# Patient Record
Sex: Female | Born: 1980
Health system: Southern US, Community
[De-identification: ages and names within clinical notes are randomized; demographics above are authoritative.]

## PROBLEM LIST (undated history)

## (undated) ENCOUNTER — Inpatient Hospital Stay (HOSPITAL_COMMUNITY): Payer: Medicare Other

## (undated) ENCOUNTER — Inpatient Hospital Stay (HOSPITAL_COMMUNITY): Payer: Self-pay

## (undated) DIAGNOSIS — N63 Unspecified lump in unspecified breast: Secondary | ICD-10-CM

## (undated) DIAGNOSIS — L732 Hidradenitis suppurativa: Secondary | ICD-10-CM

## (undated) DIAGNOSIS — N920 Excessive and frequent menstruation with regular cycle: Secondary | ICD-10-CM

## (undated) DIAGNOSIS — D649 Anemia, unspecified: Secondary | ICD-10-CM

## (undated) DIAGNOSIS — D219 Benign neoplasm of connective and other soft tissue, unspecified: Secondary | ICD-10-CM

## (undated) DIAGNOSIS — H8110 Benign paroxysmal vertigo, unspecified ear: Secondary | ICD-10-CM

## (undated) DIAGNOSIS — L852 Keratosis punctata (palmaris et plantaris): Secondary | ICD-10-CM

## (undated) DIAGNOSIS — L0212 Furuncle of neck: Secondary | ICD-10-CM

## (undated) HISTORY — PX: AXILLARY HIDRADENITIS EXCISION: SUR522

## (undated) HISTORY — DX: Benign neoplasm of connective and other soft tissue, unspecified: D21.9

## (undated) HISTORY — DX: Benign paroxysmal vertigo, unspecified ear: H81.10

## (undated) HISTORY — DX: Hidradenitis suppurativa: L73.2

## (undated) HISTORY — DX: Furuncle of neck: L02.12

## (undated) HISTORY — DX: Keratosis punctata (palmaris et plantaris): L85.2

## (undated) HISTORY — PX: TUBAL LIGATION: SHX77

---
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
## 1999-01-08 ENCOUNTER — Ambulatory Visit (HOSPITAL_COMMUNITY): Admission: RE | Admit: 1999-01-08 | Discharge: 1999-01-08 | Payer: Self-pay | Admitting: *Deleted

## 1999-02-23 ENCOUNTER — Other Ambulatory Visit: Admission: RE | Admit: 1999-02-23 | Discharge: 1999-02-23 | Payer: Self-pay | Admitting: Obstetrics

## 1999-02-24 ENCOUNTER — Inpatient Hospital Stay (HOSPITAL_COMMUNITY): Admission: AD | Admit: 1999-02-24 | Discharge: 1999-02-24 | Payer: Self-pay | Admitting: Obstetrics

## 1999-05-11 ENCOUNTER — Inpatient Hospital Stay (HOSPITAL_COMMUNITY): Admission: AD | Admit: 1999-05-11 | Discharge: 1999-05-13 | Payer: Self-pay | Admitting: *Deleted

## 1999-06-20 ENCOUNTER — Inpatient Hospital Stay (HOSPITAL_COMMUNITY): Admission: AD | Admit: 1999-06-20 | Discharge: 1999-06-20 | Payer: Self-pay | Admitting: *Deleted

## 2000-04-18 ENCOUNTER — Emergency Department (HOSPITAL_COMMUNITY): Admission: EM | Admit: 2000-04-18 | Discharge: 2000-04-18 | Payer: Self-pay | Admitting: Emergency Medicine

## 2001-01-30 ENCOUNTER — Emergency Department (HOSPITAL_COMMUNITY): Admission: EM | Admit: 2001-01-30 | Discharge: 2001-01-30 | Payer: Self-pay | Admitting: Emergency Medicine

## 2001-02-01 ENCOUNTER — Emergency Department (HOSPITAL_COMMUNITY): Admission: EM | Admit: 2001-02-01 | Discharge: 2001-02-01 | Payer: Self-pay | Admitting: Emergency Medicine

## 2001-02-14 ENCOUNTER — Inpatient Hospital Stay (HOSPITAL_COMMUNITY): Admission: AD | Admit: 2001-02-14 | Discharge: 2001-02-14 | Payer: Self-pay | Admitting: *Deleted

## 2001-03-27 ENCOUNTER — Inpatient Hospital Stay (HOSPITAL_COMMUNITY): Admission: AD | Admit: 2001-03-27 | Discharge: 2001-03-27 | Payer: Self-pay | Admitting: Obstetrics

## 2001-03-29 ENCOUNTER — Inpatient Hospital Stay (HOSPITAL_COMMUNITY): Admission: AD | Admit: 2001-03-29 | Discharge: 2001-03-29 | Payer: Self-pay | Admitting: Obstetrics

## 2001-04-11 ENCOUNTER — Inpatient Hospital Stay (HOSPITAL_COMMUNITY): Admission: AD | Admit: 2001-04-11 | Discharge: 2001-04-11 | Payer: Self-pay | Admitting: *Deleted

## 2001-05-02 ENCOUNTER — Encounter: Admission: RE | Admit: 2001-05-02 | Discharge: 2001-05-02 | Payer: Self-pay | Admitting: Obstetrics & Gynecology

## 2001-08-23 ENCOUNTER — Inpatient Hospital Stay (HOSPITAL_COMMUNITY): Admission: AD | Admit: 2001-08-23 | Discharge: 2001-08-23 | Payer: Self-pay | Admitting: Obstetrics and Gynecology

## 2001-08-29 ENCOUNTER — Inpatient Hospital Stay (HOSPITAL_COMMUNITY): Admission: AD | Admit: 2001-08-29 | Discharge: 2001-08-29 | Payer: Self-pay | Admitting: *Deleted

## 2001-09-13 ENCOUNTER — Inpatient Hospital Stay (HOSPITAL_COMMUNITY): Admission: AD | Admit: 2001-09-13 | Discharge: 2001-09-13 | Payer: Self-pay | Admitting: Obstetrics and Gynecology

## 2002-05-07 ENCOUNTER — Other Ambulatory Visit: Admission: RE | Admit: 2002-05-07 | Discharge: 2002-05-07 | Payer: Self-pay | Admitting: Family Medicine

## 2002-11-28 ENCOUNTER — Inpatient Hospital Stay (HOSPITAL_COMMUNITY): Admission: AD | Admit: 2002-11-28 | Discharge: 2002-11-28 | Payer: Self-pay | Admitting: Obstetrics and Gynecology

## 2003-07-03 ENCOUNTER — Emergency Department (HOSPITAL_COMMUNITY): Admission: EM | Admit: 2003-07-03 | Discharge: 2003-07-03 | Payer: Self-pay | Admitting: Emergency Medicine

## 2003-07-25 ENCOUNTER — Other Ambulatory Visit: Admission: RE | Admit: 2003-07-25 | Discharge: 2003-07-25 | Payer: Self-pay | Admitting: Family Medicine

## 2003-09-01 ENCOUNTER — Emergency Department (HOSPITAL_COMMUNITY): Admission: EM | Admit: 2003-09-01 | Discharge: 2003-09-01 | Payer: Self-pay | Admitting: Emergency Medicine

## 2003-09-24 ENCOUNTER — Encounter (INDEPENDENT_AMBULATORY_CARE_PROVIDER_SITE_OTHER): Payer: Self-pay | Admitting: Specialist

## 2003-09-24 ENCOUNTER — Other Ambulatory Visit: Admission: RE | Admit: 2003-09-24 | Discharge: 2003-09-24 | Payer: Self-pay | Admitting: Obstetrics & Gynecology

## 2003-09-24 ENCOUNTER — Encounter (INDEPENDENT_AMBULATORY_CARE_PROVIDER_SITE_OTHER): Payer: Self-pay | Admitting: *Deleted

## 2003-09-24 ENCOUNTER — Encounter: Admission: RE | Admit: 2003-09-24 | Discharge: 2003-09-24 | Payer: Self-pay | Admitting: Obstetrics and Gynecology

## 2004-02-09 ENCOUNTER — Inpatient Hospital Stay (HOSPITAL_COMMUNITY): Admission: AD | Admit: 2004-02-09 | Discharge: 2004-02-09 | Payer: Self-pay | Admitting: Family Medicine

## 2004-02-16 ENCOUNTER — Inpatient Hospital Stay (HOSPITAL_COMMUNITY): Admission: AD | Admit: 2004-02-16 | Discharge: 2004-02-16 | Payer: Self-pay | Admitting: Family Medicine

## 2004-02-24 ENCOUNTER — Inpatient Hospital Stay (HOSPITAL_COMMUNITY): Admission: AD | Admit: 2004-02-24 | Discharge: 2004-02-24 | Payer: Self-pay | Admitting: Obstetrics & Gynecology

## 2004-09-22 ENCOUNTER — Ambulatory Visit: Payer: Self-pay | Admitting: Obstetrics and Gynecology

## 2004-10-08 ENCOUNTER — Inpatient Hospital Stay (HOSPITAL_COMMUNITY): Admission: AD | Admit: 2004-10-08 | Discharge: 2004-10-09 | Payer: Self-pay | Admitting: Obstetrics & Gynecology

## 2004-11-20 ENCOUNTER — Emergency Department (HOSPITAL_COMMUNITY): Admission: EM | Admit: 2004-11-20 | Discharge: 2004-11-20 | Payer: Self-pay | Admitting: Emergency Medicine

## 2004-11-22 ENCOUNTER — Emergency Department (HOSPITAL_COMMUNITY): Admission: EM | Admit: 2004-11-22 | Discharge: 2004-11-22 | Payer: Self-pay | Admitting: Family Medicine

## 2004-12-16 ENCOUNTER — Ambulatory Visit: Payer: Self-pay | Admitting: Family Medicine

## 2004-12-19 ENCOUNTER — Emergency Department (HOSPITAL_COMMUNITY): Admission: EM | Admit: 2004-12-19 | Discharge: 2004-12-19 | Payer: Self-pay | Admitting: Family Medicine

## 2005-01-04 ENCOUNTER — Emergency Department (HOSPITAL_COMMUNITY): Admission: EM | Admit: 2005-01-04 | Discharge: 2005-01-04 | Payer: Self-pay | Admitting: Family Medicine

## 2005-04-11 ENCOUNTER — Emergency Department (HOSPITAL_COMMUNITY): Admission: EM | Admit: 2005-04-11 | Discharge: 2005-04-11 | Payer: Self-pay | Admitting: Family Medicine

## 2005-04-15 ENCOUNTER — Emergency Department (HOSPITAL_COMMUNITY): Admission: EM | Admit: 2005-04-15 | Discharge: 2005-04-15 | Payer: Self-pay | Admitting: Emergency Medicine

## 2005-04-16 ENCOUNTER — Emergency Department (HOSPITAL_COMMUNITY): Admission: EM | Admit: 2005-04-16 | Discharge: 2005-04-16 | Payer: Self-pay | Admitting: Emergency Medicine

## 2005-04-18 ENCOUNTER — Emergency Department (HOSPITAL_COMMUNITY): Admission: EM | Admit: 2005-04-18 | Discharge: 2005-04-18 | Payer: Self-pay | Admitting: Emergency Medicine

## 2005-06-22 ENCOUNTER — Ambulatory Visit: Payer: Self-pay | Admitting: Family Medicine

## 2005-09-16 ENCOUNTER — Inpatient Hospital Stay (HOSPITAL_COMMUNITY): Admission: AD | Admit: 2005-09-16 | Discharge: 2005-09-16 | Payer: Self-pay | Admitting: Family Medicine

## 2006-01-26 ENCOUNTER — Ambulatory Visit: Payer: Self-pay | Admitting: Obstetrics & Gynecology

## 2006-02-11 ENCOUNTER — Emergency Department (HOSPITAL_COMMUNITY): Admission: EM | Admit: 2006-02-11 | Discharge: 2006-02-11 | Payer: Self-pay | Admitting: Family Medicine

## 2006-03-30 ENCOUNTER — Ambulatory Visit: Payer: Self-pay | Admitting: Obstetrics and Gynecology

## 2006-04-12 ENCOUNTER — Ambulatory Visit: Payer: Self-pay | Admitting: Family Medicine

## 2006-04-17 ENCOUNTER — Emergency Department (HOSPITAL_COMMUNITY): Admission: EM | Admit: 2006-04-17 | Discharge: 2006-04-17 | Payer: Self-pay | Admitting: Emergency Medicine

## 2006-04-29 ENCOUNTER — Emergency Department (HOSPITAL_COMMUNITY): Admission: EM | Admit: 2006-04-29 | Discharge: 2006-04-29 | Payer: Self-pay | Admitting: Family Medicine

## 2006-07-14 ENCOUNTER — Emergency Department (HOSPITAL_COMMUNITY): Admission: EM | Admit: 2006-07-14 | Discharge: 2006-07-14 | Payer: Self-pay | Admitting: Family Medicine

## 2006-08-04 ENCOUNTER — Inpatient Hospital Stay (HOSPITAL_COMMUNITY): Admission: AD | Admit: 2006-08-04 | Discharge: 2006-08-04 | Payer: Self-pay | Admitting: Obstetrics & Gynecology

## 2006-11-24 ENCOUNTER — Emergency Department (HOSPITAL_COMMUNITY): Admission: EM | Admit: 2006-11-24 | Discharge: 2006-11-24 | Payer: Self-pay | Admitting: Emergency Medicine

## 2006-11-29 ENCOUNTER — Emergency Department (HOSPITAL_COMMUNITY): Admission: EM | Admit: 2006-11-29 | Discharge: 2006-11-29 | Payer: Self-pay | Admitting: Emergency Medicine

## 2006-12-05 ENCOUNTER — Emergency Department (HOSPITAL_COMMUNITY): Admission: EM | Admit: 2006-12-05 | Discharge: 2006-12-05 | Payer: Self-pay | Admitting: Emergency Medicine

## 2006-12-28 ENCOUNTER — Emergency Department (HOSPITAL_COMMUNITY): Admission: EM | Admit: 2006-12-28 | Discharge: 2006-12-29 | Payer: Self-pay | Admitting: Emergency Medicine

## 2007-01-23 ENCOUNTER — Encounter (INDEPENDENT_AMBULATORY_CARE_PROVIDER_SITE_OTHER): Payer: Self-pay | Admitting: General Surgery

## 2007-01-23 ENCOUNTER — Ambulatory Visit (HOSPITAL_BASED_OUTPATIENT_CLINIC_OR_DEPARTMENT_OTHER): Admission: RE | Admit: 2007-01-23 | Discharge: 2007-01-23 | Payer: Self-pay | Admitting: General Surgery

## 2007-05-15 ENCOUNTER — Ambulatory Visit (HOSPITAL_BASED_OUTPATIENT_CLINIC_OR_DEPARTMENT_OTHER): Admission: RE | Admit: 2007-05-15 | Discharge: 2007-05-15 | Payer: Self-pay | Admitting: General Surgery

## 2007-05-15 HISTORY — PX: AXILLARY HIDRADENITIS EXCISION: SUR522

## 2007-06-04 ENCOUNTER — Emergency Department (HOSPITAL_COMMUNITY): Admission: EM | Admit: 2007-06-04 | Discharge: 2007-06-04 | Payer: Self-pay | Admitting: Emergency Medicine

## 2007-06-06 ENCOUNTER — Emergency Department (HOSPITAL_COMMUNITY): Admission: EM | Admit: 2007-06-06 | Discharge: 2007-06-06 | Payer: Self-pay | Admitting: Emergency Medicine

## 2007-10-25 ENCOUNTER — Ambulatory Visit (HOSPITAL_BASED_OUTPATIENT_CLINIC_OR_DEPARTMENT_OTHER): Admission: RE | Admit: 2007-10-25 | Discharge: 2007-10-25 | Payer: Self-pay | Admitting: General Surgery

## 2007-10-25 ENCOUNTER — Encounter (INDEPENDENT_AMBULATORY_CARE_PROVIDER_SITE_OTHER): Payer: Self-pay | Admitting: General Surgery

## 2007-11-21 ENCOUNTER — Emergency Department (HOSPITAL_COMMUNITY): Admission: EM | Admit: 2007-11-21 | Discharge: 2007-11-21 | Payer: Self-pay | Admitting: Emergency Medicine

## 2007-12-03 ENCOUNTER — Emergency Department (HOSPITAL_COMMUNITY): Admission: EM | Admit: 2007-12-03 | Discharge: 2007-12-03 | Payer: Self-pay | Admitting: Emergency Medicine

## 2008-01-08 ENCOUNTER — Emergency Department (HOSPITAL_COMMUNITY): Admission: EM | Admit: 2008-01-08 | Discharge: 2008-01-09 | Payer: Self-pay | Admitting: Emergency Medicine

## 2008-03-08 ENCOUNTER — Emergency Department (HOSPITAL_COMMUNITY): Admission: EM | Admit: 2008-03-08 | Discharge: 2008-03-08 | Payer: Self-pay | Admitting: Emergency Medicine

## 2008-04-18 ENCOUNTER — Inpatient Hospital Stay (HOSPITAL_COMMUNITY): Admission: AD | Admit: 2008-04-18 | Discharge: 2008-04-18 | Payer: Self-pay | Admitting: Family Medicine

## 2008-04-22 ENCOUNTER — Inpatient Hospital Stay (HOSPITAL_COMMUNITY): Admission: AD | Admit: 2008-04-22 | Discharge: 2008-04-22 | Payer: Self-pay | Admitting: Obstetrics & Gynecology

## 2008-05-01 ENCOUNTER — Inpatient Hospital Stay (HOSPITAL_COMMUNITY): Admission: AD | Admit: 2008-05-01 | Discharge: 2008-05-01 | Payer: Self-pay | Admitting: Obstetrics & Gynecology

## 2009-08-25 ENCOUNTER — Emergency Department (HOSPITAL_COMMUNITY): Admission: EM | Admit: 2009-08-25 | Discharge: 2009-08-26 | Payer: Self-pay | Admitting: Emergency Medicine

## 2009-09-03 ENCOUNTER — Emergency Department (HOSPITAL_COMMUNITY): Admission: EM | Admit: 2009-09-03 | Discharge: 2009-09-03 | Payer: Self-pay | Admitting: Emergency Medicine

## 2009-09-03 ENCOUNTER — Ambulatory Visit (HOSPITAL_COMMUNITY): Admission: RE | Admit: 2009-09-03 | Discharge: 2009-09-03 | Payer: Self-pay | Admitting: Emergency Medicine

## 2009-09-11 ENCOUNTER — Ambulatory Visit (HOSPITAL_BASED_OUTPATIENT_CLINIC_OR_DEPARTMENT_OTHER): Admission: RE | Admit: 2009-09-11 | Discharge: 2009-09-11 | Payer: Self-pay | Admitting: General Surgery

## 2009-09-11 HISTORY — PX: AXILLARY HIDRADENITIS EXCISION: SUR522

## 2010-03-05 ENCOUNTER — Emergency Department (HOSPITAL_COMMUNITY): Admission: EM | Admit: 2010-03-05 | Discharge: 2010-03-05 | Payer: Self-pay | Admitting: Emergency Medicine

## 2010-03-31 ENCOUNTER — Emergency Department (HOSPITAL_COMMUNITY)
Admission: EM | Admit: 2010-03-31 | Discharge: 2010-03-31 | Payer: Self-pay | Source: Home / Self Care | Admitting: Emergency Medicine

## 2010-04-02 ENCOUNTER — Emergency Department (HOSPITAL_COMMUNITY)
Admission: EM | Admit: 2010-04-02 | Discharge: 2010-04-02 | Payer: Self-pay | Source: Home / Self Care | Admitting: Emergency Medicine

## 2010-07-08 ENCOUNTER — Emergency Department (HOSPITAL_COMMUNITY)
Admission: EM | Admit: 2010-07-08 | Discharge: 2010-07-08 | Disposition: A | Discharge status: Home / Self Care | Payer: Medicaid Other | Attending: Emergency Medicine | Admitting: Emergency Medicine

## 2010-07-08 DIAGNOSIS — N390 Urinary tract infection, site not specified: Secondary | ICD-10-CM | POA: Insufficient documentation

## 2010-07-08 DIAGNOSIS — J029 Acute pharyngitis, unspecified: Secondary | ICD-10-CM | POA: Insufficient documentation

## 2010-07-08 DIAGNOSIS — R197 Diarrhea, unspecified: Secondary | ICD-10-CM | POA: Insufficient documentation

## 2010-07-08 DIAGNOSIS — A599 Trichomoniasis, unspecified: Secondary | ICD-10-CM | POA: Insufficient documentation

## 2010-07-08 DIAGNOSIS — R109 Unspecified abdominal pain: Secondary | ICD-10-CM | POA: Insufficient documentation

## 2010-07-08 DIAGNOSIS — R05 Cough: Secondary | ICD-10-CM | POA: Insufficient documentation

## 2010-07-08 DIAGNOSIS — R059 Cough, unspecified: Secondary | ICD-10-CM | POA: Insufficient documentation

## 2010-07-08 DIAGNOSIS — R1310 Dysphagia, unspecified: Secondary | ICD-10-CM | POA: Insufficient documentation

## 2010-07-08 LAB — URINE MICROSCOPIC-ADD ON

## 2010-07-08 LAB — COMPREHENSIVE METABOLIC PANEL
Chloride: 104 mEq/L (ref 96–112)
GFR calc Af Amer: >60 mL/min (ref >60)
GFR calc non Af Amer: >60 mL/min (ref >60)
Glucose, Bld: 80 mg/dL (ref 70–99)
Potassium: 4 mEq/L (ref 3.5–5.1)

## 2010-07-08 LAB — CBC
Hemoglobin: 13.2 g/dL (ref 12.0–15.0)
MCH: 28.4 pg (ref 26.0–34.0)
MCHC: 33.8 g/dL (ref 30.0–36.0)
MCV: 84.1 fL (ref 78.0–100.0)

## 2010-07-08 LAB — DIFFERENTIAL
Eosinophils Relative: 0 % (ref 0–5)
Lymphocytes Relative: 23 % (ref 12–46)
Monocytes Relative: 12 % (ref 3–12)

## 2010-07-08 LAB — URINALYSIS, ROUTINE W REFLEX MICROSCOPIC
Bilirubin Urine: NEGATIVE
Ketones, ur: NEGATIVE mg/dL
Protein, ur: NEGATIVE mg/dL
Urine Glucose, Fasting: NEGATIVE mg/dL
pH: 6 (ref 5.0–8.0)

## 2010-07-08 LAB — MONONUCLEOSIS SCREEN: Mono Screen: NEGATIVE

## 2010-07-08 LAB — WET PREP, GENITAL

## 2010-07-08 LAB — LIPASE, BLOOD: Lipase: 23 U/L (ref 11–59)

## 2010-07-09 ENCOUNTER — Emergency Department (HOSPITAL_COMMUNITY)
Admission: EM | Admit: 2010-07-09 | Discharge: 2010-07-09 | Disposition: A | Discharge status: Home / Self Care | Payer: Medicaid Other | Attending: Emergency Medicine | Admitting: Emergency Medicine

## 2010-07-09 DIAGNOSIS — R112 Nausea with vomiting, unspecified: Secondary | ICD-10-CM | POA: Insufficient documentation

## 2010-07-09 DIAGNOSIS — R109 Unspecified abdominal pain: Secondary | ICD-10-CM | POA: Insufficient documentation

## 2010-08-19 LAB — POCT I-STAT 4, (NA,K, GLUC, HGB,HCT)
HCT: 38 % (ref 36.0–46.0)
Potassium: 3.5 mEq/L (ref 3.5–5.1)

## 2010-08-19 LAB — POCT PREGNANCY, URINE: Preg Test, Ur: NEGATIVE

## 2010-09-21 ENCOUNTER — Emergency Department (HOSPITAL_COMMUNITY): Payer: Medicaid Other

## 2010-09-21 ENCOUNTER — Emergency Department (HOSPITAL_COMMUNITY)
Admission: EM | Admit: 2010-09-21 | Discharge: 2010-09-21 | Disposition: A | Discharge status: Home / Self Care | Payer: Medicaid Other | Attending: Emergency Medicine | Admitting: Emergency Medicine

## 2010-09-21 DIAGNOSIS — M25569 Pain in unspecified knee: Secondary | ICD-10-CM | POA: Insufficient documentation

## 2010-09-21 DIAGNOSIS — IMO0002 Reserved for concepts with insufficient information to code with codable children: Secondary | ICD-10-CM | POA: Insufficient documentation

## 2010-10-10 ENCOUNTER — Emergency Department (HOSPITAL_COMMUNITY)
Admission: EM | Admit: 2010-10-10 | Discharge: 2010-10-10 | Disposition: A | Discharge status: Home / Self Care | Payer: Medicaid Other | Attending: Emergency Medicine | Admitting: Emergency Medicine

## 2010-10-10 DIAGNOSIS — M25569 Pain in unspecified knee: Secondary | ICD-10-CM | POA: Insufficient documentation

## 2010-10-10 DIAGNOSIS — IMO0002 Reserved for concepts with insufficient information to code with codable children: Secondary | ICD-10-CM | POA: Insufficient documentation

## 2010-10-13 NOTE — Op Note (Signed)
NAME:  Victoria Rodriguez, Victoria Rodriguez            ACCOUNT NO.:  [id number]   MEDICAL RECORD NO.:  [id number]          PATIENT TYPE:  AMB   LOCATION:  [facility]                         FACILITY:  [facility]   PHYSICIAN:  Timothy E. Earlene Plater, M.D. DATE OF BIRTH:  04-28-1981   DATE OF PROCEDURE:  01/23/2007  DATE OF DISCHARGE:                               OPERATIVE REPORT   PREOPERATIVE DIAGNOSIS:  Chronic bilateral axillary hydradenitis.   POSTOPERATIVE DIAGNOSIS:  Chronic bilateral axillary hydradenitis.   PROCEDURE:  Extensive incision, drainage, debridement of left acute  hydradenitis, axilla.   SURGEON:  Timothy E. Earlene Plater, M.D.   ANESTHESIA:  General anesthesia.   INDICATIONS FOR PROCEDURE:  [name] is age 30 and seems otherwise  healthy.  She has had chronic recurrent hydradenitis for years.  She has  been on and off of antibiotics multiple times.  She was most recently  treated in the last two months in our office and placed on doxycycline,  which improved her conditions considerably, particularly in the right  axilla, but she is left with two draining incisions in the left axilla  and she is ready to proceed with this surgery.  She agrees and  understands.   DESCRIPTION OF PROCEDURE:  The patient is identified and taken to the  operating room and placed supine.  LMA anesthesia was provided.  The  axilla on the left is inspected, prepped and draped in the usual  fashion.  Marcaine 0.25% with epinephrine is used prior to incision.  The posterior and anterior fistulous openings did in fact communicate.  An incision was made between them.  The overlying skin excised.  The  track was debrided sharply with bone curets and parts of that tissue  were actually included with an aerobic and anaerobic cultures.  The  remaining tissue was sent to pathology.  Meanwhile bleeding was  controlled with the cautery.  The wound was dry and it was packed open.  Counts were correct.   She tolerated it well.  She  was removed to the recovery room in good  condition.   Written and verbal instructions were given to her and to her partner.  A  prescription for doxycycline 100 mg, #30, one daily and Vicodin #36, to  take as needed for pain.  She will be seen in the office in one week.      Timothy E. Earlene Plater, M.D.  Electronically Signed     TED/MEDQ  D:  01/23/2007  T:  01/24/2007  Job:  [id number]

## 2010-10-13 NOTE — Op Note (Signed)
NAME:  Victoria Rodriguez, Victoria Rodriguez NO.:  [id number]   MEDICAL RECORD NO.:  [id number]          PATIENT TYPE:  AMB   LOCATION:  [facility]                         FACILITY:  [facility]   PHYSICIAN:  Anselm Pancoast. Weatherly, M.D.DATE OF BIRTH:  1980/11/21   DATE OF PROCEDURE:  10/25/2007  DATE OF DISCHARGE:                               OPERATIVE REPORT   PREOPERATIVE DIAGNOSIS:  Recurrent hidradenitis, left axilla.   OPERATION:  Excision and primary closure of recurrent axillary left  hidradenitis.   SURGEON:  Anselm Pancoast. Zachery Dakins, M.D.   ANESTHESIA:  General.   HISTORY:  Victoria Rodriguez, a 30 year old black female, who I have seen  in Dr. Earlene Plater' illnesses, for problems with recurrent hidradenitis.  She  has had about 4 or 5 previous trips to the operating room for small  excisions in both the left and right, and I saw her back in March and  then again in May and I recommended that we excise this left area  completely and try to get primary closure.  She has had drainage in he  office and also in the OR by Dr. Earlene Plater on, I think, 3 previous  occasions.  The patient responds best to Septra DS.  We have never  actually cultured MRSA and we get multiple skin type of organisms, and  she is at a kind of quiescent stage at this time.  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  She will probably need a similar surgery on  the right at a later time.  She has got about 4 different areas and then  a lot of contraction and scarring from her previous surgeries on the  left, and I gave her a gram of vancomycin preoperatively.   PROCEDURE IN DETAIL:  She was taken to the operative suite.  The left  shoulder was placed up under a towel or kind of extended, and then we  had marked the left side and then we kind of made a fusiform ellipse  with a skin marker to excise all the previous OR sites, etc.  There is a  lot of kind of  fatty tissue, possibly breast tissue posteriorly and I  think it is related to how the skin has been closed previously.  I tried  to remove that to give her a more normal-appearing axilla.  The superior  flap was elevated first.  Did not raise a whole lot of the flap  superiorly since it has been previously operated on, but got down into  the plane right over the axillary fascia, and then inferiorly the little  areas of chronic scarring, necrotizing area were more widely elevated  and get in the same plane.  There were numerous little vessels that  required suturing with 4-0 Vicryl or cauterization, pretty wide mass of  tissue was removed. There was a little area where I was using a Gelpi,  that had a little hole in it and I trimmed a little bit more medially on  the superior flap  anteriorly.  Next I used a 10 mm flat drain that was  brought in from the anterior, inferior since it will tend to lie flatter  there, and then use 4-0 Vicryl subcuticular sutures, and then even 3 or  4 nylon sutures on the skin.  I put them triple antibiotic ointment and  also Vaseline gauze, and then 4 x 4's and have a good seal.  The patient  tolerated the procedure nicely.  She has always done poorly with pain,  and if she is not able to control it with oral pain medication, I may  keep her overnight.  I am going to keep her on the Septra DS for at  least 2 weeks, and see her back in the office on Tuesday.          ______________________________  Anselm Pancoast. Zachery Dakins, M.D.    WJW/MEDQ  D:  10/25/2007  T:  10/25/2007  Job:  [id number]

## 2010-10-13 NOTE — Op Note (Signed)
NAME:  Victoria Rodriguez, Victoria Rodriguez            ACCOUNT NO.:  [id number]   MEDICAL RECORD NO.:  [id number]          PATIENT TYPE:  AMB   LOCATION:  [facility]                         FACILITY:  [facility]   PHYSICIAN:  Timothy E. Earlene Plater, M.D. DATE OF BIRTH:  09/21/1980   DATE OF PROCEDURE:  05/15/2007  DATE OF DISCHARGE:                               OPERATIVE REPORT   PREOPERATIVE DIAGNOSIS:  Bilateral hidradenitis axillae.   POSTOPERATIVE DIAGNOSIS:  Bilateral hidradenitis axillae.   PROCEDURE:  Drainage, debridement and culture of axillae.   SURGEON:  Timothy E. Earlene Plater, M.D.   ANESTHESIA:  General.   [name] is 57, seemingly otherwise healthy.  She has had  hidradenitis of both axillae and groin areas in the past.  The left  axilla has been operated previously.  She continues to have problems  with the skin, pustular discharge, undermining and fistulae.  Because of  worsening of the right axilla, she is scheduled for surgery at this time  for drainage debridement.  She is on doxycycline.   The patient is taken to the operating room, placed supine.  LMA  anesthesia provided.  Both arms were placed in the outstretched  position.  Both axillae prepped and draped.  The right was approached  first.  Multiple fistulae were present.  These were examined, undermined  and opened.  The skin edges excised.  The fistula tracts completely  debrided with a bone curette and then the entire area cauterized.  A  culture was made prior to the cautery.   Attention was turned left side which was less involved but still  contained several fistulae which were joined, opened, excised and  debrided and then cauterized.  Dry sterile dressings applied.   She tolerated well.  Counts correct.   She already has doxycycline to take.  She is given Percocet #36 and she  will be followed in the office.      Timothy E. Earlene Plater, M.D.  Electronically Signed     TED/MEDQ  D:  05/15/2007  T:  05/16/2007  Job:  [id number]

## 2010-10-16 NOTE — Group Therapy Note (Signed)
NAME:  Victoria Rodriguez, BALCH NO.:  [id number]   MEDICAL RECORD NO.:  [id number]          PATIENT TYPE:  WOC   LOCATION:  [facility]                   FACILITY:  [facility]   PHYSICIAN:  Argentina Donovan, MD        DATE OF BIRTH:  01-17-1981   DATE OF SERVICE:  03/30/2006                                    CLINIC NOTE   The patient is a 30 year old with chronic hidradenitis suppurativa that is  still tender, although not draining at this time.  We are going to continue  her on doxycycline, refer her to General Surgery for definitive treatment.           ______________________________  Argentina Donovan, MD     PR/MEDQ  D:  03/30/2006  T:  03/31/2006  Job:  [id number]

## 2010-10-16 NOTE — Group Therapy Note (Signed)
NAME:  Victoria Rodriguez, Victoria Rodriguez NO.:  [id number]   MEDICAL RECORD NO.:  [id number]          PATIENT TYPE:  WOC   LOCATION:  [facility]                   FACILITY:  [facility]   PHYSICIAN:  Elsie Lincoln, MD      DATE OF BIRTH:  1980/08/26   DATE OF SERVICE:  01/26/2006                                    CLINIC NOTE   The patient is a 30 year old female who presents for her annual exam. Her  only complaint today is flare up of her hidradenitis suppurativa. For two  and a half weeks, she has been draining pus from her arms and has been  unable to raise her right arm over her head due to pain. She has not had any  fevers, chills or rigors. She has been under the care of a dermatologist for  this before and has tried monitoring and antibiotics which has been  unsuccessful which is not surprising. She most likely needs excision of the  area, and she now has Medicaid, so I hope to get her infection under control  and then refer to a general surgeon.   PAST MEDICAL HISTORY:  As above.   PAST SURGICAL HISTORY:  Denied.   PAST GYNECOLOGIC HISTORY:  NSVD x1, ___________ x1. Pap smear abnormality is  a high grade SIL, lost to follow up for two years, then a normal Pap smear  and Pap smear done today. No history of STDs, fibroids or ovarian cysts.   FAMILY HISTORY:  Positive for hypertension. No familial cancers.   REVIEW OF SYSTEMS:  Negative except for the areas in her groin and axilla.   MEDICATIONS:  None.   ALLERGIES:  None.   PHYSICAL EXAMINATION:  VITAL SIGNS:  Temperature 99.1, pulse 61, blood  pressure 121/79, weight 168.4 pounds, height 5 foot 3 inches.  GENERAL:  Well nourished, well developed in no apparent distress.  HEENT:  Normocephalic, atraumatic.  THYROID:  No masses.  LUNGS:  Clear to auscultation bilaterally.  HEART:  Regular rate and rhythm.  AXILLA:  Multiple knots with draining pus bilaterally, positive odor.  BREASTS:  No masses, nontender, no  lymphadenopathy above the clavicles.  ABDOMEN:  Soft, nontender, no organomegaly, no hernia.  GENITALIA:  Tanner 5. There are areas of small blocked apocrine glands that  have ruptured in the past. Vagina:  Pink, normal rugae. Cervix:  Closed,  nontender. Uterus:  Mobile and nontender. Adnexa:  No masses, nontender.  Perineum:  Intact. Urethra:  Nontender with no prolapse.  EXTREMITIES:  Nontender.   ASSESSMENT AND PLAN:  A 30 year old female for GYN exam.   1. Hidradenitis flare up. Treat with doxycycline 100 mg p.o. b.i.d. for 10      days.  2. No shaving, antiperspirant or deodorant. Keep area clean. Wash with      antibacterial soap and wear loose fitting clothing.  3. Consider referral to a general surgeon.  4. Return to clinic in a month.           ______________________________  Elsie Lincoln, MD     KL/MEDQ  D:  01/26/2006  T:  01/27/2006  Job:  [id number]

## 2010-11-17 ENCOUNTER — Emergency Department (HOSPITAL_COMMUNITY)
Admission: EM | Admit: 2010-11-17 | Discharge: 2010-11-18 | Disposition: A | Discharge status: Home / Self Care | Payer: No Typology Code available for payment source | Attending: Emergency Medicine | Admitting: Emergency Medicine

## 2010-11-17 DIAGNOSIS — M545 Low back pain, unspecified: Secondary | ICD-10-CM | POA: Insufficient documentation

## 2010-11-17 DIAGNOSIS — M542 Cervicalgia: Secondary | ICD-10-CM | POA: Insufficient documentation

## 2010-11-17 DIAGNOSIS — S139XXA Sprain of joints and ligaments of unspecified parts of neck, initial encounter: Secondary | ICD-10-CM | POA: Insufficient documentation

## 2010-11-17 DIAGNOSIS — R51 Headache: Secondary | ICD-10-CM | POA: Insufficient documentation

## 2010-11-18 ENCOUNTER — Emergency Department (HOSPITAL_COMMUNITY): Payer: No Typology Code available for payment source

## 2010-11-18 ENCOUNTER — Emergency Department (HOSPITAL_COMMUNITY)
Admission: EM | Admit: 2010-11-18 | Discharge: 2010-11-18 | Discharge status: Against Medical Advice | Payer: Medicaid Other | Attending: Emergency Medicine | Admitting: Emergency Medicine

## 2010-11-18 DIAGNOSIS — Z0389 Encounter for observation for other suspected diseases and conditions ruled out: Secondary | ICD-10-CM | POA: Insufficient documentation

## 2010-12-07 ENCOUNTER — Emergency Department (HOSPITAL_COMMUNITY)
Admission: EM | Admit: 2010-12-07 | Discharge: 2010-12-07 | Disposition: A | Discharge status: Home / Self Care | Payer: No Typology Code available for payment source | Attending: Emergency Medicine | Admitting: Emergency Medicine

## 2010-12-07 DIAGNOSIS — S139XXA Sprain of joints and ligaments of unspecified parts of neck, initial encounter: Secondary | ICD-10-CM | POA: Insufficient documentation

## 2010-12-07 DIAGNOSIS — M549 Dorsalgia, unspecified: Secondary | ICD-10-CM | POA: Insufficient documentation

## 2010-12-07 DIAGNOSIS — S335XXA Sprain of ligaments of lumbar spine, initial encounter: Secondary | ICD-10-CM | POA: Insufficient documentation

## 2010-12-07 DIAGNOSIS — M542 Cervicalgia: Secondary | ICD-10-CM | POA: Insufficient documentation

## 2010-12-07 DIAGNOSIS — R51 Headache: Secondary | ICD-10-CM | POA: Insufficient documentation

## 2011-01-17 ENCOUNTER — Emergency Department (HOSPITAL_COMMUNITY)
Admission: EM | Admit: 2011-01-17 | Discharge: 2011-01-17 | Disposition: A | Discharge status: Home / Self Care | Payer: Medicaid Other | Attending: Emergency Medicine | Admitting: Emergency Medicine

## 2011-01-17 DIAGNOSIS — S335XXA Sprain of ligaments of lumbar spine, initial encounter: Secondary | ICD-10-CM | POA: Insufficient documentation

## 2011-01-17 DIAGNOSIS — Y92410 Unspecified street and highway as the place of occurrence of the external cause: Secondary | ICD-10-CM | POA: Insufficient documentation

## 2011-01-20 ENCOUNTER — Emergency Department (HOSPITAL_COMMUNITY): Payer: Medicaid Other

## 2011-01-20 ENCOUNTER — Emergency Department (HOSPITAL_COMMUNITY)
Admission: EM | Admit: 2011-01-20 | Discharge: 2011-01-20 | Disposition: A | Discharge status: Home / Self Care | Payer: Medicaid Other | Attending: Emergency Medicine | Admitting: Emergency Medicine

## 2011-01-20 DIAGNOSIS — M25539 Pain in unspecified wrist: Secondary | ICD-10-CM | POA: Insufficient documentation

## 2011-01-20 DIAGNOSIS — M79609 Pain in unspecified limb: Secondary | ICD-10-CM | POA: Insufficient documentation

## 2011-01-20 DIAGNOSIS — S63509A Unspecified sprain of unspecified wrist, initial encounter: Secondary | ICD-10-CM | POA: Insufficient documentation

## 2011-02-24 LAB — POCT PREGNANCY, URINE
Operator id: 280881
Preg Test, Ur: NEGATIVE

## 2011-02-24 LAB — POCT I-STAT 4, (NA,K, GLUC, HGB,HCT)
Operator id: 280881
Potassium: 3.8

## 2011-03-02 LAB — URINE MICROSCOPIC-ADD ON

## 2011-03-02 LAB — WET PREP, GENITAL: Clue Cells Wet Prep HPF POC: NONE SEEN

## 2011-03-02 LAB — URINALYSIS, ROUTINE W REFLEX MICROSCOPIC
Nitrite: NEGATIVE
Specific Gravity, Urine: <1.005 — ABNORMAL LOW
Urobilinogen, UA: 0.2
pH: 6

## 2011-03-02 LAB — ABO/RH: ABO/RH(D): B POS

## 2011-03-02 LAB — CBC
Hemoglobin: 10.9 — ABNORMAL LOW
RBC: 3.84 — ABNORMAL LOW
RDW: 15.4
WBC: 5.2

## 2011-03-05 LAB — ANAEROBIC CULTURE

## 2011-03-05 LAB — URINALYSIS, ROUTINE W REFLEX MICROSCOPIC
Ketones, ur: NEGATIVE mg/dL
Leukocytes, UA: NEGATIVE
Nitrite: NEGATIVE
Specific Gravity, Urine: >1.03 — ABNORMAL HIGH (ref 1.005–1.030)
pH: 5.5 (ref 5.0–8.0)

## 2011-03-05 LAB — URINE MICROSCOPIC-ADD ON

## 2011-03-05 LAB — WOUND CULTURE
Culture: NO GROWTH
Gram Stain: NONE SEEN

## 2011-03-12 LAB — POCT PREGNANCY, URINE
Operator id: 268271
Preg Test, Ur: NEGATIVE

## 2011-03-12 LAB — WOUND CULTURE

## 2011-03-12 LAB — ANAEROBIC CULTURE

## 2011-03-12 LAB — POCT HEMOGLOBIN-HEMACUE: Operator id: 268271

## 2011-03-17 LAB — POCT URINALYSIS DIP (DEVICE)
Nitrite: NEGATIVE
Operator id: 239701

## 2011-03-17 LAB — POCT PREGNANCY, URINE: Operator id: 247071

## 2011-08-03 ENCOUNTER — Ambulatory Visit (INDEPENDENT_AMBULATORY_CARE_PROVIDER_SITE_OTHER): Payer: Medicaid Other | Admitting: General Surgery

## 2011-08-03 VITALS — BP 112/78 | HR 68 | Temp 97.2°F | Resp 16 | Ht 63.0 in | Wt 186.8 lb

## 2011-08-03 DIAGNOSIS — L732 Hidradenitis suppurativa: Secondary | ICD-10-CM

## 2011-08-03 NOTE — Progress Notes (Signed)
Patient ID: Victoria Rodriguez, female   DOB: 02/10/1981, 31 y.o.   MRN: [id number]  Chief Complaint  Patient presents with  . Follow-up    hidradenitis    HPI Victoria Rodriguez is a 31 y.o. female.   HPIThis patient is is known to our practice for previous excision of bilateral axillary hidradenitis by Dr. Zachery Dakins. She has done well since her axillary excision and now presents for evaluation and of bilateral inguinal hidradenitis. She is had this for several years and has had recurrent "boils" in the area with drainage. She has frequent flareups of infection which sounds like it's monthly occurrence. She denies any active drainage no fevers or chills or redness.  No past medical history on file.  No past surgical history on file.  No family history on file.  Social History History  Substance Use Topics  . Smoking status: Not on file  . Smokeless tobacco: Not on file  . Alcohol Use: Not on file    Allergies  Allergen Reactions  . Vicodin (Hydrocodone-Acetaminophen) Nausea Only    No current outpatient prescriptions on file.    Review of Systems Review of Systems  Blood pressure 112/78, pulse 68, temperature 97.2 F (36.2 C), temperature source Temporal, resp. rate 16, height 5\' 3"  (1.6 m), weight 186 lb 12.8 oz (84.732 kg).  Physical Exam Physical Exam Physical Exam  Nursing note and vitals reviewed. Constitutional: She is oriented to person, place, and time. She appears well-developed and well-nourished. No distress.  HENT:  Head: Normocephalic and atraumatic.  Mouth/Throat: No oropharyngeal exudate.  Eyes: Conjunctivae and EOM are normal. Pupils are equal, round, and reactive to light. Right eye exhibits no discharge. Left eye exhibits no discharge. No scleral icterus.  Neck: Normal range of motion. Neck supple. No tracheal deviation present.  Cardiovascular: Normal rate, regular rhythm, normal heart sounds and intact distal pulses.   Pulmonary/Chest: Effort  normal and breath sounds normal. No stridor. No respiratory distress. She has no wheezes.  Abdominal: Soft. Bowel sounds are normal. She exhibits no distension and no mass. There is no tenderness. There is no rebound and no guarding.  Musculoskeletal: Normal range of motion. She exhibits no edema and no tenderness.  Neurological: She is alert and oriented to person, place, and time.  Skin: Skin is warm and dry. No rash noted. She is not diaphoretic. No erythema. No pallor. She does have several areas in her inguinal regions and mons area with small skin punctum and slight amount of drainage and a slight amount of tenderness. There is no fluctuant area or sign of active infection but that of chronic infection. Psychiatric: She has a normal mood and affect. Her behavior is normal. Judgment and thought content normal.   Data Reviewed   Assessment    Inguinal hidradenitis She does have hidradenitis and evidence of chronic infection in her inguinal region and mons pubis area. There is no need for incision and drainage today in no acute infection but she has chronic infection and would like to have this managed definitively. Given the area was present in incorporating the month he was recommended plastic surgery evaluation for possible excision and treatment.    Plan    I placed a plastic surgery evaluation for possible excision       Eoin Willden DAVID 08/03/2011, 9:15 AM

## 2011-08-16 ENCOUNTER — Telehealth (INDEPENDENT_AMBULATORY_CARE_PROVIDER_SITE_OTHER): Payer: Self-pay

## 2011-08-16 NOTE — Telephone Encounter (Signed)
Referral made to [facility] w/ Dr. Odis Luster Wed 08/18/11 @ 9:15, will fax office notes to [phone].

## 2011-10-25 ENCOUNTER — Emergency Department (HOSPITAL_COMMUNITY)
Admission: EM | Admit: 2011-10-25 | Discharge: 2011-10-25 | Disposition: A | Discharge status: Home / Self Care | Payer: Medicaid Other | Source: Home / Self Care | Attending: Emergency Medicine | Admitting: Emergency Medicine

## 2011-10-25 ENCOUNTER — Encounter (HOSPITAL_COMMUNITY): Payer: Self-pay

## 2011-10-25 DIAGNOSIS — R300 Dysuria: Secondary | ICD-10-CM

## 2011-10-25 DIAGNOSIS — N898 Other specified noninflammatory disorders of vagina: Secondary | ICD-10-CM

## 2011-10-25 DIAGNOSIS — N939 Abnormal uterine and vaginal bleeding, unspecified: Secondary | ICD-10-CM

## 2011-10-25 LAB — POCT URINALYSIS DIP (DEVICE)
Glucose, UA: NEGATIVE mg/dL
Ketones, ur: NEGATIVE mg/dL
Leukocytes, UA: NEGATIVE
pH: 6 (ref 5.0–8.0)

## 2011-10-25 LAB — POCT PREGNANCY, URINE: Preg Test, Ur: NEGATIVE

## 2011-10-25 LAB — WET PREP, GENITAL

## 2011-10-25 MED ORDER — NITROFURANTOIN MONOHYD MACRO 100 MG PO CAPS: 100.0000 mg | ORAL_CAPSULE | Freq: Two times a day (BID) | ORAL | Status: AC

## 2011-10-25 NOTE — ED Provider Notes (Signed)
History     CSN: [id number]  Arrival date & time 10/25/11  1136   First MD Initiated Contact with Patient 10/25/11 1141      Chief Complaint  Patient presents with  . Dysuria    (Consider location/radiation/quality/duration/timing/severity/associated sxs/prior treatment) HPI Comments: Presents urgent care with 2 different complaints describes she's been having pressure burning discomfort with urination for 3 days. Also with multiple intermenstrual spotting to bleeding. She also requested to be tested for STDs as she has had unprotected sex. Patient denies any abdominal pain, flank pain, nausea vomiting or fevers.  Patient is a 31 y.o. female presenting with dysuria. The history is provided by the patient.  Dysuria  This is a new problem. The problem occurs every urination. The problem has not changed since onset.The pain is at a severity of 3/10. The pain is mild. There has been no fever. Associated symptoms include frequency and urgency. Pertinent negatives include no chills, no nausea, no discharge, no hematuria, no possible pregnancy and no flank pain. She has tried nothing for the symptoms. Her past medical history does not include recurrent UTIs or urinary stasis.    History reviewed. No pertinent past medical history.  History reviewed. No pertinent past surgical history.  History reviewed. No pertinent family history.  History  Substance Use Topics  . Smoking status: Not on file  . Smokeless tobacco: Not on file  . Alcohol Use:     OB History    Grav Para Term Preterm Abortions TAB SAB Ect Mult Living                  Review of Systems  Constitutional: Negative for chills and activity change.  Respiratory: Negative for shortness of breath.   Gastrointestinal: Negative for nausea.  Genitourinary: Positive for dysuria, urgency and frequency. Negative for hematuria and flank pain.    Allergies  Vicodin  Home Medications   Current Outpatient Rx  Name Route Sig  Dispense Refill  . NITROFURANTOIN MONOHYD MACRO 100 MG PO CAPS Oral Take 1 capsule (100 mg total) by mouth 2 (two) times daily. 10 capsule 0    BP 142/77  Pulse 68  Temp(Src) 99.1 F (37.3 C) (Oral)  Resp 18  SpO2 99%  Physical Exam  Nursing note and vitals reviewed. Constitutional: She appears well-developed and well-nourished.  Non-toxic appearance. She does not have a sickly appearance. She does not appear ill. No distress.  Eyes: Conjunctivae are normal.  Neck: Neck supple.  Abdominal: She exhibits no distension. There is no tenderness.  Genitourinary: There is no tenderness on the right labia. There is bleeding around the vagina. No foreign body around the vagina. Vaginal discharge found.  Musculoskeletal: She exhibits no tenderness.  Neurological: She is alert.  Skin: No rash noted.    ED Course  Procedures (including critical care time)  Labs Reviewed  POCT URINALYSIS DIP (DEVICE) - Abnormal; Notable for the following:    Bilirubin Urine SMALL (*)    Hgb urine dipstick LARGE (*)    Protein, ur 30 (*)    All other components within normal limits  POCT PREGNANCY, URINE  URINE CULTURE  WET PREP, GENITAL  GC/CHLAMYDIA PROBE AMP, GENITAL   No results found.   1. Dysuria   2. Vaginal bleeding, abnormal       MDM  Dysuria for 3 days. Also with irregular menstruation since implanted birth control implant.        Jimmie Molly, MD 10/25/11 [phone]

## 2011-10-25 NOTE — ED Notes (Signed)
C/o irregular bleeding w menses since [facility]; concerned about poss. STD (no reports from partner for STD concerns)

## 2011-10-25 NOTE — Discharge Instructions (Signed)
Discussed several things, #1 if your vaginal culture results are abnormal we will be contacting you for further treatment is necessary #2 your ongoing irregular periods are route related possibly to your birth control should discuss this further with your gynecologist    Dysuria Dysuria is the medical term for pain with urination. There are many causes for dysuria, but urinary tract infection is the most common. If a urinalysis was performed it can show that there is a urinary tract infection. A urine culture confirms that you or your child is sick. You will need to follow up with a healthcare provider because:  If a urine culture was done you will need to know the culture results and treatment recommendations.   If the urine culture was positive, you or your child will need to be put on antibiotics or know if the antibiotics prescribed are the right antibiotics for your urinary tract infection.   If the urine culture is negative (no urinary tract infection), then other causes may need to be explored or antibiotics need to be stopped.  Today laboratory work may have been done and there does not seem to be an infection. If cultures were done they will take at least 24 to 48 hours to be completed. Today x-rays may have been taken and they read as normal. No cause can be found for the problems. The x-rays may be re-read by a radiologist and you will be contacted if additional findings are made. You or your child may have been put on medications to help with this problem until you can see your primary caregiver. If the problems get better, see your primary caregiver if the problems return. If you were given antibiotics (medications which kill germs), take all of the mediations as directed for the full course of treatment.  If laboratory work was done, you need to find the results. Leave a telephone number where you can be reached. If this is not possible, make sure you find out how you are to get test  results. HOME CARE INSTRUCTIONS   Drink lots of fluids. For adults, drink eight, 8 ounce glasses of clear juice or water a day. For children, replace fluids as suggested by your caregiver.   Empty the bladder often. Avoid holding urine for long periods of time.   After a bowel movement, women should cleanse front to back, using each tissue only once.   Empty your bladder before and after sexual intercourse.   Take all the medicine given to you until it is gone. You may feel better in a few days, but TAKE ALL MEDICINE.   Avoid caffeine, tea, alcohol and carbonated beverages, because they tend to irritate the bladder.   In men, alcohol may irritate the prostate.   Only take over-the-counter or prescription medicines for pain, discomfort, or fever as directed by your caregiver.   If your caregiver has given you a follow-up appointment, it is very important to keep that appointment. Not keeping the appointment could result in a chronic or permanent injury, pain, and disability. If there is any problem keeping the appointment, you must call back to this facility for assistance.  SEEK IMMEDIATE MEDICAL CARE IF:   Back pain develops.   A fever develops.   There is nausea (feeling sick to your stomach) or vomiting (throwing up).   Problems are no better with medications or are getting worse.  MAKE SURE YOU:   Understand these instructions.   Will watch your condition.  Will get help right away if you are not doing well or get worse.  Document Released: 02/13/2004 Document Revised: 05/06/2011 Document Reviewed: 12/21/2007 [facility] Patient Information 2012 [place], [place].

## 2011-10-26 ENCOUNTER — Telehealth (HOSPITAL_COMMUNITY): Payer: Self-pay | Admitting: *Deleted

## 2011-10-26 LAB — URINE CULTURE: Culture  Setup Time: 201305271422

## 2011-10-26 LAB — GC/CHLAMYDIA PROBE AMP, GENITAL
Chlamydia, DNA Probe: NEGATIVE
GC Probe Amp, Genital: NEGATIVE

## 2011-10-26 NOTE — ED Notes (Signed)
Pt called and stated macrobid rx is making her vomit.  Rx changed to Keflex per Dr. Ladon Applebaum and called to [facility] on [address] per pt request.

## 2011-11-10 ENCOUNTER — Telehealth (HOSPITAL_COMMUNITY): Payer: Self-pay | Admitting: *Deleted

## 2011-11-10 NOTE — ED Notes (Signed)
Pt. called and said she has on Macrobid and it made her vomit. We switched her to Keflex. She has finished her antibiotics. Now c/o itching after urination.  Denies vaginal discharge.  Discussed with Dr. Ladon Applebaum and he said she should come back for a recheck.  Pt. Notified and said OK. Vassie Moselle 11/10/2011

## 2012-01-17 ENCOUNTER — Emergency Department (HOSPITAL_COMMUNITY)
Admission: EM | Admit: 2012-01-17 | Discharge: 2012-01-17 | Disposition: A | Discharge status: Home / Self Care | Payer: Medicaid Other | Source: Home / Self Care | Attending: Emergency Medicine | Admitting: Emergency Medicine

## 2012-01-17 ENCOUNTER — Encounter (HOSPITAL_COMMUNITY): Payer: Self-pay | Admitting: Emergency Medicine

## 2012-01-17 DIAGNOSIS — L259 Unspecified contact dermatitis, unspecified cause: Secondary | ICD-10-CM

## 2012-01-17 DIAGNOSIS — L309 Dermatitis, unspecified: Secondary | ICD-10-CM

## 2012-01-17 MED ORDER — SALICYLIC ACID 17 % EX SOLN: Freq: Every day | CUTANEOUS | Status: DC

## 2012-01-17 NOTE — ED Provider Notes (Signed)
Chief Complaint  Patient presents with  . Toe Pain    History of Present Illness:   The patient is a 31 year old female who's had a one to two-week history of keratotic papules on both of her hands on the in the flexural creases and also of the left great toe and the flexural crease. in the flexural creases and also on the left great toe in the flexural crease. Some of these are painful to touch. If she scratches them she can pull out a keratotic plug and this will leave a small hole. They're mildly pruritic, sometimes painful, and she has no rash elsewhere on her body. She denies any recent bites or stings. Air but no obvious contactants she has no genital lesions or ulcerations. No fever or generalized skin rash.  Review of Systems:  Other than noted above, the patient denies any of the following symptoms: Systemic:  No fever, chills, sweats, weight loss, or fatigue. ENT:  No nasal congestion, rhinorrhea, sore throat, swelling of lips, tongue or throat. Resp:  No cough, wheezing, or shortness of breath. Skin:  No rash, itching, nodules, or suspicious lesions.  PMFSH:  Past medical history, family history, social history, meds, and allergies were reviewed.  Physical Exam:   Vital signs:  BP 137/79  Pulse 82  Temp 98.5 F (36.9 C) (Oral)  Resp 16  SpO2 100% Gen:  Alert, oriented, in no distress. ENT:  Pharynx clear, no intraoral lesions, moist mucous membranes. Lungs:  Clear to auscultation. Skin:  She has multiple, tiny, keratotic papules on the palms of both hands in the flexural creases and also in the left great toe and the flexural crease. These were mildly tender to palpation, especially lesion on the toe, she has no other rash elsewhere on her body.   Multiple small, keratotic papules on both hands.     Assessment:  The encounter diagnosis was Dermatitis. I am uncertain as to the cause of this rash. Certainly rashes on the palms and soles make one think of a syphilis, so an  RPR was obtained. She will call back tomorrow to get the results. In the meantime she'll use salicylic acid solution to apply topically. If no better in a week suggested she followup with Dr. Terri Piedra.  Plan:   1.  The following meds were prescribed:   New Prescriptions   SALICYLIC ACID-LACTIC ACID 17 % EXTERNAL SOLUTION    Apply topically daily.   2.  The patient was instructed in symptomatic care and handouts were given. 3.  The patient was told to return if becoming worse in any way, if no better in 3 or 4 days, and given some red flag symptoms that would indicate earlier return.     Reuben Likes, MD 01/17/12 [phone]

## 2012-01-17 NOTE — ED Notes (Signed)
Instructions not available, physician discussing case with peers

## 2012-01-17 NOTE — ED Notes (Signed)
Reports something hard on bottom of foot, at the base of great toe.  When walking feels sore.  Onset 3 weeks ago

## 2012-01-18 LAB — RPR: RPR Ser Ql: NONREACTIVE

## 2012-02-23 ENCOUNTER — Ambulatory Visit (INDEPENDENT_AMBULATORY_CARE_PROVIDER_SITE_OTHER): Payer: Medicaid Other | Admitting: Obstetrics & Gynecology

## 2012-02-23 ENCOUNTER — Encounter: Payer: Self-pay | Admitting: Obstetrics & Gynecology

## 2012-02-23 VITALS — BP 117/71 | HR 69 | Temp 98.3°F | Ht 63.0 in | Wt 187.8 lb

## 2012-02-23 DIAGNOSIS — Z113 Encounter for screening for infections with a predominantly sexual mode of transmission: Secondary | ICD-10-CM

## 2012-02-23 NOTE — Progress Notes (Signed)
  Subjective:    Patient ID: Victoria Rodriguez, female    DOB: April 26, 1981, 31 y.o.   MRN: [id number]  HPI  31 yo S AA lady who comes here with the complaint of right upper back pain for several months. She would also like to be tested for GC/CT, although she denies a h/o infection.  Review of Systems   She [facility] Objective:   Physical Exam  No CVAT Normal vaginal discharge and cervix. No CMT. NSSA, NT, no adnexal masses      Assessment & Plan:  Desire for testing. GC/CT sent I will refer her to a FP for evaluation of her back pain

## 2012-02-24 LAB — GC/CHLAMYDIA PROBE AMP, GENITAL: Chlamydia, DNA Probe: NEGATIVE

## 2012-02-29 ENCOUNTER — Encounter: Payer: Self-pay | Admitting: Family Medicine

## 2012-02-29 ENCOUNTER — Ambulatory Visit (INDEPENDENT_AMBULATORY_CARE_PROVIDER_SITE_OTHER): Payer: Medicaid Other | Admitting: Family Medicine

## 2012-02-29 VITALS — BP 138/76 | HR 62 | Temp 98.6°F | Ht 63.0 in | Wt 186.6 lb

## 2012-02-29 DIAGNOSIS — O99213 Obesity complicating pregnancy, third trimester: Secondary | ICD-10-CM | POA: Insufficient documentation

## 2012-02-29 DIAGNOSIS — M62838 Other muscle spasm: Secondary | ICD-10-CM

## 2012-02-29 DIAGNOSIS — L852 Keratosis punctata (palmaris et plantaris): Secondary | ICD-10-CM

## 2012-02-29 DIAGNOSIS — T148XXA Other injury of unspecified body region, initial encounter: Secondary | ICD-10-CM

## 2012-02-29 DIAGNOSIS — L851 Acquired keratosis [keratoderma] palmaris et plantaris: Secondary | ICD-10-CM

## 2012-02-29 DIAGNOSIS — Z23 Encounter for immunization: Secondary | ICD-10-CM

## 2012-02-29 DIAGNOSIS — E669 Obesity, unspecified: Secondary | ICD-10-CM

## 2012-02-29 HISTORY — DX: Keratosis punctata (palmaris et plantaris): L85.2

## 2012-02-29 LAB — LIPID PANEL
HDL: 38 mg/dL — ABNORMAL LOW (ref >39)
LDL Cholesterol: 87 mg/dL (ref 0–99)
Total CHOL/HDL Ratio: 3.7 Ratio
Triglycerides: 75 mg/dL (ref <150)

## 2012-02-29 LAB — COMPREHENSIVE METABOLIC PANEL
AST: 13 U/L (ref 0–37)
Alkaline Phosphatase: 70 U/L (ref 39–117)
BUN: 8 mg/dL (ref 6–23)
Calcium: 9.2 mg/dL (ref 8.4–10.5)
Creat: 0.53 mg/dL (ref 0.50–1.10)
Glucose, Bld: 83 mg/dL (ref 70–99)

## 2012-02-29 NOTE — Addendum Note (Signed)
Addended by: Jimmy Footman K on: 02/29/2012 11:58 AM   Modules accepted: Orders

## 2012-02-29 NOTE — Patient Instructions (Addendum)
Will send you a letter on your labs for cholesterol and blood sugar and kidney and liver tests  Important to strengthen core to prevent recurrent muscle strains- can use ibuprofen 600 mg every 6 hours, warm soaks, stretching when flares up  See info on pits in your hands- how to treat  You got yourflu and Tdap shots today (tetanus and pertussis)  Follow-up yearly for your physical

## 2012-02-29 NOTE — Progress Notes (Signed)
  Subjective:    Patient ID: Victoria Rodriguez, female    DOB: [birth year], 31 y.o.   MRN: [id number]  HPI New patient here to establish care- referred by [facility]  Side pain: off and one for the past month.  Described as deep pressure on left side.  No known triggers.  Feels more when she is laying down or sitting for prolonged periods of time.  Has taken motrin with little help.  Relieved with rest.  Seems to be related to back pain per patient- has chronic off and on pain.    Has never been evaluated for this before.   No abdominal pain, nausea, vomiting, diarrhea, constipation.  No dysuria, vaginal discharge.   Not physically active.  May walk dog a few times a week.  Hands rash: Spots present for 2 months.  Some  are tender.  Not itchy.  No new cosmetics or medicines.  Also feels like she has a similar spot under her big toe.  Was evaluated by [facility] and gave salicylic acid gel which did not help.    Review of Systems Patient Information Form: Screening and ROS  AUDIT-C Score: 2 Do you feel safe in relationships? yes PHQ-2:negative  Review of Symptoms  General:  Negative for nexplained weight loss, fever Skin: Negative for new or changing mole, sore that won't heal HEENT: Negative for trouble hearing, trouble seeing, ringing in ears, mouth sores, hoarseness, change in voice, dysphagia. CV:  Negative for chest pain, dyspnea, edema, palpitations Resp: Negative for cough, dyspnea, hemoptysis GI: Negative for nausea, vomiting, diarrhea, constipation, abdominal pain, melena, hematochezia. GU: Negative for dysuria, incontinence, urinary hesitance, hematuria, vaginal or penile discharge, polyuria, sexual difficulty, lumps in testicle or breasts MSK: Negative for muscle cramps or aches, joint pain or swelling Neuro: Negative for headaches, weakness, numbness, dizziness, passing out/fainting Psych: Negative for depression, anxiety, memory problems  Positive for  headaches, frequent urination    Objective:   Physical Exam GEN: Alert & Oriented, No acute distress HEENT: [place]/AT. EOMI, PERRLA, no conjunctival injection or scleral icterus.  Bilateral tympanic membranes intact without erythema or effusion.  .  Nares without edema or rhinorrhea.  Oropharynx is without erythema or exudates.  No anterior or posterior cervical lymphadenopathy. CV:  Regular Rate & Rhythm, no murmur Respiratory:  Normal work of breathing, CTAB Abd:  + BS, soft, no tenderness to palpation Ext: no pre-tibial edema Skin: pits in palmar creases bilaterally.  One small callous underneath right great toe consistent with possible plantar wart- does not appear the same as the pits in her hands         Assessment & Plan:

## 2012-02-29 NOTE — Assessment & Plan Note (Signed)
Intermittent side pain by history and exam most consistent with muscle strain.  Not currently symptomatic.  Advised ibuprofen, stretching,w arm soaks when flares.  Gave HEP handout, encouraged increased physical activity.  Advised if flares more often and interrupt life, would consider physical therapy.

## 2012-02-29 NOTE — Assessment & Plan Note (Signed)
Encouraged increased physical activity.  WIll check Lipid, DM for positive family history.

## 2012-02-29 NOTE — Assessment & Plan Note (Signed)
advised can be normal racial variant.  Gave her edu handout on this.  Since symptomatic for patient, she can try some salicylic acid solution she already has at home.  reasssured self-limited course.

## 2012-03-01 ENCOUNTER — Encounter: Payer: Self-pay | Admitting: Family Medicine

## 2012-03-28 ENCOUNTER — Ambulatory Visit (INDEPENDENT_AMBULATORY_CARE_PROVIDER_SITE_OTHER): Payer: Medicaid Other | Admitting: Family Medicine

## 2012-03-28 DIAGNOSIS — R3989 Other symptoms and signs involving the genitourinary system: Secondary | ICD-10-CM | POA: Insufficient documentation

## 2012-03-28 DIAGNOSIS — N939 Abnormal uterine and vaginal bleeding, unspecified: Secondary | ICD-10-CM

## 2012-03-28 DIAGNOSIS — N926 Irregular menstruation, unspecified: Secondary | ICD-10-CM

## 2012-03-28 DIAGNOSIS — R198 Other specified symptoms and signs involving the digestive system and abdomen: Secondary | ICD-10-CM

## 2012-03-28 LAB — POCT URINE PREGNANCY: Preg Test, Ur: NEGATIVE

## 2012-03-28 NOTE — Patient Instructions (Addendum)
Victoria Rodriguez,  Thank you for coming in today.  I believe this is your hidradenitis again.   I will call with your lab results.   Please continue warm compresses and baths. Avoid sexual contact until we have lab results.   Dr. Armen Pickup   Hidradenitis Suppurativa, Sweat Gland Abscess Hidradenitis suppurativa is a long lasting (chronic), uncommon disease of the sweat glands. With this, boil-like lumps and scarring develop in the groin, some times under the arms (axillae), and under the breasts. It may also uncommonly occur behind the ears, in the crease of the buttocks, and around the genitals.  CAUSES  The cause is from a blocking of the sweat glands. They then become infected. It may cause drainage and odor. It is not contagious. So it cannot be given to someone else. It most often shows up in puberty (about 58 to 32 years of age). But it may happen much later. It is similar to acne which is a disease of the sweat glands. This condition is slightly more common in African-Americans and women. SYMPTOMS   Hidradenitis usually starts as one or more red, tender, swellings in the groin or under the arms (axilla).  Over a period of hours to days the lesions get larger. They often open to the skin surface, draining clear to yellow-colored fluid.  The infected area heals with scarring. DIAGNOSIS  Your caregiver makes this diagnosis by looking at you. Sometimes cultures (growing germs on plates in the lab) may be taken. This is to see what germ (bacterium) is causing the infection.  TREATMENT   Topical germ killing medicine applied to the skin (antibiotics) are the treatment of choice. Antibiotics taken by mouth (systemic) are sometimes needed when the condition is getting worse or is severe.  Avoid tight-fitting clothing which traps moisture in.  Dirt does not cause hidradenitis and it is not caused by poor hygiene.  Involved areas should be cleaned daily using an antibacterial soap. Some  patients find that the liquid form of Lever 2000, applied to the involved areas as a lotion after bathing, can help reduce the odor related to this condition.  Sometimes surgery is needed to drain infected areas or remove scarred tissue. Removal of large amounts of tissue is used only in severe cases.  Birth control pills may be helpful.  Oral retinoids (vitamin A derivatives) for 6 to 12 months which are effective for acne may also help this condition.  Weight loss will improve but not cure hidradenitis. It is made worse by being overweight. But the condition is not caused by being overweight.  This condition is more common in people who have had acne.  It may become worse under stress. There is no medical cure for hidradenitis. It can be controlled, but not cured. The condition usually continues for years with periods of getting worse and getting better (remission). Document Released: 12/30/2003 Document Revised: 08/09/2011 Document Reviewed: 01/15/2008 [facility] Patient Information 2013 [place], [place].

## 2012-03-30 NOTE — Assessment & Plan Note (Signed)
A: I believe this is your hidradenitis again. No surrounding cellulitis so antibiotics not needed at this time. HSV swab done to rule out genital herpes.  P:  I will call with your lab results.  Please continue warm compresses and baths. Avoid sexual contact until we have lab results.

## 2012-03-30 NOTE — Progress Notes (Signed)
Subjective:     Patient ID: Victoria Rodriguez, female   DOB: 03-23-1981, 30 y.o.   MRN: [id number]  HPI 31 yo F with history of hidradenitis in bilateral axilla and groin s/p axillary sweat gland dissection presents with L groin papules  x 3 weeks. The papule ruptured and started draining this weekend. She is treating with warm compress and baths which offer relief from moderate pain. She denies fever. She is sexually active with one partner. She denies vaginal discharge.   Review of Systems As per HPI     Objective:   Physical Exam There were no vitals taken for this visit. General appearance: alert, cooperative and no distress Skin: 3 5x5 mm papules L mons with erythematous stop and scant serosanguineous drainage. No ingulinal lymphadenopathy. No surrounding skin erythema, induration or streaking.  Lymph nodes: Inguinal adenopathy: negative.      Assessment and Plan:

## 2012-04-18 ENCOUNTER — Ambulatory Visit (INDEPENDENT_AMBULATORY_CARE_PROVIDER_SITE_OTHER): Payer: Medicaid Other | Admitting: Family Medicine

## 2012-04-18 ENCOUNTER — Encounter: Payer: Self-pay | Admitting: Family Medicine

## 2012-04-18 VITALS — BP 129/73 | HR 74 | Temp 98.4°F | Wt 184.0 lb

## 2012-04-18 DIAGNOSIS — L732 Hidradenitis suppurativa: Secondary | ICD-10-CM

## 2012-04-18 DIAGNOSIS — L0292 Furuncle, unspecified: Secondary | ICD-10-CM

## 2012-04-18 HISTORY — DX: Hidradenitis suppurativa: L73.2

## 2012-04-18 MED ORDER — DOXYCYCLINE HYCLATE 100 MG PO TABS: 100.0000 mg | ORAL_TABLET | Freq: Two times a day (BID) | ORAL | Status: DC

## 2012-04-18 NOTE — Progress Notes (Signed)
  Subjective:    Patient ID: Victoria Rodriguez, female    DOB: [birth year], 31 y.o.   MRN: [id number]  HPI Here for evaluation of boils in groin area  History of extensive hidradenitis in axilla, surgical revmoed.  That has been doing well, has noted intermittent boils in groin area.  Uses dial soap daily.  NO fever, chills, pus drainage.   Review of Systems See hpi    Objective:   Physical Exam GEN: NAD Groinl;  Several tender  Nodule in mons in hair bearing area.  Small Tender nodule in left groin, no abscess or fluctuance.       Assessment & Plan:

## 2012-04-18 NOTE — Assessment & Plan Note (Signed)
Exam today consistent with boils.  Will rx two weeks of doxycycline, advised may try Hibiclens to reduce colonization given her extensive history of hidradenitis.

## 2012-04-18 NOTE — Patient Instructions (Addendum)
May try surgical soap- hibiclens to see if it can rpevent it from coming back.  It is in a green bottle- if you cannot find- ask your pharmacist

## 2012-05-12 ENCOUNTER — Emergency Department (HOSPITAL_COMMUNITY)
Admission: EM | Admit: 2012-05-12 | Discharge: 2012-05-12 | Disposition: A | Discharge status: Home / Self Care | Payer: Medicaid Other | Attending: Emergency Medicine | Admitting: Emergency Medicine

## 2012-05-12 ENCOUNTER — Encounter (HOSPITAL_COMMUNITY): Payer: Self-pay | Admitting: Emergency Medicine

## 2012-05-12 ENCOUNTER — Emergency Department (HOSPITAL_COMMUNITY): Payer: Medicaid Other

## 2012-05-12 DIAGNOSIS — Z8639 Personal history of other endocrine, nutritional and metabolic disease: Secondary | ICD-10-CM | POA: Insufficient documentation

## 2012-05-12 DIAGNOSIS — Y92410 Unspecified street and highway as the place of occurrence of the external cause: Secondary | ICD-10-CM | POA: Insufficient documentation

## 2012-05-12 DIAGNOSIS — Z79899 Other long term (current) drug therapy: Secondary | ICD-10-CM | POA: Insufficient documentation

## 2012-05-12 DIAGNOSIS — M542 Cervicalgia: Secondary | ICD-10-CM | POA: Insufficient documentation

## 2012-05-12 DIAGNOSIS — Y9389 Activity, other specified: Secondary | ICD-10-CM | POA: Insufficient documentation

## 2012-05-12 DIAGNOSIS — Z862 Personal history of diseases of the blood and blood-forming organs and certain disorders involving the immune mechanism: Secondary | ICD-10-CM | POA: Insufficient documentation

## 2012-05-12 DIAGNOSIS — R6884 Jaw pain: Secondary | ICD-10-CM | POA: Insufficient documentation

## 2012-05-12 MED ORDER — NAPROXEN 500 MG PO TABS: 500.0000 mg | ORAL_TABLET | Freq: Two times a day (BID) | ORAL | Status: DC

## 2012-05-12 MED ORDER — CYCLOBENZAPRINE HCL 10 MG PO TABS: 10.0000 mg | ORAL_TABLET | Freq: Two times a day (BID) | ORAL | Status: DC | PRN
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
## 2012-05-24 ENCOUNTER — Emergency Department (HOSPITAL_COMMUNITY)
Admission: EM | Admit: 2012-05-24 | Discharge: 2012-05-24 | Disposition: A | Discharge status: Home / Self Care | Payer: Medicaid Other | Attending: Emergency Medicine | Admitting: Emergency Medicine

## 2012-05-24 ENCOUNTER — Encounter (HOSPITAL_COMMUNITY): Payer: Self-pay | Admitting: Adult Health

## 2012-05-24 ENCOUNTER — Emergency Department (HOSPITAL_COMMUNITY): Payer: Medicaid Other

## 2012-05-24 DIAGNOSIS — S139XXA Sprain of joints and ligaments of unspecified parts of neck, initial encounter: Secondary | ICD-10-CM | POA: Insufficient documentation

## 2012-05-24 DIAGNOSIS — Y9289 Other specified places as the place of occurrence of the external cause: Secondary | ICD-10-CM | POA: Insufficient documentation

## 2012-05-24 DIAGNOSIS — Z872 Personal history of diseases of the skin and subcutaneous tissue: Secondary | ICD-10-CM | POA: Insufficient documentation

## 2012-05-24 DIAGNOSIS — Z79899 Other long term (current) drug therapy: Secondary | ICD-10-CM | POA: Insufficient documentation

## 2012-05-24 DIAGNOSIS — S161XXA Strain of muscle, fascia and tendon at neck level, initial encounter: Secondary | ICD-10-CM

## 2012-05-24 DIAGNOSIS — Y9389 Activity, other specified: Secondary | ICD-10-CM | POA: Insufficient documentation

## 2012-05-24 DIAGNOSIS — S39012A Strain of muscle, fascia and tendon of lower back, initial encounter: Secondary | ICD-10-CM

## 2012-05-24 DIAGNOSIS — S335XXA Sprain of ligaments of lumbar spine, initial encounter: Secondary | ICD-10-CM | POA: Insufficient documentation

## 2012-05-24 MED ORDER — OXYCODONE-ACETAMINOPHEN 5-325 MG PO TABS
1.0000 | ORAL_TABLET | Freq: Once | ORAL | Status: AC
  Administered 2012-05-24: 1 via ORAL
  Filled 2012-05-24: qty 1

## 2012-05-24 MED ORDER — DIAZEPAM 5 MG PO TABS
10.0000 mg | ORAL_TABLET | Freq: Once | ORAL | Status: AC
  Administered 2012-05-24: 10 mg via ORAL
  Filled 2012-05-24: qty 1
  Filled 2012-05-24: qty 2

## 2012-05-24 MED ORDER — OXYCODONE-ACETAMINOPHEN 5-325 MG PO TABS: 1.0000 | ORAL_TABLET | Freq: Four times a day (QID) | ORAL | Status: DC | PRN

## 2012-05-24 MED ORDER — DIAZEPAM 5 MG PO TABS: 5.0000 mg | ORAL_TABLET | Freq: Two times a day (BID) | ORAL | Status: DC | PRN
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
## 2012-06-04 ENCOUNTER — Encounter (HOSPITAL_COMMUNITY): Payer: Self-pay | Admitting: Nurse Practitioner

## 2012-06-04 ENCOUNTER — Emergency Department (HOSPITAL_COMMUNITY)
Admission: EM | Admit: 2012-06-04 | Discharge: 2012-06-04 | Disposition: A | Discharge status: Home / Self Care | Payer: Medicaid Other | Attending: Emergency Medicine | Admitting: Emergency Medicine

## 2012-06-04 DIAGNOSIS — Z3202 Encounter for pregnancy test, result negative: Secondary | ICD-10-CM | POA: Insufficient documentation

## 2012-06-04 DIAGNOSIS — Z87828 Personal history of other (healed) physical injury and trauma: Secondary | ICD-10-CM | POA: Insufficient documentation

## 2012-06-04 DIAGNOSIS — R112 Nausea with vomiting, unspecified: Secondary | ICD-10-CM | POA: Insufficient documentation

## 2012-06-04 DIAGNOSIS — R209 Unspecified disturbances of skin sensation: Secondary | ICD-10-CM | POA: Insufficient documentation

## 2012-06-04 DIAGNOSIS — R109 Unspecified abdominal pain: Secondary | ICD-10-CM

## 2012-06-04 DIAGNOSIS — Z872 Personal history of diseases of the skin and subcutaneous tissue: Secondary | ICD-10-CM | POA: Insufficient documentation

## 2012-06-04 DIAGNOSIS — H53149 Visual discomfort, unspecified: Secondary | ICD-10-CM | POA: Insufficient documentation

## 2012-06-04 DIAGNOSIS — R51 Headache: Secondary | ICD-10-CM

## 2012-06-04 DIAGNOSIS — R1084 Generalized abdominal pain: Secondary | ICD-10-CM | POA: Insufficient documentation

## 2012-06-04 LAB — URINALYSIS, ROUTINE W REFLEX MICROSCOPIC
Glucose, UA: NEGATIVE mg/dL
Ketones, ur: NEGATIVE mg/dL
Protein, ur: NEGATIVE mg/dL
pH: 6 (ref 5.0–8.0)

## 2012-06-04 LAB — COMPREHENSIVE METABOLIC PANEL
AST: 13 U/L (ref 0–37)
Albumin: 3.6 g/dL (ref 3.5–5.2)
Alkaline Phosphatase: 66 U/L (ref 39–117)
Chloride: 103 mEq/L (ref 96–112)
Potassium: 3.7 mEq/L (ref 3.5–5.1)
Total Bilirubin: 0.4 mg/dL (ref 0.3–1.2)
Total Protein: 7.5 g/dL (ref 6.0–8.3)

## 2012-06-04 LAB — CBC WITH DIFFERENTIAL/PLATELET
Basophils Absolute: 0 10*3/uL (ref 0.0–0.1)
Basophils Relative: 0 % (ref 0–1)
MCHC: 32.3 g/dL (ref 30.0–36.0)
Neutro Abs: 2.5 10*3/uL (ref 1.7–7.7)
Neutrophils Relative %: 53 % (ref 43–77)
Platelets: 271 10*3/uL (ref 150–400)
RDW: 13.7 % (ref 11.5–15.5)

## 2012-06-04 LAB — PREGNANCY, URINE: Preg Test, Ur: NEGATIVE

## 2012-06-04 LAB — URINE MICROSCOPIC-ADD ON

## 2012-06-04 MED ORDER — METOCLOPRAMIDE HCL 5 MG/ML IJ SOLN
10.0000 mg | Freq: Once | INTRAMUSCULAR | Status: AC
  Administered 2012-06-04: 10 mg via INTRAVENOUS
  Filled 2012-06-04: qty 2

## 2012-06-04 MED ORDER — PROMETHAZINE HCL 25 MG PO TABS: 25.0000 mg | ORAL_TABLET | Freq: Four times a day (QID) | ORAL | Status: DC | PRN

## 2012-06-04 MED ORDER — DIPHENHYDRAMINE HCL 50 MG/ML IJ SOLN
25.0000 mg | Freq: Once | INTRAMUSCULAR | Status: AC
  Administered 2012-06-04: 25 mg via INTRAVENOUS
  Filled 2012-06-04: qty 1
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
## 2012-06-05 ENCOUNTER — Ambulatory Visit (INDEPENDENT_AMBULATORY_CARE_PROVIDER_SITE_OTHER): Payer: Medicaid Other | Admitting: Family Medicine

## 2012-06-05 ENCOUNTER — Encounter: Payer: Self-pay | Admitting: Family Medicine

## 2012-06-05 VITALS — BP 138/85 | HR 62 | Temp 98.4°F | Ht 63.0 in | Wt 185.0 lb

## 2012-06-05 DIAGNOSIS — S139XXA Sprain of joints and ligaments of unspecified parts of neck, initial encounter: Secondary | ICD-10-CM

## 2012-06-05 DIAGNOSIS — L0293 Carbuncle, unspecified: Secondary | ICD-10-CM

## 2012-06-05 DIAGNOSIS — L0292 Furuncle, unspecified: Secondary | ICD-10-CM

## 2012-06-05 DIAGNOSIS — T148XXA Other injury of unspecified body region, initial encounter: Secondary | ICD-10-CM

## 2012-06-05 DIAGNOSIS — S161XXA Strain of muscle, fascia and tendon at neck level, initial encounter: Secondary | ICD-10-CM | POA: Insufficient documentation

## 2012-06-05 MED ORDER — CYCLOBENZAPRINE HCL 5 MG PO TABS: 5.0000 mg | ORAL_TABLET | Freq: Three times a day (TID) | ORAL | Status: DC | PRN

## 2012-06-05 MED ORDER — CHLORHEXIDINE GLUCONATE 4 % EX LIQD: 60.0000 mL | Freq: Every day | CUTANEOUS | Status: DC | PRN

## 2012-06-05 MED ORDER — DOXYCYCLINE HYCLATE 100 MG PO TABS: 100.0000 mg | ORAL_TABLET | Freq: Two times a day (BID) | ORAL | Status: DC
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
## 2012-06-05 NOTE — ED Provider Notes (Signed)
Medical screening examination/treatment/procedure(s) were performed by non-physician practitioner and as supervising physician I was immediately available for consultation/collaboration.   Lyanne Co, MD 06/05/12 [phone]
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
## 2012-06-07 NOTE — Assessment & Plan Note (Addendum)
Current boil is characteristic of recurrence of hydradenitis suppurativa.   - Retry prolonged course of doxycycline 100mg  BID x14 days and hibiclens daily prn - Return in 2 weeks to evaluate for improvement and consider incision & drainage if not significant improvement noted - ibuprofen and warm compresses

## 2012-06-07 NOTE — Assessment & Plan Note (Addendum)
Pt likely having neck muscle strain causing difficulty with neck ROM, back pain, and all-over headaches.  Pt states that valium given at ED did help.  DDx also includes late-presenting migraines and medication rebound headaches. - Discussed no imaging at this time given most likely MSK, with no focal neurologic deficits. - Advised to try naproxen and prescribed flexeril for severe pain.  Recommended using analgesic sparingly given possibility of medication rebound headaches.  Advised to not operate heavy machinery while using flexeril, including driving. - F/u in 2-3 weeks.

## 2012-06-08 ENCOUNTER — Other Ambulatory Visit (HOSPITAL_COMMUNITY)
Admission: RE | Admit: 2012-06-08 | Discharge: 2012-06-08 | Disposition: A | Discharge status: Home / Self Care | Payer: Medicaid Other | Source: Ambulatory Visit | Attending: Family Medicine | Admitting: Family Medicine

## 2012-06-08 ENCOUNTER — Ambulatory Visit (INDEPENDENT_AMBULATORY_CARE_PROVIDER_SITE_OTHER): Payer: Medicaid Other | Admitting: Family Medicine

## 2012-06-08 ENCOUNTER — Encounter: Payer: Self-pay | Admitting: Family Medicine

## 2012-06-08 VITALS — BP 119/70 | HR 80 | Temp 98.9°F | Ht 63.0 in | Wt 187.6 lb

## 2012-06-08 DIAGNOSIS — Z01419 Encounter for gynecological examination (general) (routine) without abnormal findings: Secondary | ICD-10-CM | POA: Insufficient documentation

## 2012-06-08 DIAGNOSIS — Z113 Encounter for screening for infections with a predominantly sexual mode of transmission: Secondary | ICD-10-CM | POA: Insufficient documentation

## 2012-06-08 DIAGNOSIS — Z1151 Encounter for screening for human papillomavirus (HPV): Secondary | ICD-10-CM | POA: Insufficient documentation

## 2012-06-08 DIAGNOSIS — Z124 Encounter for screening for malignant neoplasm of cervix: Secondary | ICD-10-CM

## 2012-06-08 DIAGNOSIS — E669 Obesity, unspecified: Secondary | ICD-10-CM

## 2012-06-08 DIAGNOSIS — N760 Acute vaginitis: Secondary | ICD-10-CM

## 2012-06-08 LAB — POCT WET PREP (WET MOUNT)

## 2012-06-08 NOTE — Assessment & Plan Note (Signed)
Pap performed today.  Screening and vaccinations UTD

## 2012-06-08 NOTE — Patient Instructions (Addendum)
Think about adding daily exercises and one diet habit you can change- for example- reducing sodas or changing to diet.  Will send you a letter with normal pap and gonorrhea/chlamydia, trichomonas results  If you  Need treatment we will call.  If your neck pain is not improving with exercises, come back for evaluation in 2-3 weeks and we will discuss physical therapy
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
## 2012-06-08 NOTE — Assessment & Plan Note (Signed)
Discussed lifestyle modification.  Had fasting labs 02/2012

## 2012-06-14 ENCOUNTER — Encounter: Payer: Self-pay | Admitting: Family Medicine

## 2012-07-19 ENCOUNTER — Ambulatory Visit (INDEPENDENT_AMBULATORY_CARE_PROVIDER_SITE_OTHER): Payer: Medicaid Other | Admitting: Family Medicine

## 2012-07-19 ENCOUNTER — Encounter: Payer: Self-pay | Admitting: Family Medicine

## 2012-07-19 VITALS — BP 117/79 | HR 80 | Temp 98.4°F | Ht 63.0 in | Wt 186.0 lb

## 2012-07-19 DIAGNOSIS — L0292 Furuncle, unspecified: Secondary | ICD-10-CM

## 2012-07-19 NOTE — Assessment & Plan Note (Signed)
Has failed doxycycline both chronically and acutely.  No evidence of cellulitis today, but same two spots in left groin continue to drain.  Will refer to surgery for consideration of excision- patient has previously done well with axillary excision

## 2012-07-19 NOTE — Patient Instructions (Addendum)
Will refer you back to surgery to see if surgery can help with these boils that keep coming back

## 2012-07-19 NOTE — Progress Notes (Signed)
  Subjective:    Patient ID: Victoria Rodriguez, female    DOB: 12-14-1980, 32 y.o.   MRN: [id number]  HPI  Here for follow-up of left inguinal hidradenitis.  Reports several episode sin the past 6 months.  Most recently has had two draining areas in the past month taht has nto healed despite Hibiclens and doxycycline.  Review of Systems See HPI    Objective:   Physical Exam GEN: Alert & Oriented, No acute distress Left groin:  Two openly draining boils, no cellulitis.  midly tender to palpation        Assessment & Plan:

## 2012-10-02 ENCOUNTER — Other Ambulatory Visit (HOSPITAL_COMMUNITY)
Admission: RE | Admit: 2012-10-02 | Discharge: 2012-10-02 | Disposition: A | Discharge status: Home / Self Care | Payer: Medicaid Other | Source: Ambulatory Visit | Attending: Family Medicine | Admitting: Family Medicine

## 2012-10-02 ENCOUNTER — Encounter: Payer: Self-pay | Admitting: Family Medicine

## 2012-10-02 ENCOUNTER — Ambulatory Visit (INDEPENDENT_AMBULATORY_CARE_PROVIDER_SITE_OTHER): Payer: Medicaid Other | Admitting: Family Medicine

## 2012-10-02 VITALS — BP 123/85 | HR 71 | Ht 63.0 in | Wt 188.0 lb

## 2012-10-02 DIAGNOSIS — M25559 Pain in unspecified hip: Secondary | ICD-10-CM

## 2012-10-02 DIAGNOSIS — A499 Bacterial infection, unspecified: Secondary | ICD-10-CM

## 2012-10-02 DIAGNOSIS — T148XXA Other injury of unspecified body region, initial encounter: Secondary | ICD-10-CM

## 2012-10-02 DIAGNOSIS — N898 Other specified noninflammatory disorders of vagina: Secondary | ICD-10-CM

## 2012-10-02 DIAGNOSIS — M25552 Pain in left hip: Secondary | ICD-10-CM

## 2012-10-02 DIAGNOSIS — B9689 Other specified bacterial agents as the cause of diseases classified elsewhere: Secondary | ICD-10-CM | POA: Insufficient documentation

## 2012-10-02 DIAGNOSIS — R350 Frequency of micturition: Secondary | ICD-10-CM

## 2012-10-02 DIAGNOSIS — N760 Acute vaginitis: Secondary | ICD-10-CM

## 2012-10-02 DIAGNOSIS — Z113 Encounter for screening for infections with a predominantly sexual mode of transmission: Secondary | ICD-10-CM | POA: Insufficient documentation

## 2012-10-02 LAB — POCT URINALYSIS DIPSTICK
Bilirubin, UA: NEGATIVE
Glucose, UA: NEGATIVE
Ketones, UA: NEGATIVE
Leukocytes, UA: NEGATIVE
Nitrite, UA: NEGATIVE
pH, UA: 7.5

## 2012-10-02 LAB — POCT UA - MICROSCOPIC ONLY: RBC, urine, microscopic: >20

## 2012-10-02 LAB — POCT WET PREP (WET MOUNT): WBC, Wet Prep HPF POC: <5

## 2012-10-02 MED ORDER — METRONIDAZOLE 500 MG PO TABS: 500.0000 mg | ORAL_TABLET | Freq: Two times a day (BID) | ORAL | Status: DC

## 2012-10-02 NOTE — Assessment & Plan Note (Signed)
Metronidazole  Bid x 7 days
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
## 2012-10-02 NOTE — Assessment & Plan Note (Signed)
No change in symptoms, no red flags.  Advised to consider physical therapy, prn treatment with OTC meds.  She will let me know if she would like physical therapy in the future.

## 2012-10-02 NOTE — Patient Instructions (Addendum)
Return if pelvic pain continues or worsens  Let me know if you would like to try physical therapy for your back pain  WIll send you a letter with your gonorrhea and chlamydia results

## 2012-10-02 NOTE — Assessment & Plan Note (Addendum)
Mild, likely menstrual n nature. U preg, U/A wnl.  Expect self limited course.  Will treat BV, await cervical cultures.  Patient to return if worsens or does not resolve.

## 2012-10-02 NOTE — Addendum Note (Signed)
Addended by: [place], Dreyah Montrose on: 10/02/2012 04:47 PM   Modules accepted: Orders

## 2012-10-03 ENCOUNTER — Encounter: Payer: Self-pay | Admitting: Family Medicine

## 2012-11-27 ENCOUNTER — Emergency Department (HOSPITAL_COMMUNITY)
Admission: EM | Admit: 2012-11-27 | Discharge: 2012-11-27 | Disposition: A | Discharge status: Home / Self Care | Payer: Medicaid Other | Source: Home / Self Care | Attending: Emergency Medicine | Admitting: Emergency Medicine

## 2012-11-27 ENCOUNTER — Emergency Department (INDEPENDENT_AMBULATORY_CARE_PROVIDER_SITE_OTHER): Payer: Medicaid Other

## 2012-11-27 ENCOUNTER — Encounter (HOSPITAL_COMMUNITY): Payer: Self-pay | Admitting: *Deleted

## 2012-11-27 DIAGNOSIS — S335XXA Sprain of ligaments of lumbar spine, initial encounter: Secondary | ICD-10-CM

## 2012-11-27 DIAGNOSIS — S39012A Strain of muscle, fascia and tendon of lower back, initial encounter: Secondary | ICD-10-CM

## 2012-11-27 LAB — POCT URINALYSIS DIP (DEVICE)
Bilirubin Urine: NEGATIVE
Glucose, UA: NEGATIVE mg/dL
Leukocytes, UA: NEGATIVE
Nitrite: NEGATIVE
Urobilinogen, UA: 0.2 mg/dL (ref 0.0–1.0)

## 2012-11-27 MED ORDER — KETOROLAC TROMETHAMINE 60 MG/2ML IM SOLN
60.0000 mg | Freq: Once | INTRAMUSCULAR | Status: AC
  Administered 2012-11-27: 60 mg via INTRAMUSCULAR

## 2012-11-27 MED ORDER — IBUPROFEN 600 MG PO TABS: 600.0000 mg | ORAL_TABLET | Freq: Four times a day (QID) | ORAL | Status: DC | PRN

## 2012-11-27 MED ORDER — KETOROLAC TROMETHAMINE 60 MG/2ML IM SOLN
INTRAMUSCULAR | Status: AC
  Filled 2012-11-27: qty 2

## 2012-11-27 MED ORDER — CYCLOBENZAPRINE HCL 10 MG PO TABS: 10.0000 mg | ORAL_TABLET | Freq: Two times a day (BID) | ORAL | Status: DC | PRN
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
## 2012-11-27 NOTE — ED Notes (Signed)
Pt  Reports  Symptoms  Of  Low  Back     Pain  X   3-4  Weeks    Pt     denys    Recent   Injury       She      denys  Any  Urinary  Symptoms     She  Reports  The  Pain is  Worse  On  Movement  And  posistion      She    Ambulated  To  Room  With a  Slow  Steady  Gait

## 2012-12-01 ENCOUNTER — Encounter (HOSPITAL_COMMUNITY): Payer: Self-pay | Admitting: Emergency Medicine

## 2012-12-01 ENCOUNTER — Emergency Department (HOSPITAL_COMMUNITY): Payer: Medicaid Other

## 2012-12-01 ENCOUNTER — Emergency Department (HOSPITAL_COMMUNITY)
Admission: EM | Admit: 2012-12-01 | Discharge: 2012-12-01 | Disposition: A | Discharge status: Home / Self Care | Payer: Medicaid Other | Attending: Emergency Medicine | Admitting: Emergency Medicine

## 2012-12-01 DIAGNOSIS — Z872 Personal history of diseases of the skin and subcutaneous tissue: Secondary | ICD-10-CM | POA: Insufficient documentation

## 2012-12-01 DIAGNOSIS — R109 Unspecified abdominal pain: Secondary | ICD-10-CM

## 2012-12-01 DIAGNOSIS — M545 Low back pain, unspecified: Secondary | ICD-10-CM | POA: Insufficient documentation

## 2012-12-01 DIAGNOSIS — Z3202 Encounter for pregnancy test, result negative: Secondary | ICD-10-CM | POA: Insufficient documentation

## 2012-12-01 LAB — URINALYSIS, ROUTINE W REFLEX MICROSCOPIC
Glucose, UA: NEGATIVE mg/dL
Ketones, ur: NEGATIVE mg/dL
Protein, ur: NEGATIVE mg/dL
pH: 7 (ref 5.0–8.0)

## 2012-12-01 LAB — URINE MICROSCOPIC-ADD ON

## 2012-12-01 MED ORDER — OXYCODONE-ACETAMINOPHEN 5-325 MG PO TABS: 1.0000 | ORAL_TABLET | Freq: Once | ORAL | Status: DC

## 2012-12-01 MED ORDER — OXYCODONE-ACETAMINOPHEN 5-325 MG PO TABS
1.0000 | ORAL_TABLET | Freq: Once | ORAL | Status: AC
  Administered 2012-12-01: 1 via ORAL
  Filled 2012-12-01: qty 1

## 2012-12-01 NOTE — ED Provider Notes (Signed)
Medical screening examination/treatment/procedure(s) were performed by non-physician practitioner and as supervising physician I was immediately available for consultation/collaboration.   Shelda Jakes, MD 12/01/12 [phone]

## 2012-12-01 NOTE — ED Provider Notes (Signed)
History    CSN: [id number] Arrival date & time 12/01/12  1034  First MD Initiated Contact with Patient 12/01/12 1110     Chief Complaint  Patient presents with  . Back Pain  . Flank Pain   (Consider location/radiation/quality/duration/timing/severity/associated sxs/prior Treatment) Patient is a 32 y.o. female presenting with back pain and flank pain. The history is provided by the patient. No language interpreter was used.  Back Pain Location:  Lumbar spine Radiates to:  L thigh Pain severity:  Moderate Associated symptoms: abdominal pain   Associated symptoms: no chest pain, no dysuria, no fever, no numbness and no weakness   Associated symptoms comment:  Low back pain for the past 4-5 weeks. Worse with movement. She reports she has been given a muscle relaxer and ibuprofen by her PCP without relief. She also states she was referred for physical therapy and that this did not improve pain either. No numbness, tingling, weakness. No bowel/bladder incontinence.  Flank Pain This is a new problem. The current episode started in the past 7 days. The problem occurs constantly. Associated symptoms include abdominal pain. Pertinent negatives include no chest pain, coughing, fever, nausea, numbness, rash, vomiting or weakness. Associated symptoms comments: Pain in left flank and LLQ x 3 days. No urinary symptoms or fever. She denies vaginal discharge or abnormal periods. .   Past Medical History  Diagnosis Date  . Hidradenitis suppurativa     surgical   Past Surgical History  Procedure Laterality Date  . Hydradenitis excision    . Axillary hidradenitis excision  2011    bilateral   Family History  Problem Relation Age of Onset  . Asthma Mother   . Diabetes Father   . Asthma Sister   . Asthma Brother   . Cancer Neg Hx   . Heart disease Neg Hx   . Stroke Neg Hx    History  Substance Use Topics  . Smoking status: Never Smoker   . Smokeless tobacco: Never Used  . Alcohol Use: Yes    OB History   Grav Para Term Preterm Abortions TAB SAB Ect Mult Living   3 1 1  0 2 2    1      Review of Systems  Constitutional: Negative for fever.  Respiratory: Negative for cough and shortness of breath.   Cardiovascular: Negative for chest pain.  Gastrointestinal: Positive for abdominal pain. Negative for nausea and vomiting.  Genitourinary: Positive for flank pain. Negative for dysuria, frequency, hematuria, vaginal bleeding, vaginal discharge and menstrual problem.  Musculoskeletal: Positive for back pain.  Skin: Negative for rash and wound.  Neurological: Negative for weakness and numbness.    Allergies  Vicodin  Home Medications   Current Outpatient Rx  Name  Route  Sig  Dispense  Refill  . cyclobenzaprine (FLEXERIL) 10 MG tablet   Oral   Take 1 tablet (10 mg total) by mouth 2 (two) times daily as needed for muscle spasms.   20 tablet   0   . etonogestrel (IMPLANON) 68 MG IMPL implant   Subcutaneous   Inject 1 each into the skin once[facility]         . ibuprofen (ADVIL,MOTRIN) 600 MG tablet   Oral   Take 1 tablet (600 mg total) by mouth every 6 (six) hours as needed for pain.   30 tablet   0   . metroNIDAZOLE (FLAGYL) 500 MG tablet   Oral   Take 1 tablet (500  mg total) by mouth 2 (two) times daily.   14 tablet   0    BP 99/84  Pulse 88  Temp(Src) 98.9 F (37.2 C) (Oral)  Resp 20  SpO2 97% Physical Exam  Constitutional: She is oriented to person, place, and time. She appears well-developed and well-nourished.  HENT:  Head: Normocephalic.  Neck: Normal range of motion. Neck supple.  Cardiovascular: Normal rate and regular rhythm.   Pulmonary/Chest: Effort normal and breath sounds normal.  Abdominal: Soft. Bowel sounds are normal. She exhibits no mass. There is tenderness. There is no rebound and no guarding.  Mild LLQ abdominal tenderness without guarding or rebound.  Genitourinary:  No CVA tenderness.    Musculoskeletal: Normal range of motion.  Mild lumbar and paralumbar tenderness without swelling or discoloration.  Neurological: She is alert and oriented to person, place, and time. She has normal reflexes. Coordination normal.  Skin: Skin is warm and dry. No rash noted.  Psychiatric: She has a normal mood and affect.    ED Course  Procedures (including critical care time) Labs Reviewed  URINALYSIS, ROUTINE W REFLEX MICROSCOPIC  PREGNANCY, URINE   Results for orders placed during the hospital encounter of 12/01/12  URINALYSIS, ROUTINE W REFLEX MICROSCOPIC      Result Value Range   Color, Urine YELLOW  YELLOW   APPearance CLEAR  CLEAR   Specific Gravity, Urine 1.012  1.005 - 1.030   pH 7.0  5.0 - 8.0   Glucose, UA NEGATIVE  NEGATIVE mg/dL   Hgb urine dipstick MODERATE (*) NEGATIVE   Bilirubin Urine NEGATIVE  NEGATIVE   Ketones, ur NEGATIVE  NEGATIVE mg/dL   Protein, ur NEGATIVE  NEGATIVE mg/dL   Urobilinogen, UA 0.2  0.0 - 1.0 mg/dL   Nitrite NEGATIVE  NEGATIVE   Leukocytes, UA NEGATIVE  NEGATIVE  PREGNANCY, URINE      Result Value Range   Preg Test, Ur NEGATIVE  NEGATIVE  URINE MICROSCOPIC-ADD ON      Result Value Range   Squamous Epithelial / LPF RARE  RARE   WBC, UA 0-2  <3 WBC/hpf   RBC / HPF 7-10  <3 RBC/hpf   Ct Abdomen Pelvis Wo Contrast  12/01/2012   *RADIOLOGY REPORT*  Clinical Data: Low back pain, hematuria  CT ABDOMEN AND PELVIS WITHOUT CONTRAST  Technique:  Multidetector CT imaging of the abdomen and pelvis was performed following the standard protocol without intravenous contrast.  Comparison: None.  Findings: Respiratory motion at the lung bases.  4-5 mm left lower lobe pulmonary nodule (series 3/image 3), likely benign.  Unenhanced liver, spleen, pancreas, and adrenal glands within normal limits.  Gallbladder is unremarkable.  No intrahepatic or extrahepatic ductal dilatation.  Kidneys are within normal limits.  No renal calculi or hydronephrosis.  No evidence  of bowel obstruction.  Duodenal diverticulum (series 2/image 35).  Normal appendix.  No evidence of abdominal aortic aneurysm.  No abdominopelvic ascites.  No suspicious abdominopelvic lymphadenopathy.  Uterus and bilateral ovaries are unremarkable.  No ureteral or bladder calculi. Bladder is within normal limits.  Calcified lower pelvic phleboliths.  Mild degenerative changes of the visualized thoracolumbar spine.  IMPRESSION: No renal, ureteral, or bladder calculi.  No hydronephrosis.  Normal appendix.  No evidence of bowel obstruction.  No CT findings to account for the patient's abdominal pain.  4-5 mm left lower lobe pulmonary nodule, poorly visualized, likely benign.  Given the patient's age, Fleischner Society guidelines do not apply.   Original Report Authenticated By:  Charline Bills, M.D.   Dg Lumbar Spine 2-3 Views  11/27/2012   *RADIOLOGY REPORT*  Clinical Data: Low back pain for 1 month  LUMBAR SPINE - 2-3 VIEW  Comparison: 05/24/2012  Findings: Five non-rib bearing lumbar vertebrae. Osseous mineralization normal. Vertebral body and disc space heights maintained. No acute fracture, subluxation or bone destruction. No spondylolysis. SI joints symmetric.  IMPRESSION: No acute osseous abnormalities.   Original Report Authenticated By: Ulyses Southward, M.D.   No results found. No diagnosis found. 1. Low back pain 2. Left flank pain MDM  Reviewed recent PCP office visit in early May - complained of and treated for low back pain going on 4-5 weeks at that time. Discussed referral for physical therapy but no notes as to definitive referral or progress notes during therapy.   Ct negative for acute findings. Symptoms will be treated symptomatically and she is referred back to PCP.  Arnoldo Hooker, PA-C 12/01/12 1439

## 2012-12-01 NOTE — ED Notes (Signed)
PA at bedside.

## 2012-12-01 NOTE — ED Notes (Signed)
Pt reports she was in [facility] in Jan. Saw a chiropractor Jan-March. Continued to have pain. Most recently has had pain for the last 3-4 weeks. States seen at urgent care and given muscle relaxers that aren't helping. Pt reports lower extremity weakness, ambulatory at triage. Denies numbness/tingling, fever, dyuria. Also c/o left flank pain. Pt alert, oriented x4, MAE, NAd at present.

## 2012-12-04 ENCOUNTER — Encounter: Payer: Self-pay | Admitting: Family Medicine

## 2012-12-04 ENCOUNTER — Ambulatory Visit (INDEPENDENT_AMBULATORY_CARE_PROVIDER_SITE_OTHER): Payer: Medicaid Other | Admitting: Family Medicine

## 2012-12-04 VITALS — BP 120/84 | HR 64 | Ht 63.0 in | Wt 190.0 lb

## 2012-12-04 DIAGNOSIS — M545 Low back pain, unspecified: Secondary | ICD-10-CM

## 2012-12-04 DIAGNOSIS — R109 Unspecified abdominal pain: Secondary | ICD-10-CM

## 2012-12-04 LAB — POCT URINALYSIS DIPSTICK
Glucose, UA: NEGATIVE
Nitrite, UA: NEGATIVE
Protein, UA: NEGATIVE
Urobilinogen, UA: 0.2

## 2012-12-04 LAB — BASIC METABOLIC PANEL
BUN: 11 mg/dL (ref 6–23)
CO2: 25 mEq/L (ref 19–32)
Calcium: 9.8 mg/dL (ref 8.4–10.5)
Chloride: 102 mEq/L (ref 96–112)
Creat: 0.6 mg/dL (ref 0.50–1.10)

## 2012-12-04 LAB — POCT URINE PREGNANCY: Preg Test, Ur: NEGATIVE

## 2012-12-04 MED ORDER — KETOROLAC TROMETHAMINE 30 MG/ML IJ SOLN
30.0000 mg | Freq: Once | INTRAMUSCULAR | Status: AC
  Administered 2012-12-04: 30 mg via INTRAMUSCULAR

## 2012-12-04 MED ORDER — TRAMADOL HCL 50 MG PO TABS: 50.0000 mg | ORAL_TABLET | Freq: Three times a day (TID) | ORAL | Status: DC | PRN

## 2012-12-04 MED ORDER — TROLAMINE SALICYLATE 10 % EX CREA: TOPICAL_CREAM | CUTANEOUS | Status: DC | PRN

## 2012-12-04 MED ORDER — KETOROLAC TROMETHAMINE 30 MG/ML IM SOLN: 30.0000 mg | Freq: Once | INTRAMUSCULAR | Status: DC

## 2012-12-04 NOTE — Assessment & Plan Note (Signed)
Recent normal L spine xray. Patient not in acute pain today. No red flags. Mostly here for flank pain.  Will treat with toradol IM in office.  Patient to return for follow up with PCP. If continues with chronic pain, PT may be beneficial.

## 2012-12-04 NOTE — Patient Instructions (Addendum)
I think you have an abdominal muscle sprain but I want to make sure you do not have a urinary tract infection. I also want to get some blood work and make sure that your kidneys are ok.  For the pain, you can apply ice in the area or icy hot patches. I am prescribing a medicine called tramadol to take for a few days.  Also, you can use a cream called asperceme which is an over the counter medicine.   If you develop weakness in your legs or numbness and tingling, please return to the clinic. If you start having fevers, chills, nausea, vomiting, please return as well.   Follow up with your primary care doctor: Dr. Armen Pickup in 1 week.

## 2012-12-04 NOTE — Progress Notes (Signed)
Patient ID: [name]    DOB: 1981-05-30, 32 y.o.   MRN: [id number] --- Subjective:  Victoria Rodriguez is a 32 y.o.female who presents with left flank pain and low back pain. - flank pain:  started Thursday, it is constant ache. Not worst with anything. Better with pain medicine. No dysuria, some increased urinary frequency. Some hematuria (patient is amenorrheic due to implanon). No fevers, no chills. No constipation or diarrhea. No nausea, no vomiting. No recent trauma or injury or change in activity.   - low back pain: chronic in nature for several months. Dull ache now. Better since flare last week. Worst with sneezing, bending over or standing for long periods of time. No lower extremity weakness. No bowel or urine incontinence. No numbness or tingling or sharp shooting radiating pain to lower extremities.   Patient was seen at urgent care on 11/27/12 and the ED on 12/01/12 for low back pain and flank pain. She had urinalysis positive for Hg and had subseuqent CT abdomen done which did not show any acute abnormality, including renal stones. She had L spine xray which was normal.   ROS: see HPI Past Medical History: reviewed and updated medications and allergies. Social History: Tobacco: none  Objective: Filed Vitals:   12/04/12 1020  BP: 120/84  Pulse: 64    Physical Examination:   General appearance - alert, well appearing, and in no distress, although does appear mildly uncomfortable shifting positions to get comfortable.  Abdomen - soft, non distended, no organomegaly appreciated. Tenderness along the left external oblique muscle, no CVA tenderness MSK - tenderness to palpation along lumbar spine and paraspinal muscles bilaterally, normal range of motion with back flexion and extension although some discomfort with back flexion.  Negative straight leg Normal knee flexion and extension bilaterally +1 patellar reflexes bilaterally   Ct abdomen and pelvis: 12/01/12 IMPRESSION:  No  renal, ureteral, or bladder calculi. No hydronephrosis.  Normal appendix. No evidence of bowel obstruction.  No CT findings to account for the patient's abdominal pain.  4-5 mm left lower lobe pulmonary nodule, poorly visualized, likely  benign. Given the patient's age, Fleischner Society guidelines do  not apply.  Lumbar spine 2-3 views: 11/27/12 Findings:  Five non-rib bearing lumbar vertebrae.  Osseous mineralization normal.  Vertebral body and disc space heights maintained.  No acute fracture, subluxation or bone destruction.  No spondylolysis.  SI joints symmetric.  IMPRESSION:  No acute osseous abnormalities.

## 2012-12-04 NOTE — Assessment & Plan Note (Signed)
Left flank pain for 5 days with extensive workup including CT abdomen negative for kidney stones or any other intraabdominal abnormality. No evidence of broken ribs.  Differential includes pyelo vs external oblique muscle sprain.  Will check UA and urine culture to rule out UTI although this appears less likely given absence of fever, dysuria, nausea or vomiting.  Will also get BMP to evaluate kidney function. (normal Cr from January 2014) Will treat for muscle sprain with toradol 30mg  IM in office. Apply aspercreme and icy hot to area. Rx for tramadol 20 tabs.  Follow up in 1 week. Red flags for return reviewed.

## 2012-12-05 LAB — URINE CULTURE
Colony Count: NO GROWTH
Organism ID, Bacteria: NO GROWTH

## 2012-12-12 ENCOUNTER — Encounter: Payer: Self-pay | Admitting: Family Medicine

## 2013-01-25 ENCOUNTER — Ambulatory Visit (INDEPENDENT_AMBULATORY_CARE_PROVIDER_SITE_OTHER): Payer: Medicaid Other | Admitting: Family Medicine

## 2013-01-25 ENCOUNTER — Encounter: Payer: Self-pay | Admitting: Family Medicine

## 2013-01-25 VITALS — BP 122/85 | HR 67 | Temp 98.4°F | Ht 63.0 in | Wt 193.0 lb

## 2013-01-25 DIAGNOSIS — L732 Hidradenitis suppurativa: Secondary | ICD-10-CM

## 2013-01-25 MED ORDER — TRAMADOL HCL 50 MG PO TABS: 50.0000 mg | ORAL_TABLET | Freq: Four times a day (QID) | ORAL | Status: DC | PRN

## 2013-01-25 MED ORDER — MUPIROCIN 2 % EX OINT: TOPICAL_OINTMENT | Freq: Three times a day (TID) | CUTANEOUS | Status: DC

## 2013-01-25 NOTE — Patient Instructions (Addendum)
  Victoria Rodriguez,  Thank you for coming in today. Please do the following: Wash with antibacterial soap-like lever 2000 daily. Soak in warm bath for at least 10 mins to promote drainage.  Use bactroban ointment. Take tramadol as needed for pain. If you develops tender swellings that do not drain, fever or worsening pain come back. Ultimately you may need to go back to surgery.   F/u with me in two weeks.   Dr. Armen Pickup   Hidradenitis Suppurativa, Sweat Gland Abscess Hidradenitis suppurativa is a long lasting (chronic), uncommon disease of the sweat glands. With this, boil-like lumps and scarring develop in the groin, some times under the arms (axillae), and under the breasts. It may also uncommonly occur behind the ears, in the crease of the buttocks, and around the genitals.  CAUSES  The cause is from a blocking of the sweat glands. They then become infected. It may cause drainage and odor. It is not contagious. So it cannot be given to someone else. It most often shows up in puberty (about 48 to 33 years of age). But it may happen much later. It is similar to acne which is a disease of the sweat glands. This condition is slightly more common in African-Americans and women. SYMPTOMS   Hidradenitis usually starts as one or more red, tender, swellings in the groin or under the arms (axilla).  Over a period of hours to days the lesions get larger. They often open to the skin surface, draining clear to yellow-colored fluid.  The infected area heals with scarring. DIAGNOSIS  Your caregiver makes this diagnosis by looking at you. Sometimes cultures (growing germs on plates in the lab) may be taken. This is to see what germ (bacterium) is causing the infection.  TREATMENT   Topical germ killing medicine applied to the skin (antibiotics) are the treatment of choice. Antibiotics taken by mouth (systemic) are sometimes needed when the condition is getting worse or is severe.  Avoid tight-fitting  clothing which traps moisture in.  Dirt does not cause hidradenitis and it is not caused by poor hygiene.  Involved areas should be cleaned daily using an antibacterial soap. Some patients find that the liquid form of Lever 2000, applied to the involved areas as a lotion after bathing, can help reduce the odor related to this condition.  Sometimes surgery is needed to drain infected areas or remove scarred tissue. Removal of large amounts of tissue is used only in severe cases.  Birth control pills may be helpful.  Oral retinoids (vitamin A derivatives) for 6 to 12 months which are effective for acne may also help this condition.  Weight loss will improve but not cure hidradenitis. It is made worse by being overweight. But the condition is not caused by being overweight.  This condition is more common in people who have had acne.  It may become worse under stress. There is no medical cure for hidradenitis. It can be controlled, but not cured. The condition usually continues for years with periods of getting worse and getting better (remission). Document Released: 12/30/2003 Document Revised: 08/09/2011 Document Reviewed: 01/15/2008 [facility] Patient Information 2014 [place], [place].

## 2013-01-26 NOTE — Assessment & Plan Note (Signed)
Assessment: Recurrent inguinal hidradenitis. Plan: Wash with antibacterial soap-like lever 2000 daily. Soak in warm bath for at least 10 mins to promote drainage.  Use bactroban ointment. Take tramadol as needed for pain. If you develop tender swellings that do not drain, fever or worsening pain come back. Ultimately you may need to go back to surgery.

## 2013-01-26 NOTE — Progress Notes (Signed)
Subjective:     Patient ID: [name], female   DOB: 01-29-1981, 32 y.o.   MRN: [id number]  HPI 32 year old female with history of hidradenitis. He is a status status post axillary hidradenitis excision presents with 2 weeks of bilateral groin pain and swelling. Her symptoms are worse on the left side. She is not a spontaneous drainage of purulent and bloody fluid. She feels that the pain is worsening. She's tried nothing at home to treat her symptoms except for ibuprofen. She denies fever, chills, nausea, vomiting.  Review of Systems As per history of present illness    Objective:   Physical Exam BP 122/85  Pulse 67  Temp(Src) 98.4 F (36.9 C) (Oral)  Ht 5\' 3"  (1.6 m)  Wt 193 lb (87.544 kg)  BMI 34.2 kg/m2 General appearance: alert, cooperative and no distress Skin: Raised indurated boils in the right groin with evidence of spontaneous drainage. No active drainage. No palpable fluctuance. No skin erythema, dimpling or streaking.    Assessment and Plan:

## 2013-03-04 ENCOUNTER — Encounter (HOSPITAL_COMMUNITY): Payer: Self-pay | Admitting: Emergency Medicine

## 2013-03-04 ENCOUNTER — Emergency Department (HOSPITAL_COMMUNITY)
Admission: EM | Admit: 2013-03-04 | Discharge: 2013-03-04 | Disposition: A | Discharge status: Home / Self Care | Payer: Medicaid Other | Attending: Emergency Medicine | Admitting: Emergency Medicine

## 2013-03-04 DIAGNOSIS — Z792 Long term (current) use of antibiotics: Secondary | ICD-10-CM | POA: Insufficient documentation

## 2013-03-04 DIAGNOSIS — IMO0002 Reserved for concepts with insufficient information to code with codable children: Secondary | ICD-10-CM | POA: Insufficient documentation

## 2013-03-04 DIAGNOSIS — L02412 Cutaneous abscess of left axilla: Secondary | ICD-10-CM

## 2013-03-04 MED ORDER — IBUPROFEN 800 MG PO TABS
800.0000 mg | ORAL_TABLET | Freq: Once | ORAL | Status: AC
  Administered 2013-03-04: 800 mg via ORAL
  Filled 2013-03-04: qty 1

## 2013-03-04 MED ORDER — CLINDAMYCIN HCL 150 MG PO CAPS
150.0000 mg | ORAL_CAPSULE | Freq: Four times a day (QID) | ORAL | Status: DC
Start: 2013-03-04 — End: 2013-05-22

## 2013-03-04 MED ORDER — LIDOCAINE-EPINEPHRINE 2 %-1:100000 IJ SOLN: 20.0000 mL | Freq: Once | INTRAMUSCULAR | Status: DC

## 2013-03-04 MED ORDER — OXYCODONE-ACETAMINOPHEN 5-325 MG PO TABS: 1.0000 | ORAL_TABLET | ORAL | Status: DC | PRN

## 2013-03-04 MED ORDER — LIDOCAINE-EPINEPHRINE 1 %-1:100000 IJ SOLN
20.0000 mL | Freq: Once | INTRAMUSCULAR | Status: AC
  Administered 2013-03-04: 20 mL
  Filled 2013-03-04: qty 1

## 2013-03-04 NOTE — ED Notes (Signed)
Pt has swollen area under left armpit. Stated that she had surgery to remove lymph nodes because she would get boils a lot.

## 2013-03-04 NOTE — ED Provider Notes (Signed)
CSN: [id number]     Arrival date & time 03/04/13  0824 History  This chart was scribed for Victoria Helper, PA working with Victoria Argyle, MD by Quintella Reichert, ED Scribe. This patient was seen in room B19C/B19C and the patient's care was started at 9:06 AM.   Chief Complaint  Patient presents with  . Arm Swelling    The history is provided by the patient. No language interpreter was used.    HPI Comments: Victoria Rodriguez is a 32 y.o. female with h/o hidradenitis suppurativa who presents to the Emergency Department complaining of a progressively-worsening painful swollen area under her left armpit that she first noticed 5 days ago.  Pt has h/o abscesses under her left armpit and has had 3 surgeries to remove them.  She states her current abscess is similar.  She has been treating the area with warm compresses but it has continued to grow larger.  She denies drainage.  She notes a throbbing, non-radiating pain localized to the area of the swelling.  Pain is worsened by moving the arm and touching the area.  She has not taken pain medications pta.  She denies fever, CP, SOB, numbness, or any other associated symptoms.   Past Medical History  Diagnosis Date  . Hidradenitis suppurativa     surgical    Past Surgical History  Procedure Laterality Date  . Hydradenitis excision    . Axillary hidradenitis excision  2011    bilateral    Family History  Problem Relation Age of Onset  . Asthma Mother   . Diabetes Father   . Asthma Sister   . Asthma Brother   . Cancer Neg Hx   . Heart disease Neg Hx   . Stroke Neg Hx     History  Substance Use Topics  . Smoking status: Never Smoker   . Smokeless tobacco: Never Used  . Alcohol Use: No    OB History   Grav Para Term Preterm Abortions TAB SAB Ect Mult Living   3 1 1  0 2 2    1       Review of Systems  Constitutional: Negative for fever.  Respiratory: Negative for shortness of breath.   Cardiovascular: Negative for chest  pain.  Skin:       Abscess  Neurological: Negative for numbness.     Allergies  Vicodin  Home Medications   Current Outpatient Rx  Name  Route  Sig  Dispense  Refill  . etonogestrel (IMPLANON) 68 MG IMPL implant   Subcutaneous   Inject 1 each into the skin once. [facility]         . ibuprofen (ADVIL,MOTRIN) 200 MG tablet   Oral   Take 400 mg by mouth every 6 (six) hours as needed for pain.         . mupirocin ointment (BACTROBAN) 2 %   Topical   Apply topically 3 (three) times daily.   22 g   0   . traMADol (ULTRAM) 50 MG tablet   Oral   Take 1 tablet (50 mg total) by mouth every 6 (six) hours as needed for pain.   30 tablet   0    BP 134/83  Pulse 100  Resp 18  SpO2 100%  Physical Exam  Nursing note and vitals reviewed. Constitutional: She appears well-developed and well-nourished. No distress.  HENT:  Head: Normocephalic and atraumatic.  Neck: Neck supple.  Cardiovascular: Normal rate, regular  rhythm and normal heart sounds.   No murmur heard. Pulmonary/Chest: Effort normal and breath sounds normal. No respiratory distress. She has no wheezes. She has no rales.  Musculoskeletal: She exhibits tenderness.  Decreased shoulder ROM secondary to pain.  Neurological: She is alert.  Skin: Skin is warm and dry. There is erythema.  Left axillary region has area of induration and fluctuance with mild erythema noted to the posterior axillary fold, with a well-healing surgical scar overlying it.  Moderate tenderness to palpation.   No evidence of lymphangitis   Psychiatric: She has a normal mood and affect. Her behavior is normal.    ED Course  Procedures (including critical care time)  DIAGNOSTIC STUDIES: Oxygen Saturation is 100% on room air, normal by my interpretation.    COORDINATION OF CARE: 9:11 AM-Discussed treatment plan which includes I&D with pt at bedside and pt agreed to plan.    INCISION AND DRAINAGE Performed  by: Victoria Helper, PA Consent: Verbal consent obtained. Risks and benefits: risks, benefits and alternatives were discussed Type: abscess Body area: Left axillary region Anesthesia: local infiltration Incision was made with a scalpel. Local anesthetic: lidocaine with epinephrine Anesthetic total: 12 ml Complexity: complex Blunt dissection to break up loculations Drainage: purulent Drainage amount: Moderate Packing material: 1/4 in iodoform gauze Patient tolerance: Patient tolerated the procedure well with no immediate complications.   Labs Review Labs Reviewed - No data to display  Imaging Review No results found.  MDM   1. Cutaneous abscess of left axilla    BP 134/83  Pulse 100  Resp 18  SpO2 100%   I personally performed the services described in this documentation, which was scribed in my presence. The recorded information has been reviewed and is accurate.    Victoria Helper, PA-C 03/04/13 1037

## 2013-03-04 NOTE — ED Provider Notes (Signed)
Medical screening examination/treatment/procedure(s) were performed by non-physician practitioner and as supervising physician I was immediately available for consultation/collaboration.  Flint Melter, MD 03/04/13 [phone]

## 2013-03-04 NOTE — ED Notes (Signed)
Pt drove herself. 

## 2013-03-06 ENCOUNTER — Encounter: Payer: Self-pay | Admitting: Family Medicine

## 2013-03-06 ENCOUNTER — Ambulatory Visit (INDEPENDENT_AMBULATORY_CARE_PROVIDER_SITE_OTHER): Payer: Medicaid Other | Admitting: Family Medicine

## 2013-03-06 VITALS — BP 131/83 | HR 81 | Temp 98.4°F | Wt 193.0 lb

## 2013-03-06 DIAGNOSIS — L732 Hidradenitis suppurativa: Secondary | ICD-10-CM

## 2013-03-06 NOTE — Assessment & Plan Note (Signed)
6 inches of packing removed and repacked with ~ 4inches - Overlying areas of skin erythema and sloughing with surrounding induration - Topical Bactroban given and rewrapped - Continued Clinda course initiated in ED - Advised to keep apt with PCP in two days for reevaluation

## 2013-03-06 NOTE — Progress Notes (Signed)
Subjective:     Patient ID: Victoria Rodriguez, female   DOB: September 22, 1980, 32 y.o.   MRN: [id number]  HPI Comments: Ms Buttacavoli presents today for removal of package from I&D performed two days ago to her left axilla. She reports some tenderness surrounding the area and drainage, but denies fevers. She has had previous surgeries for b/l axillary hidradenitis suppurativa (last 2011), and had remained symptom free until this episode. She was prescribed clindamycin in the ED and has been taking that as prescribed.      Review of Systems  Constitutional: Negative for fever, chills, diaphoresis and fatigue.       Objective:   Physical Exam  Constitutional: She appears well-developed and well-nourished.  Cardiovascular: Normal rate, regular rhythm and normal heart sounds.   Pulmonary/Chest: Effort normal and breath sounds normal. No respiratory distress.  Skin:  3 cm x 2 cm area of moist erythema and swelling under her left axilla with overlying skin sloughing. Surrounding area on induration w/o fluctuance        Assessment:     See Problem focused A&P

## 2013-03-06 NOTE — Patient Instructions (Signed)
It was great seeing you today. Below is a list of the things we talked about:   1) Return to clinic in two days for evaluation to ensure continued healing  2) Continue your antibiotics as previously prescribed.   Please check-out at the front desk before leaving the clinic.  I look forward to talking with you again at our next visit. If you have any questions or concerns before then, please call the clinic at [phone].  See you soon,   Dr Wenda Low

## 2013-03-08 ENCOUNTER — Encounter: Payer: Self-pay | Admitting: Family Medicine

## 2013-03-08 ENCOUNTER — Ambulatory Visit (INDEPENDENT_AMBULATORY_CARE_PROVIDER_SITE_OTHER): Payer: Medicaid Other | Admitting: Family Medicine

## 2013-03-08 VITALS — BP 120/82 | HR 68 | Temp 97.9°F | Wt 193.0 lb

## 2013-03-08 DIAGNOSIS — Z23 Encounter for immunization: Secondary | ICD-10-CM

## 2013-03-08 DIAGNOSIS — L732 Hidradenitis suppurativa: Secondary | ICD-10-CM

## 2013-03-08 DIAGNOSIS — R358 Other polyuria: Secondary | ICD-10-CM

## 2013-03-08 DIAGNOSIS — R3589 Other polyuria: Secondary | ICD-10-CM | POA: Insufficient documentation

## 2013-03-08 NOTE — Patient Instructions (Signed)
Victoria Rodriguez,  Thank you for coming in today. Your exam is normal other than the hidradenitis.  Please use steri stips to cover open area in your groin Finish antibiotics and for your L upper arm. Call if area is worsening: swelling again, becoming more painful for referral back to [facility] [place] [facility].   Dr. Armen Pickup

## 2013-03-08 NOTE — Progress Notes (Signed)
  Subjective:    Patient ID: Victoria Rodriguez, female    DOB: [birth year], 32 y.o.   MRN: [id number]  HPI 32 year old female presents for yearly wellness physical and also to discuss the following concerns:  #1 left axillary hidradenitis: Patient with left axillary abscesses started 9 days ago. She presented to the ED 5 days after onset of swelling and pain and had an I&D done with packing. She was started on clindamycin. She presented to the clinic 2 days ago and had the wound repacked. Today she reports that the packing fell out on its own. She still has minimal drainage from the wound. She denies fever, chills, malaise. Her pain is 5/10. He still taking clindamycin. Of note she is status post bilateral axillary hidradenitis excision in 2011. Her surgeon was Dr. Lodema Pilot at [facility] surgery.  Social history review: Patient is a nonsmoker.  Review of Systems Patient Information Form: Screening and ROS  AUDIT-C Score: 1 Do you feel safe in relationships? yes PHQ-2:negative  Review of Symptoms  General:  Negative for nexplained weight loss, fever Skin: Positive for new or changing mole, sore that won't heal HEENT: Negative for trouble hearing, trouble seeing, ringing in ears, mouth sores, hoarseness, change in voice, dysphagia. CV:  Negative for chest pain, dyspnea, edema, palpitations Resp: Negative for cough, dyspnea, hemoptysis GI: Negative for nausea, vomiting, diarrhea, constipation, abdominal pain, melena, hematochezia. GU: Negative for dysuria, incontinence, urinary hesitance, hematuria, vaginal or penile discharge, polyuria, sexual difficulty, lumps in testicle or breasts MSK: Negative for muscle cramps or aches, joint pain or swelling Neuro: Negative for headaches, weakness, numbness, dizziness, passing out/fainting Psych: Negative for depression, anxiety, memory problems Endo: Positive for frequent urination. Negative for excessive thirst.     Objective: BP 120/82   Pulse 68  Temp(Src) 97.9 F (36.6 C) (Oral)  Wt 193 lb (87.544 kg)  BMI 34.2 kg/m2   Physical Exam  Constitutional: She is oriented to person, place, and time. She appears well-developed and well-nourished. No distress.  HENT:  Head: Atraumatic.  Eyes: Conjunctivae and EOM are normal. Pupils are equal, round, and reactive to light.  Neck: Normal range of motion. Neck supple. No thyromegaly present.  Cardiovascular: Normal rate, regular rhythm, normal heart sounds and intact distal pulses.   Pulmonary/Chest: Effort normal and breath sounds normal. She has no wheezes.  Abdominal: Soft. Bowel sounds are normal. She exhibits no distension.  Genitourinary: No breast swelling, tenderness, discharge or bleeding.  Musculoskeletal: Normal range of motion. She exhibits no edema and no tenderness.  Neurological: She is alert and oriented to person, place, and time. No cranial nerve deficit.  Skin: Skin is warm and dry.  Scars in b/l axilla.   L axilla: 5 cm area of induration, mild eythema and TTP. Minimal drainage. No fluctuance.   Inguinal: 1 cm linear open area L labia majora. No drainage. Minimally TTP. No erythema or fluctuance.   Breast: Black heads under L breast.    Psychiatric: She has a normal mood and affect.          Assessment & Plan:

## 2013-03-08 NOTE — Assessment & Plan Note (Signed)
Normal screening A1c.

## 2013-03-08 NOTE — Assessment & Plan Note (Signed)
A: L axilla. Active, healing slowly. Left groin, chronic, small nonhealing wound. P: Did not repack today Patient advised to finish clindamycin. Patient advised to call if the swelling or pain worsens for referral back to [facility] surgery.  Patient given Steri-Strips applied to her wound in her left groin.

## 2013-04-01 ENCOUNTER — Emergency Department (HOSPITAL_COMMUNITY)
Admission: EM | Admit: 2013-04-01 | Discharge: 2013-04-01 | Disposition: A | Discharge status: Home / Self Care | Payer: Medicaid Other | Attending: Emergency Medicine | Admitting: Emergency Medicine

## 2013-04-01 ENCOUNTER — Encounter (HOSPITAL_COMMUNITY): Payer: Self-pay | Admitting: Emergency Medicine

## 2013-04-01 DIAGNOSIS — Z872 Personal history of diseases of the skin and subcutaneous tissue: Secondary | ICD-10-CM | POA: Insufficient documentation

## 2013-04-01 DIAGNOSIS — J029 Acute pharyngitis, unspecified: Secondary | ICD-10-CM | POA: Insufficient documentation

## 2013-04-01 DIAGNOSIS — R197 Diarrhea, unspecified: Secondary | ICD-10-CM | POA: Insufficient documentation

## 2013-04-01 LAB — BASIC METABOLIC PANEL
Calcium: 9 mg/dL (ref 8.4–10.5)
Creatinine, Ser: 0.53 mg/dL (ref 0.50–1.10)
GFR calc Af Amer: >90 mL/min (ref >90)
GFR calc non Af Amer: >90 mL/min (ref >90)
Sodium: 136 mEq/L (ref 135–145)

## 2013-04-01 LAB — CBC WITH DIFFERENTIAL/PLATELET
Basophils Absolute: 0 10*3/uL (ref 0.0–0.1)
Basophils Relative: 0 % (ref 0–1)
Eosinophils Absolute: 0 10*3/uL (ref 0.0–0.7)
Eosinophils Relative: 0 % (ref 0–5)
HCT: 38.9 % (ref 36.0–46.0)
Lymphocytes Relative: 19 % (ref 12–46)
MCHC: 33.9 g/dL (ref 30.0–36.0)
MCV: 85.3 fL (ref 78.0–100.0)
Monocytes Absolute: 1.4 10*3/uL — ABNORMAL HIGH (ref 0.1–1.0)
Platelets: 251 10*3/uL (ref 150–400)
RDW: 13.8 % (ref 11.5–15.5)
WBC: 11.2 10*3/uL — ABNORMAL HIGH (ref 4.0–10.5)

## 2013-04-01 MED ORDER — SODIUM CHLORIDE 0.9 % IV SOLN
1000.0000 mL | Freq: Once | INTRAVENOUS | Status: AC
  Administered 2013-04-01: 1000 mL via INTRAVENOUS

## 2013-04-01 MED ORDER — TRAMADOL HCL 50 MG PO TABS: 50.0000 mg | ORAL_TABLET | Freq: Four times a day (QID) | ORAL | Status: DC | PRN

## 2013-04-01 MED ORDER — LOPERAMIDE HCL 2 MG PO CAPS
4.0000 mg | ORAL_CAPSULE | Freq: Once | ORAL | Status: AC
  Administered 2013-04-01: 4 mg via ORAL
  Filled 2013-04-01: qty 2

## 2013-04-01 MED ORDER — SODIUM CHLORIDE 0.9 % IV SOLN
1000.0000 mL | INTRAVENOUS | Status: DC
  Administered 2013-04-01: 1000 mL via INTRAVENOUS

## 2013-04-01 MED ORDER — DEXAMETHASONE SODIUM PHOSPHATE 10 MG/ML IJ SOLN
10.0000 mg | Freq: Once | INTRAMUSCULAR | Status: AC
  Administered 2013-04-01: 10 mg via INTRAVENOUS
  Filled 2013-04-01: qty 1

## 2013-04-01 NOTE — ED Notes (Signed)
Pt states she awoke Thursday morning with a sore throat is is not barely able to swallow.  Diarrhea also noted since thursday

## 2013-04-01 NOTE — ED Notes (Signed)
Patient discharged to home with family. NAD.  

## 2013-04-01 NOTE — ED Provider Notes (Signed)
CSN: [id number]     Arrival date & time 04/01/13  0330 History   First MD Initiated Contact with Patient 04/01/13 0340     Chief Complaint  Patient presents with  . Sore Throat  . Diarrhea   (Consider location/radiation/quality/duration/timing/severity/associated sxs/prior Treatment) Patient is a 32 y.o. female presenting with pharyngitis and diarrhea. The history is provided by the patient.  Sore Throat  Diarrhea She has had a sore throat for the last 3 days. Pain is worse with swallowing. Pain is severe she rates it 10/10. Symptoms have been getting worse. She has been having difficulty swallowing because of the pain. She denies fever, chills, sweats. She denies rhinorrhea or cough. She denies nausea or vomiting. She denies arthralgias or myalgias. She has been having diarrhea with 3-4 watery bowel movements a day. She denies any sick contacts. She has tried taking over-the-counter medications such as TheraFlu with no relief.  Past Medical History  Diagnosis Date  . Hidradenitis suppurativa     surgical   Past Surgical History  Procedure Laterality Date  . Hydradenitis excision    . Axillary hidradenitis excision  2011    bilateral   Family History  Problem Relation Age of Onset  . Asthma Mother   . Diabetes Father   . Asthma Sister   . Asthma Brother   . Cancer Neg Hx   . Heart disease Neg Hx   . Stroke Neg Hx    History  Substance Use Topics  . Smoking status: Never Smoker   . Smokeless tobacco: Never Used  . Alcohol Use: Yes     Comment: drinks monthly or less.   OB History   Grav Para Term Preterm Abortions TAB SAB Ect Mult Living   3 1 1  0 2 2    1      Review of Systems  Gastrointestinal: Positive for diarrhea.  All other systems reviewed and are negative.    Allergies  Vicodin  Home Medications   Current Outpatient Rx  Name  Route  Sig  Dispense  Refill  . Pseudoeph-Doxylamine-DM-APAP (NYQUIL PO)   Oral   Take 30 mLs by mouth every 6 (six) hours  as needed (cough and congestion).         . clindamycin (CLEOCIN) 150 MG capsule   Oral   Take 1 capsule (150 mg total) by mouth every 6 (six) hours.   28 capsule   0   . etonogestrel (IMPLANON) 68 MG IMPL implant   Subcutaneous   Inject 1 each into the skin once. [facility]          BP 136/92  Pulse 98  Temp(Src) 99.1 F (37.3 C) (Oral)  Resp 16  SpO2 100% Physical Exam  Nursing note and vitals reviewed.  32 year old female, resting comfortably and in no acute distress. Vital signs are significant for mild hypertension with blood pressure 136/92. Oxygen saturation is 100%, which is normal. Head is normocephalic and atraumatic. PERRLA, EOMI. Oropharynx has mild erythema without tonsillar hypertrophy or exudate. She's not having any difficulty with secretions and phonation is normal. Neck is nontender and supple without adenopathy or JVD. There is no stridor. Back is nontender and there is no CVA tenderness. Lungs are clear without rales, wheezes, or rhonchi. Chest is nontender. Heart has regular rate and rhythm without murmur. Abdomen is soft, flat, nontender without masses or hepatosplenomegaly and peristalsis is normoactive. Extremities have no cyanosis or edema, full range of  motion is present. Skin is warm and dry without rash. Neurologic: Mental status is normal, cranial nerves are intact, there are no motor or sensory deficits.  ED Course  Procedures (including critical care time) Labs Review Results for orders placed during the hospital encounter of 04/01/13  RAPID STREP SCREEN      Result Value Range   Streptococcus, Group A Screen (Direct) NEGATIVE  NEGATIVE  CBC WITH DIFFERENTIAL      Result Value Range   WBC 11.2 (*) 4.0 - 10.5 K/uL   RBC 4.56  3.87 - 5.11 MIL/uL   Hemoglobin 13.2  12.0 - 15.0 g/dL   HCT 96.0  45.4 - 09.8 %   MCV 85.3  78.0 - 100.0 fL   MCH 28.9  26.0 - 34.0 pg   MCHC 33.9  30.0 - 36.0 g/dL   RDW 11.9   14.7 - 82.9 %   Platelets 251  150 - 400 K/uL   Neutrophils Relative % 68  43 - 77 %   Neutro Abs 7.6  1.7 - 7.7 K/uL   Lymphocytes Relative 19  12 - 46 %   Lymphs Abs 2.2  0.7 - 4.0 K/uL   Monocytes Relative 12  3 - 12 %   Monocytes Absolute 1.4 (*) 0.1 - 1.0 K/uL   Eosinophils Relative 0  0 - 5 %   Eosinophils Absolute 0.0  0.0 - 0.7 K/uL   Basophils Relative 0  0 - 1 %   Basophils Absolute 0.0  0.0 - 0.1 K/uL  BASIC METABOLIC PANEL      Result Value Range   Sodium 136  135 - 145 mEq/L   Potassium 3.2 (*) 3.5 - 5.1 mEq/L   Chloride 102  96 - 112 mEq/L   CO2 23  19 - 32 mEq/L   Glucose, Bld 100 (*) 70 - 99 mg/dL   BUN 7  6 - 23 mg/dL   Creatinine, Ser 5.62  0.50 - 1.10 mg/dL   Calcium 9.0  8.4 - 13.0 mg/dL   GFR calc non Af Amer >90  >90 mL/min   GFR calc Af Amer >90  >90 mL/min    MDM   1. Pharyngitis   2. Diarrhea    Pharyngitis-rule out streptococcal disease. Diarrhea. She'll be given IV hydration and IV dexamethasone as well as oral loperamide. Rapid strep screen will be obtained.  Screen is negative. Patient states she's not feeling significantly better however she appears well-hydrated and is resting comfortably. She is tolerating fluids. She is discharged with prescription for tramadol for pain and is otherwise to take loperamide as needed for diarrhea. Follow up with PCP as needed.  Dione Booze, MD 04/01/13 [phone]

## 2013-04-03 LAB — CULTURE, GROUP A STREP

## 2013-05-22 ENCOUNTER — Encounter (HOSPITAL_COMMUNITY): Payer: Self-pay | Admitting: Emergency Medicine

## 2013-05-22 ENCOUNTER — Emergency Department (HOSPITAL_COMMUNITY)
Admission: EM | Admit: 2013-05-22 | Discharge: 2013-05-22 | Disposition: A | Discharge status: Home / Self Care | Payer: Medicaid Other | Attending: Emergency Medicine | Admitting: Emergency Medicine

## 2013-05-22 DIAGNOSIS — R509 Fever, unspecified: Secondary | ICD-10-CM

## 2013-05-22 DIAGNOSIS — J020 Streptococcal pharyngitis: Secondary | ICD-10-CM

## 2013-05-22 DIAGNOSIS — Z872 Personal history of diseases of the skin and subcutaneous tissue: Secondary | ICD-10-CM | POA: Insufficient documentation

## 2013-05-22 LAB — RAPID STREP SCREEN (MED CTR MEBANE ONLY): Streptococcus, Group A Screen (Direct): NEGATIVE

## 2013-05-22 MED ORDER — PENICILLIN G BENZATHINE 1200000 UNIT/2ML IM SUSP
1.2000 10*6.[IU] | Freq: Once | INTRAMUSCULAR | Status: AC
  Administered 2013-05-22: 1.2 10*6.[IU] via INTRAMUSCULAR
  Filled 2013-05-22: qty 2

## 2013-05-22 MED ORDER — DEXAMETHASONE SODIUM PHOSPHATE 10 MG/ML IJ SOLN
10.0000 mg | Freq: Once | INTRAMUSCULAR | Status: AC
  Administered 2013-05-22: 10 mg via INTRAMUSCULAR
  Filled 2013-05-22: qty 1

## 2013-05-22 MED ORDER — IBUPROFEN 800 MG PO TABS
800.0000 mg | ORAL_TABLET | Freq: Once | ORAL | Status: AC
  Administered 2013-05-22: 800 mg via ORAL
  Filled 2013-05-22: qty 1

## 2013-05-22 NOTE — ED Notes (Signed)
Patient states sore throat and feeling like she can't swallow completely, patient states neck pain on left side, patient also with body aches and intermittent diarrhea

## 2013-05-22 NOTE — ED Provider Notes (Signed)
Medical screening examination/treatment/procedure(s) were performed by non-physician practitioner and as supervising physician I was immediately available for consultation/collaboration.  EKG Interpretation   None         Audree Camel, MD 05/22/13 1927

## 2013-05-22 NOTE — ED Provider Notes (Signed)
CSN: [id number]     Arrival date & time 05/22/13  0804 History   First MD Initiated Contact with Patient 05/22/13 0827     Chief Complaint  Patient presents with  . Sore Throat   (Consider location/radiation/quality/duration/timing/severity/associated sxs/prior Treatment) The history is provided by the patient and medical records.   This is a 32 year old female with no significant past medical history presenting to the ED for sore throat and painful swallowing x 4 days.  Denies difficulty swallowing or breathing.  Pt denies recent sick contacts with similar sx.  Denies fevers, sweats, or chills-- febrile to 101.105F on arrival.  Denies cough, cold, congestion, rhinorrhea, or shortness of breath.  Past Medical History  Diagnosis Date  . Hidradenitis suppurativa     surgical   Past Surgical History  Procedure Laterality Date  . Hydradenitis excision    . Axillary hidradenitis excision  2011    bilateral   Family History  Problem Relation Age of Onset  . Asthma Mother   . Diabetes Father   . Asthma Sister   . Asthma Brother   . Cancer Neg Hx   . Heart disease Neg Hx   . Stroke Neg Hx    History  Substance Use Topics  . Smoking status: Never Smoker   . Smokeless tobacco: Never Used  . Alcohol Use: Yes     Comment: drinks monthly or less.   OB History   Grav Para Term Preterm Abortions TAB SAB Ect Mult Living   3 1 1  0 2 2    1      Review of Systems  HENT: Positive for sore throat.   All other systems reviewed and are negative.    Allergies  Vicodin  Home Medications   Current Outpatient Rx  Name  Route  Sig  Dispense  Refill  . DM-Phenylephrine-Acetaminophen 10-5-325 MG/15ML LIQD   Oral   Take 15 mLs by mouth every 4 (four) hours as needed (sore throat, cold symptoms).         . Pseudoeph-Doxylamine-DM-APAP (NYQUIL PO)   Oral   Take 30 mLs by mouth every 6 (six) hours as needed (cough and congestion).         Marland Kitchen etonogestrel (IMPLANON) 68 MG IMPL  implant   Subcutaneous   Inject 1 each into the skin once. [facility]          BP 128/86  Pulse 106  Temp(Src) 101.9 F (38.8 C) (Oral)  Resp 16  SpO2 99%  Physical Exam  Nursing note and vitals reviewed. Constitutional: She is oriented to person, place, and time. She appears well-developed and well-nourished. No distress.  Pt talking on cell phone, NAD  HENT:  Head: Normocephalic and atraumatic.  Mouth/Throat: Uvula is midline and mucous membranes are normal. No trismus in the jaw. Posterior oropharyngeal erythema present. No oropharyngeal exudate, posterior oropharyngeal edema or tonsillar abscesses.  Oropharynx erythematous, Tonsils 2+ bilaterally with exudate on right tonsil; uvula midline, no evidence of peritonsillar abscess; handling secretions appropriately, no difficulty swallowing, airway patent, no neck or facial swelling  Eyes: Conjunctivae and EOM are normal. Pupils are equal, round, and reactive to light.  Neck: Normal range of motion. Neck supple.  Right anterior cervical lymphadenopathy  Cardiovascular: Normal rate, regular rhythm and normal heart sounds.   Pulmonary/Chest: Effort normal and breath sounds normal. No respiratory distress. She has no rales.  Musculoskeletal: Normal range of motion.  Lymphadenopathy:    She has cervical  adenopathy.  Neurological: She is alert and oriented to person, place, and time.  Skin: Skin is warm and dry. She is not diaphoretic.  Psychiatric: She has a normal mood and affect.    ED Course  Procedures (including critical care time) Labs Review Labs Reviewed  RAPID STREP SCREEN   Imaging Review No results found.  EKG Interpretation   None       MDM   1. Strep pharyngitis   2. Fever    Motrin given for fever.  Pt meets 4/5 [facility] criteria, will tx ppx for streph pharyngitis with bicillin and decadron. Continue tylenol/motrin at home PRN fever. FU with PCP if problems occur.   Discussed plan with pt, she agreed.  Return precautions advised.  Garlon Hatchet, PA-C 05/22/13 1121

## 2013-05-24 LAB — CULTURE, GROUP A STREP

## 2013-05-28 ENCOUNTER — Ambulatory Visit (INDEPENDENT_AMBULATORY_CARE_PROVIDER_SITE_OTHER): Payer: Medicaid Other | Admitting: Family Medicine

## 2013-05-28 ENCOUNTER — Telehealth: Payer: Self-pay | Admitting: Family Medicine

## 2013-05-28 ENCOUNTER — Encounter: Payer: Self-pay | Admitting: Family Medicine

## 2013-05-28 VITALS — BP 116/79 | HR 96 | Ht 63.0 in | Wt 195.3 lb

## 2013-05-28 DIAGNOSIS — N898 Other specified noninflammatory disorders of vagina: Secondary | ICD-10-CM

## 2013-05-28 DIAGNOSIS — R300 Dysuria: Secondary | ICD-10-CM

## 2013-05-28 DIAGNOSIS — N760 Acute vaginitis: Secondary | ICD-10-CM

## 2013-05-28 LAB — POCT URINALYSIS DIPSTICK
Bilirubin, UA: NEGATIVE
Ketones, UA: NEGATIVE
Spec Grav, UA: 1.01
Urobilinogen, UA: 0.2
pH, UA: 5.5

## 2013-05-28 LAB — POCT UA - MICROSCOPIC ONLY

## 2013-05-28 LAB — POCT WET PREP (WET MOUNT)

## 2013-05-28 MED ORDER — METRONIDAZOLE 500 MG PO TABS: 500.0000 mg | ORAL_TABLET | Freq: Two times a day (BID) | ORAL | Status: DC

## 2013-05-28 MED ORDER — FLUCONAZOLE 150 MG PO TABS: 150.0000 mg | ORAL_TABLET | Freq: Once | ORAL | Status: DC

## 2013-05-28 NOTE — Addendum Note (Signed)
Addended by: [place], Yamin Sw[place]ler on: 05/28/2013 10:11 AM   Modules accepted: Orders

## 2013-05-28 NOTE — Patient Instructions (Signed)
Victoria Rodriguez,  Thank you for coming in today. Your exam is consistent with vaginal yeast infection from the antibiotic and steroid most likely. I have sent in diflucan.  Please request diflucan in the future if you are ever prescribed a steroid burst or antibiotics. If it is not given you can call me and I will send it in.  Have a happy new year.  Dr. Armen Pickup

## 2013-05-28 NOTE — Progress Notes (Signed)
   Subjective:    Patient ID: Victoria Rodriguez, female    DOB: [birth year], 32 y.o.   MRN: [id number]  HPI 32 yo F presents with vaginal irritation x 3 days. Mild dysuria this AM. Denies lesions and vaginal discharge. Denies history of genital herpes and new sex partner. She was treated last week for pharyngitis with a shot of PCN and a 5 day course of oral steroids.   Review of Systems As per HPI     Objective:   Physical Exam BP 116/79  Pulse 96  Ht 5\' 3"  (1.6 m)  Wt 195 lb 4.8 oz (88.587 kg)  BMI 34.60 kg/m2 General appearance: alert, cooperative and no distress Pelvic: cervix normal in appearance, external genitalia normal, positive findings: vaginal discharge:  thick, curd-like and odorless, rectovaginal septum normal and uterus normal size, shape, and consistency  Wet prep collected UA collected     Assessment & Plan:

## 2013-05-28 NOTE — Telephone Encounter (Signed)
Called patient. Reviewed wet prep BV and yeast Plan  Diflucan x 1 now Flagyl x 7 days Diflucan x 1 after 7 days  Patient agrees with plan and voices understanding.

## 2013-05-28 NOTE — Assessment & Plan Note (Signed)
A: exam consistent with vaginal yeast infection post abx and steroids P: treat with diflucan.

## 2013-09-21 ENCOUNTER — Emergency Department (HOSPITAL_COMMUNITY)
Admission: EM | Admit: 2013-09-21 | Discharge: 2013-09-21 | Disposition: A | Discharge status: Home / Self Care | Payer: Medicaid Other | Attending: Emergency Medicine | Admitting: Emergency Medicine

## 2013-09-21 DIAGNOSIS — R112 Nausea with vomiting, unspecified: Secondary | ICD-10-CM

## 2013-09-21 DIAGNOSIS — Z872 Personal history of diseases of the skin and subcutaneous tissue: Secondary | ICD-10-CM | POA: Insufficient documentation

## 2013-09-21 DIAGNOSIS — R197 Diarrhea, unspecified: Secondary | ICD-10-CM

## 2013-09-21 DIAGNOSIS — B349 Viral infection, unspecified: Secondary | ICD-10-CM

## 2013-09-21 DIAGNOSIS — B9789 Other viral agents as the cause of diseases classified elsewhere: Secondary | ICD-10-CM | POA: Insufficient documentation

## 2013-09-21 DIAGNOSIS — J029 Acute pharyngitis, unspecified: Secondary | ICD-10-CM

## 2013-09-21 LAB — BASIC METABOLIC PANEL
BUN: 6 mg/dL (ref 6–23)
CO2: 22 meq/L (ref 19–32)
Calcium: 9.5 mg/dL (ref 8.4–10.5)
Chloride: 100 mEq/L (ref 96–112)
Creatinine, Ser: 0.56 mg/dL (ref 0.50–1.10)
GFR calc Af Amer: >90 mL/min (ref >90)
Glucose, Bld: 84 mg/dL (ref 70–99)
Potassium: 4 mEq/L (ref 3.7–5.3)
SODIUM: 137 meq/L (ref 137–147)

## 2013-09-21 LAB — CBC WITH DIFFERENTIAL/PLATELET
BASOS ABS: 0 10*3/uL (ref 0.0–0.1)
Basophils Relative: 0 % (ref 0–1)
EOS PCT: 0 % (ref 0–5)
Eosinophils Absolute: 0 10*3/uL (ref 0.0–0.7)
HCT: 41.5 % (ref 36.0–46.0)
Hemoglobin: 13.9 g/dL (ref 12.0–15.0)
Lymphocytes Relative: 33 % (ref 12–46)
Lymphs Abs: 2.5 10*3/uL (ref 0.7–4.0)
MCH: 28.8 pg (ref 26.0–34.0)
MCHC: 33.5 g/dL (ref 30.0–36.0)
MCV: 85.9 fL (ref 78.0–100.0)
Monocytes Absolute: 0.5 10*3/uL (ref 0.1–1.0)
Monocytes Relative: 7 % (ref 3–12)
Neutro Abs: 4.6 10*3/uL (ref 1.7–7.7)
Neutrophils Relative %: 60 % (ref 43–77)
PLATELETS: 331 10*3/uL (ref 150–400)
RBC: 4.83 MIL/uL (ref 3.87–5.11)
RDW: 13.4 % (ref 11.5–15.5)
WBC: 7.8 10*3/uL (ref 4.0–10.5)

## 2013-09-21 MED ORDER — IBUPROFEN 800 MG PO TABS: 800.0000 mg | ORAL_TABLET | Freq: Three times a day (TID) | ORAL | Status: DC | PRN

## 2013-09-21 MED ORDER — BENZONATATE 200 MG PO CAPS: 200.0000 mg | ORAL_CAPSULE | Freq: Three times a day (TID) | ORAL | Status: DC | PRN

## 2013-09-21 MED ORDER — MENTHOL 3 MG MT LOZG
1.0000 | LOZENGE | OROMUCOSAL | Status: DC | PRN
  Filled 2013-09-21: qty 9

## 2013-09-21 MED ORDER — BENZONATATE 100 MG PO CAPS
200.0000 mg | ORAL_CAPSULE | Freq: Three times a day (TID) | ORAL | Status: DC | PRN
  Administered 2013-09-21: 200 mg via ORAL
  Filled 2013-09-21: qty 2

## 2013-09-21 MED ORDER — MENTHOL 3 MG MT LOZG: 1.0000 | LOZENGE | OROMUCOSAL | Status: DC | PRN

## 2013-09-21 MED ORDER — IBUPROFEN 800 MG PO TABS
800.0000 mg | ORAL_TABLET | Freq: Once | ORAL | Status: AC
  Administered 2013-09-21: 800 mg via ORAL
  Filled 2013-09-21: qty 1

## 2013-09-21 MED ORDER — ONDANSETRON 8 MG PO TBDP: 8.0000 mg | ORAL_TABLET | Freq: Three times a day (TID) | ORAL | Status: DC | PRN

## 2013-09-21 NOTE — Discharge Instructions (Signed)
Diarrhea Diarrhea is watery poop (stool). It can make you feel weak, tired, thirsty, or give you a dry mouth (signs of dehydration). Watery poop is a sign of another problem, most often an infection. It often lasts 2 3 days. It can last longer if it is a sign of something serious. Take care of yourself as told by your doctor. HOME CARE   Drink 1 cup (8 ounces) of fluid each time you have watery poop.  Do not drink the following fluids:  Those that contain simple sugars (fructose, glucose, galactose, lactose, sucrose, maltose).  Sports drinks.  Fruit juices.  Whole milk products.  Sodas.  Drinks with caffeine (coffee, tea, soda) or alcohol.  Oral rehydration solution may be used if the doctor says it is okay. You may make your own solution. Follow this recipe:    teaspoon table salt.   teaspoon baking soda.   teaspoon salt substitute containing potassium chloride.  1 tablespoons sugar.  1 liter (34 ounces) of water.  Avoid the following foods:  High fiber foods, such as raw fruits and vegetables.  Nuts, seeds, and whole grain breads and cereals.   Those that are sweetened with sugar alcohols (xylitol, sorbitol, mannitol).  Try eating the following foods:  Starchy foods, such as rice, toast, pasta, low-sugar cereal, oatmeal, baked potatoes, crackers, and bagels.  Bananas.  Applesauce.  Eat probiotic-rich foods, such as yogurt and milk products that are fermented.  Wash your hands well after each time you have watery poop.  Only take medicine as told by your doctor.  Take a warm bath to help lessen burning or pain from having watery poop. GET HELP RIGHT AWAY IF:   You cannot drink fluids without throwing up (vomiting).  You keep throwing up.  You have blood in your poop, or your poop looks black and tarry.  You do not pee (urinate) in 6 8 hours, or there is only a small amount of very dark pee.  You have belly (abdominal) pain that gets worse or stays in  the same spot (localizes).  You are weak, dizzy, confused, or lightheaded.  You have a very bad headache.  Your watery poop gets worse or does not get better.  You have a fever or lasting symptoms for more than 2 3 days.  You have a fever and your symptoms suddenly get worse. MAKE SURE YOU:   Understand these instructions.  Will watch your condition.  Will get help right away if you are not doing well or get worse. Document Released: 11/03/2007 Document Revised: 02/09/2012 Document Reviewed: 01/23/2012 [facility] Patient Information 2014 [place], [place].  Diet for Diarrhea, Adult Frequent, runny stools (diarrhea) may be caused or worsened by food or drink. Diarrhea may be relieved by changing your diet. Since diarrhea can last up to 7 days, it is easy for you to lose too much fluid from the body and become dehydrated. Fluids that are lost need to be replaced. Along with a modified diet, make sure you drink enough fluids to keep your urine clear or pale yellow. DIET INSTRUCTIONS  Ensure adequate fluid intake (hydration): have 1 cup (8 oz) of fluid for each diarrhea episode. Avoid fluids that contain simple sugars or sports drinks, fruit juices, whole milk products, and sodas. Your urine should be clear or pale yellow if you are drinking enough fluids. Hydrate with an oral rehydration solution that you can purchase at pharmacies, retail stores, and online. You can prepare an oral rehydration solution at home  by mixing the following ingredients together:    tsp table salt.   tsp baking soda.   tsp salt substitute containing potassium chloride.  1  tablespoons sugar.  1 L (34 oz) of water.  Certain foods and beverages may increase the speed at which food moves through the gastrointestinal (GI) tract. These foods and beverages should be avoided and include:  Caffeinated and alcoholic beverages.  High-fiber foods, such as raw fruits and vegetables, nuts, seeds, and whole grain  breads and cereals.  Foods and beverages sweetened with sugar alcohols, such as xylitol, sorbitol, and mannitol.  Some foods may be well tolerated and may help thicken stool including:  Starchy foods, such as rice, toast, pasta, low-sugar cereal, oatmeal, grits, baked potatoes, crackers, and bagels.   Bananas.   Applesauce.  Add probiotic-rich foods to help increase healthy bacteria in the GI tract, such as yogurt and fermented milk products. RECOMMENDED FOODS AND BEVERAGES Starches Choose foods with less than 2 g of fiber per serving.  Recommended:  White, [place], and pita breads, plain rolls, buns, bagels. Plain muffins, matzo. Soda, saltine, or graham crackers. Pretzels, melba toast, zwieback. Cooked cereals made with water: cornmeal, farina, cream cereals. Dry cereals: refined corn, wheat, rice. Potatoes prepared any way without skins, refined macaroni, spaghetti, noodles, refined rice.  Avoid:  Bread, rolls, or crackers made with whole wheat, multi-grains, rye, bran seeds, nuts, or coconut. Corn tortillas or taco shells. Cereals containing whole grains, multi-grains, bran, coconut, nuts, raisins. Cooked or dry oatmeal. Coarse wheat cereals, granola. Cereals advertised as "high-fiber." Potato skins. Whole grain pasta, wild or brown rice. Popcorn. Sweet potatoes, yams. Sweet rolls, doughnuts, waffles, pancakes, sweet breads. Vegetables  Recommended: Strained tomato and vegetable juices. Most well-cooked and canned vegetables without seeds. Fresh: Tender lettuce, cucumber without the skin, cabbage, spinach, bean sprouts.  Avoid: Fresh, cooked, or canned: Artichokes, baked beans, beet greens, broccoli, Brussels sprouts, corn, kale, legumes, peas, sweet potatoes. Cooked: Green or red cabbage, spinach. Avoid large servings of any vegetables because vegetables shrink when cooked, and they contain more fiber per serving than fresh vegetables. Fruit  Recommended: Cooked or canned: Apricots,  applesauce, cantaloupe, cherries, fruit cocktail, grapefruit, grapes, kiwi, mandarin oranges, peaches, pears, plums, watermelon. Fresh: Apples without skin, ripe banana, grapes, cantaloupe, cherries, grapefruit, peaches, oranges, plums. Keep servings limited to  cup or 1 piece.  Avoid: Fresh: Apples with skin, apricots, mangoes, pears, raspberries, strawberries. Prune juice, stewed or dried prunes. Dried fruits, raisins, dates. Large servings of all fresh fruits. Protein  Recommended: Ground or well-cooked tender beef, ham, veal, lamb, pork, or poultry. Eggs. Fish, oysters, shrimp, lobster, other seafoods. Liver, organ meats.  Avoid: Tough, fibrous meats with gristle. Peanut butter, smooth or chunky. Cheese, nuts, seeds, legumes, dried peas, beans, lentils. Dairy  Recommended: Yogurt, lactose-free milk, kefir, drinkable yogurt, buttermilk, soy milk, or plain hard cheese.  Avoid: Milk, chocolate milk, beverages made with milk, such as milkshakes. Soups  Recommended: Bouillon, broth, or soups made from allowed foods. Any strained soup.  Avoid: Soups made from vegetables that are not allowed, cream or milk-based soups. Desserts and Sweets  Recommended: Sugar-free gelatin, sugar-free frozen ice pops made without sugar alcohol.  Avoid: Plain cakes and cookies, pie made with fruit, pudding, custard, cream pie. Gelatin, fruit, ice, sherbet, frozen ice pops. Ice cream, ice milk without nuts. Plain hard candy, honey, jelly, molasses, syrup, sugar, chocolate syrup, gumdrops, marshmallows. Fats and Oils  Recommended: Limit fats to less than 8 tsp per day.  Avoid: Seeds, nuts, olives, avocados. Margarine, butter, cream, mayonnaise, salad oils, plain salad dressings. Plain gravy, crisp bacon without rind. Beverages  Recommended: Water, decaffeinated teas, oral rehydration solutions, sugar-free beverages not sweetened with sugar alcohols.  Avoid: Fruit juices, caffeinated beverages (coffee, tea,  soda), alcohol, sports drinks, or lemon-lime soda. Condiments  Recommended: Ketchup, mustard, horseradish, vinegar, cocoa powder. Spices in moderation: allspice, basil, bay leaves, celery powder or leaves, cinnamon, cumin powder, curry powder, ginger, mace, marjoram, onion or garlic powder, oregano, paprika, parsley flakes, ground pepper, rosemary, sage, savory, tarragon, thyme, turmeric.  Avoid: Coconut, honey. Document Released: 08/07/2003 Document Revised: 02/09/2012 Document Reviewed: 10/01/2011 [facility] Patient Information 2014 [place].  Nausea and Vomiting Nausea means you feel sick to your stomach. Throwing up (vomiting) is a reflex where stomach contents come out of your mouth. HOME CARE   Take medicine as told by your doctor.  Do not force yourself to eat. However, you do need to drink fluids.  If you feel like eating, eat a normal diet as told by your doctor.  Eat rice, wheat, potatoes, bread, lean meats, yogurt, fruits, and vegetables.  Avoid high-fat foods.  Drink enough fluids to keep your pee (urine) clear or pale yellow.  Ask your doctor how to replace body fluid losses (rehydrate). Signs of body fluid loss (dehydration) include:  Feeling very thirsty.  Dry lips and mouth.  Feeling dizzy.  Dark pee.  Peeing less than normal.  Feeling confused.  Fast breathing or heart rate. GET HELP RIGHT AWAY IF:   You have blood in your throw up.  You have black or bloody poop (stool).  You have a bad headache or stiff neck.  You feel confused.  You have bad belly (abdominal) pain.  You have chest pain or trouble breathing.  You do not pee at least once every 8 hours.  You have cold, clammy skin.  You keep throwing up after 24 to 48 hours.  You have a fever. MAKE SURE YOU:   Understand these instructions.  Will watch your condition.  Will get help right away if you are not doing well or get worse. Document Released: 11/03/2007 Document  Revised: 08/09/2011 Document Reviewed: 10/16/2010 [facility] Patient Information 2014 [place], [place].  Pharyngitis Pharyngitis is a sore throat (pharynx). There is redness, pain, and swelling of your throat. HOME CARE   Drink enough fluids to keep your pee (urine) clear or pale yellow.  Only take medicine as told by your doctor.  You may get sick again if you do not take medicine as told. Finish your medicines, even if you start to feel better.  Do not take aspirin.  Rest.  Rinse your mouth (gargle) with salt water ( tsp of salt per 1 qt of water) every 1 2 hours. This will help the pain.  If you are not at risk for choking, you can suck on hard candy or sore throat lozenges. GET HELP IF:  You have large, tender lumps on your neck.  You have a rash.  You cough up green, yellow-brown, or bloody spit. GET HELP RIGHT AWAY IF:   You have a stiff neck.  You drool or cannot swallow liquids.  You throw up (vomit) or are not able to keep medicine or liquids down.  You have very bad pain that does not go away with medicine.  You have problems breathing (not from a stuffy nose). MAKE SURE YOU:   Understand these instructions.  Will watch your condition.  Will get  help right away if you are not doing well or get worse. Document Released: 11/03/2007 Document Revised: 03/07/2013 Document Reviewed: 01/22/2013 [facility] Patient Information 2014 [place].  Viral Infections A viral infection can be caused by different types of viruses.Most viral infections are not serious and resolve on their own. However, some infections may cause severe symptoms and may lead to further complications. SYMPTOMS Viruses can frequently cause:  Minor sore throat.  Aches and pains.  Headaches.  Runny nose.  Different types of rashes.  Watery eyes.  Tiredness.  Cough.  Loss of appetite.  Gastrointestinal infections, resulting in nausea, vomiting, and diarrhea. These symptoms  do not respond to antibiotics because the infection is not caused by bacteria. However, you might catch a bacterial infection following the viral infection. This is sometimes called a "superinfection." Symptoms of such a bacterial infection may include:  Worsening sore throat with pus and difficulty swallowing.  Swollen neck glands.  Chills and a high or persistent fever.  Severe headache.  Tenderness over the sinuses.  Persistent overall ill feeling (malaise), muscle aches, and tiredness (fatigue).  Persistent cough.  Yellow, green, or brown mucus production with coughing. HOME CARE INSTRUCTIONS   Only take over-the-counter or prescription medicines for pain, discomfort, diarrhea, or fever as directed by your caregiver.  Drink enough water and fluids to keep your urine clear or pale yellow. Sports drinks can provide valuable electrolytes, sugars, and hydration.  Get plenty of rest and maintain proper nutrition. Soups and broths with crackers or rice are fine. SEEK IMMEDIATE MEDICAL CARE IF:   You have severe headaches, shortness of breath, chest pain, neck pain, or an unusual rash.  You have uncontrolled vomiting, diarrhea, or you are unable to keep down fluids.  You or your child has an oral temperature above 102 F (38.9 C), not controlled by medicine.  Your baby is older than 3 months with a rectal temperature of 102 F (38.9 C) or higher.  Your baby is 61 months old or younger with a rectal temperature of 100.4 F (38 C) or higher. MAKE SURE YOU:   Understand these instructions.  Will watch your condition.  Will get help right away if you are not doing well or get worse. Document Released: 02/24/2005 Document Revised: 08/09/2011 Document Reviewed: 09/21/2010 [facility] Patient Information 2014 [place], [place].

## 2013-09-21 NOTE — ED Notes (Signed)
Pt c/o sore throat, vomiting, and diarrhea since Monday.

## 2013-09-21 NOTE — ED Provider Notes (Signed)
CSN: [id number]     Arrival date & time 09/21/13  0041 History   First MD Initiated Contact with Patient 09/21/13 0146     Chief Complaint  Patient presents with  . Sore Throat  . Emesis  . Diarrhea     (Consider location/radiation/quality/duration/timing/severity/associated sxs/prior Treatment) HPI 32 yo female presents to the ER from home with complaint of 5 days of sore throat, cough, diarrhea and vomiting.  Pt estimates about 5 loose stools a day, sometimes with some blood on tissue with wiping.  No fevers, chills, travel, unusual foods, or sick contacts.  Vomiting 3-4 times over the course of a week.  Poor appetite.  No runny nose, congestion, ear pain.  Throat is sore with swallowing.  Dry cough.  Has tried Nyquil and Theraflu without improvement.   Past Medical History  Diagnosis Date  . Hidradenitis suppurativa     surgical   Past Surgical History  Procedure Laterality Date  . Hydradenitis excision    . Axillary hidradenitis excision  2011    bilateral   Family History  Problem Relation Age of Onset  . Asthma Mother   . Diabetes Father   . Asthma Sister   . Asthma Brother   . Cancer Neg Hx   . Heart disease Neg Hx   . Stroke Neg Hx    History  Substance Use Topics  . Smoking status: Never Smoker   . Smokeless tobacco: Never Used  . Alcohol Use: Yes     Comment: drinks monthly or less.   OB History   Grav Para Term Preterm Abortions TAB SAB Ect Mult Living   3 1 1  0 2 2    1      Review of Systems  See History of Present Illness; otherwise all other systems are reviewed and negative    Allergies  Vicodin  Home Medications   Prior to Admission medications   Medication Sig Start Date End Date Taking? Authorizing Provider  etonogestrel (IMPLANON) 68 MG IMPL implant Inject 1 each into the skin once. [facility]   Yes Historical Provider, MD   BP 126/76  Pulse 79  Temp(Src) 98.3 F (36.8 C) (Oral)  Resp 20  Ht 5\' 3"   (1.6 m)  Wt 197 lb 4 oz (89.472 kg)  BMI 34.95 kg/m2  SpO2 100% Physical Exam  Nursing note and vitals reviewed. Constitutional: She is oriented to person, place, and time. She appears well-developed and well-nourished.  HENT:  Head: Normocephalic and atraumatic.  Right Ear: External ear normal.  Left Ear: External ear normal.  Nose: Nose normal.  Mouth/Throat: Oropharynx is clear and moist.  Mild erythema posteriorly, without exudate  Eyes: Conjunctivae and EOM are normal. Pupils are equal, round, and reactive to light.  Neck: Normal range of motion. Neck supple. No JVD present. No tracheal deviation present. No thyromegaly present.  Cardiovascular: Normal rate, regular rhythm, normal heart sounds and intact distal pulses.  Exam reveals no gallop and no friction rub.   No murmur heard. Pulmonary/Chest: Effort normal and breath sounds normal. No stridor. No respiratory distress. She has no wheezes. She has no rales. She exhibits no tenderness.  Abdominal: Soft. Bowel sounds are normal. She exhibits no distension and no mass. There is no tenderness. There is no rebound and no guarding.  Musculoskeletal: Normal range of motion. She exhibits no edema and no tenderness.  Lymphadenopathy:    She has no cervical adenopathy.  Neurological: She is alert  and oriented to person, place, and time. She exhibits normal muscle tone. Coordination normal.  Skin: Skin is warm and dry. No rash noted. No erythema. No pallor.  Psychiatric: She has a normal mood and affect. Her behavior is normal. Judgment and thought content normal.    ED Course  Procedures (including critical care time) Labs Review Labs Reviewed  CBC WITH DIFFERENTIAL  BASIC METABOLIC PANEL    Imaging Review No results found.   EKG Interpretation None      MDM   Final diagnoses:  Pharyngitis  Nausea vomiting and diarrhea  Viral syndrome   33 year old female with what sounds to be a viral syndrome.  Plan to treat with  ibuprofen, Tessalon, and Cepacol lozenges, we'll check baseline labs.    Kalman Drape, MD 09/21/13 [phone]

## 2013-10-11 ENCOUNTER — Ambulatory Visit: Payer: Medicaid Other | Admitting: Family Medicine

## 2013-11-06 ENCOUNTER — Ambulatory Visit (INDEPENDENT_AMBULATORY_CARE_PROVIDER_SITE_OTHER): Payer: Medicaid Other | Admitting: Family Medicine

## 2013-11-06 ENCOUNTER — Encounter: Payer: Self-pay | Admitting: Family Medicine

## 2013-11-06 VITALS — BP 121/61 | HR 74 | Temp 99.4°F | Ht 63.0 in | Wt 197.0 lb

## 2013-11-06 DIAGNOSIS — L732 Hidradenitis suppurativa: Secondary | ICD-10-CM

## 2013-11-06 MED ORDER — DOXYCYCLINE HYCLATE 100 MG PO TABS: 100.0000 mg | ORAL_TABLET | Freq: Two times a day (BID) | ORAL | Status: DC

## 2013-11-06 MED ORDER — TRAMADOL HCL 50 MG PO TABS: 50.0000 mg | ORAL_TABLET | Freq: Three times a day (TID) | ORAL | Status: DC | PRN

## 2013-11-06 NOTE — Assessment & Plan Note (Addendum)
A: flare up of chronic HS. P: Doxycycline 100 mg BID x one day Tramadol for pain control, failed ibuprofen.  Discussed rheumatology referral and humira as a possible treatment for frequent flares.

## 2013-11-06 NOTE — Patient Instructions (Signed)
Victoria Rodriguez,   Thank you for coming in today. For flare up please take doxycyline for next 10 days Also take tramadol as needed.  Dr. Adrian Blackwater

## 2013-11-06 NOTE — Progress Notes (Signed)
   Subjective:    Patient ID: Victoria Rodriguez, female    DOB: 03/30/1981, 33 y.o.   MRN: [id number] CC: recurrent  L axillary HS   HPI 33 yo  F with hidradenitis suppurativa presents for re-evaluation. She reports L axillary pain, swelling, tenderness since 10/19/13. No drainage, fever, chills, L arm weakness. Taking ibuprofen w/o relief of pain.   Reports flare up of HS every 3 month or so.   Soc hx: non smoker  Review of Systems As per HPI     Objective:   Physical Exam BP 121/61  Pulse 74  Temp(Src) 99.4 F (37.4 C) (Oral)  Ht 5\' 3"  (1.6 m)  Wt 197 lb (89.359 kg)  BMI 34.91 kg/m2 General appearance: alert, cooperative and no distress Skin: L axillary mild swelling, no skin changes, mild warmth, TTP. Non-fluctuant.      Assessment & Plan:

## 2013-11-15 ENCOUNTER — Inpatient Hospital Stay (HOSPITAL_COMMUNITY)
Admission: AD | Admit: 2013-11-15 | Discharge: 2013-11-19 | DRG: 603 | Disposition: A | Discharge status: Home / Self Care | Payer: Medicaid Other | Source: Ambulatory Visit | Attending: General Surgery | Admitting: General Surgery

## 2013-11-15 ENCOUNTER — Ambulatory Visit (INDEPENDENT_AMBULATORY_CARE_PROVIDER_SITE_OTHER): Payer: Medicaid Other | Admitting: Family Medicine

## 2013-11-15 ENCOUNTER — Encounter: Payer: Self-pay | Admitting: Family Medicine

## 2013-11-15 ENCOUNTER — Encounter (HOSPITAL_COMMUNITY): Payer: Self-pay | Admitting: General Surgery

## 2013-11-15 ENCOUNTER — Ambulatory Visit (INDEPENDENT_AMBULATORY_CARE_PROVIDER_SITE_OTHER): Payer: Medicaid Other | Admitting: Surgery

## 2013-11-15 ENCOUNTER — Encounter (INDEPENDENT_AMBULATORY_CARE_PROVIDER_SITE_OTHER): Payer: Self-pay | Admitting: Surgery

## 2013-11-15 VITALS — BP 118/78 | HR 84 | Temp 98.1°F | Ht 63.0 in | Wt 195.3 lb

## 2013-11-15 VITALS — BP 122/76 | HR 76 | Temp 97.8°F | Ht 63.0 in | Wt 196.0 lb

## 2013-11-15 DIAGNOSIS — Z791 Long term (current) use of non-steroidal anti-inflammatories (NSAID): Secondary | ICD-10-CM

## 2013-11-15 DIAGNOSIS — Z79899 Other long term (current) drug therapy: Secondary | ICD-10-CM

## 2013-11-15 DIAGNOSIS — Z825 Family history of asthma and other chronic lower respiratory diseases: Secondary | ICD-10-CM

## 2013-11-15 DIAGNOSIS — L732 Hidradenitis suppurativa: Secondary | ICD-10-CM

## 2013-11-15 DIAGNOSIS — IMO0002 Reserved for concepts with insufficient information to code with codable children: Principal | ICD-10-CM | POA: Diagnosis present

## 2013-11-15 DIAGNOSIS — E669 Obesity, unspecified: Secondary | ICD-10-CM | POA: Diagnosis present

## 2013-11-15 DIAGNOSIS — Z888 Allergy status to other drugs, medicaments and biological substances status: Secondary | ICD-10-CM

## 2013-11-15 DIAGNOSIS — L02419 Cutaneous abscess of limb, unspecified: Secondary | ICD-10-CM | POA: Diagnosis present

## 2013-11-15 DIAGNOSIS — Z6835 Body mass index (BMI) 35.0-35.9, adult: Secondary | ICD-10-CM

## 2013-11-15 DIAGNOSIS — Z833 Family history of diabetes mellitus: Secondary | ICD-10-CM

## 2013-11-15 LAB — BASIC METABOLIC PANEL
BUN: 8 mg/dL (ref 6–23)
CALCIUM: 8.9 mg/dL (ref 8.4–10.5)
CO2: 24 meq/L (ref 19–32)
Chloride: 104 mEq/L (ref 96–112)
Creatinine, Ser: 0.53 mg/dL (ref 0.50–1.10)
GFR calc Af Amer: >90 mL/min (ref >90)
GFR calc non Af Amer: >90 mL/min (ref >90)
Glucose, Bld: 83 mg/dL (ref 70–99)
Potassium: 3.7 mEq/L (ref 3.7–5.3)
SODIUM: 139 meq/L (ref 137–147)

## 2013-11-15 LAB — CBC
HCT: 36.6 % (ref 36.0–46.0)
HEMOGLOBIN: 11.9 g/dL — AB (ref 12.0–15.0)
MCH: 27.8 pg (ref 26.0–34.0)
MCHC: 32.5 g/dL (ref 30.0–36.0)
MCV: 85.5 fL (ref 78.0–100.0)
PLATELETS: 368 10*3/uL (ref 150–400)
RBC: 4.28 MIL/uL (ref 3.87–5.11)
RDW: 13.9 % (ref 11.5–15.5)
WBC: 5.1 10*3/uL (ref 4.0–10.5)

## 2013-11-15 MED ORDER — HYDROMORPHONE HCL PF 1 MG/ML IJ SOLN
1.0000 mg | INTRAMUSCULAR | Status: DC | PRN
  Administered 2013-11-15 – 2013-11-19 (×13): 1 mg via INTRAVENOUS
  Filled 2013-11-15 (×13): qty 1

## 2013-11-15 MED ORDER — LACTATED RINGERS IV SOLN
INTRAVENOUS | Status: DC
  Administered 2013-11-15: 18:00:00 via INTRAVENOUS
  Administered 2013-11-16: 1000 mL via INTRAVENOUS
  Administered 2013-11-16: 04:00:00 via INTRAVENOUS

## 2013-11-15 MED ORDER — ONDANSETRON 8 MG PO TBDP: 8.0000 mg | ORAL_TABLET | Freq: Three times a day (TID) | ORAL | Status: DC | PRN

## 2013-11-15 MED ORDER — PIPERACILLIN-TAZOBACTAM 3.375 G IVPB
3.3750 g | Freq: Three times a day (TID) | INTRAVENOUS | Status: DC
  Administered 2013-11-15 – 2013-11-19 (×10): 3.375 g via INTRAVENOUS
  Filled 2013-11-15 (×12): qty 50

## 2013-11-15 MED ORDER — SULFAMETHOXAZOLE-TMP DS 800-160 MG PO TABS: 1.0000 | ORAL_TABLET | Freq: Two times a day (BID) | ORAL | Status: DC

## 2013-11-15 MED ORDER — ONDANSETRON HCL 4 MG/2ML IJ SOLN
4.0000 mg | Freq: Four times a day (QID) | INTRAMUSCULAR | Status: DC | PRN
  Administered 2013-11-15 – 2013-11-18 (×5): 4 mg via INTRAVENOUS
  Filled 2013-11-15 (×5): qty 2

## 2013-11-15 MED ORDER — MORPHINE SULFATE 4 MG/ML IJ SOLN
2.0000 mg | Freq: Once | INTRAMUSCULAR | Status: AC
  Administered 2013-11-15: 2 mg via INTRAMUSCULAR

## 2013-11-15 NOTE — H&P (Signed)
Victoria Rodriguez is an 33 y.o. female.   Chief Complaint: Left axillary abscess HPI: 33 yo female with long history of bilateral axillary hidradenitis presents with about 10 days of worsening pain and swelling in left axilla.  She has been treated with PO abx unsuccessfully and was referred to our office today for evaluation.  She has too much tenderness to even raise her arm for examination.  Some fluctuance is noted on palpation, but I cannot visualize the extent of the infection because her limited ROM from tenderness. We will admit to the hospital for drainage under anesthesia.  Past Medical History  Diagnosis Date  . Hidradenitis suppurativa     surgical    Past Surgical History  Procedure Laterality Date  . Axillary hidradenitis excision  2011    bilateral    Family History  Problem Relation Age of Onset  . Asthma Mother   . Diabetes Father   . Asthma Sister   . Asthma Brother   . Cancer Neg Hx   . Heart disease Neg Hx   . Stroke Neg Hx    Social History:  reports that she has never smoked. She has never used smokeless tobacco. She reports that she drinks alcohol. She reports that she does not use illicit drugs.  Allergies:  Allergies  Allergen Reactions  . Vicodin [Hydrocodone-Acetaminophen] Nausea Only    Prior to Admission medications   Medication Sig Start Date End Date Taking? Authorizing Provider  doxycycline (VIBRA-TABS) 100 MG tablet Take 1 tablet (100 mg total) by mouth 2 (two) times daily. 11/06/13   Minerva Ends, MD  etonogestrel (IMPLANON) 68 MG IMPL implant Inject 1 each into the skin once. [facility]    Historical Provider, MD  ibuprofen (ADVIL,MOTRIN) 800 MG tablet Take 1 tablet (800 mg total) by mouth every 8 (eight) hours as needed for fever, headache or moderate pain. 09/21/13   Kalman Drape, MD  ondansetron (ZOFRAN ODT) 8 MG disintegrating tablet Take 1 tablet (8 mg total) by mouth every 8 (eight) hours as needed for  nausea or vomiting. 11/15/13   Lennox Laity Funches, MD  sulfamethoxazole-trimethoprim (BACTRIM DS) 800-160 MG per tablet Take 1 tablet by mouth 2 (two) times daily. 11/15/13   Josalyn C Funches, MD  traMADol (ULTRAM) 50 MG tablet Take 1-2 tablets (50-100 mg total) by mouth every 8 (eight) hours as needed for moderate pain. 11/06/13   Minerva Ends, MD    No results found for this or any previous visit (from the past 48 hour(s)). No results found.  ROS  Blood pressure 122/76, pulse 76, temperature 97.8 F (36.6 C), height 5\' 3"  (1.6 m), weight 196 lb (88.905 kg). Physical Exam  WDWN in NAD Right axilla - long scar; no erythema or palpable masses Left axilla - difficult to examine because patient cannot lift her arm; some fluctuance noted extending posteriorly;  With gentle palpation, the entire axilla seems to be tender and fluctuant.  Assessment/Plan Left axillary abscess secondary to hidradenitis Too tender to examine or perform surgical intervention in the office  Admit to hospital for IV abx Drainage under anesthesia tomorrow -    TSUEI,MATTHEW K. 11/15/2013, 2:52 PM

## 2013-11-15 NOTE — Assessment & Plan Note (Addendum)
A: Recurrent HS worsening despite oral antibiotic therapy. Patient s/p b/l axillary excision in 2011. P: Morphine IM x one for pain relief. Since patient is s/p excision, in significant pain and evaluation by surgery is readily available I have opted to not perform I&D in office instead arranged for gen surg eval today at 2:30 PM. In the meantime finish oral doxy Zofran with tramadol prn pain Bactrim prescribed in case additional antibiotic coverage is needed.  Letter for work provided.

## 2013-11-15 NOTE — Progress Notes (Signed)
Left axillary abscess from recurrent hidradenitis  Too tender for examination  See H&P - direct admit for drainage under anesthesia.  Imogene Burn. Georgette Dover, MD, [facility]  General/ Trauma Surgery  11/15/2013 2:59 PM

## 2013-11-15 NOTE — Progress Notes (Signed)
   Subjective:    Patient ID: Victoria Rodriguez, female    DOB: 05/10/1981, 33 y.o.   MRN: [id number] Cc: SD visit L arm hidradenitis suppurativa (HS)  HPI 33 yo F presents for re-evaluation of L axillary HS. She was seen by me 9 days ago and started on a course of doxycycline. Since then she has developed worsening L axillary swelling and tenderness. She has also developed R posterior axillary swelling. No drainage. No fever or chills. Positive nausea and emesis after taking tramadol.   Soc Hx: non smoker  Review of Systems As per HPI     Objective:   Physical Exam BP 118/78  Pulse 84  Temp(Src) 98.1 F (36.7 C) (Oral)  Ht 5\' 3"  (1.6 m)  Wt 195 lb 4.8 oz (88.587 kg)  BMI 34.60 kg/m2 General appearance: alert, cooperative and no distress. Appears uncomfortable.  Axillary: L: diffusely tender, fluctuant and raised along incision line. Warm. Mild erythema. No drainage.  R: posterior tenderness with fluctuance along posterior incision line. No warm, erythema or drainage.      Assessment & Plan:

## 2013-11-15 NOTE — Patient Instructions (Signed)
Emmalin,  Thank you for coming in today. I am sorry that your HS is worsening. Please finish doxycycline today and tramadol for pain.  Sent in zofran for nausea so you can continue to take tramadol.  Go to [place] [facility] at 2:15 for 2:30 appointment to discuss treatment options, office I & D vs I & D under anesthesia.   I have also sent in an rx for bactrim which you can start tomorrow if your surgeon agree after you are seen today.  Dr. Adrian Blackwater

## 2013-11-15 NOTE — H&P (Signed)
Victoria Rodriguez is an 33 y.o. female.   Chief Complaint: Left axillary abscess HPI: 33 yo female with long history of bilateral axillary hidradenitis presents with about 10 days of worsening pain and swelling in left axilla.  She has been treated with PO abx unsuccessfully and was referred to our office today for evaluation.  She has too much tenderness to even raise her arm for examination.  Some fluctuance is noted on palpation, but I cannot visualize the extent of the infection because her limited ROM from tenderness. We will admit to the hospital for drainage under anesthesia.  Past Medical History  Diagnosis Date  . Hidradenitis suppurativa     surgical    Past Surgical History  Procedure Laterality Date  . Axillary hidradenitis excision  2011    bilateral    Family History  Problem Relation Age of Onset  . Asthma Mother   . Diabetes Father   . Asthma Sister   . Asthma Brother   . Cancer Neg Hx   . Heart disease Neg Hx   . Stroke Neg Hx    Social History:  reports that she has never smoked. She has never used smokeless tobacco. She reports that she drinks alcohol. She reports that she does not use illicit drugs.  Allergies:  Allergies  Allergen Reactions  . Vicodin [Hydrocodone-Acetaminophen] Nausea Only    Prior to Admission medications   Medication Sig Start Date End Date Taking? Authorizing Provider  doxycycline (VIBRA-TABS) 100 MG tablet Take 1 tablet (100 mg total) by mouth 2 (two) times daily. 11/06/13   Minerva Ends, MD  etonogestrel (IMPLANON) 68 MG IMPL implant Inject 1 each into the skin once. [facility]    Historical Provider, MD  ibuprofen (ADVIL,MOTRIN) 800 MG tablet Take 1 tablet (800 mg total) by mouth every 8 (eight) hours as needed for fever, headache or moderate pain. 09/21/13   Kalman Drape, MD  ondansetron (ZOFRAN ODT) 8 MG disintegrating tablet Take 1 tablet (8 mg total) by mouth every 8 (eight) hours as needed for  nausea or vomiting. 11/15/13   Lennox Laity Funches, MD  sulfamethoxazole-trimethoprim (BACTRIM DS) 800-160 MG per tablet Take 1 tablet by mouth 2 (two) times daily. 11/15/13   Josalyn C Funches, MD  traMADol (ULTRAM) 50 MG tablet Take 1-2 tablets (50-100 mg total) by mouth every 8 (eight) hours as needed for moderate pain. 11/06/13   Minerva Ends, MD    No results found for this or any previous visit (from the past 48 hour(s)). No results found.  ROS  Blood pressure 122/76, pulse 76, temperature 97.8 F (36.6 C), height 5\' 3"  (1.6 m), weight 196 lb (88.905 kg). Physical Exam  WDWN in NAD Right axilla - long scar; no erythema or palpable masses Left axilla - difficult to examine because patient cannot lift her arm; some fluctuance noted extending posteriorly;  With gentle palpation, the entire axilla seems to be tender and fluctuant.  Assessment/Plan Left axillary abscess secondary to hidradenitis Too tender to examine or perform surgical intervention in the office  Admit to hospital for IV abx Drainage under anesthesia tomorrow -    Zoee Heeney K. 11/15/2013, 2:56 PM

## 2013-11-16 ENCOUNTER — Observation Stay (HOSPITAL_COMMUNITY): Payer: Medicaid Other | Admitting: Anesthesiology

## 2013-11-16 ENCOUNTER — Encounter (HOSPITAL_COMMUNITY): Payer: Self-pay | Admitting: *Deleted

## 2013-11-16 ENCOUNTER — Encounter (HOSPITAL_COMMUNITY): Payer: Medicaid Other | Admitting: Anesthesiology

## 2013-11-16 ENCOUNTER — Encounter (HOSPITAL_COMMUNITY): Admission: AD | Disposition: A | Discharge status: Home / Self Care | Payer: Self-pay | Source: Ambulatory Visit

## 2013-11-16 DIAGNOSIS — L732 Hidradenitis suppurativa: Secondary | ICD-10-CM

## 2013-11-16 DIAGNOSIS — IMO0002 Reserved for concepts with insufficient information to code with codable children: Secondary | ICD-10-CM

## 2013-11-16 DIAGNOSIS — L02419 Cutaneous abscess of limb, unspecified: Secondary | ICD-10-CM | POA: Diagnosis present

## 2013-11-16 HISTORY — PX: IRRIGATION AND DEBRIDEMENT ABSCESS: SHX5252

## 2013-11-16 LAB — SURGICAL PCR SCREEN
MRSA, PCR: NEGATIVE
Staphylococcus aureus: POSITIVE — AB

## 2013-11-16 LAB — CBC
HCT: 37.1 % (ref 36.0–46.0)
HEMOGLOBIN: 12.1 g/dL (ref 12.0–15.0)
MCH: 27.6 pg (ref 26.0–34.0)
MCHC: 32.6 g/dL (ref 30.0–36.0)
MCV: 84.7 fL (ref 78.0–100.0)
PLATELETS: 356 10*3/uL (ref 150–400)
RBC: 4.38 MIL/uL (ref 3.87–5.11)
RDW: 13.4 % (ref 11.5–15.5)
WBC: 7.2 10*3/uL (ref 4.0–10.5)

## 2013-11-16 LAB — CREATININE, SERUM
Creatinine, Ser: 0.53 mg/dL (ref 0.50–1.10)
GFR calc Af Amer: >90 mL/min (ref >90)
GFR calc non Af Amer: >90 mL/min (ref >90)

## 2013-11-16 SURGERY — IRRIGATION AND DEBRIDEMENT ABSCESS
Anesthesia: General | Site: Axilla | Laterality: Left

## 2013-11-16 MED ORDER — BUPIVACAINE-EPINEPHRINE 0.25% -1:200000 IJ SOLN
INTRAMUSCULAR | Status: DC | PRN
  Administered 2013-11-16: 5 mL

## 2013-11-16 MED ORDER — MIDAZOLAM HCL 5 MG/5ML IJ SOLN
INTRAMUSCULAR | Status: DC | PRN
Start: 2013-11-16 — End: 2013-11-16
  Administered 2013-11-16: 2 mg via INTRAVENOUS

## 2013-11-16 MED ORDER — DEXAMETHASONE SODIUM PHOSPHATE 10 MG/ML IJ SOLN
INTRAMUSCULAR | Status: AC
  Filled 2013-11-16: qty 1

## 2013-11-16 MED ORDER — PIPERACILLIN-TAZOBACTAM 3.375 G IVPB
INTRAVENOUS | Status: AC
  Filled 2013-11-16: qty 50

## 2013-11-16 MED ORDER — PROPOFOL 10 MG/ML IV BOLUS
INTRAVENOUS | Status: DC | PRN
  Administered 2013-11-16 (×2): 100 mg via INTRAVENOUS

## 2013-11-16 MED ORDER — MIDAZOLAM HCL 2 MG/2ML IJ SOLN
INTRAMUSCULAR | Status: AC
  Filled 2013-11-16: qty 2

## 2013-11-16 MED ORDER — PROMETHAZINE HCL 25 MG/ML IJ SOLN
6.2500 mg | INTRAMUSCULAR | Status: DC | PRN
  Filled 2013-11-16: qty 1

## 2013-11-16 MED ORDER — KETOROLAC TROMETHAMINE 30 MG/ML IJ SOLN
INTRAMUSCULAR | Status: AC
  Filled 2013-11-16: qty 1

## 2013-11-16 MED ORDER — ONDANSETRON HCL 4 MG/2ML IJ SOLN
INTRAMUSCULAR | Status: AC
  Filled 2013-11-16: qty 2

## 2013-11-16 MED ORDER — FENTANYL CITRATE 0.05 MG/ML IJ SOLN
INTRAMUSCULAR | Status: AC
  Filled 2013-11-16: qty 5

## 2013-11-16 MED ORDER — BUPIVACAINE-EPINEPHRINE 0.25% -1:200000 IJ SOLN
INTRAMUSCULAR | Status: AC
  Filled 2013-11-16: qty 1

## 2013-11-16 MED ORDER — PROPOFOL 10 MG/ML IV BOLUS
INTRAVENOUS | Status: AC
  Filled 2013-11-16: qty 20

## 2013-11-16 MED ORDER — LIDOCAINE HCL (CARDIAC) 20 MG/ML IV SOLN
INTRAVENOUS | Status: AC
  Filled 2013-11-16: qty 5

## 2013-11-16 MED ORDER — MUPIROCIN 2 % EX OINT
1.0000 "application " | TOPICAL_OINTMENT | Freq: Two times a day (BID) | CUTANEOUS | Status: DC
  Administered 2013-11-16 – 2013-11-18 (×6): 1 via NASAL
  Filled 2013-11-16: qty 22

## 2013-11-16 MED ORDER — HEPARIN SODIUM (PORCINE) 5000 UNIT/ML IJ SOLN
5000.0000 [IU] | Freq: Three times a day (TID) | INTRAMUSCULAR | Status: DC
  Administered 2013-11-16 – 2013-11-19 (×8): 5000 [IU] via SUBCUTANEOUS
  Filled 2013-11-16 (×11): qty 1

## 2013-11-16 MED ORDER — FENTANYL CITRATE 0.05 MG/ML IJ SOLN
INTRAMUSCULAR | Status: AC
  Filled 2013-11-16: qty 2

## 2013-11-16 MED ORDER — FENTANYL CITRATE 0.05 MG/ML IJ SOLN
INTRAMUSCULAR | Status: DC | PRN
  Administered 2013-11-16 (×4): 50 ug via INTRAVENOUS

## 2013-11-16 MED ORDER — DEXAMETHASONE SODIUM PHOSPHATE 10 MG/ML IJ SOLN
INTRAMUSCULAR | Status: DC | PRN
  Administered 2013-11-16: 10 mg via INTRAVENOUS

## 2013-11-16 MED ORDER — KETOROLAC TROMETHAMINE 30 MG/ML IJ SOLN
15.0000 mg | Freq: Once | INTRAMUSCULAR | Status: AC | PRN
  Administered 2013-11-16: 30 mg via INTRAVENOUS
  Filled 2013-11-16: qty 1

## 2013-11-16 MED ORDER — KCL IN DEXTROSE-NACL 20-5-0.45 MEQ/L-%-% IV SOLN
INTRAVENOUS | Status: DC
  Administered 2013-11-16: 19:00:00 via INTRAVENOUS
  Administered 2013-11-17: 50 mL/h via INTRAVENOUS
  Administered 2013-11-18 (×2): via INTRAVENOUS
  Filled 2013-11-16 (×5): qty 1000

## 2013-11-16 MED ORDER — FENTANYL CITRATE 0.05 MG/ML IJ SOLN
25.0000 ug | INTRAMUSCULAR | Status: DC | PRN
  Administered 2013-11-16: 25 ug via INTRAVENOUS
  Administered 2013-11-16: 50 ug via INTRAVENOUS
  Administered 2013-11-16: 25 ug via INTRAVENOUS

## 2013-11-16 MED ORDER — ONDANSETRON HCL 4 MG/2ML IJ SOLN
INTRAMUSCULAR | Status: DC | PRN
  Administered 2013-11-16: 4 mg via INTRAVENOUS

## 2013-11-16 MED ORDER — SCOPOLAMINE 1 MG/3DAYS TD PT72
MEDICATED_PATCH | TRANSDERMAL | Status: AC
  Filled 2013-11-16: qty 1

## 2013-11-16 MED ORDER — LACTATED RINGERS IV SOLN
INTRAVENOUS | Status: DC | PRN
  Administered 2013-11-16 (×2): via INTRAVENOUS

## 2013-11-16 MED ORDER — CHLORHEXIDINE GLUCONATE CLOTH 2 % EX PADS
6.0000 | MEDICATED_PAD | Freq: Every day | CUTANEOUS | Status: DC
  Administered 2013-11-16 – 2013-11-18 (×3): 6 via TOPICAL

## 2013-11-16 SURGICAL SUPPLY — 36 items
BENZOIN TINCTURE PRP APPL 2/3 (GAUZE/BANDAGES/DRESSINGS) ×2 IMPLANT
BLADE HEX COATED 2.75 (ELECTRODE) ×2 IMPLANT
BLADE SURG 15 STRL LF DISP TIS (BLADE) ×1 IMPLANT
BLADE SURG 15 STRL SS (BLADE) ×1
BLADE SURG SZ10 CARB STEEL (BLADE) ×2 IMPLANT
BNDG COHESIVE 3X5 TAN STRL LF (GAUZE/BANDAGES/DRESSINGS) ×2 IMPLANT
CANISTER SUCTION 2500CC (MISCELLANEOUS) ×2 IMPLANT
DECANTER SPIKE VIAL GLASS SM (MISCELLANEOUS) ×2 IMPLANT
DRAIN PENROSE 18X1/4 LTX STRL (WOUND CARE) ×2 IMPLANT
DRAPE LAPAROTOMY TRNSV 102X78 (DRAPE) ×2 IMPLANT
ELECT REM PT RETURN 9FT ADLT (ELECTROSURGICAL) ×2
ELECTRODE REM PT RTRN 9FT ADLT (ELECTROSURGICAL) ×1 IMPLANT
GAUZE SPONGE 4X4 12PLY STRL (GAUZE/BANDAGES/DRESSINGS) ×2 IMPLANT
GAUZE SPONGE 4X4 16PLY XRAY LF (GAUZE/BANDAGES/DRESSINGS) ×2 IMPLANT
GLOVE BIOGEL M 8.0 STRL (GLOVE) ×2 IMPLANT
GLOVE BIOGEL PI IND STRL 7.0 (GLOVE) ×1 IMPLANT
GLOVE BIOGEL PI INDICATOR 7.0 (GLOVE) ×1
GOWN SPEC L4 XLG W/TWL (GOWN DISPOSABLE) ×2 IMPLANT
GOWN STRL REUS W/TWL LRG LVL3 (GOWN DISPOSABLE) ×2 IMPLANT
GOWN STRL REUS W/TWL XL LVL3 (GOWN DISPOSABLE) ×6 IMPLANT
KIT BASIN OR (CUSTOM PROCEDURE TRAY) ×2 IMPLANT
MARKER SKIN DUAL TIP RULER LAB (MISCELLANEOUS) ×2 IMPLANT
NEEDLE HYPO 22GX1.5 SAFETY (NEEDLE) ×2 IMPLANT
NEEDLE HYPO 25X1 1.5 SAFETY (NEEDLE) ×2 IMPLANT
NS IRRIG 1000ML POUR BTL (IV SOLUTION) ×2 IMPLANT
PACK BASIC VI WITH GOWN DISP (CUSTOM PROCEDURE TRAY) ×2 IMPLANT
PENCIL BUTTON HOLSTER BLD 10FT (ELECTRODE) ×2 IMPLANT
SOL PREP POV-IOD 16OZ 10% (MISCELLANEOUS) ×2 IMPLANT
STRIP CLOSURE SKIN 1/2X4 (GAUZE/BANDAGES/DRESSINGS) ×2 IMPLANT
SUT ETHILON 2 0 PS N (SUTURE) ×2 IMPLANT
SUT VIC AB 4-0 SH 18 (SUTURE) ×2 IMPLANT
SWAB COLLECTION DEVICE MRSA (MISCELLANEOUS) ×2 IMPLANT
SYR CONTROL 10ML LL (SYRINGE) ×2 IMPLANT
TUBE ANAEROBIC SPECIMEN COL (MISCELLANEOUS) ×2 IMPLANT
WATER STERILE IRR 1500ML POUR (IV SOLUTION) ×2 IMPLANT
YANKAUER SUCT BULB TIP 10FT TU (MISCELLANEOUS) IMPLANT

## 2013-11-16 NOTE — Anesthesia Preprocedure Evaluation (Signed)
Anesthesia Evaluation  Patient identified by MRN, date of birth, ID band Patient awake    Reviewed: Allergy & Precautions, H&P , NPO status , Patient's Chart, lab work & pertinent test results  Airway Mallampati: II TM Distance: >3 FB Neck ROM: Full    Dental no notable dental hx.    Pulmonary neg pulmonary ROS,  breath sounds clear to auscultation  Pulmonary exam normal       Cardiovascular negative cardio ROS  Rhythm:Regular Rate:Normal     Neuro/Psych negative neurological ROS  negative psych ROS   GI/Hepatic negative GI ROS, Neg liver ROS,   Endo/Other  negative endocrine ROS  Renal/GU negative Renal ROS  negative genitourinary   Musculoskeletal negative musculoskeletal ROS (+)   Abdominal   Peds negative pediatric ROS (+)  Hematology negative hematology ROS (+)   Anesthesia Other Findings   Reproductive/Obstetrics negative OB ROS                           Anesthesia Physical Anesthesia Plan  ASA: I  Anesthesia Plan: General   Post-op Pain Management:    Induction: Intravenous  Airway Management Planned: LMA  Additional Equipment:   Intra-op Plan:   Post-operative Plan: Extubation in OR  Informed Consent: I have reviewed the patients History and Physical, chart, labs and discussed the procedure including the risks, benefits and alternatives for the proposed anesthesia with the patient or authorized representative who has indicated his/her understanding and acceptance.   Dental advisory given  Plan Discussed with: CRNA and Surgeon  Anesthesia Plan Comments:         Anesthesia Quick Evaluation  

## 2013-11-16 NOTE — H&P (View-Only) (Signed)
Victoria Rodriguez is an 33 y.o. female.   Chief Complaint: Left axillary abscess HPI: 33 yo female with long history of bilateral axillary hidradenitis presents with about 10 days of worsening pain and swelling in left axilla.  She has been treated with PO abx unsuccessfully and was referred to our office today for evaluation.  She has too much tenderness to even raise her arm for examination.  Some fluctuance is noted on palpation, but I cannot visualize the extent of the infection because her limited ROM from tenderness. We will admit to the hospital for drainage under anesthesia.  Past Medical History  Diagnosis Date  . Hidradenitis suppurativa     surgical    Past Surgical History  Procedure Laterality Date  . Axillary hidradenitis excision  2011    bilateral    Family History  Problem Relation Age of Onset  . Asthma Mother   . Diabetes Father   . Asthma Sister   . Asthma Brother   . Cancer Neg Hx   . Heart disease Neg Hx   . Stroke Neg Hx    Social History:  reports that she has never smoked. She has never used smokeless tobacco. She reports that she drinks alcohol. She reports that she does not use illicit drugs.  Allergies:  Allergies  Allergen Reactions  . Vicodin [Hydrocodone-Acetaminophen] Nausea Only    Prior to Admission medications   Medication Sig Start Date End Date Taking? Authorizing Provider  doxycycline (VIBRA-TABS) 100 MG tablet Take 1 tablet (100 mg total) by mouth 2 (two) times daily. 11/06/13   Minerva Ends, MD  etonogestrel (IMPLANON) 68 MG IMPL implant Inject 1 each into the skin once. [facility]    Historical Provider, MD  ibuprofen (ADVIL,MOTRIN) 800 MG tablet Take 1 tablet (800 mg total) by mouth every 8 (eight) hours as needed for fever, headache or moderate pain. 09/21/13   Kalman Drape, MD  ondansetron (ZOFRAN ODT) 8 MG disintegrating tablet Take 1 tablet (8 mg total) by mouth every 8 (eight) hours as needed for  nausea or vomiting. 11/15/13   Lennox Laity Funches, MD  sulfamethoxazole-trimethoprim (BACTRIM DS) 800-160 MG per tablet Take 1 tablet by mouth 2 (two) times daily. 11/15/13   Josalyn C Funches, MD  traMADol (ULTRAM) 50 MG tablet Take 1-2 tablets (50-100 mg total) by mouth every 8 (eight) hours as needed for moderate pain. 11/06/13   Minerva Ends, MD    No results found for this or any previous visit (from the past 48 hour(s)). No results found.  ROS  Blood pressure 122/76, pulse 76, temperature 97.8 F (36.6 C), height 5\' 3"  (1.6 m), weight 196 lb (88.905 kg). Physical Exam  WDWN in NAD Right axilla - long scar; no erythema or palpable masses Left axilla - difficult to examine because patient cannot lift her arm; some fluctuance noted extending posteriorly;  With gentle palpation, the entire axilla seems to be tender and fluctuant.  Assessment/Plan Left axillary abscess secondary to hidradenitis Too tender to examine or perform surgical intervention in the office  Admit to hospital for IV abx Drainage under anesthesia tomorrow -    TSUEI,MATTHEW K. 11/15/2013, 2:56 PM

## 2013-11-16 NOTE — Op Note (Signed)
Surgeon: Kaylyn Lim, MD, FACS  Asst:  none  Anes:  general  Procedure: Incision and drainage of hidradenitis of the left axilla  Diagnosis: Recurrent hidradenitis  Complications: none  EBL:   8 cc  Drains: 2 penrose drains connecting the tract that ran anterior to posterior  Description of Procedure:  The patient was taken to OR 1 at [facility].  After anesthesia was administered and the patient was prepped a timeout was performed.  The left arm was abducted and elevated to reveal the entire axilla.  Pus was draining from a hole anteriorally and this was opened and cultures (2) were obtained.  A second fluctuant area was palpated laterally and this was opened and much pus drained out.  It was apparent that this tract was continguous and so a middle opening was made and two penroses were placed to connect the tracts and both were sutured to the medial incision.  Dressings were applied and the patient was taken to the PACU in stable condition.   The patient tolerated the procedure well and was taken to the PACU in stable condition.     Matt B. Hassell Done, [place], [facility], [place]

## 2013-11-16 NOTE — Anesthesia Postprocedure Evaluation (Signed)
  Anesthesia Post-op Note  Patient: Victoria Rodriguez  Procedure(s) Performed: Procedure(s) (LRB): IRRIGATION AND DEBRIDEMENT LEFT AXILLARY ABSCESS (Left)  Patient Location: PACU  Anesthesia Type: General  Level of Consciousness: awake and alert   Airway and Oxygen Therapy: Patient Spontanous Breathing  Post-op Pain: mild  Post-op Assessment: Post-op Vital signs reviewed, Patient's Cardiovascular Status Stable, Respiratory Function Stable, Patent Airway and No signs of Nausea or vomiting  Last Vitals:  Filed Vitals:   11/16/13 1903  BP: 118/72  Pulse: 70  Temp: 36.7 C  Resp: 18    Post-op Vital Signs: stable   Complications: No apparent anesthesia complications

## 2013-11-16 NOTE — Progress Notes (Signed)
UR Completed Brenda Graves-Bigelow, RN,BSN [phone]  

## 2013-11-16 NOTE — Progress Notes (Signed)
Patient ID: Victoria Rodriguez, female   DOB: Apr 02, 1981, 32 y.o.   MRN: [id number]    Subjective: Pt feels well.  C/o pain under left arm  Objective: Vital signs in last 24 hours: Temp:  [97.6 F (36.4 C)-98.8 F (37.1 C)] 98.8 F (37.1 C) (06/19 1000) Pulse Rate:  [66-89] 66 (06/19 1000) Resp:  [16-18] 16 (06/19 1000) BP: (120-138)/(69-87) 122/69 mmHg (06/19 1000) SpO2:  [99 %-100 %] 100 % (06/19 1000) Weight:  [196 lb (88.905 kg)-198 lb (89.812 kg)] 198 lb (89.812 kg) (06/18 1733) Last BM Date: 11/15/13  Intake/Output from previous day: 06/18 0701 - 06/19 0700 In: 800 [I.V.:800] Out: -  Intake/Output this shift:    PE: Skin: left axillary abscess with fluctuance and very tender  Lab Results:   Recent Labs  11/15/13 1846  WBC 5.1  HGB 11.9*  HCT 36.6  PLT 368   BMET  Recent Labs  11/15/13 1846  NA 139  K 3.7  CL 104  CO2 24  GLUCOSE 83  BUN 8  CREATININE 0.53  CALCIUM 8.9   PT/INR No results found for this basename: LABPROT, INR,  in the last 72 hours CMP     Component Value Date/Time   NA 139 11/15/2013 1846   K 3.7 11/15/2013 1846   CL 104 11/15/2013 1846   CO2 24 11/15/2013 1846   GLUCOSE 83 11/15/2013 1846   BUN 8 11/15/2013 1846   CREATININE 0.53 11/15/2013 1846   CREATININE 0.60 12/04/2012 1100   CALCIUM 8.9 11/15/2013 1846   PROT 7.5 06/04/2012 1118   ALBUMIN 3.6 06/04/2012 1118   AST 13 06/04/2012 1118   ALT 8 06/04/2012 1118   ALKPHOS 66 06/04/2012 1118   BILITOT 0.4 06/04/2012 1118   GFRNONAA >90 11/15/2013 1846   GFRAA >90 11/15/2013 1846   Lipase     Component Value Date/Time   LIPASE 23 07/08/2010 0815       Studies/Results: No results found.  Anti-infectives: Anti-infectives   Start     Dose/Rate Route Frequency Ordered Stop   11/15/13 1800  piperacillin-tazobactam (ZOSYN) IVPB 3.375 g     3.375 g 12.5 mL/hr over 240 Minutes Intravenous 3 times per day 11/15/13 1706         Assessment/Plan  1. Left axillary abscess with h/o  hidradenitis  Plan: 1. OR today for I&D   LOS: 1 day    OSBORNE,KELLY E 11/16/2013, 11:12 AM Pager: [phone]

## 2013-11-16 NOTE — Care Management Note (Signed)
    Page 1 of 1   11/16/2013     1:11:16 PM CARE MANAGEMENT NOTE 11/16/2013  Patient:  Victoria Rodriguez, Victoria Rodriguez   Account Number:  [id number]  Date Initiated:  11/16/2013  Documentation initiated by:  Dessa Phi  Subjective/Objective Assessment:   33 Y/O F ADMITTED W/L AXILLARY ABSCESS.     Action/Plan:   FROM HOME.   Anticipated DC Date:  11/17/2013   Anticipated DC Plan:  [place]  CM consult      Choice offered to / List presented to:             Status of service:  In process, will continue to follow Medicare Important Message given?   (If response is "NO", the following Medicare IM given date fields will be blank) Date Medicare IM given:   Date Additional Medicare IM given:    Discharge Disposition:    Per UR Regulation:  Reviewed for med. necessity/level of care/duration of stay  If discussed at Long Length of Stay Meetings, dates discussed:    Comments:  11/16/13 KATHY MAHABIR RN,BSN NCM [phone] SX TODAY-I&D L AXILLARY ABSCESS.NO ANTICIPATED D/C NEEDS.

## 2013-11-16 NOTE — Transfer of Care (Signed)
Immediate Anesthesia Transfer of Care Note  Patient: Victoria Rodriguez  Procedure(s) Performed: Procedure(s): IRRIGATION AND DEBRIDEMENT LEFT AXILLARY ABSCESS (Left)  Patient Location: PACU  Anesthesia Type:General  Level of Consciousness: awake, alert  and oriented  Airway & Oxygen Therapy: Patient Spontanous Breathing and Patient connected to face mask oxygen  Post-op Assessment: Report given to PACU RN  Post vital signs: Reviewed and stable  Complications: No apparent anesthesia complications

## 2013-11-16 NOTE — Interval H&P Note (Signed)
History and Physical Interval Note:  11/16/2013 1:40 PM  Victoria Rodriguez  has presented today for surgery, with the diagnosis of axillary abcess  The various methods of treatment have been discussed with the patient and family. After consideration of risks, benefits and other options for treatment, the patient has consented to  Procedure(s): IRRIGATION AND DEBRIDEMENT LEFT AXILLARY ABSCESS (Left) as a surgical intervention .  The patient's history has been reviewed, patient examined, no change in status, stable for surgery.  I have reviewed the patient's chart and labs.  Questions were answered to the patient's satisfaction.     MARTIN,MATTHEW B

## 2013-11-17 LAB — CBC
HCT: 37.9 % (ref 36.0–46.0)
Hemoglobin: 12.3 g/dL (ref 12.0–15.0)
MCH: 27.9 pg (ref 26.0–34.0)
MCHC: 32.5 g/dL (ref 30.0–36.0)
MCV: 85.9 fL (ref 78.0–100.0)
Platelets: 395 10*3/uL (ref 150–400)
RBC: 4.41 MIL/uL (ref 3.87–5.11)
RDW: 13.2 % (ref 11.5–15.5)
WBC: 6.7 10*3/uL (ref 4.0–10.5)

## 2013-11-17 LAB — BASIC METABOLIC PANEL
BUN: 5 mg/dL — ABNORMAL LOW (ref 6–23)
CO2: 23 mEq/L (ref 19–32)
CREATININE: 0.59 mg/dL (ref 0.50–1.10)
Calcium: 9.2 mg/dL (ref 8.4–10.5)
Chloride: 103 mEq/L (ref 96–112)
Glucose, Bld: 121 mg/dL — ABNORMAL HIGH (ref 70–99)
Potassium: 4.3 mEq/L (ref 3.7–5.3)
SODIUM: 137 meq/L (ref 137–147)

## 2013-11-17 NOTE — Progress Notes (Signed)
Patient ID: Victoria Rodriguez, female   DOB: 11-05-1980, 33 y.o.   MRN: [id number] [facility] Progress Note:   1 Day Post-Op  Subjective: Mental status is clear.  Home situation is living alone Objective: Vital signs in last 24 hours: Temp:  [97.6 F (36.4 C)-98.8 F (37.1 C)] 98 F (36.7 C) (06/20 9798) Pulse Rate:  [64-95] 67 (06/20 0614) Resp:  [10-18] 18 (06/20 0614) BP: (110-142)/(69-86) 116/78 mmHg (06/20 0614) SpO2:  [98 %-100 %] 100 % (06/20 0614)  Intake/Output from previous day: 06/19 0701 - 06/20 0700 In: 3588.3 [P.O.:220; I.V.:3268.3; IV Piggyback:100] Out: 1400 [Urine:1400] Intake/Output this shift:    Physical Exam: Work of breathing is normal.  Has 2 penrose drains draining in to gauze.    Lab Results:  Results for orders placed during the hospital encounter of 11/15/13 (from the past 48 hour(s))  BASIC METABOLIC PANEL     Status: None   Collection Time    11/15/13  6:46 PM      Result Value Ref Range   Sodium 139  137 - 147 mEq/L   Potassium 3.7  3.7 - 5.3 mEq/L   Chloride 104  96 - 112 mEq/L   CO2 24  19 - 32 mEq/L   Glucose, Bld 83  70 - 99 mg/dL   BUN 8  6 - 23 mg/dL   Creatinine, Ser 0.53  0.50 - 1.10 mg/dL   Calcium 8.9  8.4 - 10.5 mg/dL   GFR calc non Af Amer >90  >90 mL/min   GFR calc Af Amer >90  >90 mL/min   Comment: (NOTE)     The eGFR has been calculated using the CKD EPI equation.     This calculation has not been validated in all clinical situations.     eGFR's persistently <90 mL/min signify possible Chronic Kidney     Disease.  CBC     Status: Abnormal   Collection Time    11/15/13  6:46 PM      Result Value Ref Range   WBC 5.1  4.0 - 10.5 K/uL   RBC 4.28  3.87 - 5.11 MIL/uL   Hemoglobin 11.9 (*) 12.0 - 15.0 g/dL   HCT 36.6  36.0 - 46.0 %   MCV 85.5  78.0 - 100.0 fL   MCH 27.8  26.0 - 34.0 pg   MCHC 32.5  30.0 - 36.0 g/dL   RDW 13.9  11.5 - 15.5 %   Platelets 368  150 - 400 K/uL  SURGICAL PCR SCREEN     Status:  Abnormal   Collection Time    11/16/13  1:05 AM      Result Value Ref Range   MRSA, PCR NEGATIVE  NEGATIVE   Staphylococcus aureus POSITIVE (*) NEGATIVE   Comment:            The Xpert SA Assay (FDA     approved for NASAL specimens     in patients over 75 years of age),     is one component of     a comprehensive surveillance     program.  Test performance has     been validated by [employer] for patients greater     than or equal to 66 year old.     It is not intended     to diagnose infection nor to     guide or monitor treatment.     RESULT CALLED TO, READ  BACK BY AND VERIFIED WITH:     SPOKE WITH PEAN,P RN [phone] [place],N  CULTURE, ROUTINE-ABSCESS     Status: None   Collection Time    11/16/13  2:50 PM      Result Value Ref Range   Specimen Description ABSCESS AXILLA FLUID     Special Requests NONE     Gram Stain PENDING     Culture       Value: NO GROWTH 1 DAY     Performed at [facility]   Report Status PENDING    CBC     Status: None   Collection Time    11/16/13  4:43 PM      Result Value Ref Range   WBC 7.2  4.0 - 10.5 K/uL   RBC 4.38  3.87 - 5.11 MIL/uL   Hemoglobin 12.1  12.0 - 15.0 g/dL   HCT 37.1  36.0 - 46.0 %   MCV 84.7  78.0 - 100.0 fL   MCH 27.6  26.0 - 34.0 pg   MCHC 32.6  30.0 - 36.0 g/dL   RDW 13.4  11.5 - 15.5 %   Platelets 356  150 - 400 K/uL  CREATININE, SERUM     Status: None   Collection Time    11/16/13  4:43 PM      Result Value Ref Range   Creatinine, Ser 0.53  0.50 - 1.10 mg/dL   GFR calc non Af Amer >90  >90 mL/min   GFR calc Af Amer >90  >90 mL/min   Comment: (NOTE)     The eGFR has been calculated using the CKD EPI equation.     This calculation has not been validated in all clinical situations.     eGFR's persistently <90 mL/min signify possible Chronic Kidney     Disease.  CBC     Status: None   Collection Time    11/17/13  4:43 AM      Result Value Ref Range   WBC 6.7  4.0 - 10.5 K/uL   RBC 4.41   3.87 - 5.11 MIL/uL   Hemoglobin 12.3  12.0 - 15.0 g/dL   HCT 37.9  36.0 - 46.0 %   MCV 85.9  78.0 - 100.0 fL   MCH 27.9  26.0 - 34.0 pg   MCHC 32.5  30.0 - 36.0 g/dL   RDW 13.2  11.5 - 15.5 %   Platelets 395  150 - 400 K/uL  BASIC METABOLIC PANEL     Status: Abnormal   Collection Time    11/17/13  4:43 AM      Result Value Ref Range   Sodium 137  137 - 147 mEq/L   Potassium 4.3  3.7 - 5.3 mEq/L   Chloride 103  96 - 112 mEq/L   CO2 23  19 - 32 mEq/L   Glucose, Bld 121 (*) 70 - 99 mg/dL   BUN 5 (*) 6 - 23 mg/dL   Creatinine, Ser 0.59  0.50 - 1.10 mg/dL   Calcium 9.2  8.4 - 10.5 mg/dL   GFR calc non Af Amer >90  >90 mL/min   GFR calc Af Amer >90  >90 mL/min   Comment: (NOTE)     The eGFR has been calculated using the CKD EPI equation.     This calculation has not been validated in all clinical situations.     eGFR's persistently <90 mL/min signify possible Chronic Kidney     Disease.  Radiology/Results: No results found.  Anti-infectives: Anti-infectives   Start     Dose/Rate Route Frequency Ordered Stop   11/15/13 1800  piperacillin-tazobactam (ZOSYN) IVPB 3.375 g     3.375 g 12.5 mL/hr over 240 Minutes Intravenous 3 times per day 11/15/13 1706        Assessment/Plan: Problem List: Patient Active Problem List   Diagnosis Date Noted  . Abscess of axilla 11/16/2013  . Abscess of axillary region 11/15/2013  . Hidradenitis suppurativa 04/18/2012  . Obesity 02/29/2012  . Keratosis punctata 02/29/2012    Will begin showers and daily dressing changes. - 1 Day Post-Op    LOS: 2 days   Matt B. Hassell Done, MD, [facility] [phone] beeper [phone]  11/17/2013 8:10 AM

## 2013-11-18 LAB — CBC
HCT: 35.6 % — ABNORMAL LOW (ref 36.0–46.0)
Hemoglobin: 11.5 g/dL — ABNORMAL LOW (ref 12.0–15.0)
MCH: 27.6 pg (ref 26.0–34.0)
MCHC: 32.3 g/dL (ref 30.0–36.0)
MCV: 85.4 fL (ref 78.0–100.0)
Platelets: 370 10*3/uL (ref 150–400)
RBC: 4.17 MIL/uL (ref 3.87–5.11)
RDW: 13.6 % (ref 11.5–15.5)
WBC: 6.9 10*3/uL (ref 4.0–10.5)

## 2013-11-18 LAB — BASIC METABOLIC PANEL
BUN: 7 mg/dL (ref 6–23)
CO2: 25 mEq/L (ref 19–32)
Calcium: 8.6 mg/dL (ref 8.4–10.5)
Chloride: 101 mEq/L (ref 96–112)
Creatinine, Ser: 0.67 mg/dL (ref 0.50–1.10)
GFR calc non Af Amer: >90 mL/min (ref >90)
Glucose, Bld: 96 mg/dL (ref 70–99)
Potassium: 3.8 mEq/L (ref 3.7–5.3)
SODIUM: 137 meq/L (ref 137–147)

## 2013-11-18 NOTE — Progress Notes (Signed)
Patient ID: Victoria Rodriguez, female   DOB: [birth year], 33 y.o.   MRN: [id number] [facility] Progress Note:   2 Days Post-Op  Subjective: Mental status is clear Objective: Vital signs in last 24 hours: Temp:  [97.8 F (36.6 C)-98.3 F (36.8 C)] 97.8 F (36.6 C) (06/21 0619) Pulse Rate:  [62-68] 68 (06/21 0619) Resp:  [16-18] 18 (06/21 0619) BP: (109-126)/(70-82) 126/82 mmHg (06/21 0619) SpO2:  [99 %-100 %] 99 % (06/21 0619)  Intake/Output from previous day: 06/20 0701 - 06/21 0700 In: 2032.5 [P.O.:480; I.V.:1402.5; IV Piggyback:150] Out: 2300 [Urine:2300] Intake/Output this shift:    Physical Exam: Work of breathing is normal.  Axilla is dressed.  Penrose drains in place and can come out tomorrow.    Lab Results:  Results for orders placed during the hospital encounter of 11/15/13 (from the past 48 hour(s))  CULTURE, ROUTINE-ABSCESS     Status: None   Collection Time    11/16/13  2:50 PM      Result Value Ref Range   Specimen Description ABSCESS AXILLA FLUID     Special Requests NONE     Gram Stain       Value: NO WBC SEEN     NO ORGANISMS SEEN     Performed at [facility]   Culture       Value: NO GROWTH 2 DAYS     Performed at [facility]   Report Status PENDING    ANAEROBIC CULTURE     Status: None   Collection Time    11/16/13  2:50 PM      Result Value Ref Range   Specimen Description ABSCESS AXILLA FLUID     Special Requests NONE     Gram Stain       Value: NO WBC SEEN     NO ORGANISMS SEEN     Performed at [facility]   Culture       Value: NO ANAEROBES ISOLATED; CULTURE IN PROGRESS FOR 5 DAYS     Performed at [facility]   Report Status PENDING    CBC     Status: None   Collection Time    11/16/13  4:43 PM      Result Value Ref Range   WBC 7.2  4.0 - 10.5 K/uL   RBC 4.38  3.87 - 5.11 MIL/uL   Hemoglobin 12.1  12.0 - 15.0 g/dL   HCT 37.1  36.0 - 46.0 %   MCV 84.7  78.0 - 100.0 fL   MCH 27.6  26.0 -  34.0 pg   MCHC 32.6  30.0 - 36.0 g/dL   RDW 13.4  11.5 - 15.5 %   Platelets 356  150 - 400 K/uL  CREATININE, SERUM     Status: None   Collection Time    11/16/13  4:43 PM      Result Value Ref Range   Creatinine, Ser 0.53  0.50 - 1.10 mg/dL   GFR calc non Af Amer >90  >90 mL/min   GFR calc Af Amer >90  >90 mL/min   Comment: (NOTE)     The eGFR has been calculated using the CKD EPI equation.     This calculation has not been validated in all clinical situations.     eGFR's persistently <90 mL/min signify possible Chronic Kidney     Disease.  CBC     Status: None   Collection Time    11/17/13  4:43 AM      Result Value Ref Range   WBC 6.7  4.0 - 10.5 K/uL   RBC 4.41  3.87 - 5.11 MIL/uL   Hemoglobin 12.3  12.0 - 15.0 g/dL   HCT 37.9  36.0 - 46.0 %   MCV 85.9  78.0 - 100.0 fL   MCH 27.9  26.0 - 34.0 pg   MCHC 32.5  30.0 - 36.0 g/dL   RDW 13.2  11.5 - 15.5 %   Platelets 395  150 - 400 K/uL  BASIC METABOLIC PANEL     Status: Abnormal   Collection Time    11/17/13  4:43 AM      Result Value Ref Range   Sodium 137  137 - 147 mEq/L   Potassium 4.3  3.7 - 5.3 mEq/L   Chloride 103  96 - 112 mEq/L   CO2 23  19 - 32 mEq/L   Glucose, Bld 121 (*) 70 - 99 mg/dL   BUN 5 (*) 6 - 23 mg/dL   Creatinine, Ser 0.59  0.50 - 1.10 mg/dL   Calcium 9.2  8.4 - 10.5 mg/dL   GFR calc non Af Amer >90  >90 mL/min   GFR calc Af Amer >90  >90 mL/min   Comment: (NOTE)     The eGFR has been calculated using the CKD EPI equation.     This calculation has not been validated in all clinical situations.     eGFR's persistently <90 mL/min signify possible Chronic Kidney     Disease.  CBC     Status: Abnormal   Collection Time    11/18/13  5:39 AM      Result Value Ref Range   WBC 6.9  4.0 - 10.5 K/uL   RBC 4.17  3.87 - 5.11 MIL/uL   Hemoglobin 11.5 (*) 12.0 - 15.0 g/dL   HCT 35.6 (*) 36.0 - 46.0 %   MCV 85.4  78.0 - 100.0 fL   MCH 27.6  26.0 - 34.0 pg   MCHC 32.3  30.0 - 36.0 g/dL   RDW 13.6  11.5 -  15.5 %   Platelets 370  150 - 400 K/uL  BASIC METABOLIC PANEL     Status: None   Collection Time    11/18/13  5:39 AM      Result Value Ref Range   Sodium 137  137 - 147 mEq/L   Potassium 3.8  3.7 - 5.3 mEq/L   Chloride 101  96 - 112 mEq/L   CO2 25  19 - 32 mEq/L   Glucose, Bld 96  70 - 99 mg/dL   BUN 7  6 - 23 mg/dL   Creatinine, Ser 0.67  0.50 - 1.10 mg/dL   Calcium 8.6  8.4 - 10.5 mg/dL   GFR calc non Af Amer >90  >90 mL/min   GFR calc Af Amer >90  >90 mL/min   Comment: (NOTE)     The eGFR has been calculated using the CKD EPI equation.     This calculation has not been validated in all clinical situations.     eGFR's persistently <90 mL/min signify possible Chronic Kidney     Disease.    Radiology/Results: No results found.  Anti-infectives: Anti-infectives   Start     Dose/Rate Route Frequency Ordered Stop   11/15/13 1800  piperacillin-tazobactam (ZOSYN) IVPB 3.375 g     3.375 g 12.5 mL/hr over 240 Minutes Intravenous 3 times per day 11/15/13 1706  Assessment/Plan: Problem List: Patient Active Problem List   Diagnosis Date Noted  . Abscess of axilla 11/16/2013  . Abscess of axillary region 11/15/2013  . Hidradenitis suppurativa 04/18/2012  . Obesity 02/29/2012  . Keratosis punctata 02/29/2012    Remove penrose drains tomorrow and probable discharge.   2 Days Post-Op    LOS: 3 days   Matt B. Hassell Done, MD, [facility] [phone] beeper [phone]  11/18/2013 9:27 AM

## 2013-11-19 ENCOUNTER — Encounter (HOSPITAL_COMMUNITY): Payer: Self-pay | Admitting: Surgery

## 2013-11-19 MED ORDER — OXYCODONE-ACETAMINOPHEN 5-325 MG PO TABS: 1.0000 | ORAL_TABLET | ORAL | Status: DC | PRN

## 2013-11-19 MED ORDER — DOXYCYCLINE HYCLATE 100 MG PO TABS: 100.0000 mg | ORAL_TABLET | Freq: Two times a day (BID) | ORAL | Status: DC

## 2013-11-19 MED ORDER — ONDANSETRON HCL 4 MG PO TABS: 4.0000 mg | ORAL_TABLET | Freq: Three times a day (TID) | ORAL | Status: DC | PRN

## 2013-11-19 NOTE — Discharge Summary (Signed)
Patient ID: Victoria Rodriguez MRN: [id number] DOB/AGE: 1980-06-07 33 y.o.  Admit date: 11/15/2013 Discharge date: 11/19/2013  Procedures: incision and drainage of left axillary abscess on 11-16-13 by Dr. Johnathan Hausen  Consults: None  Reason for Admission: 33 yo female with long history of bilateral axillary hidradenitis presents with about 10 days of worsening pain and swelling in left axilla. She has been treated with PO abx unsuccessfully and was referred to our office today for evaluation. She has too much tenderness to even raise her arm for examination. Some fluctuance is noted on palpation, but I cannot visualize the extent of the infection because her limited ROM from tenderness.  We will admit to the hospital for drainage under anesthesia.  Admission Diagnoses:  1. hidradenitis suppurativa 2. Left axillary abscess  Hospital Course: The patient was admitted and started on IV abx therapy.  She was taken to the OR where she underwent an I&D of a left axillary abscess.  She had penrose drains placed in these wounds.  She was continued on IV abx therapy for a couple more days.  On POD 3, she was stable for dc home.  Her penrose drains were removed on day of discharge.  PE: Skin: left axillary wound is soft with minimal drainage, still tender, penrose drains removed with no issues.  Discharge Diagnoses:  Active Problems:   Abscess of axillary region   Abscess of axilla s/p I&D of left axillary abscess  Discharge Medications:   Medication List    STOP taking these medications       traMADol 50 MG tablet  Commonly known as:  ULTRAM      TAKE these medications       doxycycline 100 MG tablet  Commonly known as:  VIBRA-TABS  Take 1 tablet (100 mg total) by mouth 2 (two) times daily.     etonogestrel 68 MG Impl implant  Commonly known as:  IMPLANON  Inject 1 each into the skin once. [facility]     ibuprofen 800 MG tablet  Commonly known  as:  ADVIL,MOTRIN  Take 1 tablet (800 mg total) by mouth every 8 (eight) hours as needed for fever, headache or moderate pain.     ondansetron 4 MG tablet  Commonly known as:  ZOFRAN  Take 1 tablet (4 mg total) by mouth every 8 (eight) hours as needed for nausea or vomiting.     ondansetron 8 MG disintegrating tablet  Commonly known as:  ZOFRAN ODT  Take 1 tablet (8 mg total) by mouth every 8 (eight) hours as needed for nausea or vomiting.     oxyCODONE-acetaminophen 5-325 MG per tablet  Commonly known as:  ROXICET  Take 1-2 tablets by mouth every 4 (four) hours as needed for severe pain.     sulfamethoxazole-trimethoprim 800-160 MG per tablet  Commonly known as:  BACTRIM DS  Take 1 tablet by mouth 2 (two) times daily.        Discharge Instructions:     Follow-up Information   Follow up with Johnathan Hausen B, MD. Schedule an appointment as soon as possible for a visit in 2 weeks.   Specialty:  General Surgery   Contact information:   [address] [place] [address] [phone]       Signed: Henreitta Cea 11/19/2013, 8:21 AM

## 2013-11-19 NOTE — Discharge Instructions (Signed)
Place dry dressing over wound daily. Get in the shower and wash this area twice a day Call our office for worsening pain, fever greater than 101, or worsening infection

## 2013-11-19 NOTE — Discharge Summary (Signed)
I have personally examined and interviewed this patient this morning.I agree with the discharge summary and outpatient plans as outlined by [name], [place].  The tissues are soft. Drains are removed.  She will be discharged on antibiotics and wound care. She will followup with Dr. Hassell Done in the office.  Victoria Rodriguez. Dalbert Batman, M.D., [facility] General and Minimally invasive Surgery Breast and Colorectal Surgery Office:   [phone]

## 2013-11-20 ENCOUNTER — Telehealth: Payer: Self-pay | Admitting: Family Medicine

## 2013-11-20 LAB — CULTURE, ROUTINE-ABSCESS
CULTURE: NO GROWTH
GRAM STAIN: NONE SEEN

## 2013-11-20 NOTE — Telephone Encounter (Signed)
Reviewed patient's discharge summary. Called to check in. She is doing well. Still with some pain, much improved since I&D. Had a question about which antibiotic to take, ? Just the doxycycline. I see that a new Rx for doxy was sent in. I advised her to take the doxy for the 14 days prescribed and keep gen surg f/u.  Patient agreed with plan, voiced understanding. Aware that she can call her to [facility] if she has questions or concerns.

## 2013-11-21 LAB — ANAEROBIC CULTURE: Gram Stain: NONE SEEN

## 2013-12-03 ENCOUNTER — Telehealth: Payer: Self-pay | Admitting: Family Medicine

## 2013-12-03 NOTE — Telephone Encounter (Signed)
I spoke with Victoria Rodriguez on phone. She reports about a 5 out of 10 pain in axilla where she had I & D surgery s/p penrose drain on 11/15/13. She denies significant drainage from axilla. Mild increased warmth in axilla.  No Fever/chills. Pt believes I&D surgical site has just about healed closed.  Ibuprofen is not helping with the axillary pain.  She is waiting on [place] to call her for a work-in appointment to be seen today or tomorrow.    A/ Acute & subacute pain at surgical site    P/ Recommend patient be evaluated by providers at [facility] to look for re-infection, hematoma, or other surgical site complication that could explain patient's pain      Recommend that patient address her "return to work letter" from the providers at [facility] after they evaluate her wound site.   Pt to notify our office if patient does not receive call by end of day from [facility] with [place] appointment for evaluation.

## 2013-12-03 NOTE — Telephone Encounter (Signed)
Patient had surgery on arm on 6/18. Not able to get in with [facility] due to being booked up. Still having a lot of pain at surgery site. Requesting meds to help with pain. Also, needs a release back to work note. Was supposed to return today but employer will not let her without letter.

## 2013-12-03 NOTE — Telephone Encounter (Signed)
I informed Victoria Rodriguez of an appointment at [facility] tomorrow (12/04/13) at 2:30 pm.  She said she would be able to attend that consultation appointment.

## 2013-12-04 ENCOUNTER — Ambulatory Visit (INDEPENDENT_AMBULATORY_CARE_PROVIDER_SITE_OTHER): Payer: Medicaid Other | Admitting: Surgery

## 2013-12-04 ENCOUNTER — Encounter (INDEPENDENT_AMBULATORY_CARE_PROVIDER_SITE_OTHER): Payer: Self-pay | Admitting: Surgery

## 2013-12-04 VITALS — BP 126/70 | HR 80 | Temp 98.3°F | Ht 63.0 in | Wt 193.0 lb

## 2013-12-04 DIAGNOSIS — L02419 Cutaneous abscess of limb, unspecified: Secondary | ICD-10-CM

## 2013-12-04 DIAGNOSIS — L732 Hidradenitis suppurativa: Secondary | ICD-10-CM

## 2013-12-04 DIAGNOSIS — IMO0002 Reserved for concepts with insufficient information to code with codable children: Secondary | ICD-10-CM

## 2013-12-04 NOTE — Progress Notes (Signed)
General Surgery [facility]  Chief Complaint  Patient presents with  . Routine Post Op    left axillary hidradenitis - Dr. Kaylyn Lim    HISTORY: Patient is a 33 year old female known to our surgical practice. She underwent incision and drainage of a left axillary abscess at the hospital 2 and half weeks ago. She had a Penrose drain in place for several days postoperatively. That was removed before discharge home from the hospital. She returns today for wound check.  EXAM: Left axilla shows healed surgical wounds. No drainage. No sign of cellulitis. No fluctuance. Mild tenderness. Right axilla shows no acute findings.  IMPRESSION: Status post incision and drainage of left axillary abscess, history of hidradenitis  PLAN: Usual care instructions were provided to the patient. She is given a note to return to work to full duty.  Patient will return for surgical care as needed.  Earnstine Regal, MD, [facility]   Visit Diagnoses: 1. Hidradenitis suppurativa   2. Abscess of axilla

## 2013-12-09 ENCOUNTER — Observation Stay (HOSPITAL_COMMUNITY)
Admission: EM | Admit: 2013-12-09 | Discharge: 2013-12-10 | Disposition: A | Discharge status: Home / Self Care | Payer: Medicaid Other | Attending: Surgery | Admitting: Surgery

## 2013-12-09 ENCOUNTER — Encounter (HOSPITAL_COMMUNITY): Payer: Self-pay | Admitting: Emergency Medicine

## 2013-12-09 ENCOUNTER — Emergency Department (HOSPITAL_COMMUNITY): Payer: Medicaid Other

## 2013-12-09 DIAGNOSIS — IMO0002 Reserved for concepts with insufficient information to code with codable children: Secondary | ICD-10-CM | POA: Diagnosis not present

## 2013-12-09 DIAGNOSIS — L732 Hidradenitis suppurativa: Secondary | ICD-10-CM | POA: Diagnosis not present

## 2013-12-09 DIAGNOSIS — Z9889 Other specified postprocedural states: Secondary | ICD-10-CM | POA: Insufficient documentation

## 2013-12-09 DIAGNOSIS — L02419 Cutaneous abscess of limb, unspecified: Secondary | ICD-10-CM | POA: Diagnosis present

## 2013-12-09 LAB — CBC
HEMATOCRIT: 34.9 % — AB (ref 36.0–46.0)
HEMOGLOBIN: 11.1 g/dL — AB (ref 12.0–15.0)
MCH: 27.5 pg (ref 26.0–34.0)
MCHC: 31.8 g/dL (ref 30.0–36.0)
MCV: 86.6 fL (ref 78.0–100.0)
Platelets: 273 10*3/uL (ref 150–400)
RBC: 4.03 MIL/uL (ref 3.87–5.11)
RDW: 14 % (ref 11.5–15.5)
WBC: 7.1 10*3/uL (ref 4.0–10.5)

## 2013-12-09 LAB — BASIC METABOLIC PANEL
Anion gap: 15 (ref 5–15)
BUN: 8 mg/dL (ref 6–23)
CALCIUM: 8.7 mg/dL (ref 8.4–10.5)
CO2: 22 mEq/L (ref 19–32)
Chloride: 100 mEq/L (ref 96–112)
Creatinine, Ser: 0.56 mg/dL (ref 0.50–1.10)
GFR calc Af Amer: >90 mL/min (ref >90)
GLUCOSE: 94 mg/dL (ref 70–99)
Potassium: 3.6 mEq/L — ABNORMAL LOW (ref 3.7–5.3)
Sodium: 137 mEq/L (ref 137–147)

## 2013-12-09 LAB — POC URINE PREG, ED: Preg Test, Ur: NEGATIVE

## 2013-12-09 MED ORDER — CLINDAMYCIN PHOSPHATE 600 MG/50ML IV SOLN
600.0000 mg | Freq: Four times a day (QID) | INTRAVENOUS | Status: DC
  Administered 2013-12-09 – 2013-12-10 (×4): 600 mg via INTRAVENOUS
  Filled 2013-12-09 (×6): qty 50

## 2013-12-09 MED ORDER — ENOXAPARIN SODIUM 40 MG/0.4ML ~~LOC~~ SOLN
40.0000 mg | SUBCUTANEOUS | Status: DC
  Administered 2013-12-09: 40 mg via SUBCUTANEOUS
  Filled 2013-12-09 (×2): qty 0.4

## 2013-12-09 MED ORDER — ONDANSETRON HCL 4 MG/2ML IJ SOLN
4.0000 mg | Freq: Once | INTRAMUSCULAR | Status: DC
  Filled 2013-12-09: qty 2

## 2013-12-09 MED ORDER — ONDANSETRON HCL 4 MG/2ML IJ SOLN
4.0000 mg | Freq: Four times a day (QID) | INTRAMUSCULAR | Status: DC | PRN
  Administered 2013-12-09: 4 mg via INTRAVENOUS
  Filled 2013-12-09: qty 2

## 2013-12-09 MED ORDER — IBUPROFEN 400 MG PO TABS
600.0000 mg | ORAL_TABLET | Freq: Once | ORAL | Status: AC
  Administered 2013-12-09: 600 mg via ORAL
  Filled 2013-12-09 (×2): qty 1

## 2013-12-09 MED ORDER — CLINDAMYCIN PHOSPHATE 600 MG/50ML IV SOLN
600.0000 mg | Freq: Once | INTRAVENOUS | Status: AC
  Administered 2013-12-09: 600 mg via INTRAVENOUS
  Filled 2013-12-09: qty 50

## 2013-12-09 MED ORDER — OXYCODONE HCL 5 MG PO TABS: 5.0000 mg | ORAL_TABLET | ORAL | Status: DC | PRN

## 2013-12-09 MED ORDER — HYDROMORPHONE HCL PF 1 MG/ML IJ SOLN
1.0000 mg | Freq: Once | INTRAMUSCULAR | Status: AC
  Administered 2013-12-09: 1 mg via INTRAVENOUS
  Filled 2013-12-09: qty 1

## 2013-12-09 MED ORDER — HYDROMORPHONE HCL PF 1 MG/ML IJ SOLN
1.0000 mg | INTRAMUSCULAR | Status: DC | PRN
  Administered 2013-12-09: 1 mg via INTRAVENOUS
  Filled 2013-12-09: qty 1

## 2013-12-09 NOTE — ED Notes (Signed)
Patient returned to room. 

## 2013-12-09 NOTE — ED Notes (Signed)
Emina Riebock, PA-C at bedside performing I&D on left axilla abscess.

## 2013-12-09 NOTE — ED Notes (Signed)
Carlynn Purl at bedside, to consult to general surgery

## 2013-12-09 NOTE — H&P (Signed)
Chief Complaint: left axillary abscess HPI: Victoria Rodriguez is a 33 year old female with a history of hidradenitis suppurativa s/p excision by Dr. Rise Patience, then followed by Dr.  Lilyan Punt.  She had an I&D under anesthesia of the left axillary abscess by Dr. Hassell Done on 11/16/13.  She was admitted, treated with IV antibiotics and subsequently sent home.  She followed up with Dr. Harlow Asa last week at which time she reported some pain.  Thereafter, she developed increased swelling. Denies drainage, fever, or chills. Rates her pain 7/10.  No modifying factors.  No aggravating or alleviating factors.  Denies systemic symptoms.  She is otherwise healthy.    Past Medical History  Diagnosis Date  . Hidradenitis suppurativa     surgical    Past Surgical History  Procedure Laterality Date  . Axillary hidradenitis excision  2011    bilateral  . Irrigation and debridement abscess Left 11/16/2013    Procedure: IRRIGATION AND DEBRIDEMENT LEFT AXILLARY ABSCESS;  Surgeon: Pedro Earls, MD;  Location: [facility];  Service: General;  Laterality: Left;    Family History  Problem Relation Age of Onset  . Asthma Mother   . Diabetes Father   . Asthma Sister   . Asthma Brother   . Cancer Neg Hx   . Heart disease Neg Hx   . Stroke Neg Hx    Social History:  reports that she has never smoked. She has never used smokeless tobacco. She reports that she drinks alcohol. She reports that she does not use illicit drugs.  Allergies:  Allergies  Allergen Reactions  . Vicodin [Hydrocodone-Acetaminophen] Nausea Only   Medication Implanon  (Not in a hospital admission)  Results for orders placed during the hospital encounter of 12/09/13 (from the past 48 hour(s))  CBC     Status: Abnormal   Collection Time    12/09/13  6:46 AM      Result Value Ref Range   WBC 7.1  4.0 - 10.5 K/uL   RBC 4.03  3.87 - 5.11 MIL/uL   Hemoglobin 11.1 (*) 12.0 - 15.0 g/dL   HCT 34.9 (*) 36.0 - 46.0 %   MCV 86.6  78.0 - 100.0 fL    MCH 27.5  26.0 - 34.0 pg   MCHC 31.8  30.0 - 36.0 g/dL   RDW 14.0  11.5 - 15.5 %   Platelets 273  150 - 400 K/uL  BASIC METABOLIC PANEL     Status: Abnormal   Collection Time    12/09/13  6:46 AM      Result Value Ref Range   Sodium 137  137 - 147 mEq/L   Potassium 3.6 (*) 3.7 - 5.3 mEq/L   Chloride 100  96 - 112 mEq/L   CO2 22  19 - 32 mEq/L   Glucose, Bld 94  70 - 99 mg/dL   BUN 8  6 - 23 mg/dL   Creatinine, Ser 0.56  0.50 - 1.10 mg/dL   Calcium 8.7  8.4 - 10.5 mg/dL   GFR calc non Af Amer >90  >90 mL/min   GFR calc Af Amer >90  >90 mL/min   Comment: (NOTE)     The eGFR has been calculated using the CKD EPI equation.     This calculation has not been validated in all clinical situations.     eGFR's persistently <90 mL/min signify possible Chronic Kidney     Disease.   Anion gap 15  5 - 15  POC URINE  PREG, ED     Status: None   Collection Time    12/09/13  7:10 AM      Result Value Ref Range   Preg Test, Ur NEGATIVE  NEGATIVE   Comment:            THE SENSITIVITY OF THIS     METHODOLOGY IS >24 mIU/mL   [place] Chest  12/09/2013   CLINICAL DATA:  Question left axillary abscess. Abscess drainage on 11/16/2013 from the axilla.  EXAM: CHEST ULTRASOUND  COMPARISON:  None.  FINDINGS: There is a thick walled mass was central anechoic fluid within the left axilla measuring 4.1 x 3.5 x 2.1 cm. The periphery of this lesion is hyperemic.  IMPRESSION: Findings consistent with left axillary abscess versus large suppurative lymph node.   Electronically Signed   By: Suzy Bouchard M.D.   On: 12/09/2013 08:22    Review of Systems  All other systems reviewed and are negative.   Blood pressure 132/86, pulse 83, temperature 97.6 F (36.4 C), temperature source Oral, resp. rate 18, height 5' 3"  (1.6 m), weight 194 lb (87.998 kg), SpO2 100.00%. Physical Exam  Constitutional: She is oriented to person, place, and time. She appears well-developed and well-nourished. No distress.  HENT:  Head:  Normocephalic and atraumatic.  Neck: Normal range of motion. Neck supple.  Cardiovascular: Normal rate, regular rhythm, normal heart sounds and intact distal pulses.  Exam reveals no gallop and no friction rub.   No murmur heard. Respiratory: Effort normal and breath sounds normal. No respiratory distress. She has no wheezes. She has no rales. She exhibits no tenderness.  GI: Soft. Bowel sounds are normal. She exhibits no distension and no mass. There is no tenderness. There is no rebound and no guarding.  Musculoskeletal: Normal range of motion. She exhibits no edema and no tenderness.  Neurological: She is alert and oriented to person, place, and time.  Skin: She is not diaphoretic.  2x3x3cm left axillary abscess, fluctuant and tender, purulent drainage upon I&D  Psychiatric: She has a normal mood and affect. Her behavior is normal. Judgment normal.     Assessment/Plan Left axillary abscess Hidradenitis suppurativa -bedside I&D, refer to procedure note for full details -admit for IV antibiotics -pain control -regular diet -SCD/lovenox -will plan dressing change tomorrow(packed with 1/4 inch iodoform)  Yosselyn Tax ANP-BC 12/09/2013, 10:59 AM

## 2013-12-09 NOTE — ED Notes (Signed)
The pt has surgery for a boil under her lt arm on the 17th.  She was seen Tuesday and told by the surgeon that she could go back to work.  She went back to work today  But she has had much pain since she left work and the area is swollen

## 2013-12-09 NOTE — H&P (Signed)
Seen together in ED. Patient tolerated I&D well. Admit for IV ABX and wound care. Patient examined and I agree with the assessment and plan  Georganna Skeans, MD, MPH, FACS Trauma: [phone] General Surgery: [phone]  12/09/2013 1:30 PM

## 2013-12-09 NOTE — ED Provider Notes (Signed)
CSN: [id number]     Arrival date & time 12/09/13  0132 History   First MD Initiated Contact with Patient 12/09/13 [phone]     Chief Complaint  Patient presents with  . Wound Check     (Consider location/radiation/quality/duration/timing/severity/associated sxs/prior Treatment) The history is provided by the patient. No language interpreter was used.  JENICKA COXE is a 33 y/o F with PMHx of hydradenitis suppurativa presenting to the ED with left axilla swelling and pain that has gone progressively worse since Tuesday. As per patient, reported that she had surgery performed back in June of 2015 where I&D was performed for coronary hydradenitis to 13. Patient reported that she was seen and assessed by the surgeon on Tuesday for medically cleared the patient. Patient reported that during this time, lost seen the physician she noticed that there is swelling in her left axilla-brought this to the tension surgeon who reported that he was okay. Patient reported that the swelling has gone progressively worse and appears in intense pain in her axilla that she is unable to described. Reported that she feels as if her whole left shoulder in small in full. 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 Stated that she feels a mild weakness in her left arm secondary to pain. Stated that she's been using Motrin with minimally. Denied radiation of pain. Stated that she's finished her course of doxycycline. Denied numbness, chest pain, shortness of breath, difficulty breathing, draining, bleeding, nausea, vomiting, neck pain, neck stiffness, fever, chills, difficulty swallowing. PCP Dr. McDiarmid  Past Medical History  Diagnosis Date  . Hidradenitis suppurativa     surgical   Past Surgical History  Procedure Laterality Date  . Axillary hidradenitis excision  2011    bilateral  . Irrigation and debridement abscess Left 11/16/2013    Procedure:  IRRIGATION AND DEBRIDEMENT LEFT AXILLARY ABSCESS;  Surgeon: Pedro Earls, MD;  Location: [facility];  Service: General;  Laterality: Left;   Family History  Problem Relation Age of Onset  . Asthma Mother   . Diabetes Father   . Asthma Sister   . Asthma Brother   . Cancer Neg Hx   . Heart disease Neg Hx   . Stroke Neg Hx    History  Substance Use Topics  . Smoking status: Never Smoker   . Smokeless tobacco: Never Used  . Alcohol Use: Yes     Comment: drinks monthly or less.   OB History   Grav Para Term Preterm Abortions TAB SAB Ect Mult Living   3 1 1  0 2 2    1      Review of Systems  Constitutional: Negative for fever and chills.  Respiratory: Negative for chest tightness and shortness of breath.   Cardiovascular: Negative for chest pain.  Musculoskeletal: Positive for arthralgias (left shoulder/axilla pain ) and joint swelling. Negative for neck pain.  Neurological: Negative for weakness and numbness.      Allergies  Vicodin  Home Medications   Prior to Admission medications   Medication Sig Start Date End Date Taking? Authorizing Provider  etonogestrel (IMPLANON) 68 MG IMPL implant Inject 1 each into the skin once. [facility]   Yes Historical Provider, MD   BP 132/86  Pulse 83  Temp(Src) 97.6 F (36.4 C) (Oral)  Resp 18  Ht 5\' 3"  (1.6 m)  Wt 194 lb (87.998 kg)  BMI 34.37 kg/m2  SpO2 100%  Physical Exam  Nursing note and vitals reviewed. Constitutional: She is oriented to person, place, and time. She appears well-developed and well-nourished. No distress.  Patient found laying in bed comfortably in no sign of acute distress  HENT:  Head: Normocephalic and atraumatic.  Mouth/Throat: Oropharynx is clear and moist. No oropharyngeal exudate.  Eyes: Conjunctivae and EOM are normal. Pupils are equal, round, and reactive to light. Right eye exhibits no discharge. Left eye exhibits no discharge.  Neck: Normal range of motion. Neck  supple. No tracheal deviation present.  Cardiovascular: Normal rate, regular rhythm and normal heart sounds.  Exam reveals no friction rub.   No murmur heard. Pulses:      Radial pulses are 2+ on the right side, and 2+ on the left side.  Pulmonary/Chest: Effort normal and breath sounds normal. No respiratory distress. She has no wheezes. She has no rales.  Patient is able to speak in full sentences without difficulty  Negative use of accessory muscles Negative strdior  Musculoskeletal: She exhibits edema and tenderness.       Left shoulder: She exhibits decreased range of motion, tenderness and swelling. She exhibits no effusion, no crepitus and no deformity.  Swelling noted to the left axilla with negative changes to skin color, drainage, active bleeding, weeping. Mild warmth upon palpation. Scar tissue and healed over incision site noted to the left axilla. Negative red streaks. Decreased ROM to the left shoulder secondary to pain - able to flex - minimal extension noted secondary to pain. Patient unable to full abduct - half way abduction noted. Full ROM to the left elbow and left hand/digits without difficulty.   Lymphadenopathy:    She has no cervical adenopathy.  Neurological: She is alert and oriented to person, place, and time. No cranial nerve deficit. She exhibits normal muscle tone. Coordination normal.  Cranial nerves III-XII grossly intact Strength 5+/5+ to upper extremities bilaterally with resistance applied, equal distribution noted Strength intact to MCP, PIP, DIP joints of left hand Equal grip strength  Sensation intact   Skin: Skin is warm and dry. No rash noted. She is not diaphoretic. No erythema.  Psychiatric: She has a normal mood and affect. Her behavior is normal. Thought content normal.    ED Course  Procedures (including critical care time)  7:54 AM This provider discussed case with attending physician, Dr. Roxine Caddy who recommended to start patient on IV  clindamycin for cellulitis control and agreed to consult with surgery.   9:20 AM This provider spoke with Dr. Grandville Silos, CCS - discussed case, imaging, labs in great detail. Surgery to come and assess.   Results for orders placed during the hospital encounter of 12/09/13  CBC      Result Value Ref Range   WBC 7.1  4.0 - 10.5 K/uL   RBC 4.03  3.87 - 5.11 MIL/uL   Hemoglobin 11.1 (*) 12.0 - 15.0 g/dL   HCT 34.9 (*) 36.0 - 46.0 %   MCV 86.6  78.0 - 100.0 fL   MCH 27.5  26.0 - 34.0 pg   MCHC 31.8  30.0 - 36.0 g/dL   RDW 14.0  11.5 - 15.5 %   Platelets 273  150 - 400 K/uL  BASIC METABOLIC PANEL      Result Value Ref Range   Sodium 137  137 - 147 mEq/L   Potassium 3.6 (*) 3.7 - 5.3 mEq/L   Chloride 100  96 - 112 mEq/L   CO2 22  19 - 32  mEq/L   Glucose, Bld 94  70 - 99 mg/dL   BUN 8  6 - 23 mg/dL   Creatinine, Ser 0.56  0.50 - 1.10 mg/dL   Calcium 8.7  8.4 - 10.5 mg/dL   GFR calc non Af Amer >90  >90 mL/min   GFR calc Af Amer >90  >90 mL/min   Anion gap 15  5 - 15  POC URINE PREG, ED      Result Value Ref Range   Preg Test, Ur NEGATIVE  NEGATIVE    Labs Review Labs Reviewed  CBC - Abnormal; Notable for the following:    Hemoglobin 11.1 (*)    HCT 34.9 (*)    All other components within normal limits  BASIC METABOLIC PANEL - Abnormal; Notable for the following:    Potassium 3.6 (*)    All other components within normal limits  WOUND CULTURE  POC URINE PREG, ED    Imaging Review [place] Chest  12/09/2013   CLINICAL DATA:  Question left axillary abscess. Abscess drainage on 11/16/2013 from the axilla.  EXAM: CHEST ULTRASOUND  COMPARISON:  None.  FINDINGS: There is a thick walled mass was central anechoic fluid within the left axilla measuring 4.1 x 3.5 x 2.1 cm. The periphery of this lesion is hyperemic.  IMPRESSION: Findings consistent with left axillary abscess versus large suppurative lymph node.   Electronically Signed   By: Suzy Bouchard M.D.   On: 12/09/2013 08:22     EKG  Interpretation None      MDM   Final diagnoses:  Axillary abscess    Medications  enoxaparin (LOVENOX) injection 40 mg (not administered)  clindamycin (CLEOCIN) IVPB 600 mg (not administered)  HYDROmorphone (DILAUDID) injection 1 mg (not administered)  oxyCODONE (Oxy IR/ROXICODONE) immediate release tablet 5 mg (not administered)  ondansetron (ZOFRAN) injection 4 mg (not administered)  ibuprofen (ADVIL,MOTRIN) tablet 600 mg (600 mg Oral Given 12/09/13 0720)  clindamycin (CLEOCIN) IVPB 600 mg (0 mg Intravenous Stopped 12/09/13 1004)  HYDROmorphone (DILAUDID) injection 1 mg (1 mg Intravenous Given 12/09/13 1011)   Filed Vitals:   12/09/13 0929 12/09/13 0945 12/09/13 1000 12/09/13 1015  BP: 123/62 118/71 124/73 132/86  Pulse: 75 75 77 83  Temp: 97.6 F (36.4 C)     TempSrc: Oral     Resp: 18     Height:      Weight:      SpO2: 98% 96% 100% 100%   This provider reviewed patient's chart. Patient had surgery on 11/16/2013 for hydradenitis suppurativa performed by Dr. Hassell Done. Patient was placed on doxycycline just finished course. CBC negative elevated white cell count. BMP negative findings-potassium mildly low at 3.6. Anion gap of 15.0 mg/L. Urine pregnancy negative. US of the left axilla identified findings that are consistent with abscess measuring approximately 4.1 x 3.5 x 2.1 cm.  This provider spoke with surgery, Dr. Grandville Silos - to come and assess.  Patient was seen and assessed by surgery-I&D performed at bedside. Patient admitted to surgery for recurrent left axilla abscess for IV antibiotics to be administered. Discussed plan for admission with patient who agreed and understood plan of care. Patient stable for transfer.  Jamse Mead, PA-C 12/09/13 1718

## 2013-12-10 ENCOUNTER — Telehealth (INDEPENDENT_AMBULATORY_CARE_PROVIDER_SITE_OTHER): Payer: Self-pay | Admitting: General Surgery

## 2013-12-10 LAB — CBC
HCT: 38.1 % (ref 36.0–46.0)
HEMOGLOBIN: 12.2 g/dL (ref 12.0–15.0)
MCH: 28.1 pg (ref 26.0–34.0)
MCHC: 32 g/dL (ref 30.0–36.0)
MCV: 87.8 fL (ref 78.0–100.0)
Platelets: 272 10*3/uL (ref 150–400)
RBC: 4.34 MIL/uL (ref 3.87–5.11)
RDW: 13.8 % (ref 11.5–15.5)
WBC: 4.5 10*3/uL (ref 4.0–10.5)

## 2013-12-10 MED ORDER — OXYCODONE HCL 5 MG PO TABS: 5.0000 mg | ORAL_TABLET | ORAL | Status: DC | PRN

## 2013-12-10 MED ORDER — CLINDAMYCIN HCL 300 MG PO CAPS: 300.0000 mg | ORAL_CAPSULE | Freq: Three times a day (TID) | ORAL | Status: DC

## 2013-12-10 NOTE — Telephone Encounter (Signed)
LMOM for patient to call back and ask for Kei Langhorst, she needs an apt with Dr Blackman 

## 2013-12-10 NOTE — Discharge Summary (Signed)
I have seen and examined the patient and agree with the assessment and plans.  Tangia Pinard A. Navarre Diana  MD, FACS  

## 2013-12-10 NOTE — ED Provider Notes (Signed)
Medical screening examination/treatment/procedure(s) were performed by non-physician practitioner and as supervising physician I was immediately available for consultation/collaboration.   EKG Interpretation None        Malvin Johns, MD 12/10/13 2020

## 2013-12-10 NOTE — Procedures (Signed)
Incision and Drainage Procedure Note  Pre-operative Diagnosis: left axillary abscess  Post-operative Diagnosis: same  Indications: infected left axillary abscess  Anesthesia: lidocaine with epinephrine(32ml)  Procedure Details  The procedure, risks and complications have been discussed in detail (including, but not limited to airway compromise, infection, bleeding) with the patient, and the patient has signed consent to the procedure.  The skin was sterilely prepped and draped over the affected area in the usual fashion. After adequate local anesthesia, I&D with a #11 blade was performed on the left mid axillae.  Abscess size: 3x3x2cm.  drainage: present.  Culture was obtained. Hmostass was ensured.  The wound was packed with 1/4 iodoform.  e patient was observed until stable.  Findings: As above  EBL: <5cc's  Drains: none  Condition: Tolerated procedure well and Stable   Complications: none.  Delphin Funes, ANP-BC

## 2013-12-10 NOTE — Progress Notes (Signed)
Discharge instructions/prescriptions for oxycodone and cleocin explained and given to pt.  Pt verbalizes understanding of all instructions/orders.  IV removed.  Pt has received no pain meds since last pm.  Pt stable for discharge.  VSS.  Pt discharged to home. Syliva Overman

## 2013-12-10 NOTE — Discharge Summary (Signed)
Patient ID: Victoria Rodriguez MRN: [id number] DOB/AGE: [birth year] 33 y.o.  Admit date: 12/09/2013 Discharge date: 12/10/2013  Procedures: Incision and drainage of left axillary abscess  Consults: None  Reason for Admission: Victoria Rodriguez is a 33 year old female with a history of hidradenitis suppurativa s/p excision by Dr. Rise Patience, then followed by Dr. Lilyan Punt. She had an I&D under anesthesia of the left axillary abscess by Dr. Hassell Done on 11/16/13. She was admitted, treated with IV antibiotics and subsequently sent home. She followed up with Dr. Harlow Asa last week at which time she reported some pain. Thereafter, she developed increased swelling. Denies drainage, fever, or chills. Rates her pain 7/10. No modifying factors. No aggravating or alleviating factors. Denies systemic symptoms. She is otherwise healthy.   Admission Diagnoses:  1. Left axillary abscess 2. Hidradenitis suppurativa  Hospital Course: The patient was admitted and placed on IV clindamycin. Her axillary abscess was incised and drained at the bedside. Packing was placed. The patient tolerated this well. On postoperative day #1, the patient was doing well. Her pain was controlled. She had no further erythema or purulent drainage. She was stable for discharge home.  PE: Axilla: Wound is clean. Dressing is changed. No purulent drainage, induration, or erythema is noted.  Discharge Diagnoses:  Active Problems:   Axillary abscess s/p I&D  Discharge Medications:   Medication List    TAKE these medications       clindamycin 300 MG capsule  Commonly known as:  CLEOCIN  Take 1 capsule (300 mg total) by mouth 3 (three) times daily.     oxyCODONE 5 MG immediate release tablet  Commonly known as:  Oxy IR/ROXICODONE  Take 1-2 tablets (5-10 mg total) by mouth every 4 (four) hours as needed for moderate pain.      ASK your doctor about these medications       etonogestrel 68 MG Impl implant  Commonly known as:  IMPLANON   Inject 1 each into the skin once. [facility]        Discharge Instructions: Follow-up Information   Follow up with [facility] A, MD. Schedule an appointment as soon as possible for a visit in 3 weeks.   Specialty:  General Surgery   Contact information:   [address] [place] [address] [phone]       Signed: Henreitta Cea 12/10/2013, 9:25 AM

## 2013-12-10 NOTE — Procedures (Signed)
I supervised this procedure Georganna Skeans, MD, MPH, FACS Trauma: [phone] General Surgery: [phone]

## 2013-12-10 NOTE — Telephone Encounter (Signed)
Message copied by Maryclare Bean on Mon Dec 10, 2013  3:41 PM ------      Message from: Ivor Costa      Created: Mon Dec 10, 2013  2:26 PM      Regarding: po appt       Patient seen 12/09/13.  D/C papers say to follow up with Dr. Ninfa Linden in 2-3 weeks.  I was going to schedule her po appt but, I could not find any time except for 12/24/13 @ 11:50 am.              Alyse Low      ----- Message -----         From: Aviva Signs         Sent: 12/03/2013   9:46 AM           To: Ivor Costa, CMA            Pt needs 2-3 week PO with Dr Hassell Done please       ------

## 2013-12-10 NOTE — Discharge Instructions (Signed)

## 2013-12-12 LAB — WOUND CULTURE

## 2013-12-18 ENCOUNTER — Ambulatory Visit (INDEPENDENT_AMBULATORY_CARE_PROVIDER_SITE_OTHER): Payer: Medicaid Other | Admitting: Family Medicine

## 2013-12-18 ENCOUNTER — Encounter: Payer: Self-pay | Admitting: Family Medicine

## 2013-12-18 VITALS — BP 110/70 | HR 68 | Temp 98.3°F | Ht 63.0 in | Wt 194.0 lb

## 2013-12-18 DIAGNOSIS — N898 Other specified noninflammatory disorders of vagina: Secondary | ICD-10-CM

## 2013-12-18 DIAGNOSIS — N899 Noninflammatory disorder of vagina, unspecified: Secondary | ICD-10-CM

## 2013-12-18 DIAGNOSIS — R319 Hematuria, unspecified: Secondary | ICD-10-CM

## 2013-12-18 DIAGNOSIS — R300 Dysuria: Secondary | ICD-10-CM

## 2013-12-18 LAB — POCT WET PREP (WET MOUNT): CLUE CELLS WET PREP WHIFF POC: NEGATIVE

## 2013-12-18 LAB — POCT URINALYSIS DIPSTICK
Bilirubin, UA: NEGATIVE
Glucose, UA: NEGATIVE
Ketones, UA: NEGATIVE
Leukocytes, UA: NEGATIVE
Nitrite, UA: NEGATIVE
PH UA: 6
PROTEIN UA: 30
Urobilinogen, UA: 0.2

## 2013-12-18 MED ORDER — FLUCONAZOLE 150 MG PO TABS: 150.0000 mg | ORAL_TABLET | Freq: Once | ORAL | Status: DC

## 2013-12-18 NOTE — Assessment & Plan Note (Signed)
Patient was able to give only scant urine no enough for micro or culture.  Do not feel this is likely significatn.  Recommend recheck in a week or so.  No specific symptoms of uti

## 2013-12-18 NOTE — Progress Notes (Signed)
Subjective:     Patient ID: Victoria Rodriguez, female   DOB: Aug 28, 1980, 33 y.o.   MRN: [id number]  HPI  S/p I&D left axillary abscess:  Victoria Rodriguez is a 33 yo woman with a PMH of hidradenitis suppurativa that presents to the clinic today for a f/u of I&D under anesthesia of a left axillary abscess on 11/16/2013. She returned to the ED on 12/09/2013 due to increased swelling and pain to the left axillary area, and at this time a repeat I&D was performed without anesthesia and IV antibiotics were given. She was sent home on Clindamycin 300 mg 3x daily for 10 days. Currently, the wound is still open, but she reports it is draining less now, and the swelling has improved. It is still painful to the touch and painful with arm movement, but she reports this is improving some since the procedure. She has completed her full course of Clindamycin.   Vaginal itching/irritation: The patient also reports vaginal itching/irritation for the past 1 week. She also reports urinary frequency and mild central lower back pain. She denies any abnormal vaginal bleeding, vaginal discharge, hematuria, urinary urgency, abdominal pain, pelvic pain, fevers, or chills.  She is unsure when her LNMP was because she has the Nexplanon and does not always get a period.   Review of Systems  No fevers, chills, chest pain, or shortness of breath.  As per HPI.     Objective:   Physical Exam Vitals: BP 110/70 P 68 Temp 98.3 F Ht 5'3'' Wt 194 lbs.  General: Well-appearing. NAD.  Cardiac: RRR. No murmurs, rubs, or gallops.  Lungs: CTA. No wheezes. No crackles.  Abdominal: Soft. Non-tender. No CVA tenderness.  Extremities: 1 in. x 1 cm open wound to left axilla. Tenderness to palpation around the wound. No erythema. No drainage. No signs of infection. Appears to be healing well.  Pelvic: External genitalia normal.  OS was not visible due to retroverted uterus. Scant white discharge seen on exam. No blood.  Mild irritation and  erythema of vaginal mucosa. No severe pain on examination. Wet prep obtained. Bimanual exam not performed.      Assessment:     Victoria Rodriguez is a 33 yo woman with a PMH of hidradenitis suppurativa that presents to the clinic for f/u of I&D of left axillary abscess with anesthesia on 11/16/2013 repeated on 12/09/2013 w/out anesthesia, who now complains of vaginal itching/irritation for the past 1 week while taking Clindamycin 300 mg 3x daily for 10 days for the axillary abscess.     Plan:     1. Vaginal irritation/urinary frequency:  The differential diagnosis includes vaginal candidiasis, UTI, bacterial vaginosis, trichomonas, and other sexually transmitted infections. This is most likely vaginal candidiasis since the patient reports vaginal itching/irritation after she has completed her course of Clindamycin 300 mg 3x daily for 10 days. This does not fit the description of a UTI perfectly, but she does complain of urinary frequency and mild central lower back pain which could be suggestive of a UTI. This is unlikely pyelonephritis because she denies fever, chills, nausea, and had no CVA tenderness on exam. Bacterial vaginosis, trichomonas, and other sexually transmitted infections are less likely since the patient denies any new vaginal discharge. The patient is also not concerned about STI at this time.   -Wet prep was obtained to r/o bacterial vaginosis, trichomonas, and vaginal candidiasis. No clue cells were found and whiff test was negative making bacterial vaginosis less likely. The patient had no  signs of trichomonas on wet prep, and strawberry cervix was not noted on exam. The patient had no signs of yeast on wet prep, but her symptoms and recent treatment with antibiotic therapy does still make vaginal candidiasis likely.  -Will prescribe Diflucan 150 mg PO once to treat possible yeast infection.   -Counseled patient that if symptoms do not improve in the next few days, she should return to  the office.  -UA was obtained to r/o UTI. The UA was negative for leukocytes, nitrites, and bacteria, but there was a large amount of blood in the urine. The patient is not currently on her menstrual cycle.  -Counseled patient to return to the office in a few weeks for a nurse's visit to have a repeat UA to make sure the hematuria has cleared.  -Patient should return earlier if she develops fevers, chills, severe abdominal pain, or worsening urinary symptoms   2. S/p I&D of left axillary abscess:  The area appears to be healing well. No erythema or drainage. No signs of infection.  -Counsel patient to continue to keep the area clean and covered.  -Follow up with [place] or surgeon if she develops fever/chills, the area starts to swell, becomes more painful, or begins to drain more   Maudie Mercury MS3 12/18/2013

## 2013-12-18 NOTE — Progress Notes (Signed)
   Subjective:    Patient ID: Victoria Rodriguez, female    DOB: [birth year], 33 y.o.   MRN: [id number]  HPI  Axillary Abscess Status post ID.  Feels is healing well.  No discharge or fever or severe pain  Vaginal Itching For last week or so.  No notable discharge but does itch and burns with urination.  Mild urinary frequency.  Has had uti before but does not feel exaclty like that.  No menstrual period that she is aware of.     No lateral back pain or fever or nausea or vomiting   Chief Complaint noted Review of Symptoms - see HPI PMH - Smoking status noted.   Vital Signs reviewed    Review of Systems     Objective:   Physical Exam Alert no acute distress No CVAT Abdomen - soft nontender Genitalia:  Normal introitus for age, no external lesions, no vaginal discharge, mucosa pink and moist, no vaginal or cervical lesions, no vaginal atrophy, no friaility or hemorrhage, scant white discharge       Assessment & Plan:   Vaginal itching - most consistent with candida - will treat for this and monitor  Axillary Abscess - doing well after ID

## 2013-12-18 NOTE — Patient Instructions (Signed)
Good to see you today!  Thanks for coming in.  We will call you with the results of your tests  If the wound gets worse - fever or discharge call us or your surgeon

## 2013-12-21 ENCOUNTER — Encounter: Payer: Self-pay | Admitting: Family Medicine

## 2013-12-21 ENCOUNTER — Ambulatory Visit (INDEPENDENT_AMBULATORY_CARE_PROVIDER_SITE_OTHER): Payer: Medicaid Other | Admitting: Family Medicine

## 2013-12-21 VITALS — BP 121/84 | HR 80 | Temp 98.2°F | Wt 194.0 lb

## 2013-12-21 DIAGNOSIS — L732 Hidradenitis suppurativa: Secondary | ICD-10-CM

## 2013-12-21 MED ORDER — DOXYCYCLINE HYCLATE 100 MG PO TABS: 100.0000 mg | ORAL_TABLET | Freq: Two times a day (BID) | ORAL | Status: DC

## 2013-12-21 MED ORDER — CLINDAMYCIN PHOSPHATE 1 % EX SOLN: Freq: Two times a day (BID) | CUTANEOUS | Status: DC

## 2013-12-21 MED ORDER — OXYCODONE HCL 5 MG PO TABS: 5.0000 mg | ORAL_TABLET | ORAL | Status: DC | PRN

## 2013-12-21 NOTE — Patient Instructions (Signed)
Continue warm compresses Do clindamycin twice daily onto the skin in your R armpit I sent in doxycycline for you to take for 10 days Return if it is not improving or if you develop a fever, nausea/vomiting, or other concerns.  Be well, Dr. Ardelia Mems

## 2013-12-22 NOTE — Addendum Note (Signed)
Addendum created 12/22/13 0743 by Myrtie Soman, MD   Modules edited: Anesthesia Responsible Staff

## 2013-12-24 NOTE — Assessment & Plan Note (Addendum)
R axillary nodule: pt strongly prefers not to have I&D done today. May eventually require this, but reasonable to try medical therapy first. Plan: -rx PO doxycycline for antibiotic coverage (note: pt has nexplanon, not concerned for pregnancy) -also topical clindamycin solution BID -small quantity of percocet given for pain control -f/u if not improving.  L axillary prior I&D: healing well without further intervention required at this time. Precepted with Dr. Ree Kida who also examined patient and agrees with this plan.

## 2013-12-24 NOTE — Progress Notes (Signed)
Patient ID: Victoria Rodriguez, female   DOB: 08/10/1980, 33 y.o.   MRN: [id number]  HPI:  Pt presents for a same day appointment to discuss pain under R arm.  Pt has hx of hidradenitis suppurativa and recently had surgical I&D of LEFT axillary abscess. Her RIGHT axilla has been hurting for about 2 days. Has had a bump there for several weeks but it is now getting bigger and is sore. No fevers. Good PO intake. Has required multiple I&D's in the past for axillary abscesses. She strongly prefers to not have an I&D done today.   ROS: See HPI  Tift: hx obesity, keratosis punctata, hidradenitis suppuritiva  PHYSICAL EXAM: BP 121/84  Pulse 80  Temp(Src) 98.2 F (36.8 C) (Oral)  Wt 194 lb (87.998 kg) Gen: NAD HEENT: NCAT Neuro: grossly nonfocal, speech intact L axilla: prior surgical I&D site remains open, but healing well, no surrounding erythema or drainage R axilla: Marble-sized nodule in middle of axilla which is quite tender to palpation. Indurated and mildly fluctuant. Scarring present over much of axilla from prior I&D's. No erythema, warmth, skin breakdown.  ASSESSMENT/PLAN:  Hidradenitis suppurativa R axillary nodule: pt strongly prefers not to have I&D done today. May eventually require this, but reasonable to try medical therapy first. Plan: -rx PO doxycycline for antibiotic coverage (note: pt has nexplanon, not concerned for pregnancy) -also topical clindamycin solution BID -small quantity of percocet given for pain control -f/u if not improving.  L axillary prior I&D: healing well without further intervention required at this time. Precepted with Dr. Ree Kida who also examined patient and agrees with this plan.    FOLLOW UP: F/u as needed if symptoms worsen or do not improve.   [place]. Ardelia Mems, [place]

## 2014-01-05 ENCOUNTER — Encounter (HOSPITAL_COMMUNITY): Payer: Self-pay | Admitting: Emergency Medicine

## 2014-01-05 ENCOUNTER — Emergency Department (HOSPITAL_COMMUNITY)
Admission: EM | Admit: 2014-01-05 | Discharge: 2014-01-05 | Disposition: A | Discharge status: Home / Self Care | Payer: Medicaid Other | Attending: Emergency Medicine | Admitting: Emergency Medicine

## 2014-01-05 DIAGNOSIS — Z792 Long term (current) use of antibiotics: Secondary | ICD-10-CM | POA: Diagnosis not present

## 2014-01-05 DIAGNOSIS — IMO0002 Reserved for concepts with insufficient information to code with codable children: Secondary | ICD-10-CM | POA: Insufficient documentation

## 2014-01-05 DIAGNOSIS — Z79899 Other long term (current) drug therapy: Secondary | ICD-10-CM | POA: Insufficient documentation

## 2014-01-05 DIAGNOSIS — L732 Hidradenitis suppurativa: Secondary | ICD-10-CM | POA: Insufficient documentation

## 2014-01-05 MED ORDER — OXYCODONE-ACETAMINOPHEN 5-325 MG PO TABS: 1.0000 | ORAL_TABLET | ORAL | Status: DC | PRN

## 2014-01-05 MED ORDER — CLINDAMYCIN HCL 300 MG PO CAPS: 300.0000 mg | ORAL_CAPSULE | Freq: Four times a day (QID) | ORAL | Status: DC

## 2014-01-05 NOTE — Discharge Instructions (Signed)
Hidradenitis Suppurativa, Sweat Gland Abscess Hidradenitis suppurativa is a long lasting (chronic), uncommon disease of the sweat glands. With this, boil-like lumps and scarring develop in the groin, some times under the arms (axillae), and under the breasts. It may also uncommonly occur behind the ears, in the crease of the buttocks, and around the genitals.  CAUSES  The cause is from a blocking of the sweat glands. They then become infected. It may cause drainage and odor. It is not contagious. So it cannot be given to someone else. It most often shows up in puberty (about 10 to 34 years of age). But it may happen much later. It is similar to acne which is a disease of the sweat glands. This condition is slightly more common in African-Americans and women. SYMPTOMS   Hidradenitis usually starts as one or more red, tender, swellings in the groin or under the arms (axilla).  Over a period of hours to days the lesions get larger. They often open to the skin surface, draining clear to yellow-colored fluid.  The infected area heals with scarring. DIAGNOSIS  Your caregiver makes this diagnosis by looking at you. Sometimes cultures (growing germs on plates in the lab) may be taken. This is to see what germ (bacterium) is causing the infection.  TREATMENT   Topical germ killing medicine applied to the skin (antibiotics) are the treatment of choice. Antibiotics taken by mouth (systemic) are sometimes needed when the condition is getting worse or is severe.  Avoid tight-fitting clothing which traps moisture in.  Dirt does not cause hidradenitis and it is not caused by poor hygiene.  Involved areas should be cleaned daily using an antibacterial soap. Some patients find that the liquid form of Lever 2000, applied to the involved areas as a lotion after bathing, can help reduce the odor related to this condition.  Sometimes surgery is needed to drain infected areas or remove scarred tissue. Removal of  large amounts of tissue is used only in severe cases.  Birth control pills may be helpful.  Oral retinoids (vitamin A derivatives) for 6 to 12 months which are effective for acne may also help this condition.  Weight loss will improve but not cure hidradenitis. It is made worse by being overweight. But the condition is not caused by being overweight.  This condition is more common in people who have had acne.  It may become worse under stress. There is no medical cure for hidradenitis. It can be controlled, but not cured. The condition usually continues for years with periods of getting worse and getting better (remission). Document Released: 12/30/2003 Document Revised: 08/09/2011 Document Reviewed: 08/17/2013 [employer] Patient Information 2015 [employer]. This information is not intended to replace advice given to you by your health care provider. Make sure you discuss any questions you have with your health care provider.  

## 2014-01-05 NOTE — ED Provider Notes (Signed)
CSN: [id number]     Arrival date & time 01/05/14  1536 History   First MD Initiated Contact with Patient 01/05/14 1549     Chief Complaint  Patient presents with  . Abscess      HPI Patient reports is long-standing history of suppurative hidradenitis.  She has undergone multiple incision and drainage of her axillary regions.  She reports several weeks ago she was started on and bikes warm compresses first right axillary swelling.  She states that significantly improved and then seems to be worsening again.  She's had no drainage.  She also reports some discomfort in her left axillary region.  No fevers or chills.  She follows closely with [facility] for her hidradenitis.  No other complaints.  Symptoms are mild to moderate in severity   Past Medical History  Diagnosis Date  . Hidradenitis suppurativa     surgical   Past Surgical History  Procedure Laterality Date  . Axillary hidradenitis excision  2011    bilateral  . Irrigation and debridement abscess Left 11/16/2013    Procedure: IRRIGATION AND DEBRIDEMENT LEFT AXILLARY ABSCESS;  Surgeon: Pedro Earls, MD;  Location: [facility];  Service: General;  Laterality: Left;   Family History  Problem Relation Age of Onset  . Asthma Mother   . Diabetes Father   . Asthma Sister   . Asthma Brother   . Cancer Neg Hx   . Heart disease Neg Hx   . Stroke Neg Hx    History  Substance Use Topics  . Smoking status: Never Smoker   . Smokeless tobacco: Never Used  . Alcohol Use: Yes     Comment: drinks monthly or less.   OB History   Grav Para Term Preterm Abortions TAB SAB Ect Mult Living   3 1 1  0 2 2    1      Review of Systems  All other systems reviewed and are negative.     Allergies  Vicodin  Home Medications   Prior to Admission medications   Medication Sig Start Date End Date Taking? Authorizing Provider  clindamycin (CLEOCIN-T) 1 % external solution Apply topically 2 (two) times daily. 12/21/13   Leeanne Rio, MD  etonogestrel (IMPLANON) 68 MG IMPL implant Inject 1 each into the skin once. [facility]    Historical Provider, MD  oxyCODONE (OXY IR/ROXICODONE) 5 MG immediate release tablet Take 1-2 tablets (5-10 mg total) by mouth every 4 (four) hours as needed for moderate pain. 12/21/13   Leeanne Rio, MD   BP 124/75  Pulse 89  Temp(Src) 98.5 F (36.9 C)  Resp 17  Ht 5\' 3"  (1.6 m)  Wt 195 lb (88.451 kg)  BMI 34.55 kg/m2  SpO2 100% Physical Exam  Nursing note and vitals reviewed. Constitutional: She is oriented to person, place, and time. She appears well-developed and well-nourished. No distress.  HENT:  Head: Normocephalic and atraumatic.  Eyes: EOM are normal.  Neck: Normal range of motion.  Cardiovascular: Normal rate, regular rhythm and normal heart sounds.   Pulmonary/Chest: Effort normal and breath sounds normal.  Abdominal: Soft. She exhibits no distension. There is no tenderness.  Musculoskeletal: Normal range of motion.  Small induration in bilateral axillary regions.  No erythema.  In her right axillary there is small punctate area of tenderness without obvious fluctuance.  Primarily induration in this region without erythema, warmth, crepitus.  In her left axillary region there is also  a small area of induration without fluctuance.  No erythema or warmth in this region either.  Neurological: She is alert and oriented to person, place, and time.  Skin: Skin is warm and dry.  Psychiatric: She has a normal mood and affect. Judgment normal.    ED Course  Procedures (including critical care time) Labs Review Labs Reviewed - No data to display  Imaging Review No results found.   EKG Interpretation None      MDM   Final diagnoses:  Hidradenitis suppurativa    Both axillary regions have small punctate areas of induration and tenderness.  Am not convinced the defined size these at this time a large amount of possibly  obtained.  I would prefer to take more of a supportive course with antibiotics warm compresses.  Have asked that she call the Gen. surgery office on Monday for further evaluation.  She may indeed require incision and drainage at some point but at this time given her complex history I do not think will be a huge amount of benefit from incision and drainage today in the emergency room    Hoy Morn, MD 01/05/14 [phone]

## 2014-01-05 NOTE — ED Notes (Addendum)
Pt c/o multiple areas of abscess longest noted 2 weeks ago. Pt has history of multiple surgeries for same. Last surgery was in June under left arm x 2. Pt has abscess to left breast, between, under left arm and right arm.

## 2014-01-14 ENCOUNTER — Ambulatory Visit (INDEPENDENT_AMBULATORY_CARE_PROVIDER_SITE_OTHER): Payer: Medicaid Other | Admitting: Surgery

## 2014-01-14 ENCOUNTER — Encounter (INDEPENDENT_AMBULATORY_CARE_PROVIDER_SITE_OTHER): Payer: Self-pay | Admitting: Surgery

## 2014-01-14 VITALS — BP 102/70 | HR 68 | Resp 18 | Ht 63.0 in | Wt 193.0 lb

## 2014-01-14 DIAGNOSIS — Z09 Encounter for follow-up examination after completed treatment for conditions other than malignant neoplasm: Secondary | ICD-10-CM

## 2014-01-14 MED ORDER — MUPIROCIN CALCIUM 2 % EX CREA: TOPICAL_CREAM | CUTANEOUS | Status: DC

## 2014-01-14 MED ORDER — CLINDAMYCIN HCL 300 MG PO CAPS: 300.0000 mg | ORAL_CAPSULE | Freq: Three times a day (TID) | ORAL | Status: DC

## 2014-01-14 NOTE — Patient Instructions (Signed)
Our schedulers will call you back to schedule surgery

## 2014-01-14 NOTE — Progress Notes (Signed)
Subjective:     Patient ID: Victoria Rodriguez, female   DOB: [birth year], 33 y.o.   MRN: [id number]  HPI This is a patient known to our office with a history of hidradenitis status post wide excision in the past. She is status post incision and drainage of left axillary abscesses several weeks ago.She is now recurring in the right axilla as well. She is off antibiotics except for clindamycin solution  Review of Systems     Objective:   Physical Exam On exam, she has multiple draining sinus tracts in the left axilla in one small area in the right    Assessment:     Chronic hidradenitis     Plan:     I am going to stop the clindamycin solution and try Bactroban as well as restart clindamycin orally.We discussed her head rhinitis and we'll proceed with a wide excision of the hidradenitis in the left axilla. I discussed this with her in detail.

## 2014-01-25 ENCOUNTER — Other Ambulatory Visit (INDEPENDENT_AMBULATORY_CARE_PROVIDER_SITE_OTHER): Payer: Self-pay | Admitting: Surgery

## 2014-01-25 MED ORDER — MUPIROCIN 2 % EX OINT: 1.0000 "application " | TOPICAL_OINTMENT | Freq: Two times a day (BID) | CUTANEOUS | Status: DC

## 2014-01-30 ENCOUNTER — Ambulatory Visit (INDEPENDENT_AMBULATORY_CARE_PROVIDER_SITE_OTHER): Payer: Medicaid Other | Admitting: Family Medicine

## 2014-01-30 ENCOUNTER — Encounter: Payer: Self-pay | Admitting: Family Medicine

## 2014-01-30 VITALS — BP 113/79 | HR 68 | Temp 98.3°F | Wt 192.0 lb

## 2014-01-30 DIAGNOSIS — L732 Hidradenitis suppurativa: Secondary | ICD-10-CM

## 2014-01-30 MED ORDER — OXYCODONE HCL 5 MG PO TABS: 5.0000 mg | ORAL_TABLET | ORAL | Status: DC | PRN

## 2014-01-30 MED ORDER — DOXYCYCLINE HYCLATE 100 MG PO TABS: 100.0000 mg | ORAL_TABLET | Freq: Two times a day (BID) | ORAL | Status: DC

## 2014-01-30 MED ORDER — ONDANSETRON 4 MG PO TBDP: 4.0000 mg | ORAL_TABLET | Freq: Three times a day (TID) | ORAL | Status: DC | PRN

## 2014-01-30 NOTE — Assessment & Plan Note (Signed)
Consistent with known chronic hidradenitis suppurativa, bilateral axillary and pubic region - No evidence of acute cellulitis or spreading infection, not suggestive of sepsis, afebrile, seems to be localized to focal abscesses, no active drainage or significant open tracts - Currently on Clindamycin per surgery, pending Left axillary repeat surgery  Plan: 1. Refilled Oxy IR 5mg  q 4 hr PRN (#60, 0 refill) for pain control until upcoming surgery 2. Broaden abx, add Doxycycline 100mg  BID x 10 days, continue Clindamycin 300mg  TID 3. Zofran ODT rx PRN nausea 4. [facility] to inquire about work-in apt for patient, soonest available is 02/19/14, scheduled. Also, Left Axillary surgery scheduled for 02/12/14

## 2014-01-30 NOTE — Progress Notes (Signed)
   Subjective:    Patient ID: Victoria Rodriguez, female    DOB: [birth year], 33 y.o.   MRN: [id number]  Patient presents for a same day appointment.  HPI  Suppurative Hidradenitis, Chronic: - Recently seen at [facility] about 2 weeks ago, she is currently waiting for a repeat surgery for Left arm axillary region. Last surgery in 10/2013 (Left arm), states this area has never healed up, had been subsequently drained in ED 11/2013 as well, currently still draining pus - Currently complaining of similar pain and concern for abscesses also in Right axillary region, and bilateral groin since last visit at [facility], about > 1 month - Reports she is unable to work due to pain, recently lost her job (has not returned to work following first surgery), previously employed in housekeeping  - States she called [facility] yesterday to find out when surgery would be scheduled, has not found out yet - Currently out of Percocet / Oxycodone, now using Motrin without relief - Taking Clindamycin 300mg  PO TID until surgery - Admits to fatigue, nausea, headaches, and "feels sick" for 4-5 days - Denies fever/chills, vomiting, diarrhea, redness or rash  I have reviewed and updated the following as appropriate: allergies and current medications  Social Hx: Never smoker  Review of Systems  See above HPI    Objective:   Physical Exam  BP 113/79  Pulse 68  Temp(Src) 98.3 F (36.8 C) (Oral)  Wt 192 lb (87.091 kg)  Gen - well-appearing, cooperative, NAD Skin - Bilateral Axillary Region: multiple healed linear surgical scars, Left axilla with sinus tract and palpable region of induration and fluctuance 2-3 cm, Right axilla sinus tract and smaller region 1-2 cm of induration and fluctuance, no evidence of erythema, bilateral axilla tender to palpation. Pubis region with bilateral sinus tracts, small areas of induration without erythema. No active drainage of pus on exam from any location. HEENT - MMM Heart - RRR, no  murmurs heard Abd - soft, NTND, +active BS Ext - non-tender, no edema, peripheral pulses intact +2 b/l    Assessment & Plan:   See specific A&P problem list for details.

## 2014-01-30 NOTE — Patient Instructions (Signed)
Dear Victoria Rodriguez, Thank you for coming in to clinic today.  Today we discussed your Suppurative Hidradenitis. 1. Exam is reassuring today that there is no superficial skin infection or spreading infection. It looks like your pain is localized to the abscess collections. 2. Most important thing is to follow-up with your Surgeon, and we called their office, next available appointment is 02/19/14. Please continue to call to find out when your surgery is scheduled for. 3. Prescribed Oxycodone for pain, take as needed up to 3 times daily 4. Prescribed Zofran for nausea 5. Prescribed Doxycycline for 10 days, take this along with your Clindamycin  Please schedule a follow-up appointment with Dr. McDiarmid in 1 to 2 weeks for follow-up if worsening. Otherwise if significant worsening symptoms, fevers, nausea / vomiting not responding to medicine, please go directly to the Emergency Department, they may be able to drain the abscess and call your Surgeon from there.  If you have any other questions or concerns, please feel free to call the clinic to contact me. You may also schedule an earlier appointment if necessary.  However, if your symptoms get significantly worse, please go to the Emergency Department to seek immediate medical attention.  [name], DO [facility]   Hidradenitis Suppurativa, Sweat Gland Abscess Hidradenitis suppurativa is a long lasting (chronic), uncommon disease of the sweat glands. With this, boil-like lumps and scarring develop in the groin, some times under the arms (axillae), and under the breasts. It may also uncommonly occur behind the ears, in the crease of the buttocks, and around the genitals.  CAUSES  The cause is from a blocking of the sweat glands. They then become infected. It may cause drainage and odor. It is not contagious. So it cannot be given to someone else. It most often shows up in puberty (about 63 to 34 years of age). But it may  happen much later. It is similar to acne which is a disease of the sweat glands. This condition is slightly more common in African-Americans and women. SYMPTOMS   Hidradenitis usually starts as one or more red, tender, swellings in the groin or under the arms (axilla).  Over a period of hours to days the lesions get larger. They often open to the skin surface, draining clear to yellow-colored fluid.  The infected area heals with scarring. DIAGNOSIS  Your caregiver makes this diagnosis by looking at you. Sometimes cultures (growing germs on plates in the lab) may be taken. This is to see what germ (bacterium) is causing the infection.  TREATMENT   Topical germ killing medicine applied to the skin (antibiotics) are the treatment of choice. Antibiotics taken by mouth (systemic) are sometimes needed when the condition is getting worse or is severe.  Avoid tight-fitting clothing which traps moisture in.  Dirt does not cause hidradenitis and it is not caused by poor hygiene.  Involved areas should be cleaned daily using an antibacterial soap. Some patients find that the liquid form of Lever 2000, applied to the involved areas as a lotion after bathing, can help reduce the odor related to this condition.  Sometimes surgery is needed to drain infected areas or remove scarred tissue. Removal of large amounts of tissue is used only in severe cases.  Birth control pills may be helpful.  Oral retinoids (vitamin A derivatives) for 6 to 12 months which are effective for acne may also help this condition.  Weight loss will improve but not cure hidradenitis. It is made worse by  being overweight. But the condition is not caused by being overweight.  This condition is more common in people who have had acne.  It may become worse under stress. There is no medical cure for hidradenitis. It can be controlled, but not cured. The condition usually continues for years with periods of getting worse and  getting better (remission). Document Released: 12/30/2003 Document Revised: 08/09/2011 Document Reviewed: 08/17/2013 [facility] Patient Information 2015 [place], [place]. This information is not intended to replace advice given to you by your health care provider. Make sure you discuss any questions you have with your health care provider.

## 2014-02-06 ENCOUNTER — Encounter (HOSPITAL_BASED_OUTPATIENT_CLINIC_OR_DEPARTMENT_OTHER): Payer: Self-pay | Admitting: *Deleted

## 2014-02-11 NOTE — H&P (Signed)
Victoria Rodriguez is an 33 y.o. female.   Chief Complaint: chronic hidradenitis HPI: she presents today with chronic hidradenitis with the plan is for wide excision of the left axilla. She has no other complaints.  Past Medical History  Diagnosis Date  . Hidradenitis suppurativa     surgical    Past Surgical History  Procedure Laterality Date  . Axillary hidradenitis excision  2011    bilateral  . Irrigation and debridement abscess Left 11/16/2013    Procedure: IRRIGATION AND DEBRIDEMENT LEFT AXILLARY ABSCESS;  Surgeon: Pedro Earls, MD;  Location: [facility];  Service: General;  Laterality: Left;    Family History  Problem Relation Age of Onset  . Asthma Mother   . Diabetes Father   . Asthma Sister   . Asthma Brother   . Cancer Neg Hx   . Heart disease Neg Hx   . Stroke Neg Hx    Social History:  reports that she has never smoked. She has never used smokeless tobacco. She reports that she drinks alcohol. She reports that she does not use illicit drugs.  Allergies:  Allergies  Allergen Reactions  . Vicodin [Hydrocodone-Acetaminophen] Nausea Only    No prescriptions prior to admission    No results found for this or any previous visit (from the past 48 hour(s)). No results found.  Review of Systems  All other systems reviewed and are negative.   Height 5\' 3"  (1.6 m), weight 195 lb (88.451 kg). Physical Exam  Constitutional: She appears well-developed and well-nourished.  HENT:  Head: Normocephalic and atraumatic.  Eyes: Pupils are equal, round, and reactive to light.  Neck: Normal range of motion.  Cardiovascular: Normal rate and regular rhythm.   Respiratory: Effort normal and breath sounds normal.  GI: There is no tenderness.  Musculoskeletal: Normal range of motion.  Neurological: She is alert.  Skin: Skin is warm.  Chronic skin changes in the left axilla  Psychiatric: Her behavior is normal.     Assessment/Plan Left axillary hidradenitis  We will  now proceed with wide excision of left axillary hidradenitis. She understands the risk which includes but is not limited to bleeding, infection, wound breakdown, recurrence, the fact this is not curable, need for further surgery, etc. She understands and wishes to proceed.  Ryin Schillo A 02/11/2014, 11:22 AM

## 2014-02-12 ENCOUNTER — Encounter (HOSPITAL_BASED_OUTPATIENT_CLINIC_OR_DEPARTMENT_OTHER)
Admission: RE | Disposition: A | Discharge status: Home / Self Care | Payer: Self-pay | Source: Ambulatory Visit | Attending: Surgery

## 2014-02-12 ENCOUNTER — Encounter (HOSPITAL_BASED_OUTPATIENT_CLINIC_OR_DEPARTMENT_OTHER): Payer: Self-pay | Admitting: Anesthesiology

## 2014-02-12 ENCOUNTER — Ambulatory Visit (HOSPITAL_BASED_OUTPATIENT_CLINIC_OR_DEPARTMENT_OTHER)
Admission: RE | Admit: 2014-02-12 | Discharge: 2014-02-12 | Disposition: A | Discharge status: Home / Self Care | Payer: Medicaid Other | Source: Ambulatory Visit | Attending: Surgery | Admitting: Surgery

## 2014-02-12 ENCOUNTER — Ambulatory Visit (HOSPITAL_BASED_OUTPATIENT_CLINIC_OR_DEPARTMENT_OTHER): Payer: Medicaid Other | Admitting: Anesthesiology

## 2014-02-12 ENCOUNTER — Encounter (HOSPITAL_BASED_OUTPATIENT_CLINIC_OR_DEPARTMENT_OTHER): Payer: Medicaid Other | Admitting: Anesthesiology

## 2014-02-12 DIAGNOSIS — L732 Hidradenitis suppurativa: Secondary | ICD-10-CM | POA: Diagnosis not present

## 2014-02-12 DIAGNOSIS — Z885 Allergy status to narcotic agent status: Secondary | ICD-10-CM | POA: Diagnosis not present

## 2014-02-12 HISTORY — PX: HYDRADENITIS EXCISION: SHX5243

## 2014-02-12 SURGERY — EXCISION, HIDRADENITIS, AXILLA
Anesthesia: General | Site: Axilla | Laterality: Left

## 2014-02-12 MED ORDER — FENTANYL CITRATE 0.05 MG/ML IJ SOLN
INTRAMUSCULAR | Status: AC
  Filled 2014-02-12: qty 6

## 2014-02-12 MED ORDER — CEFAZOLIN SODIUM-DEXTROSE 2-3 GM-% IV SOLR: 2.0000 g | Freq: Three times a day (TID) | INTRAVENOUS | Status: DC

## 2014-02-12 MED ORDER — LIDOCAINE HCL (CARDIAC) 20 MG/ML IV SOLN
INTRAVENOUS | Status: DC | PRN
  Administered 2014-02-12: 50 mg via INTRAVENOUS

## 2014-02-12 MED ORDER — HYDROMORPHONE HCL PF 1 MG/ML IJ SOLN
0.2500 mg | INTRAMUSCULAR | Status: DC | PRN
  Administered 2014-02-12 (×2): 0.5 mg via INTRAVENOUS

## 2014-02-12 MED ORDER — CLINDAMYCIN HCL 300 MG PO CAPS: 300.0000 mg | ORAL_CAPSULE | Freq: Three times a day (TID) | ORAL | Status: DC

## 2014-02-12 MED ORDER — SUCCINYLCHOLINE CHLORIDE 20 MG/ML IJ SOLN
INTRAMUSCULAR | Status: AC
  Filled 2014-02-12: qty 2

## 2014-02-12 MED ORDER — CEFAZOLIN SODIUM-DEXTROSE 2-3 GM-% IV SOLR
INTRAVENOUS | Status: AC
  Filled 2014-02-12: qty 50

## 2014-02-12 MED ORDER — BUPIVACAINE-EPINEPHRINE 0.5% -1:200000 IJ SOLN
INTRAMUSCULAR | Status: DC | PRN
  Administered 2014-02-12: 20 mL

## 2014-02-12 MED ORDER — FENTANYL CITRATE 0.05 MG/ML IJ SOLN
50.0000 ug | INTRAMUSCULAR | Status: DC | PRN
Start: 2014-02-12 — End: 2014-02-12

## 2014-02-12 MED ORDER — OXYCODONE HCL 5 MG/5ML PO SOLN: 5.0000 mg | Freq: Once | ORAL | Status: DC | PRN

## 2014-02-12 MED ORDER — SCOPOLAMINE 1 MG/3DAYS TD PT72
MEDICATED_PATCH | TRANSDERMAL | Status: AC
  Filled 2014-02-12: qty 1

## 2014-02-12 MED ORDER — PROMETHAZINE HCL 25 MG/ML IJ SOLN
6.2500 mg | INTRAMUSCULAR | Status: DC | PRN
  Administered 2014-02-12: 6.25 mg via INTRAVENOUS

## 2014-02-12 MED ORDER — DEXAMETHASONE SODIUM PHOSPHATE 4 MG/ML IJ SOLN
INTRAMUSCULAR | Status: DC | PRN
  Administered 2014-02-12: 10 mg via INTRAVENOUS

## 2014-02-12 MED ORDER — ONDANSETRON HCL 4 MG/2ML IJ SOLN
INTRAMUSCULAR | Status: DC | PRN
  Administered 2014-02-12: 4 mg via INTRAVENOUS

## 2014-02-12 MED ORDER — OXYCODONE HCL 5 MG PO TABS: 5.0000 mg | ORAL_TABLET | ORAL | Status: DC | PRN

## 2014-02-12 MED ORDER — MIDAZOLAM HCL 2 MG/2ML IJ SOLN: 1.0000 mg | INTRAMUSCULAR | Status: DC | PRN

## 2014-02-12 MED ORDER — PROMETHAZINE HCL 25 MG/ML IJ SOLN
INTRAMUSCULAR | Status: AC
  Filled 2014-02-12: qty 1

## 2014-02-12 MED ORDER — FENTANYL CITRATE 0.05 MG/ML IJ SOLN
INTRAMUSCULAR | Status: DC | PRN
  Administered 2014-02-12 (×3): 50 ug via INTRAVENOUS

## 2014-02-12 MED ORDER — PROPOFOL 10 MG/ML IV BOLUS
INTRAVENOUS | Status: DC | PRN
Start: 2014-02-12 — End: 2014-02-12
  Administered 2014-02-12: 200 mg via INTRAVENOUS

## 2014-02-12 MED ORDER — HYDROMORPHONE HCL PF 1 MG/ML IJ SOLN
INTRAMUSCULAR | Status: AC
  Filled 2014-02-12: qty 1

## 2014-02-12 MED ORDER — ONDANSETRON HCL 4 MG/2ML IJ SOLN: 4.0000 mg | Freq: Once | INTRAMUSCULAR | Status: DC | PRN

## 2014-02-12 MED ORDER — MIDAZOLAM HCL 2 MG/2ML IJ SOLN
INTRAMUSCULAR | Status: AC
  Filled 2014-02-12: qty 2

## 2014-02-12 MED ORDER — MIDAZOLAM HCL 5 MG/5ML IJ SOLN
INTRAMUSCULAR | Status: DC | PRN
  Administered 2014-02-12: 2 mg via INTRAVENOUS

## 2014-02-12 MED ORDER — BUPIVACAINE-EPINEPHRINE (PF) 0.5% -1:200000 IJ SOLN
INTRAMUSCULAR | Status: AC
  Filled 2014-02-12: qty 30

## 2014-02-12 MED ORDER — LACTATED RINGERS IV SOLN
INTRAVENOUS | Status: DC
  Administered 2014-02-12: 10 mL/h via INTRAVENOUS
  Administered 2014-02-12: 09:00:00 via INTRAVENOUS

## 2014-02-12 MED ORDER — CEFAZOLIN SODIUM-DEXTROSE 2-3 GM-% IV SOLR
INTRAVENOUS | Status: DC | PRN
  Administered 2014-02-12: 2 g via INTRAVENOUS

## 2014-02-12 MED ORDER — KETOROLAC TROMETHAMINE 30 MG/ML IJ SOLN
INTRAMUSCULAR | Status: DC | PRN
  Administered 2014-02-12: 30 mg via INTRAVENOUS

## 2014-02-12 MED ORDER — SCOPOLAMINE 1 MG/3DAYS TD PT72
1.0000 | MEDICATED_PATCH | TRANSDERMAL | Status: DC
  Administered 2014-02-12: 1.5 mg via TRANSDERMAL

## 2014-02-12 MED ORDER — OXYCODONE HCL 5 MG PO TABS: 5.0000 mg | ORAL_TABLET | Freq: Once | ORAL | Status: DC | PRN

## 2014-02-12 SURGICAL SUPPLY — 37 items
BLADE CLIPPER SURG (BLADE) IMPLANT
BLADE HEX COATED 2.75 (ELECTRODE) ×2 IMPLANT
BLADE SURG 15 STRL LF DISP TIS (BLADE) ×1 IMPLANT
BLADE SURG 15 STRL SS (BLADE) ×1
CANISTER SUCT 1200ML W/VALVE (MISCELLANEOUS) ×2 IMPLANT
CHLORAPREP W/TINT 26ML (MISCELLANEOUS) ×2 IMPLANT
COVER MAYO STAND STRL (DRAPES) ×2 IMPLANT
COVER TABLE BACK 60X90 (DRAPES) ×2 IMPLANT
DECANTER SPIKE VIAL GLASS SM (MISCELLANEOUS) IMPLANT
DRAPE PED LAPAROTOMY (DRAPES) ×2 IMPLANT
DRAPE UTILITY XL STRL (DRAPES) ×2 IMPLANT
ELECT REM PT RETURN 9FT ADLT (ELECTROSURGICAL) ×2
ELECTRODE REM PT RTRN 9FT ADLT (ELECTROSURGICAL) ×1 IMPLANT
GLOVE BIOGEL PI IND STRL 7.0 (GLOVE) ×1 IMPLANT
GLOVE BIOGEL PI INDICATOR 7.0 (GLOVE) ×1
GLOVE ECLIPSE 7.0 STRL STRAW (GLOVE) ×2 IMPLANT
GLOVE EXAM NITRILE EXT CUFF SM (GLOVE) ×2 IMPLANT
GLOVE SURG SIGNA 7.5 PF LTX (GLOVE) ×2 IMPLANT
GOWN STRL REUS W/ TWL LRG LVL3 (GOWN DISPOSABLE) ×1 IMPLANT
GOWN STRL REUS W/ TWL XL LVL3 (GOWN DISPOSABLE) ×1 IMPLANT
GOWN STRL REUS W/TWL LRG LVL3 (GOWN DISPOSABLE) ×1
GOWN STRL REUS W/TWL XL LVL3 (GOWN DISPOSABLE) ×1
NEEDLE HYPO 25X1 1.5 SAFETY (NEEDLE) ×2 IMPLANT
NS IRRIG 1000ML POUR BTL (IV SOLUTION) ×2 IMPLANT
PACK BASIN DAY SURGERY FS (CUSTOM PROCEDURE TRAY) ×2 IMPLANT
PENCIL BUTTON HOLSTER BLD 10FT (ELECTRODE) ×2 IMPLANT
SLEEVE SCD COMPRESS KNEE MED (MISCELLANEOUS) ×2 IMPLANT
SPONGE GAUZE 4X4 12PLY STER LF (GAUZE/BANDAGES/DRESSINGS) ×2 IMPLANT
SPONGE LAP 4X18 X RAY DECT (DISPOSABLE) ×2 IMPLANT
SUT ETHILON 2 0 FS 18 (SUTURE) ×6 IMPLANT
SUT ETHILON 3 0 FSL (SUTURE) IMPLANT
SYR BULB 3OZ (MISCELLANEOUS) ×2 IMPLANT
SYR CONTROL 10ML LL (SYRINGE) ×2 IMPLANT
TOWEL OR 17X24 6PK STRL BLUE (TOWEL DISPOSABLE) ×2 IMPLANT
TOWEL OR NON WOVEN STRL DISP B (DISPOSABLE) ×2 IMPLANT
TUBE CONNECTING 20X1/4 (TUBING) ×2 IMPLANT
YANKAUER SUCT BULB TIP NO VENT (SUCTIONS) ×2 IMPLANT

## 2014-02-12 NOTE — Anesthesia Procedure Notes (Signed)
Procedure Name: LMA Insertion Date/Time: 02/12/2014 9:24 AM Performed by: Toula Moos L Pre-anesthesia Checklist: Patient identified, Emergency Drugs available, Suction available, Patient being monitored and Timeout performed Patient Re-evaluated:Patient Re-evaluated prior to inductionOxygen Delivery Method: Circle System Utilized Preoxygenation: Pre-oxygenation with 100% oxygen Intubation Type: IV induction Ventilation: Mask ventilation without difficulty LMA: LMA inserted LMA Size: 4.0 Number of attempts: 1 Airway Equipment and Method: bite block Placement Confirmation: positive ETCO2 and breath sounds checked- equal and bilateral Tube secured with: Tape Dental Injury: Teeth and Oropharynx as per pre-operative assessment

## 2014-02-12 NOTE — Discharge Instructions (Signed)
May remove bandage and shower starting tomorrow  Cover with dry guaze  Expect drainage.    Post Anesthesia Home Care Instructions  Activity: Get plenty of rest for the remainder of the day. A responsible adult should stay with you for 24 hours following the procedure.  For the next 24 hours, DO NOT: -Drive a car -Paediatric nurse -Drink alcoholic beverages -Take any medication unless instructed by your physician -Make any legal decisions or sign important papers.  Meals: Start with liquid foods such as gelatin or soup. Progress to regular foods as tolerated. Avoid greasy, spicy, heavy foods. If nausea and/or vomiting occur, drink only clear liquids until the nausea and/or vomiting subsides. Call your physician if vomiting continues.  Special Instructions/Symptoms: Your throat may feel dry or sore from the anesthesia or the breathing tube placed in your throat during surgery. If this causes discomfort, gargle with warm salt water. The discomfort should disappear within 24 hours.

## 2014-02-12 NOTE — Interval H&P Note (Signed)
History and Physical Interval Note: no change in H and P  02/12/2014 8:51 AM  Victoria Rodriguez  has presented today for surgery, with the diagnosis of Hidradenitis Left Axilla  The various methods of treatment have been discussed with the patient and family. After consideration of risks, benefits and other options for treatment, the patient has consented to  Procedure(s): EXCISION HIDRADENITIS AXILLA (Left) as a surgical intervention .  The patient's history has been reviewed, patient examined, no change in status, stable for surgery.  I have reviewed the patient's chart and labs.  Questions were answered to the patient's satisfaction.     Corleone Biegler A

## 2014-02-12 NOTE — Anesthesia Preprocedure Evaluation (Signed)

## 2014-02-12 NOTE — Anesthesia Postprocedure Evaluation (Signed)
  Anesthesia Post-op Note  Patient: Victoria Rodriguez  Procedure(s) Performed: Procedure(s): EXCISION HIDRADENITIS AXILLA (Left)  Patient Location: PACU  Anesthesia Type: General   Level of Consciousness: awake, alert  and oriented  Airway and Oxygen Therapy: Patient Spontanous Breathing  Post-op Pain: mild  Post-op Assessment: Post-op Vital signs reviewed  Post-op Vital Signs: Reviewed  Last Vitals:  Filed Vitals:   02/12/14 1100  BP: 153/87  Pulse: 84  Temp:   Resp: 19    Complications: No apparent anesthesia complications

## 2014-02-12 NOTE — Transfer of Care (Signed)
Immediate Anesthesia Transfer of Care Note  Patient: Victoria Rodriguez  Procedure(s) Performed: Procedure(s): EXCISION HIDRADENITIS AXILLA (Left)  Patient Location: PACU  Anesthesia Type:General  Level of Consciousness: awake, oriented and patient cooperative  Airway & Oxygen Therapy: Patient Spontanous Breathing and Patient connected to face mask oxygen  Post-op Assessment: Report given to PACU RN and Post -op Vital signs reviewed and stable  Post vital signs: Reviewed and stable  Complications: No apparent anesthesia complications

## 2014-02-12 NOTE — Op Note (Signed)
EXCISION HIDRADENITIS AXILLA  Procedure Note  Victoria Rodriguez 02/12/2014   Pre-op Diagnosis: Hidradenitis Left Axilla     Post-op Diagnosis: same  Procedure(s): EXCISION HIDRADENITIS AXILLA (40 cm square)  Surgeon(s): Coralie Keens, MD  Anesthesia: General  Staff:  Circulator: Glenna Fellows, RN Scrub Person: Levora Angel, RN  Estimated Blood Loss: Minimal                         [name] A   Date: 02/12/2014  Time: 9:51 AM

## 2014-02-13 ENCOUNTER — Encounter (HOSPITAL_BASED_OUTPATIENT_CLINIC_OR_DEPARTMENT_OTHER): Payer: Self-pay | Admitting: Surgery

## 2014-02-13 NOTE — Op Note (Signed)
NAME:  Victoria Rodriguez, Victoria Rodriguez NO.:  [id number]  MEDICAL RECORD NO.:  [id number]  LOCATION:                                 FACILITY:  PHYSICIAN:  Coralie Keens, M.D. DATE OF BIRTH:  1980-09-07  DATE OF PROCEDURE:  02/12/2014 DATE OF DISCHARGE:  02/12/2014                              OPERATIVE REPORT   PREOPERATIVE DIAGNOSIS:  Hidradenitis, left axilla.  POSTOPERATIVE DIAGNOSIS:  Hidradenitis, left axilla.  PROCEDURE:  Wide excision of hidradenitis left axilla (40 cm2 subcutaneous tissue and skin).  SURGEON:  Coralie Keens, M.D.  ANESTHESIA:  General and 0.5% Marcaine with epinephrine.  ESTIMATED BLOOD LOSS:  Minimal.  FINDINGS:  The patient found to have several chronic draining sinus tracts in the left axilla.  Approximately 40 cm2.  The skin and subcutaneous tissue was excised with a scalpel.  PROCEDURE IN DETAIL:  The patient was brought to the operative room, and identified as [name].  She has placed supine on the operating table and anesthesia was induced.  Her left axilla was then prepped and draped in usual sterile fashion.  I anesthetized the surrounding skin with 0.5% Marcaine with epinephrine.  I then performed a wide excision of the multiple chronic draining sinus tracts with a scalpel.  I took this down to the axillary tissue with electrocautery.  The entire specimen was completely removed with the cautery.  It measured approximately 40 cm2.  I then anesthetized the wound further with Marcaine, irrigated with saline.  I achieved hemostasis with cautery.  I then closed the incision with interrupted 2-0 nylon sutures.  Gauze and tape were then applied.  The patient tolerated the procedure well.  All the counts were correct at the end of the procedure.  The patient was then extubated in the operating room and taken in stable condition to recovery room.     Coralie Keens, M.D.     DB/MEDQ  D:  02/12/2014  T:  02/12/2014   Job:  [id number]

## 2014-02-19 ENCOUNTER — Emergency Department (HOSPITAL_COMMUNITY): Payer: Medicaid Other

## 2014-02-19 ENCOUNTER — Encounter (HOSPITAL_COMMUNITY): Payer: Self-pay | Admitting: Emergency Medicine

## 2014-02-19 ENCOUNTER — Emergency Department (HOSPITAL_COMMUNITY)
Admission: EM | Admit: 2014-02-19 | Discharge: 2014-02-19 | Disposition: A | Discharge status: Home / Self Care | Payer: Medicaid Other | Attending: Emergency Medicine | Admitting: Emergency Medicine

## 2014-02-19 DIAGNOSIS — R197 Diarrhea, unspecified: Secondary | ICD-10-CM | POA: Diagnosis not present

## 2014-02-19 DIAGNOSIS — Z3202 Encounter for pregnancy test, result negative: Secondary | ICD-10-CM | POA: Insufficient documentation

## 2014-02-19 DIAGNOSIS — Z792 Long term (current) use of antibiotics: Secondary | ICD-10-CM | POA: Insufficient documentation

## 2014-02-19 DIAGNOSIS — Z872 Personal history of diseases of the skin and subcutaneous tissue: Secondary | ICD-10-CM | POA: Insufficient documentation

## 2014-02-19 DIAGNOSIS — E669 Obesity, unspecified: Secondary | ICD-10-CM | POA: Insufficient documentation

## 2014-02-19 DIAGNOSIS — R6883 Chills (without fever): Secondary | ICD-10-CM | POA: Diagnosis not present

## 2014-02-19 DIAGNOSIS — M79609 Pain in unspecified limb: Secondary | ICD-10-CM | POA: Diagnosis not present

## 2014-02-19 DIAGNOSIS — G8918 Other acute postprocedural pain: Secondary | ICD-10-CM | POA: Diagnosis not present

## 2014-02-19 DIAGNOSIS — R51 Headache: Secondary | ICD-10-CM | POA: Diagnosis not present

## 2014-02-19 LAB — COMPREHENSIVE METABOLIC PANEL
ALBUMIN: 3.7 g/dL (ref 3.5–5.2)
ALT: 6 U/L (ref 0–35)
AST: 13 U/L (ref 0–37)
Alkaline Phosphatase: 74 U/L (ref 39–117)
Anion gap: 14 (ref 5–15)
BUN: 5 mg/dL — ABNORMAL LOW (ref 6–23)
CALCIUM: 8.6 mg/dL (ref 8.4–10.5)
CO2: 21 mEq/L (ref 19–32)
Chloride: 103 mEq/L (ref 96–112)
Creatinine, Ser: 0.52 mg/dL (ref 0.50–1.10)
GFR calc non Af Amer: >90 mL/min (ref >90)
GLUCOSE: 77 mg/dL (ref 70–99)
POTASSIUM: 3.7 meq/L (ref 3.7–5.3)
Sodium: 138 mEq/L (ref 137–147)
TOTAL PROTEIN: 7.6 g/dL (ref 6.0–8.3)
Total Bilirubin: 0.2 mg/dL — ABNORMAL LOW (ref 0.3–1.2)

## 2014-02-19 LAB — URINALYSIS, ROUTINE W REFLEX MICROSCOPIC
Bilirubin Urine: NEGATIVE
Glucose, UA: NEGATIVE mg/dL
Ketones, ur: NEGATIVE mg/dL
Leukocytes, UA: NEGATIVE
NITRITE: NEGATIVE
PH: 7.5 (ref 5.0–8.0)
Protein, ur: NEGATIVE mg/dL
Specific Gravity, Urine: 1.011 (ref 1.005–1.030)
UROBILINOGEN UA: 0.2 mg/dL (ref 0.0–1.0)

## 2014-02-19 LAB — CBC WITH DIFFERENTIAL/PLATELET
BASOS ABS: 0 10*3/uL (ref 0.0–0.1)
Basophils Relative: 0 % (ref 0–1)
EOS ABS: 0 10*3/uL (ref 0.0–0.7)
EOS PCT: 1 % (ref 0–5)
HCT: 36.7 % (ref 36.0–46.0)
Hemoglobin: 12 g/dL (ref 12.0–15.0)
LYMPHS ABS: 2.2 10*3/uL (ref 0.7–4.0)
Lymphocytes Relative: 44 % (ref 12–46)
MCH: 27.6 pg (ref 26.0–34.0)
MCHC: 32.7 g/dL (ref 30.0–36.0)
MCV: 84.4 fL (ref 78.0–100.0)
Monocytes Absolute: 0.3 10*3/uL (ref 0.1–1.0)
Monocytes Relative: 5 % (ref 3–12)
NEUTROS PCT: 50 % (ref 43–77)
Neutro Abs: 2.5 10*3/uL (ref 1.7–7.7)
PLATELETS: 343 10*3/uL (ref 150–400)
RBC: 4.35 MIL/uL (ref 3.87–5.11)
RDW: 13.4 % (ref 11.5–15.5)
WBC: 4.9 10*3/uL (ref 4.0–10.5)

## 2014-02-19 LAB — URINE MICROSCOPIC-ADD ON

## 2014-02-19 LAB — POC URINE PREG, ED: PREG TEST UR: NEGATIVE

## 2014-02-19 MED ORDER — SODIUM CHLORIDE 0.9 % IV SOLN
1000.0000 mL | Freq: Once | INTRAVENOUS | Status: AC
  Administered 2014-02-19: 1000 mL via INTRAVENOUS

## 2014-02-19 MED ORDER — ONDANSETRON HCL 4 MG/2ML IJ SOLN
4.0000 mg | Freq: Once | INTRAMUSCULAR | Status: AC
  Administered 2014-02-19: 4 mg via INTRAVENOUS
  Filled 2014-02-19: qty 2

## 2014-02-19 MED ORDER — SODIUM CHLORIDE 0.9 % IV SOLN: 1000.0000 mL | INTRAVENOUS | Status: DC

## 2014-02-19 MED ORDER — SODIUM CHLORIDE 0.9 % IV SOLN
1000.0000 mL | Freq: Once | INTRAVENOUS | Status: AC
Start: 2014-02-19 — End: 2014-02-19
  Administered 2014-02-19: 1000 mL via INTRAVENOUS

## 2014-02-19 MED ORDER — HYDROMORPHONE HCL 1 MG/ML IJ SOLN
1.0000 mg | Freq: Once | INTRAMUSCULAR | Status: AC
  Administered 2014-02-19: 1 mg via INTRAVENOUS
  Filled 2014-02-19: qty 1

## 2014-02-19 NOTE — Discharge Instructions (Signed)

## 2014-02-19 NOTE — ED Notes (Signed)
Pt c/o HA and chills with body pain; pt sts pain from recent surgery to arm; pt sts some diarrhea; pt sts taking antibiotics

## 2014-02-19 NOTE — ED Notes (Signed)
Patient being transported to x-ray.

## 2014-02-19 NOTE — ED Provider Notes (Signed)
CSN: [id number]     Arrival date & time 02/19/14  1051 History   First MD Initiated Contact with Patient 02/19/14 1120     Chief Complaint  Patient presents with  . Headache  . Diarrhea  . Arm Pain     (Consider location/radiation/quality/duration/timing/severity/associated sxs/prior Treatment) HPI 33 y.o. Female s/p left axillary resection for hydradenitis one week ago.  Patient complaining of left axillary pain, headache and chills.  STates taking clinda and oxycodone.  Diarrhea present multiple times per day since surgery.  No blood or mucous noted.  Patient taking po well and with good uop.  She states left axilla continues to have bloody discharge and area and arm more tender over past two days.  Past Medical History  Diagnosis Date  . Hidradenitis suppurativa     surgical   Past Surgical History  Procedure Laterality Date  . Axillary hidradenitis excision  2011    bilateral  . Irrigation and debridement abscess Left 11/16/2013    Procedure: IRRIGATION AND DEBRIDEMENT LEFT AXILLARY ABSCESS;  Surgeon: Pedro Earls, MD;  Location: [facility];  Service: General;  Laterality: Left;  . Hydradenitis excision Left 02/12/2014    Procedure: EXCISION HIDRADENITIS AXILLA;  Surgeon: Coralie Keens, MD;  Location: [facility];  Service: General;  Laterality: Left;   Family History  Problem Relation Age of Onset  . Asthma Mother   . Diabetes Father   . Asthma Sister   . Asthma Brother   . Cancer Neg Hx   . Heart disease Neg Hx   . Stroke Neg Hx    History  Substance Use Topics  . Smoking status: Never Smoker   . Smokeless tobacco: Never Used  . Alcohol Use: Yes     Comment: drinks monthly or less.   OB History   Grav Para Term Preterm Abortions TAB SAB Ect Mult Living   3 1 1  0 2 2    1      Review of Systems  All other systems reviewed and are negative.     Allergies  Vicodin  Home Medications   Prior to Admission medications   Medication Sig Start  Date End Date Taking? Authorizing Provider  clindamycin (CLEOCIN) 300 MG capsule Take 1 capsule (300 mg total) by mouth 3 (three) times daily. 02/12/14  Yes Coralie Keens, MD  etonogestrel (IMPLANON) 68 MG IMPL implant Inject 1 each into the skin once. [facility]   Yes Historical Provider, MD  oxyCODONE (OXY IR/ROXICODONE) 5 MG immediate release tablet Take 1-2 tablets (5-10 mg total) by mouth every 4 (four) hours as needed for moderate pain, severe pain or breakthrough pain. 02/12/14  Yes Coralie Keens, MD   BP 125/76  Pulse 63  Temp(Src) 98.4 F (36.9 C) (Oral)  Resp 18  SpO2 100% Physical Exam  Nursing note and vitals reviewed. Constitutional: She is oriented to person, place, and time. She appears well-developed and well-nourished.  Obese  HENT:  Head: Normocephalic and atraumatic.  Right Ear: External ear normal.  Left Ear: External ear normal.  Nose: Nose normal.  Mouth/Throat: Oropharynx is clear and moist.  Eyes: Conjunctivae and EOM are normal. Pupils are equal, round, and reactive to light.  Neck: Normal range of motion.  Cardiovascular: Normal rate and regular rhythm.   Pulmonary/Chest: Effort normal and breath sounds normal.  Abdominal: Soft. Bowel sounds are normal.  Musculoskeletal:  Left axillary incision intact with no discharge noted. No erythema or warmth  noted. Left arm without swelling, redness, or fluctuance. Pulses and sensation intact.  Neurological: She is alert and oriented to person, place, and time.  Skin: Skin is warm and dry.  Psychiatric: She has a normal mood and affect. Her behavior is normal.    ED Course  Procedures (including critical care time) Labs Review Labs Reviewed  URINALYSIS, [place] - Abnormal; Notable for the following:    Hgb urine dipstick MODERATE (*)    All other components within normal limits  URINE MICROSCOPIC-ADD ON  CBC WITH DIFFERENTIAL  COMPREHENSIVE METABOLIC  PANEL  POC URINE PREG, ED    Imaging Review Dg Chest 2 View  02/19/2014   CLINICAL DATA:  Chills and diarrhea  EXAM: CHEST  2 VIEW  COMPARISON:  August 25, 2009  FINDINGS: Lungs are clear. Heart size and pulmonary vascularity are normal. No adenopathy. No pneumothorax. No bone lesions.  IMPRESSION: No edema or consolidation.   Electronically Signed   By: Lowella Grip M.D.   On: 02/19/2014 12:49     EKG Interpretation None      MDM   Final diagnoses:  Post-op pain  Diarrhea    33 y.o. Female s/p excision axilla last week presents today complaining of diarrhea, chills, and axilla pain.  Work up here without evidence of infection and appears hemodynamically stable.  Patient voices understanding of return precautions and need for follow up.     Shaune Pollack, MD 02/19/14 [phone]

## 2014-02-20 ENCOUNTER — Telehealth: Payer: Self-pay | Admitting: *Deleted

## 2014-02-20 NOTE — Telephone Encounter (Signed)
[facility] called requesting NPI number.  Pt has appt for nexplanon removal. Derl Barrow, RN

## 2014-04-01 ENCOUNTER — Encounter (HOSPITAL_COMMUNITY): Payer: Self-pay | Admitting: Emergency Medicine

## 2014-05-14 ENCOUNTER — Encounter: Payer: Self-pay | Admitting: Family Medicine

## 2014-05-14 ENCOUNTER — Ambulatory Visit (INDEPENDENT_AMBULATORY_CARE_PROVIDER_SITE_OTHER): Payer: Medicaid Other | Admitting: Family Medicine

## 2014-05-14 ENCOUNTER — Other Ambulatory Visit (HOSPITAL_COMMUNITY)
Admission: RE | Admit: 2014-05-14 | Discharge: 2014-05-14 | Disposition: A | Discharge status: Home / Self Care | Payer: Medicaid Other | Source: Ambulatory Visit | Attending: Family Medicine | Admitting: Family Medicine

## 2014-05-14 VITALS — BP 118/78 | HR 66 | Temp 98.5°F | Ht 63.0 in | Wt 189.0 lb

## 2014-05-14 DIAGNOSIS — Z113 Encounter for screening for infections with a predominantly sexual mode of transmission: Secondary | ICD-10-CM

## 2014-05-14 LAB — POCT URINE PREGNANCY: Preg Test, Ur: NEGATIVE

## 2014-05-14 NOTE — Patient Instructions (Signed)
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
## 2014-05-15 LAB — URINE CYTOLOGY ANCILLARY ONLY
Chlamydia: NEGATIVE
Neisseria Gonorrhea: NEGATIVE

## 2014-05-15 LAB — RPR

## 2014-05-15 LAB — HIV ANTIBODY (ROUTINE TESTING W REFLEX): HIV: NONREACTIVE

## 2014-05-16 ENCOUNTER — Telehealth: Payer: Self-pay | Admitting: Family Medicine

## 2014-05-16 NOTE — Telephone Encounter (Signed)
Discussed negative STD results with pt on phone.  No further questions.  Thanks [employer]. Awanda Mink, DO of [facility] 05/16/2014, 9:48 AM

## 2014-06-06 ENCOUNTER — Encounter: Payer: Medicaid Other | Admitting: Family Medicine

## 2014-07-08 ENCOUNTER — Ambulatory Visit (INDEPENDENT_AMBULATORY_CARE_PROVIDER_SITE_OTHER): Payer: Medicaid Other | Admitting: Family Medicine

## 2014-07-08 ENCOUNTER — Encounter: Payer: Self-pay | Admitting: Family Medicine

## 2014-07-08 VITALS — BP 114/75 | HR 71 | Temp 98.5°F | Ht 63.0 in | Wt 190.0 lb

## 2014-07-08 DIAGNOSIS — R358 Other polyuria: Secondary | ICD-10-CM

## 2014-07-08 DIAGNOSIS — R3589 Other polyuria: Secondary | ICD-10-CM

## 2014-07-08 DIAGNOSIS — R1030 Lower abdominal pain, unspecified: Secondary | ICD-10-CM

## 2014-07-08 DIAGNOSIS — R109 Unspecified abdominal pain: Secondary | ICD-10-CM | POA: Insufficient documentation

## 2014-07-08 LAB — POCT URINALYSIS DIPSTICK
Bilirubin, UA: NEGATIVE
GLUCOSE UA: NEGATIVE
KETONES UA: NEGATIVE
Leukocytes, UA: NEGATIVE
Nitrite, UA: NEGATIVE
PH UA: 7.5
Protein, UA: NEGATIVE
Spec Grav, UA: 1.02
Urobilinogen, UA: 0.2

## 2014-07-08 LAB — POCT UA - MICROSCOPIC ONLY: RBC, urine, microscopic: >20

## 2014-07-08 LAB — POCT URINE PREGNANCY: PREG TEST UR: NEGATIVE

## 2014-07-08 NOTE — Progress Notes (Signed)
    Subjective   Victoria Rodriguez is a 34 y.o. female that presents for a same day visit  Ab pain: it has been  Present for 1.5 weeks. It is sharp in nature. She has taken tylenol for it. Nothing seems to help or make the pain worse. She recently had her implanon removed because it was about to expire. She had a cycle the beginning of last month. She has had unprotected intercourse with associated polyuria. No fever, chills, nausea, vomiting, diarrhea, rashes, or joint pain. Did endorse some constipation but had a normal bowel movement for her today. She normally has one bowel movement a day. She denies any vaginal discharge or dysuria. No history of a previous gonorrhea/chlamydia infection. Doesn't appear to be related to food. Only take 1-2 tablets of motrin per week for her hidradenitis. She is not stressed and she denies that it is related to anxiety.    History  Substance Use Topics  . Smoking status: Never Smoker   . Smokeless tobacco: Never Used  . Alcohol Use: Yes     Comment: drinks monthly or less.    ROS Per HPI  Objective   BP 114/75 mmHg  Pulse 71  Temp(Src) 98.5 F (36.9 C) (Oral)  Ht 5\' 3"  (1.6 m)  Wt 190 lb (86.183 kg)  BMI 33.67 kg/m2  LMP   General: well appearing, NAD, alert  Cardiovascular: regular rate  Gastrointestinal: soft, NTND, +BS, no rebound or guarding, no HSM, neg psoas sign  Extremities: moves all freely    Assessment and Plan   Please refer to problem based charting of assessment and plan

## 2014-07-08 NOTE — Addendum Note (Signed)
Addended by: [place], Judiann Celia on: 07/08/2014 05:48 PM   Modules accepted: Orders

## 2014-07-08 NOTE — Patient Instructions (Signed)
Thank you for coming in,   Your ab pain may be related to your cycle since you have just had the implanon removed. I would take motrin as needed.   Once you cycle is a little more regular, try to see if the ab pain is related to that.    Please feel free to call with any questions or concerns at any time, at [phone]. --Dr. Raeford Razor

## 2014-07-08 NOTE — Assessment & Plan Note (Signed)
Pain is most likely related to her cycle.  Could be a viral process but no vomiting, nausea or diarrhea. Her implanon was taken out since it was about to expire. She did endorse unprotected sex and polyuria but pregnancy test was negative. No vaginal discharge. No hx gonorrhea/ch. No suggestive of appendicitis or cholecystitis. No excessive NSAID use.  - upreg: neg - urine dipstick and culture pending - if neg, may need a pelvic exam, wet prep, gc/ch

## 2014-07-09 LAB — URINE CULTURE
Colony Count: NO GROWTH
Organism ID, Bacteria: NO GROWTH

## 2014-07-11 ENCOUNTER — Telehealth: Payer: Self-pay | Admitting: Family Medicine

## 2014-07-11 NOTE — Telephone Encounter (Signed)
Informed patient of Urine cx and UA results.   Rosemarie Ax, MD PGY-2, [facility] 07/11/2014, 10:01 AM

## 2014-08-14 ENCOUNTER — Emergency Department (HOSPITAL_COMMUNITY)
Admission: EM | Admit: 2014-08-14 | Discharge: 2014-08-14 | Disposition: A | Discharge status: Home / Self Care | Payer: Medicaid Other | Attending: Emergency Medicine | Admitting: Emergency Medicine

## 2014-08-14 ENCOUNTER — Encounter (HOSPITAL_COMMUNITY): Payer: Self-pay | Admitting: Family Medicine

## 2014-08-14 DIAGNOSIS — Y9389 Activity, other specified: Secondary | ICD-10-CM | POA: Insufficient documentation

## 2014-08-14 DIAGNOSIS — Z793 Long term (current) use of hormonal contraceptives: Secondary | ICD-10-CM | POA: Insufficient documentation

## 2014-08-14 DIAGNOSIS — M545 Low back pain, unspecified: Secondary | ICD-10-CM

## 2014-08-14 DIAGNOSIS — S8991XA Unspecified injury of right lower leg, initial encounter: Secondary | ICD-10-CM | POA: Diagnosis not present

## 2014-08-14 DIAGNOSIS — Y9289 Other specified places as the place of occurrence of the external cause: Secondary | ICD-10-CM | POA: Insufficient documentation

## 2014-08-14 DIAGNOSIS — Z872 Personal history of diseases of the skin and subcutaneous tissue: Secondary | ICD-10-CM | POA: Insufficient documentation

## 2014-08-14 DIAGNOSIS — Y998 Other external cause status: Secondary | ICD-10-CM | POA: Insufficient documentation

## 2014-08-14 DIAGNOSIS — M25561 Pain in right knee: Secondary | ICD-10-CM

## 2014-08-14 DIAGNOSIS — S3992XA Unspecified injury of lower back, initial encounter: Secondary | ICD-10-CM | POA: Insufficient documentation

## 2014-08-14 MED ORDER — CYCLOBENZAPRINE HCL 10 MG PO TABS: 10.0000 mg | ORAL_TABLET | Freq: Three times a day (TID) | ORAL | Status: DC | PRN

## 2014-08-14 MED ORDER — IBUPROFEN 800 MG PO TABS: 800.0000 mg | ORAL_TABLET | Freq: Three times a day (TID) | ORAL | Status: DC | PRN
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
## 2014-08-16 ENCOUNTER — Encounter (HOSPITAL_COMMUNITY): Payer: Self-pay | Admitting: Emergency Medicine

## 2014-08-16 ENCOUNTER — Emergency Department (HOSPITAL_COMMUNITY)
Admission: EM | Admit: 2014-08-16 | Discharge: 2014-08-16 | Discharge status: Against Medical Advice | Payer: Medicaid Other | Attending: Emergency Medicine | Admitting: Emergency Medicine

## 2014-08-16 DIAGNOSIS — Z3202 Encounter for pregnancy test, result negative: Secondary | ICD-10-CM | POA: Insufficient documentation

## 2014-08-16 DIAGNOSIS — M546 Pain in thoracic spine: Secondary | ICD-10-CM | POA: Insufficient documentation

## 2014-08-16 DIAGNOSIS — Z872 Personal history of diseases of the skin and subcutaneous tissue: Secondary | ICD-10-CM | POA: Insufficient documentation

## 2014-08-16 LAB — URINALYSIS, ROUTINE W REFLEX MICROSCOPIC
Bilirubin Urine: NEGATIVE
Glucose, UA: NEGATIVE mg/dL
Ketones, ur: NEGATIVE mg/dL
LEUKOCYTES UA: NEGATIVE
NITRITE: NEGATIVE
Protein, ur: NEGATIVE mg/dL
Specific Gravity, Urine: 1.027 (ref 1.005–1.030)
Urobilinogen, UA: 0.2 mg/dL (ref 0.0–1.0)
pH: 5.5 (ref 5.0–8.0)

## 2014-08-16 LAB — URINE MICROSCOPIC-ADD ON

## 2014-08-16 LAB — POC URINE PREG, ED: PREG TEST UR: NEGATIVE

## 2014-08-16 MED ORDER — DIAZEPAM 5 MG PO TABS
5.0000 mg | ORAL_TABLET | Freq: Once | ORAL | Status: DC
  Filled 2014-08-16: qty 1

## 2014-08-16 MED ORDER — KETOROLAC TROMETHAMINE 60 MG/2ML IM SOLN
60.0000 mg | Freq: Once | INTRAMUSCULAR | Status: DC
  Filled 2014-08-16: qty 2

## 2014-08-16 NOTE — ED Notes (Signed)
Went to room to give medications to pt, pt not in room. Blood pressure cuff and gown found on bed. Pt not in bathroom.

## 2014-08-16 NOTE — ED Notes (Signed)
Pt states she was given ibuprofen and "muscle relaxer's" when she was here Wednesday, "but they didn't help"
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
## 2014-08-16 NOTE — ED Notes (Signed)
Pt still has not returned to room.
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
## 2014-10-17 ENCOUNTER — Other Ambulatory Visit (HOSPITAL_COMMUNITY)
Admission: RE | Admit: 2014-10-17 | Discharge: 2014-10-17 | Disposition: A | Discharge status: Home / Self Care | Payer: Medicaid Other | Source: Ambulatory Visit | Attending: Family Medicine | Admitting: Family Medicine

## 2014-10-17 ENCOUNTER — Encounter: Payer: Self-pay | Admitting: Family Medicine

## 2014-10-17 ENCOUNTER — Other Ambulatory Visit: Payer: Self-pay | Admitting: Family Medicine

## 2014-10-17 ENCOUNTER — Ambulatory Visit (INDEPENDENT_AMBULATORY_CARE_PROVIDER_SITE_OTHER): Payer: Medicaid Other | Admitting: Family Medicine

## 2014-10-17 VITALS — BP 127/81 | HR 72 | Temp 98.4°F | Ht 63.0 in | Wt 185.2 lb

## 2014-10-17 DIAGNOSIS — Z01419 Encounter for gynecological examination (general) (routine) without abnormal findings: Secondary | ICD-10-CM | POA: Diagnosis not present

## 2014-10-17 DIAGNOSIS — Z0000 Encounter for general adult medical examination without abnormal findings: Secondary | ICD-10-CM | POA: Diagnosis not present

## 2014-10-17 DIAGNOSIS — N898 Other specified noninflammatory disorders of vagina: Secondary | ICD-10-CM

## 2014-10-17 DIAGNOSIS — Z113 Encounter for screening for infections with a predominantly sexual mode of transmission: Secondary | ICD-10-CM | POA: Insufficient documentation

## 2014-10-17 DIAGNOSIS — Z124 Encounter for screening for malignant neoplasm of cervix: Secondary | ICD-10-CM

## 2014-10-17 DIAGNOSIS — L732 Hidradenitis suppurativa: Secondary | ICD-10-CM

## 2014-10-17 DIAGNOSIS — Z202 Contact with and (suspected) exposure to infections with a predominantly sexual mode of transmission: Secondary | ICD-10-CM | POA: Diagnosis not present

## 2014-10-17 DIAGNOSIS — Z20828 Contact with and (suspected) exposure to other viral communicable diseases: Secondary | ICD-10-CM

## 2014-10-17 DIAGNOSIS — Z131 Encounter for screening for diabetes mellitus: Secondary | ICD-10-CM | POA: Diagnosis not present

## 2014-10-17 LAB — HIV ANTIBODY (ROUTINE TESTING W REFLEX): HIV 1&2 Ab, 4th Generation: NONREACTIVE

## 2014-10-17 LAB — BASIC METABOLIC PANEL
BUN: 8 mg/dL (ref 6–23)
CHLORIDE: 102 meq/L (ref 96–112)
CO2: 24 mEq/L (ref 19–32)
Calcium: 9.7 mg/dL (ref 8.4–10.5)
Creat: 0.67 mg/dL (ref 0.50–1.10)
Glucose, Bld: 75 mg/dL (ref 70–99)
Potassium: 4 mEq/L (ref 3.5–5.3)
Sodium: 138 mEq/L (ref 135–145)

## 2014-10-17 LAB — POCT URINE PREGNANCY: Preg Test, Ur: NEGATIVE

## 2014-10-17 MED ORDER — CLINDAMYCIN HCL 150 MG PO CAPS: 150.0000 mg | ORAL_CAPSULE | Freq: Four times a day (QID) | ORAL | Status: AC

## 2014-10-17 NOTE — Patient Instructions (Signed)
Dr Skarlette Lattner will call you if your Pap smear are abnormal. Otherwise he will send you a letter.  If you sign up for MyChart online, you will be able to see your test results once Dr Mattalyn Anderegg has reviewed them.  If you do not hear from [place] with in 2 weeks please call our office.  I believe your skin sores under your breasts and in your groin are the same thing as the problem of your skin in your armpits, Hidradenitis Suppurativa.  There is a small area in the left side of the groin with a developing sore that we may be able to heal with skin treatment and Clindamycin by mouth.   Please take the Clindamycin tablet, one tablet four times a day for 10 day course. Please apply triple Antibiotic ointment to the sore area in your groin three times a day. If you can, Sitz baths three to four times a day will help heal the sore. Silas Flood Bath A sitz bath is a warm water bath taken in the sitting position. The water covers only the hips and butt (buttocks). It may be used for either healing or cleaning purposes. Sitz baths are also used to relieve pain, itching, or muscle tightening (spasms). The water may contain medicine. Moist heat will help you heal and relax.  HOME CARE  Take 3 to 4 sitz baths a day. 1. Fill the bathtub half-full with warm water. 2. Sit in the water and open the drain a little. 3. Turn on the warm water to keep the tub half-full. Keep the water running constantly. 4. Soak in the water for 15 to 20 minutes. 5. After the sitz bath, pat the affected area dry. GET HELP RIGHT AWAY IF: You get worse instead of better. Stop the sitz baths if you get worse. MAKE SURE YOU:  Understand these instructions.  Will watch your condition.  Will get help right away if you are not doing well or get worse. Document Released: 06/24/2004 Document Revised: 02/09/2012 Document Reviewed: 09/14/2010 [facility] Patient Information 2015 [place], [place]. This information is not intended to replace  advice given to you by your health care provider. Make sure you discuss any questions you have with your health care provider.

## 2014-10-18 ENCOUNTER — Encounter: Payer: Self-pay | Admitting: Family Medicine

## 2014-10-18 LAB — CERVICOVAGINAL ANCILLARY ONLY
Chlamydia: NEGATIVE
NEISSERIA GONORRHEA: NEGATIVE

## 2014-10-18 LAB — RPR

## 2014-10-18 NOTE — Progress Notes (Signed)
Subjective:    Patient ID: Victoria Rodriguez, female    DOB: 05/10/1981, 34 y.o.   MRN: [id number]  HPI    Review of Systems     Objective:   Physical Exam        Assessment & Plan:   Subjective:     Victoria Rodriguez is a 34 y.o. female and is here for a comprehensive physical exam. The patient reports problems - molimna. Well Woman Visit:  Menstrual history General  Prior history of menstrual irregularity: no   Prior history of heavy or intermenstrual bleeding: no  Prior history of dysmenorrhea:no    Women of reproductive age and in the menopausal transition Current or recent heavy or intermenstrual bleeding: no  Current or recent postcoital bleeding :no  Current or recent dysmenorrhea: no Presence of moliminal or premenstrual symptoms: yes, breast tenderness    Obstetrical history  History of any pregnancies: yes 1  Current health of children: 1   Sexual Function Patient's sexual concerns: no Currently having or have you ever had sexual relations: yes  Sexual partners are men or women or both?: Men only   New partners or sexual contacts? How many partners in last 2 months: 1; In the past 12 months: 1 Non-monogamous sex partner in the past 12 months: uncertain Would patient like to be screened for sexually transmitted diseases?: yes Is patient currently experiencing or have experienced sexual abuse?: no   Contraception Type of contraception, past and current: none  Genital Infections Current symptoms or history of pelvic, vaginal, or vulvar infections Vaginal discharge: no Vulvar or vaginal lesions: no Fever: no Pelvic pain: no Abnormal genital tract bleeding:no  Cervical Dysplasia or Cancer History Cervical cytology (Pap test) history Date and result of last test: 2014, transition zone absent o/w no evidence of dysplasia    History of other gynecologic problems  Urinary incontinence with standing, lifting, sneezing or  laughing:no Urinary Frequency: no Sudden urge to urinate: no;     History   Social History  . Marital Status: Single    Spouse Name: N/A  . Number of Children: 1  . Years of Education: HS   Occupational History  . Not on file.   Social History Main Topics  . Smoking status: Never Smoker   . Smokeless tobacco: Never Used  . Alcohol Use: Yes     Comment: drinks monthly or less.  . Drug Use: No  . Sexual Activity: Yes    Birth Control/ Protection: None   Other Topics Concern  . Not on file   Social History Narrative   Lives with 27 yo son.  Unemployed. Not in a relationship.   Homemaker   Health Maintenance  Topic Date Due  . INFLUENZA VACCINE  12/30/2014  . PAP SMEAR  06/09/2015  . TETANUS/TDAP  02/28/2022  . HIV Screening  Completed    The following portions of the patient's history were reviewed and updated as appropriate: allergies, current medications, past family history, past medical history, past social history, past surgical history and problem list.  Review of Systems Constitutional: negative for chills and fevers Genitourinary:negative for dysuria, frequency and urinary incontinence   Objective:   Gen: NAD VS: Reviewed   Genitalia:  Normal introitus for age, no external lesions, no vaginal discharge, mucosa pink and moist, no vaginal or cervical lesions, no vaginal atrophy, no friaility or hemorrhage, normal uterus size and position, no adnexal masses or tenderness Pap obtained Left perineal-labia majora location with tender  eyrthematous papule without drainage without warmth with adjacent scar tissue.  Submammary skin with evidence of healed scar tissue in left region. No palpable tenderness or firmness.     Assessment:    Healthy female exam.      Plan:     See After Visit Summary for Counseling Recommendations

## 2014-10-18 NOTE — Assessment & Plan Note (Signed)
New lesion left perinea-labia majora region restrt Clinda 150 QID x 10 d Sitz baths If worsens, return to clinic.

## 2014-10-30 ENCOUNTER — Encounter (HOSPITAL_COMMUNITY): Payer: Self-pay | Admitting: Emergency Medicine

## 2014-10-30 ENCOUNTER — Emergency Department (HOSPITAL_COMMUNITY): Payer: Medicaid Other

## 2014-10-30 ENCOUNTER — Emergency Department (HOSPITAL_COMMUNITY)
Admission: EM | Admit: 2014-10-30 | Discharge: 2014-10-30 | Disposition: A | Discharge status: Home / Self Care | Payer: Medicaid Other | Attending: Emergency Medicine | Admitting: Emergency Medicine

## 2014-10-30 DIAGNOSIS — Y998 Other external cause status: Secondary | ICD-10-CM | POA: Insufficient documentation

## 2014-10-30 DIAGNOSIS — Y9389 Activity, other specified: Secondary | ICD-10-CM | POA: Insufficient documentation

## 2014-10-30 DIAGNOSIS — Z872 Personal history of diseases of the skin and subcutaneous tissue: Secondary | ICD-10-CM | POA: Diagnosis not present

## 2014-10-30 DIAGNOSIS — S3992XA Unspecified injury of lower back, initial encounter: Secondary | ICD-10-CM | POA: Diagnosis not present

## 2014-10-30 DIAGNOSIS — M545 Low back pain: Secondary | ICD-10-CM

## 2014-10-30 DIAGNOSIS — S0990XA Unspecified injury of head, initial encounter: Secondary | ICD-10-CM | POA: Insufficient documentation

## 2014-10-30 DIAGNOSIS — Y92410 Unspecified street and highway as the place of occurrence of the external cause: Secondary | ICD-10-CM | POA: Diagnosis not present

## 2014-10-30 DIAGNOSIS — S161XXA Strain of muscle, fascia and tendon at neck level, initial encounter: Secondary | ICD-10-CM | POA: Insufficient documentation

## 2014-10-30 DIAGNOSIS — M5440 Lumbago with sciatica, unspecified side: Secondary | ICD-10-CM | POA: Insufficient documentation

## 2014-10-30 DIAGNOSIS — S199XXA Unspecified injury of neck, initial encounter: Secondary | ICD-10-CM | POA: Diagnosis present

## 2014-10-30 MED ORDER — CYCLOBENZAPRINE HCL 10 MG PO TABS: 10.0000 mg | ORAL_TABLET | Freq: Two times a day (BID) | ORAL | Status: DC | PRN

## 2014-10-30 MED ORDER — IBUPROFEN 800 MG PO TABS: 800.0000 mg | ORAL_TABLET | Freq: Three times a day (TID) | ORAL | Status: DC
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
## 2014-10-30 NOTE — ED Notes (Signed)
PT reports she needs to leave to pick up son. Pt reports will come back.

## 2014-10-30 NOTE — ED Notes (Signed)
Patient states needs to go to pick up child at school.   PA advised.

## 2014-10-30 NOTE — ED Notes (Signed)
Pt left before D/C instructions were printed

## 2014-10-30 NOTE — ED Notes (Signed)
Went to room to discharge patient.  Patient left.   Patient did not receive discharge instructions.
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
## 2014-11-01 ENCOUNTER — Ambulatory Visit: Payer: Medicaid Other | Admitting: Family Medicine

## 2014-12-27 ENCOUNTER — Other Ambulatory Visit: Payer: Self-pay | Admitting: Surgery

## 2015-01-01 ENCOUNTER — Ambulatory Visit (INDEPENDENT_AMBULATORY_CARE_PROVIDER_SITE_OTHER): Payer: Medicaid Other | Admitting: Family Medicine

## 2015-01-01 ENCOUNTER — Encounter: Payer: Self-pay | Admitting: Family Medicine

## 2015-01-01 VITALS — BP 116/76 | HR 72 | Temp 98.6°F | Ht 63.0 in | Wt 184.0 lb

## 2015-01-01 DIAGNOSIS — Z331 Pregnant state, incidental: Secondary | ICD-10-CM

## 2015-01-01 DIAGNOSIS — L732 Hidradenitis suppurativa: Secondary | ICD-10-CM

## 2015-01-01 DIAGNOSIS — N926 Irregular menstruation, unspecified: Secondary | ICD-10-CM | POA: Diagnosis not present

## 2015-01-01 DIAGNOSIS — R1030 Lower abdominal pain, unspecified: Secondary | ICD-10-CM

## 2015-01-01 DIAGNOSIS — Z3490 Encounter for supervision of normal pregnancy, unspecified, unspecified trimester: Secondary | ICD-10-CM | POA: Insufficient documentation

## 2015-01-01 LAB — POCT URINALYSIS DIPSTICK
BILIRUBIN UA: NEGATIVE
GLUCOSE UA: NEGATIVE
Ketones, UA: NEGATIVE
Leukocytes, UA: NEGATIVE
NITRITE UA: NEGATIVE
PH UA: 6.5
Protein, UA: NEGATIVE
Spec Grav, UA: 1.025
Urobilinogen, UA: 0.2

## 2015-01-01 LAB — POCT UA - MICROSCOPIC ONLY

## 2015-01-01 LAB — POCT URINE PREGNANCY: PREG TEST UR: POSITIVE — AB

## 2015-01-01 NOTE — Progress Notes (Signed)
   Subjective:    Patient ID: Victoria Rodriguez, female    DOB: [birth year], 34 y.o.   MRN: [id number]  Patient presents for a same day appointment.  HPI  LOWER ABDOMINAL / BACK PAIN: - Reports symptoms started 1.5 weeks, intermittent episodes and alternating with abd vs back pain, not always both hurting at same time. Lasting < 1 hour. Currently active lower abdominal pain, described as "mild" mostly "cramping". Improved symptoms eating mildly for abd pain. No change with BMs, regular once daily. - Previously seen in 07/2014 for similar abdominal pain, thought to be related to dysmenorrhea at that time. - LMP 11/24/14, admits to being approx >1 week late on her period - Sexually active, one partner, no condom use, believes there may be a chance she is pregnant - Admit mild nausea (no vomiting), urinary frequency - Denies fevers/chills, diarrhea, constipation, edema, dysuria, hematuria, urinary odor, vaginal discharge  PMH: - Chronic Hidradrenitis Suppurativa, followed by General Surgery ([facility]), currently Right axilla, was recently placed on Clindamycin and planning upcoming surgery for I&D  I have reviewed and updated the following as appropriate: allergies and current medications  Social Hx: - Never smoker  Review of Systems  See above HPI    Objective:   Physical Exam  BP 116/76 mmHg  Pulse 72  Temp(Src) 98.6 F (37 C) (Oral)  Ht 5\' 3"  (1.6 m)  Wt 184 lb (83.462 kg)  BMI 32.60 kg/m2  LMP 11/24/2014  Gen - well-appearing, comfortable, NAD HEENT - oropharynx clear, MMM Heart - RRR, no murmurs heard Abd - soft, mild suprapubic tenderness otherwise NTND, non-gravid appearing abdomen, no masses, +active BS Ext - non-tender, no edema, peripheral pulses intact +2 b/l Skin - warm, dry, no rashes. Right axilla with multiple small chronic appearing pustules without extending erythema or drainage. Neuro - awake, alert     Assessment & Plan:   See specific A&P problem list for  details.

## 2015-01-01 NOTE — Patient Instructions (Addendum)
Dear Victoria Rodriguez, Thank you for coming in to clinic today. It was good to see you!  1. Urine Pregnancy test is positive.  - Urine test does not appear infected, but showed moderate microscopic blood, testing further to rule out UTI. 2. Possible that your symptoms are due to pregnancy, additionally, given that this is approximately the normal time for your period, most likely we would classify this as "Dysmenorrhea" or painful cramping periods, it is extremely common. 3. May take Tylenol as needed, up to 2 extra strength tabs every 8 hours or 3 times a day as needed  4. Stay well hydrated, try to eat regular, don't skip meals.  Please schedule a follow-up appointment for initial Prenatal Care.  If you have any other questions or concerns, please feel free to call the clinic to contact me. You may also schedule an earlier appointment if necessary.  However, if your symptoms get significantly worse, please go to the Emergency Department to seek immediate medical attention.  [name], [place]

## 2015-01-01 NOTE — Assessment & Plan Note (Addendum)
Mild intermittent lower abdominal pain / back pain, presentation consistent with dysmenorrhea q menstrual cycle, no other red flags. Now confirmed early pregnancy with LMP 11/24/2014 as likely contribution to her symptoms. - UA negative, no UTI - No evidence for infection, benign abdomen, clinically well appearing. Low suspicion for ectopic, no risk factors and currently seems benign discomfort  Plan: 1. Urine pregnancy positive (01/01/15). Start PNV. Discontinue NSAIDs. Can take Tylenol. 2. Recommend may discontinue clindamycin if taking more for prophylaxis, and not active infection, follow-up with General Surgery about upcoming I&D, may be able to do local anesthesia 3. RTC for initial prenatal care visit 4. Return criteria given, if pain significantly worsening or changing, would consider re-evaluation, [facility] as needed, can trend beta-hcg and would check pelvic US to confirm IUP

## 2015-01-01 NOTE — Assessment & Plan Note (Signed)
Recommend may discontinue clindamycin if taking more for prophylaxis, and not active infection, follow-up with General Surgery about upcoming I&D, may be able to do local anesthesia

## 2015-01-01 NOTE — Assessment & Plan Note (Signed)
Confirmed early pregnancy with LMP 11/24/2014 as likely contribution to her symptoms. - Clinically well appearing. Low suspicion for ectopic, no risk factors and currently seems benign discomfort  Plan: 1. Urine pregnancy positive (01/01/15). Start PNV. Discontinue NSAIDs. Can take Tylenol 2. Recommend may discontinue clindamycin if taking more for prophylaxis, and not active infection, follow-up with General Surgery about upcoming I&D, may be able to do local anesthesia 3. RTC for initial prenatal care visit 4. Return criteria given, if pain significantly worsening or changing, would consider re-evaluation, [facility]-evaluation as needed, can trend beta-hcg and would check pelvic US to confirm IUP

## 2015-01-03 ENCOUNTER — Inpatient Hospital Stay (HOSPITAL_COMMUNITY)
Admission: EM | Admit: 2015-01-03 | Discharge: 2015-01-03 | Discharge status: Against Medical Advice | Payer: Medicaid Other | Source: Ambulatory Visit | Attending: Obstetrics & Gynecology | Admitting: Obstetrics & Gynecology

## 2015-01-03 NOTE — MAU Note (Signed)
Pt not in lobby when called

## 2015-01-06 ENCOUNTER — Inpatient Hospital Stay (HOSPITAL_COMMUNITY)
Admission: AD | Admit: 2015-01-06 | Discharge: 2015-01-06 | Disposition: A | Discharge status: Home / Self Care | Payer: Medicaid Other | Source: Ambulatory Visit | Attending: Family Medicine | Admitting: Family Medicine

## 2015-01-06 ENCOUNTER — Inpatient Hospital Stay (HOSPITAL_COMMUNITY): Payer: Medicaid Other

## 2015-01-06 ENCOUNTER — Encounter (HOSPITAL_COMMUNITY): Payer: Self-pay

## 2015-01-06 DIAGNOSIS — O99611 Diseases of the digestive system complicating pregnancy, first trimester: Secondary | ICD-10-CM | POA: Insufficient documentation

## 2015-01-06 DIAGNOSIS — O9989 Other specified diseases and conditions complicating pregnancy, childbirth and the puerperium: Secondary | ICD-10-CM

## 2015-01-06 DIAGNOSIS — O26899 Other specified pregnancy related conditions, unspecified trimester: Secondary | ICD-10-CM

## 2015-01-06 DIAGNOSIS — R109 Unspecified abdominal pain: Secondary | ICD-10-CM

## 2015-01-06 DIAGNOSIS — Z3A01 Less than 8 weeks gestation of pregnancy: Secondary | ICD-10-CM

## 2015-01-06 DIAGNOSIS — R1032 Left lower quadrant pain: Secondary | ICD-10-CM | POA: Diagnosis present

## 2015-01-06 DIAGNOSIS — A084 Viral intestinal infection, unspecified: Secondary | ICD-10-CM | POA: Diagnosis not present

## 2015-01-06 DIAGNOSIS — Z885 Allergy status to narcotic agent status: Secondary | ICD-10-CM | POA: Insufficient documentation

## 2015-01-06 LAB — HCG, QUANTITATIVE, PREGNANCY: hCG, Beta Chain, Quant, S: 12657 m[IU]/mL — ABNORMAL HIGH (ref <5)

## 2015-01-06 LAB — CBC
HCT: 34.7 % — ABNORMAL LOW (ref 36.0–46.0)
Hemoglobin: 11.2 g/dL — ABNORMAL LOW (ref 12.0–15.0)
MCH: 27.7 pg (ref 26.0–34.0)
MCHC: 32.3 g/dL (ref 30.0–36.0)
MCV: 85.9 fL (ref 78.0–100.0)
Platelets: 292 10*3/uL (ref 150–400)
RBC: 4.04 MIL/uL (ref 3.87–5.11)
RDW: 14.4 % (ref 11.5–15.5)
WBC: 5.6 10*3/uL (ref 4.0–10.5)

## 2015-01-06 LAB — WET PREP, GENITAL
Trich, Wet Prep: NONE SEEN
WBC WET PREP: NONE SEEN
Yeast Wet Prep HPF POC: NONE SEEN

## 2015-01-06 NOTE — Discharge Instructions (Signed)
Viral Gastroenteritis Viral gastroenteritis is also known as stomach flu. This condition affects the stomach and intestinal tract. It can cause sudden diarrhea and vomiting. The illness typically lasts 3 to 8 days. Most people develop an immune response that eventually gets rid of the virus. While this natural response develops, the virus can make you quite ill. CAUSES  Many different viruses can cause gastroenteritis, such as rotavirus or noroviruses. You can catch one of these viruses by consuming contaminated food or water. You may also catch a virus by sharing utensils or other personal items with an infected person or by touching a contaminated surface. SYMPTOMS  The most common symptoms are diarrhea and vomiting. These problems can cause a severe loss of body fluids (dehydration) and a body salt (electrolyte) imbalance. Other symptoms may include:  Fever.  Headache.  Fatigue.  Abdominal pain. DIAGNOSIS  Your caregiver can usually diagnose viral gastroenteritis based on your symptoms and a physical exam. A stool sample may also be taken to test for the presence of viruses or other infections. TREATMENT  This illness typically goes away on its own. Treatments are aimed at rehydration. The most serious cases of viral gastroenteritis involve vomiting so severely that you are not able to keep fluids down. In these cases, fluids must be given through an intravenous line (IV). HOME CARE INSTRUCTIONS   Drink enough fluids to keep your urine clear or pale yellow. Drink small amounts of fluids frequently and increase the amounts as tolerated.  Ask your caregiver for specific rehydration instructions.  Avoid:  Foods high in sugar.  Alcohol.  Carbonated drinks.  Tobacco.  Juice.  Caffeine drinks.  Extremely hot or cold fluids.  Fatty, greasy foods.  Too much intake of anything at one time.  Dairy products until 24 to 48 hours after diarrhea stops.  You may consume probiotics.  Probiotics are active cultures of beneficial bacteria. They may lessen the amount and number of diarrheal stools in adults. Probiotics can be found in yogurt with active cultures and in supplements.  Wash your hands well to avoid spreading the virus.  Only take over-the-counter or prescription medicines for pain, discomfort, or fever as directed by your caregiver. Do not give aspirin to children. Antidiarrheal medicines are not recommended.  Ask your caregiver if you should continue to take your regular prescribed and over-the-counter medicines.  Keep all follow-up appointments as directed by your caregiver. SEEK IMMEDIATE MEDICAL CARE IF:   You are unable to keep fluids down.  You do not urinate at least once every 6 to 8 hours.  You develop shortness of breath.  You notice blood in your stool or vomit. This may look like coffee grounds.  You have abdominal pain that increases or is concentrated in one small area (localized).  You have persistent vomiting or diarrhea.  You have a fever.  The patient is a child younger than 3 months, and he or she has a fever.  The patient is a child older than 3 months, and he or she has a fever and persistent symptoms.  The patient is a child older than 3 months, and he or she has a fever and symptoms suddenly get worse.  The patient is a baby, and he or she has no tears when crying. MAKE SURE YOU:   Understand these instructions.  Will watch your condition.  Will get help right away if you are not doing well or get worse. Document Released: 05/17/2005 Document Revised: 08/09/2011 Document Reviewed: 03/03/2011  [employer] Patient Information 2015 [employer]. This information is not intended to replace advice given to you by your health care provider. Make sure you discuss any questions you have with your health care provider. First Trimester of Pregnancy The first trimester of pregnancy is from week 1 until the end of week 12 (months 1  through 3). A week after a sperm fertilizes an egg, the egg will implant on the wall of the uterus. This embryo will begin to develop into a baby. Genes from you and your partner are forming the baby. The female genes determine whether the baby is a boy or a girl. At 6-8 weeks, the eyes and face are formed, and the heartbeat can be seen on ultrasound. At the end of 12 weeks, all the baby's organs are formed.  Now that you are pregnant, you will want to do everything you can to have a healthy baby. Two of the most important things are to get good prenatal care and to follow your health care provider's instructions. Prenatal care is all the medical care you receive before the baby's birth. This care will help prevent, find, and treat any problems during the pregnancy and childbirth. BODY CHANGES Your body goes through many changes during pregnancy. The changes vary from woman to woman.   You may gain or lose a couple of pounds at first.  You may feel sick to your stomach (nauseous) and throw up (vomit). If the vomiting is uncontrollable, call your health care provider.  You may tire easily.  You may develop headaches that can be relieved by medicines approved by your health care provider.  You may urinate more often. Painful urination may mean you have a bladder infection.  You may develop heartburn as a result of your pregnancy.  You may develop constipation because certain hormones are causing the muscles that push waste through your intestines to slow down.  You may develop hemorrhoids or swollen, bulging veins (varicose veins).  Your breasts may begin to grow larger and become tender. Your nipples may stick out more, and the tissue that surrounds them (areola) may become darker.  Your gums may bleed and may be sensitive to brushing and flossing.  Dark spots or blotches (chloasma, mask of pregnancy) may develop on your face. This will likely fade after the baby is born.  Your menstrual  periods will stop.  You may have a loss of appetite.  You may develop cravings for certain kinds of food.  You may have changes in your emotions from day to day, such as being excited to be pregnant or being concerned that something may go wrong with the pregnancy and baby.  You may have more vivid and strange dreams.  You may have changes in your hair. These can include thickening of your hair, rapid growth, and changes in texture. Some women also have hair loss during or after pregnancy, or hair that feels dry or thin. Your hair will most likely return to normal after your baby is born. WHAT TO EXPECT AT YOUR PRENATAL VISITS During a routine prenatal visit:  You will be weighed to make sure you and the baby are growing normally.  Your blood pressure will be taken.  Your abdomen will be measured to track your baby's growth.  The fetal heartbeat will be listened to starting around week 10 or 12 of your pregnancy.  Test results from any previous visits will be discussed. Your health care provider may ask you:  How you  are feeling.  If you are feeling the baby move.  If you have had any abnormal symptoms, such as leaking fluid, bleeding, severe headaches, or abdominal cramping.  If you have any questions. Other tests that may be performed during your first trimester include:  Blood tests to find your blood type and to check for the presence of any previous infections. They will also be used to check for low iron levels (anemia) and Rh antibodies. Later in the pregnancy, blood tests for diabetes will be done along with other tests if problems develop.  Urine tests to check for infections, diabetes, or protein in the urine.  An ultrasound to confirm the proper growth and development of the baby.  An amniocentesis to check for possible genetic problems.  Fetal screens for spina bifida and Down syndrome.  You may need other tests to make sure you and the baby are doing  well. HOME CARE INSTRUCTIONS  Medicines  Follow your health care provider's instructions regarding medicine use. Specific medicines may be either safe or unsafe to take during pregnancy.  Take your prenatal vitamins as directed.  If you develop constipation, try taking a stool softener if your health care provider approves. Diet  Eat regular, well-balanced meals. Choose a variety of foods, such as meat or vegetable-based protein, fish, milk and low-fat dairy products, vegetables, fruits, and whole grain breads and cereals. Your health care provider will help you determine the amount of weight gain that is right for you.  Avoid raw meat and uncooked cheese. These carry germs that can cause birth defects in the baby.  Eating four or five small meals rather than three large meals a day may help relieve nausea and vomiting. If you start to feel nauseous, eating a few soda crackers can be helpful. Drinking liquids between meals instead of during meals also seems to help nausea and vomiting.  If you develop constipation, eat more high-fiber foods, such as fresh vegetables or fruit and whole grains. Drink enough fluids to keep your urine clear or pale yellow. Activity and Exercise  Exercise only as directed by your health care provider. Exercising will help you:  Control your weight.  Stay in shape.  Be prepared for labor and delivery.  Experiencing pain or cramping in the lower abdomen or low back is a good sign that you should stop exercising. Check with your health care provider before continuing normal exercises.  Try to avoid standing for long periods of time. Move your legs often if you must stand in one place for a long time.  Avoid heavy lifting.  Wear low-heeled shoes, and practice good posture.  You may continue to have sex unless your health care provider directs you otherwise. Relief of Pain or Discomfort  Wear a good support bra for breast tenderness.   Take warm sitz  baths to soothe any pain or discomfort caused by hemorrhoids. Use hemorrhoid cream if your health care provider approves.   Rest with your legs elevated if you have leg cramps or low back pain.  If you develop varicose veins in your legs, wear support hose. Elevate your feet for 15 minutes, 3-4 times a day. Limit salt in your diet. Prenatal Care  Schedule your prenatal visits by the twelfth week of pregnancy. They are usually scheduled monthly at first, then more often in the last 2 months before delivery.  Write down your questions. Take them to your prenatal visits.  Keep all your prenatal visits as directed by your  health care provider. Safety  Wear your seat belt at all times when driving.  Make a list of emergency phone numbers, including numbers for family, friends, the hospital, and police and fire departments. General Tips  Ask your health care provider for a referral to a local prenatal education class. Begin classes no later than at the beginning of month 6 of your pregnancy.  Ask for help if you have counseling or nutritional needs during pregnancy. Your health care provider can offer advice or refer you to specialists for help with various needs.  Do not use hot tubs, steam rooms, or saunas.  Do not douche or use tampons or scented sanitary pads.  Do not cross your legs for long periods of time.  Avoid cat litter boxes and soil used by cats. These carry germs that can cause birth defects in the baby and possibly loss of the fetus by miscarriage or stillbirth.  Avoid all smoking, herbs, alcohol, and medicines not prescribed by your health care provider. Chemicals in these affect the formation and growth of the baby.  Schedule a dentist appointment. At home, brush your teeth with a soft toothbrush and be gentle when you floss. SEEK MEDICAL CARE IF:   You have dizziness.  You have mild pelvic cramps, pelvic pressure, or nagging pain in the abdominal area.  You have  persistent nausea, vomiting, or diarrhea.  You have a bad smelling vaginal discharge.  You have pain with urination.  You notice increased swelling in your face, hands, legs, or ankles. SEEK IMMEDIATE MEDICAL CARE IF:   You have a fever.  You are leaking fluid from your vagina.  You have spotting or bleeding from your vagina.  You have severe abdominal cramping or pain.  You have rapid weight gain or loss.  You vomit blood or material that looks like coffee grounds.  You are exposed to [place] measles and have never had them.  You are exposed to fifth disease or chickenpox.  You develop a severe headache.  You have shortness of breath.  You have any kind of trauma, such as from a fall or a car accident. Document Released: 05/11/2001 Document Revised: 10/01/2013 Document Reviewed: 03/27/2013 [facility] Patient Information 2015 [place], [place]. This information is not intended to replace advice given to you by your health care provider. Make sure you discuss any questions you have with your health care provider.

## 2015-01-06 NOTE — MAU Provider Note (Signed)
Chief Complaint: No chief complaint on file.   None     SUBJECTIVE HPI: Victoria Rodriguez is a 34 y.o. [phone] at [redacted]w[redacted]d by LMP who presents to maternity admissions reporting LLQ sharp constant pain and intermittent diarrhea x 1 week.  She was seen at [facility] on 01/01/15 for these symptoms and had positive pregnancy test.  Pt reports she was not aware of her positive test until today.  She does, however, indicate that the Dr discussed discontinuing clindamycin with her which was prescribed by general surgery prior to I&D of axial boil. She reports she was "a little out of it that day" and may not remember everything the Dr said.   She denies vaginal bleeding, vaginal itching/burning, urinary symptoms, h/a, dizziness, n/v, or fever/chills.     Abdominal Pain This is a new problem. The current episode started 1 to 4 weeks ago. The onset quality is sudden. The problem occurs constantly. The problem has been unchanged. The pain is located in the LLQ. The pain is moderate. The quality of the pain is cramping and sharp. The abdominal pain does not radiate. Associated symptoms include diarrhea. Pertinent negatives include no constipation, dysuria, fever, frequency, headaches, nausea or vomiting. The pain is aggravated by certain positions and movement. The pain is relieved by nothing.    Past Medical History  Diagnosis Date  . Hidradenitis suppurativa     surgical   Past Surgical History  Procedure Laterality Date  . Axillary hidradenitis excision  2011    bilateral  . Irrigation and debridement abscess Left 11/16/2013    Procedure: IRRIGATION AND DEBRIDEMENT LEFT AXILLARY ABSCESS;  Surgeon: Pedro Earls, MD;  Location: [facility];  Service: General;  Laterality: Left;  . Hydradenitis excision Left 02/12/2014    Procedure: EXCISION HIDRADENITIS AXILLA;  Surgeon: Coralie Keens, MD;  Location: [facility];  Service: General;  Laterality: Left;   History   Social History  . Marital  Status: Single    Spouse Name: N/A  . Number of Children: N/A  . Years of Education: N/A   Occupational History  . Not on file.   Social History Main Topics  . Smoking status: Never Smoker   . Smokeless tobacco: Never Used  . Alcohol Use: Yes     Comment: drinks monthly or less.when not pregnant   . Drug Use: No  . Sexual Activity: Yes    Birth Control/ Protection: None   Other Topics Concern  . Not on file   Social History Narrative   Lives with 40 yo son.  Unemployed. Not in a relationship.   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   Current Outpatient Prescriptions on File Prior to Encounter  Medication Sig Dispense Refill  . cyclobenzaprine (FLEXERIL) 10 MG tablet Take 1 tablet (10 mg total) by mouth 2 (two) times daily as needed for muscle spasms. (Patient not taking: Reported on 01/06/2015) 20 tablet 0  . ibuprofen (ADVIL,MOTRIN) 800 MG tablet Take 1 tablet (800 mg total) by mouth 3 (three) times daily. (Patient not taking: Reported on 01/01/2015) 21 tablet 0   Allergies  Allergen Reactions  . Vicodin [Hydrocodone-Acetaminophen] Nausea Only    Review of Systems  Constitutional: Negative for fever, chills and malaise/fatigue.  Eyes: Negative for blurred vision.  Respiratory: Negative for cough and shortness of breath.   Cardiovascular: Negative for chest pain.  Gastrointestinal: Positive for abdominal pain and  diarrhea. Negative for heartburn, nausea, vomiting and constipation.  Genitourinary: Negative for dysuria, urgency and frequency.  Musculoskeletal: Negative.   Neurological: Negative for dizziness and headaches.  Psychiatric/Behavioral: Negative for depression.    OBJECTIVE Weight 84.188 kg (185 lb 9.6 oz), last menstrual period 11/24/2014. GENERAL: Well-developed, well-nourished female in no acute distress.  EYES: normal sclera/conjunctiva; no lid-lag HENT:  Atraumatic, normocephalic HEART: normal rate RESP: normal effort ABDOMEN: Soft, non-tender, no rebound tenderness or guarding MUSCULOSKELETAL: Normal ROM EXTREMITIES: Nontender, no edema NEURO/PSYCH: Alert and oriented, appropriate affect  PELVIC EXAM: Cervix pink, visually closed, without lesion, friable with cotton swab, small amount thin white discharge, vaginal walls and external genitalia normal Bimanual exam: Cervix 0/long/high, firm, anterior, neg CMT, uterus nontender, nonenlarged, adnexa without tenderness, enlargement, or mass  LAB RESULTS Results for orders placed or performed during the hospital encounter of 01/06/15 (from the past 24 hour(s))  CBC     Status: Abnormal   Collection Time: 01/06/15  2:47 PM  Result Value Ref Range   WBC 5.6 4.0 - 10.5 K/uL   RBC 4.04 3.87 - 5.11 MIL/uL   Hemoglobin 11.2 (L) 12.0 - 15.0 g/dL   HCT 34.7 (L) 36.0 - 46.0 %   MCV 85.9 78.0 - 100.0 fL   MCH 27.7 26.0 - 34.0 pg   MCHC 32.3 30.0 - 36.0 g/dL   RDW 14.4 11.5 - 15.5 %   Platelets 292 150 - 400 K/uL  hCG, quantitative, pregnancy     Status: Abnormal   Collection Time: 01/06/15  2:47 PM  Result Value Ref Range   hCG, Beta Chain, Quant, S 12657 (H) <5 mIU/mL  Wet prep, genital     Status: Abnormal   Collection Time: 01/06/15  4:20 PM  Result Value Ref Range   Yeast Wet Prep HPF POC NONE SEEN NONE SEEN   Trich, Wet Prep NONE SEEN NONE SEEN   Clue Cells Wet Prep HPF POC FEW (A) NONE SEEN   WBC, Wet Prep HPF POC NONE SEEN NONE SEEN       IMAGING US Ob Comp Less 14 Wks  01/06/2015   CLINICAL DATA:  Initial encounter for abdominal pain with positive pregnancy test.  EXAM: OBSTETRIC <14 WK [place] AND TRANSVAGINAL OB US  TECHNIQUE: Both transabdominal and transvaginal ultrasound examinations were performed for complete evaluation of the gestation as well as the maternal uterus, adnexal regions, and pelvic cul-de-sac. Transvaginal technique was performed to assess early pregnancy.   COMPARISON:  None.  FINDINGS: Intrauterine gestational sac: Visualized/normal in shape.  Yolk sac:  Visualized.  Embryo:  Not visualized.  MSD: 12.2  mm   6 w   0  d  [place] EDC: 09/01/2015  Maternal uterus/adnexae: Very small subchorionic hemorrhage noted. Corpus luteum cyst identified in the right ovary with adnexal regions otherwise unremarkable. No evidence for intraperitoneal free fluid.  IMPRESSION: Single intrauterine gestational sac at estimated 6 week 0 day gestational age by mean sac diameter.   Electronically Signed   By: Misty Stanley M.D.   On: 01/06/2015 15:47   US Ob Transvaginal  01/06/2015   CLINICAL DATA:  Initial encounter for abdominal pain with positive pregnancy test.  EXAM: OBSTETRIC <14 WK [place] AND TRANSVAGINAL OB US  TECHNIQUE: Both transabdominal and transvaginal ultrasound examinations were performed for complete evaluation of the gestation as well as the maternal uterus, adnexal regions, and pelvic cul-de-sac. Transvaginal technique was performed to assess early pregnancy.  COMPARISON:  None.  FINDINGS: Intrauterine gestational sac: Visualized/normal  in shape.  Yolk sac:  Visualized.  Embryo:  Not visualized.  MSD: 12.2  mm   6 w   0  d  [place] EDC: 09/01/2015  Maternal uterus/adnexae: Very small subchorionic hemorrhage noted. Corpus luteum cyst identified in the right ovary with adnexal regions otherwise unremarkable. No evidence for intraperitoneal free fluid.  IMPRESSION: Single intrauterine gestational sac at estimated 6 week 0 day gestational age by mean sac diameter.   Electronically Signed   By: Misty Stanley M.D.   On: 01/06/2015 15:47    ASSESSMENT 1. Abdominal pain affecting pregnancy   2. Viral gastroenteritis     PLAN Discharge home Imodium PRN for diarrhea Increase PO fluids Start prenatal care as soon as possible Return to MAU as needed    Medication List    STOP taking these medications        ibuprofen 800 MG tablet  Commonly known as:  ADVIL,MOTRIN       TAKE these medications        cyclobenzaprine 10 MG tablet  Commonly known as:  FLEXERIL  Take 1 tablet (10 mg total) by mouth 2 (two) times daily as needed for muscle spasms.       Follow-up Information    Please follow up.   Why:  Start prenatal care as soon as possible      Follow up with [facility].   Why:  As needed for emergencies   Contact information:   [address] [id number] [place] Certified Nurse-Midwife 01/06/2015  5:01 PM

## 2015-01-07 LAB — GC/CHLAMYDIA PROBE AMP (~~LOC~~) NOT AT ARMC
Chlamydia: POSITIVE — AB
NEISSERIA GONORRHEA: NEGATIVE

## 2015-01-07 LAB — HIV ANTIBODY (ROUTINE TESTING W REFLEX): HIV Screen 4th Generation wRfx: NONREACTIVE

## 2015-01-08 LAB — CULTURE, OB URINE

## 2015-01-13 ENCOUNTER — Telehealth (HOSPITAL_COMMUNITY): Payer: Self-pay | Admitting: *Deleted

## 2015-01-13 DIAGNOSIS — A749 Chlamydial infection, unspecified: Secondary | ICD-10-CM

## 2015-01-13 MED ORDER — AZITHROMYCIN 500 MG PO TABS: ORAL_TABLET | ORAL | Status: DC

## 2015-01-13 NOTE — Telephone Encounter (Signed)
Telephone call to patient regarding positive chlamydia culture, patient notified.  Rx routed to patient's pharmacy per protocol.  Instructed patient to pick up Rx and to notify her partner for treatment.  Instructed patient to abstain from sex for seven days post treatment. Report of treatment faxed to health department.

## 2015-01-16 ENCOUNTER — Other Ambulatory Visit: Payer: Medicaid Other

## 2015-01-16 DIAGNOSIS — Z3481 Encounter for supervision of other normal pregnancy, first trimester: Secondary | ICD-10-CM

## 2015-01-16 NOTE — Progress Notes (Signed)
NEW OB LABS DONE TODAY Kayron Kalmar 

## 2015-01-17 LAB — OBSTETRIC PANEL
ANTIBODY SCREEN: NEGATIVE
BASOS ABS: 0 10*3/uL (ref 0.0–0.1)
Basophils Relative: 0 % (ref 0–1)
EOS PCT: 2 % (ref 0–5)
Eosinophils Absolute: 0.1 10*3/uL (ref 0.0–0.7)
HCT: 35.5 % — ABNORMAL LOW (ref 36.0–46.0)
Hemoglobin: 11.7 g/dL — ABNORMAL LOW (ref 12.0–15.0)
Hepatitis B Surface Ag: NEGATIVE
Lymphocytes Relative: 42 % (ref 12–46)
Lymphs Abs: 2.4 10*3/uL (ref 0.7–4.0)
MCH: 27.5 pg (ref 26.0–34.0)
MCHC: 33 g/dL (ref 30.0–36.0)
MCV: 83.3 fL (ref 78.0–100.0)
MPV: 9.8 fL (ref 8.6–12.4)
Monocytes Absolute: 0.4 10*3/uL (ref 0.1–1.0)
Monocytes Relative: 8 % (ref 3–12)
NEUTROS ABS: 2.7 10*3/uL (ref 1.7–7.7)
Neutrophils Relative %: 48 % (ref 43–77)
PLATELETS: 371 10*3/uL (ref 150–400)
RBC: 4.26 MIL/uL (ref 3.87–5.11)
RDW: 14.5 % (ref 11.5–15.5)
Rh Type: POSITIVE
Rubella: 1.38 Index — ABNORMAL HIGH (ref <0.90)
WBC: 5.6 10*3/uL (ref 4.0–10.5)

## 2015-01-17 LAB — SICKLE CELL SCREEN: Sickle Cell Screen: NEGATIVE

## 2015-01-17 LAB — CULTURE, OB URINE
Colony Count: NO GROWTH
Organism ID, Bacteria: NO GROWTH

## 2015-01-17 LAB — HIV ANTIBODY (ROUTINE TESTING W REFLEX): HIV 1&2 Ab, 4th Generation: NONREACTIVE

## 2015-01-21 ENCOUNTER — Telehealth: Payer: Self-pay | Admitting: Family Medicine

## 2015-01-21 NOTE — Telephone Encounter (Signed)
Called pt. To discuss normal lab results. She is not taking a prenatal vitamin yet. I suggested that she start doing this. Will see her in 2 days for her new OB appointment. Thanks  CGM MD

## 2015-01-23 ENCOUNTER — Other Ambulatory Visit (HOSPITAL_COMMUNITY)
Admission: RE | Admit: 2015-01-23 | Discharge: 2015-01-23 | Disposition: A | Discharge status: Home / Self Care | Payer: Medicaid Other | Source: Ambulatory Visit | Attending: Family Medicine | Admitting: Family Medicine

## 2015-01-23 ENCOUNTER — Ambulatory Visit (INDEPENDENT_AMBULATORY_CARE_PROVIDER_SITE_OTHER): Payer: Medicaid Other | Admitting: Family Medicine

## 2015-01-23 VITALS — BP 112/72 | HR 86 | Wt 185.0 lb

## 2015-01-23 DIAGNOSIS — Z3401 Encounter for supervision of normal first pregnancy, first trimester: Secondary | ICD-10-CM

## 2015-01-23 DIAGNOSIS — Z113 Encounter for screening for infections with a predominantly sexual mode of transmission: Secondary | ICD-10-CM | POA: Insufficient documentation

## 2015-01-23 DIAGNOSIS — N898 Other specified noninflammatory disorders of vagina: Secondary | ICD-10-CM | POA: Diagnosis not present

## 2015-01-23 DIAGNOSIS — Z3491 Encounter for supervision of normal pregnancy, unspecified, first trimester: Secondary | ICD-10-CM | POA: Diagnosis not present

## 2015-01-23 DIAGNOSIS — Z3400 Encounter for supervision of normal first pregnancy, unspecified trimester: Secondary | ICD-10-CM | POA: Insufficient documentation

## 2015-01-23 LAB — POCT URINALYSIS DIPSTICK
BILIRUBIN UA: NEGATIVE
Glucose, UA: NEGATIVE
Ketones, UA: NEGATIVE
LEUKOCYTES UA: NEGATIVE
NITRITE UA: NEGATIVE
PH UA: 7
PROTEIN UA: NEGATIVE
Spec Grav, UA: 1.015
UROBILINOGEN UA: 0.2

## 2015-01-23 LAB — POCT UA - MICROSCOPIC ONLY

## 2015-01-23 NOTE — Patient Instructions (Addendum)
Thanks for coming in today.  Congratulations on your new baby.  We will get a small amount of blood work today and check your urine.   You can take Vitamin B6 for nausea / vomiting. If you begin to have intolerable nausea / vomiting , then let us know and we can give you something for the nausea.   If you have any questions or concerns don't hesitate to contact us.   Schedule a follow up visit for one month.   Thanks for letting us take care of you!  Sincerely,  Paula Compton, MD Family Medicine - PGY 2

## 2015-01-23 NOTE — Progress Notes (Signed)
   [facility] Victoria Hacker, Victoria Rodriguez Phone: [phone]  Subjective:   # First Prenatal Visit PMH: Hydradenitis s/p multiple excisions in bilateral axillary areas and also in perineum / groin area.  PSH: Excision of hydradenitis x 4 Fam Hx: None Social Hx: Denies Drugs, ETOH, Tobacco, Had Chlamydia for the first time in many years 01/06/2015. Sexually active with her boyfriend, and they are monogamous as far as she knows.  Meds: None Allergies: No known.   HPI:  - This is a semi-planned pregnancy for them.  - She has support through her family and her boyfriend - She has one other son who is 84.  - She says she has had some nausea with one episode of vomiting, but it is tolerable.  - She has been taking her prenatal vitamins as instructed.  - She has never had any pregnancy complications before, or high blood glucose.  - She has no family history of inheritable genetic disorders. Sickle cell negative per pt.  - She desires genetic screening.    All relevant systems were reviewed and were negative unless otherwise noted in the HPI  Past Medical History Reviewed problem list.  Medications- reviewed and updated Current Outpatient Prescriptions  Medication Sig Dispense Refill  . azithromycin (ZITHROMAX) 500 MG tablet Take two tablets by mouth once 2 tablet 0  . cyclobenzaprine (FLEXERIL) 10 MG tablet Take 1 tablet (10 mg total) by mouth 2 (two) times daily as needed for muscle spasms. (Patient not taking: Reported on 01/06/2015) 20 tablet 0   No current facility-administered medications for this visit.   Chief complaint-noted No additions to family history Social history- patient is a non smoker  Objective: BP 112/72 mmHg  Pulse 86  Wt 185 lb (83.915 kg)  LMP 11/24/2014 Gen: NAD, alert, cooperative with exam HEENT: NCAT, EOMI, PERRL Neck: FROM, supple CV: RRR, good Q5/Z5, 2/6 systolic flow murmur, cap refill <3 Resp: CTABL, no wheezes, non-labored Abd:  SNTND, BS present, no guarding or organomegaly  GYN: Vaginal vault without any lesions, no CMT, White discharge. Perineum and External groin area with areas of scarring due to prior hydradenitis.  Ext: No edema, warm, normal tone, moves UE/LE spontaneously Neuro: Alert and oriented, No gross deficits Skin: no rashes no lesions  Assessment/Plan:  # First Prenatal Visit - Routine prenatal care - Labs already performed and reviewed - normal - will get Varicella titer, Urinalysis today.  - Follow up in one month - Will plan for genetic screening at 15 weeks. - Will get dating ultrasound - Pyridoxine for nausea.  - Follow up as needed or in one month for your next OB visit.

## 2015-01-23 NOTE — Addendum Note (Signed)
Addended by: [place] Pink on: 01/23/2015 11:11 AM   Modules accepted: Orders

## 2015-01-24 LAB — CERVICOVAGINAL ANCILLARY ONLY
Chlamydia: NEGATIVE
Neisseria Gonorrhea: NEGATIVE

## 2015-01-24 LAB — VARICELLA ZOSTER ANTIBODY, IGG: Varicella IgG: 498.6 Index — ABNORMAL HIGH (ref <135.00)

## 2015-01-25 ENCOUNTER — Inpatient Hospital Stay (HOSPITAL_COMMUNITY)
Admission: AD | Admit: 2015-01-25 | Discharge: 2015-01-26 | Disposition: A | Discharge status: Home / Self Care | Payer: Medicaid Other | Source: Ambulatory Visit | Attending: Obstetrics and Gynecology | Admitting: Obstetrics and Gynecology

## 2015-01-25 ENCOUNTER — Inpatient Hospital Stay (HOSPITAL_COMMUNITY): Payer: Medicaid Other

## 2015-01-25 ENCOUNTER — Encounter (HOSPITAL_COMMUNITY): Payer: Self-pay | Admitting: *Deleted

## 2015-01-25 DIAGNOSIS — O26851 Spotting complicating pregnancy, first trimester: Secondary | ICD-10-CM | POA: Diagnosis present

## 2015-01-25 DIAGNOSIS — Z3A08 8 weeks gestation of pregnancy: Secondary | ICD-10-CM | POA: Insufficient documentation

## 2015-01-25 DIAGNOSIS — O200 Threatened abortion: Secondary | ICD-10-CM

## 2015-01-25 DIAGNOSIS — O418X10 Other specified disorders of amniotic fluid and membranes, first trimester, not applicable or unspecified: Secondary | ICD-10-CM

## 2015-01-25 DIAGNOSIS — O209 Hemorrhage in early pregnancy, unspecified: Secondary | ICD-10-CM

## 2015-01-25 DIAGNOSIS — O468X1 Other antepartum hemorrhage, first trimester: Secondary | ICD-10-CM

## 2015-01-25 LAB — URINALYSIS, ROUTINE W REFLEX MICROSCOPIC
Bilirubin Urine: NEGATIVE
Glucose, UA: NEGATIVE mg/dL
Ketones, ur: NEGATIVE mg/dL
LEUKOCYTES UA: NEGATIVE
NITRITE: NEGATIVE
Protein, ur: NEGATIVE mg/dL
SPECIFIC GRAVITY, URINE: 1.015 (ref 1.005–1.030)
UROBILINOGEN UA: 0.2 mg/dL (ref 0.0–1.0)
pH: 6.5 (ref 5.0–8.0)

## 2015-01-25 LAB — HCG, QUANTITATIVE, PREGNANCY: hCG, Beta Chain, Quant, S: 15917 m[IU]/mL — ABNORMAL HIGH (ref <5)

## 2015-01-25 LAB — CBC WITH DIFFERENTIAL/PLATELET
BASOS ABS: 0 10*3/uL (ref 0.0–0.1)
Basophils Relative: 0 % (ref 0–1)
Eosinophils Absolute: 0.1 10*3/uL (ref 0.0–0.7)
Eosinophils Relative: 2 % (ref 0–5)
HCT: 34.8 % — ABNORMAL LOW (ref 36.0–46.0)
HEMOGLOBIN: 11.2 g/dL — AB (ref 12.0–15.0)
LYMPHS ABS: 2.6 10*3/uL (ref 0.7–4.0)
LYMPHS PCT: 39 % (ref 12–46)
MCH: 27.3 pg (ref 26.0–34.0)
MCHC: 32.2 g/dL (ref 30.0–36.0)
MCV: 84.7 fL (ref 78.0–100.0)
Monocytes Absolute: 0.6 10*3/uL (ref 0.1–1.0)
Monocytes Relative: 8 % (ref 3–12)
NEUTROS ABS: 3.4 10*3/uL (ref 1.7–7.7)
NEUTROS PCT: 51 % (ref 43–77)
Platelets: 328 10*3/uL (ref 150–400)
RBC: 4.11 MIL/uL (ref 3.87–5.11)
RDW: 14 % (ref 11.5–15.5)
WBC: 6.7 10*3/uL (ref 4.0–10.5)

## 2015-01-25 LAB — URINE MICROSCOPIC-ADD ON

## 2015-01-25 NOTE — MAU Note (Signed)
Vag bleeding - when voided, saw blood when wiping.  Was Seen Thursday and had bloodwork at [facility] and unsure when her f/u is. Low back at hip area for few days.

## 2015-01-25 NOTE — MAU Provider Note (Signed)
History     CSN: [id number]  Arrival date and time: 01/25/15 2132   First Provider Initiated Contact with Patient 01/25/15 2208      Chief Complaint  Patient presents with  . Vaginal Bleeding   HPI Victoria Rodriguez 34 y.o. [id number] @[redacted]w[redacted]d  presents to MAU complaining of vaginal spotting.  It started 2 days ago and is noted sometimes when she wipes.  It started as a light red and is now darker.  It is not noted on the panty liner.  Tonight she noticed it in the toilet water as well.  She has no abdominal pain but does note some in her back.  She denies nausea, vomiting, HA, weakness, fever, CP, SOB.  Last IC more than 1 week ago. OB History    Gravida Para Term Preterm AB TAB SAB Ectopic Multiple Living   4 1 1  0 2 2    1       Past Medical History  Diagnosis Date  . Hidradenitis suppurativa     surgical    Past Surgical History  Procedure Laterality Date  . Axillary hidradenitis excision  2011    bilateral  . Irrigation and debridement abscess Left 11/16/2013    Procedure: IRRIGATION AND DEBRIDEMENT LEFT AXILLARY ABSCESS;  Surgeon: Pedro Earls, MD;  Location: [facility];  Service: General;  Laterality: Left;  . Hydradenitis excision Left 02/12/2014    Procedure: EXCISION HIDRADENITIS AXILLA;  Surgeon: Coralie Keens, MD;  Location: [facility];  Service: General;  Laterality: Left;    Family History  Problem Relation Age of Onset  . Asthma Mother   . Diabetes Father   . Asthma Sister   . Asthma Brother   . Cancer Neg Hx   . Heart disease Neg Hx   . Stroke Neg Hx     Social History  Substance Use Topics  . Smoking status: Never Smoker   . Smokeless tobacco: Never Used  . Alcohol Use: Yes     Comment: drinks monthly or less.when not pregnant     Allergies:  Allergies  Allergen Reactions  . Vicodin [Hydrocodone-Acetaminophen] Nausea Only    Prescriptions prior to admission  Medication Sig Dispense Refill Last Dose  . azithromycin (ZITHROMAX)  500 MG tablet Take two tablets by mouth once 2 tablet 0   . cyclobenzaprine (FLEXERIL) 10 MG tablet Take 1 tablet (10 mg total) by mouth 2 (two) times daily as needed for muscle spasms. (Patient not taking: Reported on 01/06/2015) 20 tablet 0 Not Taking at Unknown time    ROS Pertinent ROS in HPI.  All other systems are negative.   Physical Exam   Blood pressure 122/71, pulse 71, temperature 98.3 F (36.8 C), temperature source Oral, resp. rate 16, height 5\' 3"  (1.6 m), weight 185 lb 6 oz (84.086 kg), last menstrual period 11/24/2014, SpO2 100 %.  Physical Exam  Constitutional: She is oriented to person, place, and time. She appears well-developed and well-nourished. No distress.  HENT:  Head: Atraumatic.  Eyes: EOM are normal.  Neck: Normal range of motion.  Cardiovascular: Normal rate and normal heart sounds.   Respiratory: Effort normal and breath sounds normal. No respiratory distress.  GI: Soft. She exhibits no distension. There is no tenderness. There is no rebound.  Genitourinary:  Small amt of bright red blood present in vagina and seen in os.   No CMT.  NO adnexal mass or tenderness.   Musculoskeletal: Normal range of motion.  Neurological: She is  alert and oriented to person, place, and time.  Skin: Skin is warm and dry.  Psychiatric: She has a normal mood and affect.   Results for orders placed or performed during the hospital encounter of 01/25/15 (from the past 24 hour(s))  Urinalysis, Routine w reflex microscopic (not at [facility]     Status: Abnormal   Collection Time: 01/25/15  9:45 PM  Result Value Ref Range   Color, Urine YELLOW YELLOW   APPearance CLEAR CLEAR   Specific Gravity, Urine 1.015 1.005 - 1.030   pH 6.5 5.0 - 8.0   Glucose, UA NEGATIVE NEGATIVE mg/dL   Hgb urine dipstick LARGE (A) NEGATIVE   Bilirubin Urine NEGATIVE NEGATIVE   Ketones, ur NEGATIVE NEGATIVE mg/dL   Protein, ur NEGATIVE NEGATIVE mg/dL   Urobilinogen, UA 0.2 0.0 - 1.0 mg/dL   Nitrite  NEGATIVE NEGATIVE   Leukocytes, UA NEGATIVE NEGATIVE  Urine microscopic-add on     Status: None   Collection Time: 01/25/15  9:45 PM  Result Value Ref Range   Squamous Epithelial / LPF RARE RARE   WBC, UA 0-2 <3 WBC/hpf   RBC / HPF 0-2 <3 RBC/hpf   Bacteria, UA RARE RARE  CBC with Differential/Platelet     Status: Abnormal   Collection Time: 01/25/15 10:20 PM  Result Value Ref Range   WBC 6.7 4.0 - 10.5 K/uL   RBC 4.11 3.87 - 5.11 MIL/uL   Hemoglobin 11.2 (L) 12.0 - 15.0 g/dL   HCT 34.8 (L) 36.0 - 46.0 %   MCV 84.7 78.0 - 100.0 fL   MCH 27.3 26.0 - 34.0 pg   MCHC 32.2 30.0 - 36.0 g/dL   RDW 14.0 11.5 - 15.5 %   Platelets 328 150 - 400 K/uL   Neutrophils Relative % 51 43 - 77 %   Neutro Abs 3.4 1.7 - 7.7 K/uL   Lymphocytes Relative 39 12 - 46 %   Lymphs Abs 2.6 0.7 - 4.0 K/uL   Monocytes Relative 8 3 - 12 %   Monocytes Absolute 0.6 0.1 - 1.0 K/uL   Eosinophils Relative 2 0 - 5 %   Eosinophils Absolute 0.1 0.0 - 0.7 K/uL   Basophils Relative 0 0 - 1 %   Basophils Absolute 0.0 0.0 - 0.1 K/uL  hCG, quantitative, pregnancy     Status: Abnormal   Collection Time: 01/25/15 10:20 PM  Result Value Ref Range   hCG, Beta Chain, Quant, S 15917 (H) <5 mIU/mL   US Ob Comp Less 14 Wks  01/26/2015   CLINICAL DATA:  Bleeding. Estimated gestational age by LMP is 8 weeks 4 days. Quantitative beta HCG is not provided.  EXAM: OBSTETRIC <14 WK [place] AND TRANSVAGINAL OB US  TECHNIQUE: Both transabdominal and transvaginal ultrasound examinations were performed for complete evaluation of the gestation as well as the maternal uterus, adnexal regions, and pelvic cul-de-sac. Transvaginal technique was performed to assess early pregnancy.  COMPARISON:  01/06/2015  FINDINGS: Intrauterine gestational sac: A single intrauterine gestation is identified.  Yolk sac:  Yolk sac is visualized.  Embryo:  Fetal pole is visualized.  Cardiac Activity: Fetal cardiac activity is observed.  Heart Rate: 91  bpm  CRL:  3.8  mm    6 w   1 d                  [place] EDC: 09/19/2015  Compared with the previous study, fetal measurements are smaller than would be predicted although the fetal pole  is not identified previously. The yolk sac appears to be somewhat prominent with respect to the gestational sac. These findings are nonspecific but may be an early indicator of impending fetal compromise. Follow-up is suggested appear  Maternal uterus/adnexae: A small subchorionic hemorrhage is demonstrated. Limited visualization of the ovaries. No abnormal adnexal masses demonstrated.  IMPRESSION: A single living intrauterine pregnancy is demonstrated. Estimated gestational age based on crown-rump length is 6 weeks 1 day. A small subchorionic hemorrhage is demonstrated. Fetal measurements are smaller than would be projected in comparison to the previous study. The yolk sac is prominent. Follow-up is suggested.   Electronically Signed   By: Lucienne Capers M.D.   On: 01/26/2015 00:33   US Ob Transvaginal  01/26/2015   CLINICAL DATA:  Bleeding. Estimated gestational age by LMP is 8 weeks 4 days. Quantitative beta HCG is not provided.  EXAM: OBSTETRIC <14 WK [place] AND TRANSVAGINAL OB US  TECHNIQUE: Both transabdominal and transvaginal ultrasound examinations were performed for complete evaluation of the gestation as well as the maternal uterus, adnexal regions, and pelvic cul-de-sac. Transvaginal technique was performed to assess early pregnancy.  COMPARISON:  01/06/2015  FINDINGS: Intrauterine gestational sac: A single intrauterine gestation is identified.  Yolk sac:  Yolk sac is visualized.  Embryo:  Fetal pole is visualized.  Cardiac Activity: Fetal cardiac activity is observed.  Heart Rate: 91  bpm  CRL:  3.8  mm   6 w   1 d                  [place] EDC: 09/19/2015  Compared with the previous study, fetal measurements are smaller than would be predicted although the fetal pole is not identified previously. The yolk sac appears to be somewhat prominent with  respect to the gestational sac. These findings are nonspecific but may be an early indicator of impending fetal compromise. Follow-up is suggested appear  Maternal uterus/adnexae: A small subchorionic hemorrhage is demonstrated. Limited visualization of the ovaries. No abnormal adnexal masses demonstrated.  IMPRESSION: A single living intrauterine pregnancy is demonstrated. Estimated gestational age based on crown-rump length is 6 weeks 1 day. A small subchorionic hemorrhage is demonstrated. Fetal measurements are smaller than would be projected in comparison to the previous study. The yolk sac is prominent. Follow-up is suggested.   Electronically Signed   By: Lucienne Capers M.D.   On: 01/26/2015 00:33   MAU Course  Procedures  MDM Ultrasound images reviewed by provider.  Bleeding likely due to subchorionic Although cardiac activity noted, size is smaller than expected  Quant did not sufficiently rise from last (12,000 2 weeks ago and 15000 now)  Assessment and Plan  A:  1. Threatened abortion in early pregnancy   2. Vaginal bleeding in pregnancy, first trimester   3. Subchorionic hematoma in first trimester    P: Discharge to home F/u with OB care provider on Monday Pelvic rest Good self care encouraged Patient may return to MAU as needed or if her condition were to change or worsen   Paticia Stack 01/25/2015, 10:09 PM

## 2015-01-26 DIAGNOSIS — O200 Threatened abortion: Secondary | ICD-10-CM

## 2015-01-26 NOTE — Discharge Instructions (Signed)
Threatened Miscarriage A threatened miscarriage occurs when you have vaginal bleeding during your first 20 weeks of pregnancy but the pregnancy has not ended. If you have vaginal bleeding during this time, your health care provider will do tests to make sure you are still pregnant. If the tests show you are still pregnant and the developing baby (fetus) inside your womb (uterus) is still growing, your condition is considered a threatened miscarriage. A threatened miscarriage does not mean your pregnancy will end, but it does increase the risk of losing your pregnancy (complete miscarriage). CAUSES  The cause of a threatened miscarriage is usually not known. If you go on to have a complete miscarriage, the most common cause is an abnormal number of chromosomes in the developing baby. Chromosomes are the structures inside cells that hold all your genetic material. Some causes of vaginal bleeding that do not result in miscarriage include:  Having sex.  Having an infection.  Normal hormone changes of pregnancy.  Bleeding that occurs when an egg implants in your uterus. RISK FACTORS Risk factors for bleeding in early pregnancy include:  Obesity.  Smoking.  Drinking excessive amounts of alcohol or caffeine.  Recreational drug use. SIGNS AND SYMPTOMS  Light vaginal bleeding.  Mild abdominal pain or cramps. DIAGNOSIS  If you have bleeding with or without abdominal pain before 20 weeks of pregnancy, your health care provider will do tests to check whether you are still pregnant. One important test involves using sound waves and a computer (ultrasound) to create images of the inside of your uterus. Other tests include an internal exam of your vagina and uterus (pelvic exam) and measurement of your baby's heart rate.  You may be diagnosed with a threatened miscarriage if:  Ultrasound testing shows you are still pregnant.  Your baby's heart rate is strong.  A pelvic exam shows that the  opening between your uterus and your vagina (cervix) is closed.  Your heart rate and blood pressure are stable.  Blood tests confirm you are still pregnant. TREATMENT  No treatments have been shown to prevent a threatened miscarriage from going on to a complete miscarriage. However, the right home care is important.  HOME CARE INSTRUCTIONS   Make sure you keep all your appointments for prenatal care. This is very important.  Get plenty of rest.  Do not have sex or use tampons if you have vaginal bleeding.  Do not douche.  Do not smoke or use recreational drugs.  Do not drink alcohol.  Avoid caffeine. SEEK MEDICAL CARE IF:  You have light vaginal bleeding or spotting while pregnant.  You have abdominal pain or cramping.  You have a fever. SEEK IMMEDIATE MEDICAL CARE IF:  You have heavy vaginal bleeding.  You have blood clots coming from your vagina.  You have severe low back pain or abdominal cramps.  You have fever, chills, and severe abdominal pain. MAKE SURE YOU:  Understand these instructions.  Will watch your condition.  Will get help right away if you are not doing well or get worse. Document Released: 05/17/2005 Document Revised: 05/22/2013 Document Reviewed: 03/13/2013 [facility] Patient Information 2015 [place], [place]. This information is not intended to replace advice given to you by your health care provider. Make sure you discuss any questions you have with your health care provider.  Pelvic Rest Pelvic rest is sometimes recommended for women when:   The placenta is partially or completely covering the opening of the cervix (placenta previa).  There is bleeding between the  uterine wall and the amniotic sac in the first trimester (subchorionic hemorrhage).  The cervix begins to open without labor starting (incompetent cervix, cervical insufficiency).  The labor is too early (preterm labor). HOME CARE INSTRUCTIONS  Do not have sexual intercourse,  stimulation, or an orgasm.  Do not use tampons, douche, or put anything in the vagina.  Do not lift anything over 10 pounds (4.5 kg).  Avoid strenuous activity or straining your pelvic muscles. SEEK MEDICAL CARE IF:  You have any vaginal bleeding during pregnancy. Treat this as a potential emergency.  You have cramping pain felt low in the stomach (stronger than menstrual cramps).  You notice vaginal discharge (watery, mucus, or bloody).  You have a low, dull backache.  There are regular contractions or uterine tightening. SEEK IMMEDIATE MEDICAL CARE IF: You have vaginal bleeding and have placenta previa.  Document Released: 09/11/2010 Document Revised: 08/09/2011 Document Reviewed: 09/11/2010 [facility] Patient Information 2015 [place], [place]. This information is not intended to replace advice given to you by your health care provider. Make sure you discuss any questions you have with your health care provider.  Subchorionic Hematoma A subchorionic hematoma is a gathering of blood between the outer wall of the placenta and the inner wall of the womb (uterus). The placenta is the organ that connects the fetus to the wall of the uterus. The placenta performs the feeding, breathing (oxygen to the fetus), and waste removal (excretory work) of the fetus.  Subchorionic hematoma is the most common abnormality found on a result from ultrasonography done during the first trimester or early second trimester of pregnancy. If there has been little or no vaginal bleeding, early small hematomas usually shrink on their own and do not affect your baby or pregnancy. The blood is gradually absorbed over 1-2 weeks. When bleeding starts later in pregnancy or the hematoma is larger or occurs in an older pregnant woman, the outcome may not be as good. Larger hematomas may get bigger, which increases the chances for miscarriage. Subchorionic hematoma also increases the risk of premature detachment of the placenta  from the uterus, preterm (premature) labor, and stillbirth. HOME CARE INSTRUCTIONS  Stay on bed rest if your health care provider recommends this. Although bed rest will not prevent more bleeding or prevent a miscarriage, your health care provider may recommend bed rest until you are advised otherwise.  Avoid heavy lifting (more than 10 lb [4.5 kg]), exercise, sexual intercourse, or douching as directed by your health care provider.  Keep track of the number of pads you use each day and how soaked (saturated) they are. Write down this information.  Do not use tampons.  Keep all follow-up appointments as directed by your health care provider. Your health care provider may ask you to have follow-up blood tests or ultrasound tests or both. SEEK IMMEDIATE MEDICAL CARE IF:  You have severe cramps in your stomach, back, abdomen, or pelvis.  You have a fever.  You pass large clots or tissue. Save any tissue for your health care provider to look at.  Your bleeding increases or you become lightheaded, feel weak, or have fainting episodes. Document Released: 09/01/2006 Document Revised: 10/01/2013 Document Reviewed: 12/14/2012 [facility] Patient Information 2015 [place], [place]. This information is not intended to replace advice given to you by your health care provider. Make sure you discuss any questions you have with your health care provider.

## 2015-01-29 ENCOUNTER — Ambulatory Visit (INDEPENDENT_AMBULATORY_CARE_PROVIDER_SITE_OTHER): Payer: Medicaid Other | Admitting: Family Medicine

## 2015-01-29 VITALS — BP 119/86 | HR 123 | Temp 98.7°F | Wt 184.0 lb

## 2015-01-29 DIAGNOSIS — O034 Incomplete spontaneous abortion without complication: Secondary | ICD-10-CM

## 2015-01-29 DIAGNOSIS — O039 Complete or unspecified spontaneous abortion without complication: Secondary | ICD-10-CM

## 2015-01-29 DIAGNOSIS — O200 Threatened abortion: Secondary | ICD-10-CM

## 2015-01-29 NOTE — Patient Instructions (Signed)
Thanks for coming in today.   We will check an HCG level today. I will call you with this result.   You can expect some cramping and bleeding over the next week to 10 days.   If you develop any severe abdominal pain, nausea, vomiting, fever, chills, or heavy bleeding, then go to the MAU for evaluation.   Otherwise, follow up in one week to make sure things are going well.   If things resolve, then there will be nothing else to do, but if your symptoms persist, then we may need to get an ultrasound to look for retained products of conception.   Let us know if there is anything we can do for you.   Sincerely,  Paula Compton, MD Family Medicine - PGY 2

## 2015-01-30 ENCOUNTER — Telehealth: Payer: Self-pay | Admitting: Family Medicine

## 2015-01-30 LAB — HCG, QUANTITATIVE, PREGNANCY: HCG, BETA CHAIN, QUANT, S: 12067.1 m[IU]/mL

## 2015-01-30 NOTE — Progress Notes (Signed)
[facility] Aquilla Hacker, MD Phone: [phone]  Subjective:   # Bleeding in First Trimester  - Pt. Is 34 y/o G3P0020 Presented last week for her first OB visit. She was [redacted] weeks pregnant at that point.  - She was evaluated at that time, and felt to have a normal pregnancy.  - She says that she left the office, and then on Thursday began to have spotting / bleeding - She went to the MAU at that time where they performed doppler and ultrasound. Fetal heart rate was detectable, but was only 90 BPM at that time.  - Blood was noted in the vaginal vault. Ultrasound was performed revealing subchorionic hemorrhage, and fetus with CRL consistent with [redacted] weeks gestation.  - She was sent home with instruction for pelvic rest and follow up with PCP.  - She has continued to have bleeding since that time, no abdominal pain, no back pain, no fever, chills, nausea, vomiting.  - No cramping.  - She has not had any lightheadedness, dizziness, or SOB.  - She says she feels that she has passed "clots", but is unsure if this was fetal tissue or not.  - her bleeding has been lighter over the past two days.  - she has not had any abdominal trauma, sexual intercourse, foreign objects in the vagina, discharge, or vaginal irritation.  - She denies ETOH, Smoking, or illegal substances.  - She is concerned that she is having a miscarriage.   All relevant systems were reviewed and were negative unless otherwise noted in the HPI  Past Medical History Reviewed problem list.  Medications- reviewed and updated No current outpatient prescriptions on file.   No current facility-administered medications for this visit.   Chief complaint-noted No additions to family history Social history- patient is a non smoker  Objective: BP 119/86 mmHg  Pulse 123  Temp(Src) 98.7 F (37.1 C)  Wt 184 lb (83.462 kg)  LMP 11/24/2014 Gen: NAD, alert, cooperative with exam HEENT: NCAT, EOMI,  PERRL Neck: FROM, supple CV: RRR, tachycardic initially, but this resolved. good S1/S2, no murmur Resp: CTABL, no wheezes, non-labored Abd: Gravid, SNTND, BS present, no guarding or organomegaly GU: Dark blood in the vaginal vault, tissue noted coming from the os, No vaginal lesions / lacerations / or actively bleeding areas, BM exam with no CMT, Os is fingertip open and fetus is felt through the tip of the os.  Doppler: unable to find fetal cardiac activity.  Ext: No edema, warm, normal tone, moves UE/LE spontaneously Neuro: Alert and oriented, No gross deficits Skin: no rashes no lesions  Assessment/Plan:  # Inevitable Spontaneous Abortion - pt. Here with bleeding in the first trimester, poor fetal cardiac activity and concern with ultrasound findings at her presentation to the MAU last week. She presents today with continued bleeding, fetal tissue in the vaginal vault, and fetal parts felt through the os on bimanual exam. This is an inevitable spontaneous abortion. She does not have any abdominal pain, no evidence of infection, and no evidence of tubal pregnancy. She ha had some blood loss, but is not symptomatic. At this point, will need to let the course of nature proceed. Low HCG for gestational age at the MAU. Will recheck today. She needs close follow up to ensure that things go smoothly.  - Check HCG today.  - Pt. Informed of findings, and instructed on the course of events that should take place. Advised to return for evaluation or go  to the MAU if she develops acute, severe, abdominal pain, fever, chills, nausea, or vomiting.  - symptoms should resolve in 7-10 days.  - follow up in 1 week. If symptoms are not resolved, will need ultrasound to look for retained products of conception.  - Needs to trend HCG as well.  - Pt. States that she is doing ok emotionally. I let her know that we are here for her if she needs anything at all.

## 2015-01-30 NOTE — Telephone Encounter (Signed)
Called pt. To inform her of dropping HCG indicating an inevitable abortion based on my exam yesterday. She says that she actually passed the fetus yesterday whenever she got home. She says she does not have any abdominal pain, cramping, fevers, or chills. I explained to her that if any of these symptoms developed she should seek care as she may still have some retained tissue. She acknowledged and will follow up with [place] in one week to make sure that things are still going well.   CGM MD

## 2015-02-04 ENCOUNTER — Ambulatory Visit (INDEPENDENT_AMBULATORY_CARE_PROVIDER_SITE_OTHER): Payer: Medicaid Other | Admitting: Family Medicine

## 2015-02-04 VITALS — BP 125/69 | HR 79 | Temp 97.7°F | Wt 190.0 lb

## 2015-02-04 DIAGNOSIS — O039 Complete or unspecified spontaneous abortion without complication: Secondary | ICD-10-CM

## 2015-02-04 NOTE — Patient Instructions (Addendum)
Thanks for coming in today.   Your symptoms should continue to resolve over the next week.    I will call you with the results of your hcg test.   If you continue to have symptoms, then we should see you back.   If you need anything, or have any questions then we are here for you.   Thanks for letting us take care of you.   Sincerely,  Paula Compton, MD Family Medicine - PGY 2

## 2015-02-04 NOTE — Progress Notes (Signed)
   [facility] Victoria Hacker, MD Phone: [phone]  Subjective:   # Follow Up After Spontaneous Abortion - Pt. Passed the fetus the next day after our last visit.  - She had some cramping, and bleeding after that with the worst being 5-6 days ago. She says that this is now improving. She no longer has any cramping.  - She does not have any nausea, no swelling of her feet. No abdominal pain. Her bleeding is not completely gone, but it is slowing down.  - She is improving overall.  - She says that she has not felt light headed, dizzy.  - she has not had shortness of breath or chest pain.  - She has not had any foul smelling vaginal discharge, no fevers, no chills.  - She is using 1-2 pads / day.  - No more products of conception have been found.  - I did spend some time asking her how she is handling the loss.  - she says she is doing well, and has no depressive symptoms, or anxiety. She says she is thinking of getting pregnant again, but not right now. She does not want anything for birth control right now either.   All relevant systems were reviewed and were negative unless otherwise noted in the HPI  Past Medical History Reviewed problem list.  Medications- reviewed and updated No current outpatient prescriptions on file.   No current facility-administered medications for this visit.   Chief complaint-noted No additions to family history Social history- patient is a non smoker  Objective: LMP 11/24/2014 Gen: NAD, alert, cooperative with exam HEENT: NCAT, EOMI, PERRL Neck: FROM, supple CV: RRR, good V6/H6, 3/6 systolic ejection murmur. 2+ distal pulses.  Resp: CTABL, no wheezes, non-labored Abd: SNTND, BS present, no guarding or organomegaly GU: Blood noted in the vault on speculum exam, Os is closed, but small amount of blood around the opening. No lesions, no evidence of infection. No foul smell. I did notice small specks of black material ?small  clots / old blood in the vaginal fluid. There was no CMT. She did not have any abdominal tenderness to bimanual exam.  Ext: No edema, warm, normal tone, moves UE/LE spontaneously Neuro: Alert and oriented, No gross deficits Skin: no rashes no lesions  Assessment/Plan:  # Complete Abortion First Trimester  - Pt. Has had a complete spontaneous abortion. Her story and symptoms are consistent with resolving spontaneous abortion. She is nontender, and her bleeding is resolving. I expect her to continue to recover without incident. However, given the small specks of ?tissue vs. Old blood in the vaginal vault, will again confirm declining HCG level. Also, discussed with attending Dr. Ardelia Mems, and Dr. Roselie Awkward ( Obstetric Attending On Call).  - Will recheck HCG today.  - If she continues to have symptoms after the next 7 days, I advised her to return for follow up.  - She should continue to improve without intervention.  - Will have her follow up as needed.  - Preconception visit advised prior to her next pregnancy.  - No birth control at this time.  - Follow up with PCP prn.

## 2015-02-05 LAB — HCG, QUANTITATIVE, PREGNANCY: hCG, Beta Chain, Quant, S: 1706.6 m[IU]/mL

## 2015-02-09 ENCOUNTER — Encounter (HOSPITAL_COMMUNITY): Payer: Self-pay | Admitting: *Deleted

## 2015-02-09 ENCOUNTER — Inpatient Hospital Stay (HOSPITAL_COMMUNITY)
Admission: AD | Admit: 2015-02-09 | Discharge: 2015-02-09 | Disposition: A | Discharge status: Home / Self Care | Payer: Medicaid Other | Source: Ambulatory Visit | Attending: Obstetrics & Gynecology | Admitting: Obstetrics & Gynecology

## 2015-02-09 DIAGNOSIS — N898 Other specified noninflammatory disorders of vagina: Secondary | ICD-10-CM | POA: Insufficient documentation

## 2015-02-09 DIAGNOSIS — D649 Anemia, unspecified: Secondary | ICD-10-CM | POA: Diagnosis not present

## 2015-02-09 DIAGNOSIS — M549 Dorsalgia, unspecified: Secondary | ICD-10-CM | POA: Diagnosis present

## 2015-02-09 DIAGNOSIS — R51 Headache: Secondary | ICD-10-CM | POA: Diagnosis present

## 2015-02-09 DIAGNOSIS — D62 Acute posthemorrhagic anemia: Secondary | ICD-10-CM | POA: Diagnosis not present

## 2015-02-09 LAB — URINALYSIS, ROUTINE W REFLEX MICROSCOPIC
Bilirubin Urine: NEGATIVE
Glucose, UA: NEGATIVE mg/dL
Ketones, ur: NEGATIVE mg/dL
Leukocytes, UA: NEGATIVE
NITRITE: NEGATIVE
PH: 5.5 (ref 5.0–8.0)
Protein, ur: NEGATIVE mg/dL
UROBILINOGEN UA: 0.2 mg/dL (ref 0.0–1.0)

## 2015-02-09 LAB — CBC
HCT: 28.4 % — ABNORMAL LOW (ref 36.0–46.0)
Hemoglobin: 9.2 g/dL — ABNORMAL LOW (ref 12.0–15.0)
MCH: 27.6 pg (ref 26.0–34.0)
MCHC: 32.4 g/dL (ref 30.0–36.0)
MCV: 85.3 fL (ref 78.0–100.0)
PLATELETS: 341 10*3/uL (ref 150–400)
RBC: 3.33 MIL/uL — AB (ref 3.87–5.11)
RDW: 14.4 % (ref 11.5–15.5)
WBC: 6.1 10*3/uL (ref 4.0–10.5)

## 2015-02-09 LAB — URINE MICROSCOPIC-ADD ON

## 2015-02-09 LAB — HCG, QUANTITATIVE, PREGNANCY: HCG, BETA CHAIN, QUANT, S: 140 m[IU]/mL — AB (ref <5)

## 2015-02-09 MED ORDER — CYCLOBENZAPRINE HCL 10 MG PO TABS: ORAL_TABLET | ORAL | Status: DC

## 2015-02-09 MED ORDER — ACETAMINOPHEN 325 MG PO TABS
650.0000 mg | ORAL_TABLET | Freq: Once | ORAL | Status: AC
  Administered 2015-02-09: 650 mg via ORAL
  Filled 2015-02-09: qty 2

## 2015-02-09 NOTE — Discharge Instructions (Signed)
Iron Deficiency Anemia °Anemia is when you have a low number of healthy red blood cells. It is often caused by too little iron. This is called iron deficiency anemia. It may make you tired and short of breath. °HOME CARE  °· Take iron as told by your doctor. °· Take vitamins as told by your doctor. °· Eat foods that have iron in them. This includes liver, lean beef, whole-grain bread, eggs, dried fruit, and dark green leafy vegetables. °GET HELP RIGHT AWAY IF: °· You pass out (faint). °· You have chest pain. °· You feel sick to your stomach (nauseous) or throw up (vomit). °· You get very short of breath with activity. °· You are weak. °· You have a fast heartbeat. °· You start to sweat for no reason. °· You become light-headed when getting up from a chair or bed. °MAKE SURE YOU: °· Understand these instructions. °· Will watch your condition. °· Will get help right away if you are not doing well or get worse. °Document Released: 06/19/2010 Document Revised: 05/22/2013 Document Reviewed: 01/22/2013 °[employer]® Patient Information ©2015 [employer]. This information is not intended to replace advice given to you by your health care provider. Make sure you discuss any questions you have with your health care provider. ° °

## 2015-02-09 NOTE — MAU Provider Note (Signed)
History   [id number]   No chief complaint on file.   HPI [facility] is a 34 y.o. female [phone] here for back pain and burning in vaginal area.  Feels she may have a cut or scrape.  Area burns with wiping and urinating.  Upon review of the records patient was seen at [facility] office  for suspected complete miscarriage.  BHCG on that day was 1706. No report of bleeding since that visit other than occasional spotting.  Pt previously screened for STIs, +chlamydia with treatment 01/06/15.  TOC negative.  Patient also reports daily headache since the miscarriage.    Little resolution with ibuprofen.      Patient's last menstrual period was 11/24/2014.  Review of Systems  Constitutional: Negative for fever and chills.  Gastrointestinal: Negative for nausea and vomiting.  Genitourinary: Positive for vaginal pain. Negative for dysuria, vaginal bleeding and pelvic pain.  Musculoskeletal: Positive for back pain (mid lower).  Neurological: Positive for headaches. Negative for dizziness.  All other systems reviewed and are negative.  OB History  Gravida Para Term Preterm AB SAB TAB Ectopic Multiple Living  4 1 1  0 3 1 2   1     # Outcome Date GA Lbr Len/2nd Weight Sex Delivery Anes PTL Lv  4 Term 05/11/99     Vag-Spont     3 SAB           2 TAB           1 TAB               Past Medical History  Diagnosis Date  . Hidradenitis suppurativa     surgical  . Chlamydia contact, treated     Family History  Problem Relation Age of Onset  . Asthma Mother   . Diabetes Father   . Asthma Sister   . Asthma Brother   . Cancer Neg Hx   . Heart disease Neg Hx   . Stroke Neg Hx     Social History   Social History  . Marital Status: Single    Spouse Name: N/A  . Number of Children: N/A  . Years of Education: N/A   Social History Main Topics  . Smoking status: Never Smoker   . Smokeless tobacco: Never Used  . Alcohol Use: Yes     Comment: drinks monthly or less.when not  pregnant   . Drug Use: No  . Sexual Activity: Not Currently    Birth Control/ Protection: None     Comment: last sex Jan 08 2015   Other Topics Concern  . None   Social History Narrative   Lives with 82 yo son.  Unemployed. Not in a relationship.   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 Vicodin [Hydrocodone-Acetaminophen] Nausea Only    No current facility-administered medications on file prior to encounter.   No current outpatient prescriptions on file prior to encounter.     Physical Exam   Filed Vitals:   02/09/15 1927  BP: 130/76  Pulse: 84  Temp: 98.5 F (36.9 C)  TempSrc: Oral  Resp: 18  Height: 5\' 3"  (1.6 m)  Weight: 87.635 kg (193 lb 3.2 oz)  SpO2: 100%    Physical Exam  Constitutional: She is oriented to person, place, and time. She appears well-developed and well-nourished. No distress.  HENT:  Head: Normocephalic.  Neck: Normal range of motion. Neck supple.  Cardiovascular: Normal rate, regular rhythm and normal heart sounds.   Respiratory: Effort normal and breath sounds normal. No respiratory distress.  Genitourinary:     Musculoskeletal: Normal range of motion. She exhibits no edema.  Neurological: She is alert and oriented to person, place, and time. She has normal reflexes.  Skin: Skin is warm and dry.    MAU Course  Procedures  MDM 650 mg Tylenol given HSV culture collected CBC and HCG ordered  Results for orders placed or performed during the hospital encounter of 02/09/15 (from the past 24 hour(s))  Urinalysis, Routine w reflex microscopic (not at [facility])     Status: Abnormal   Collection Time: 02/09/15  7:28 PM  Result Value Ref Range   Color, Urine YELLOW YELLOW   APPearance CLEAR CLEAR   Specific Gravity, Urine <1.005 (L) 1.005 - 1.030   pH 5.5 5.0 - 8.0   Glucose, UA NEGATIVE NEGATIVE mg/dL   Hgb urine dipstick MODERATE (A) NEGATIVE   Bilirubin Urine NEGATIVE  NEGATIVE   Ketones, ur NEGATIVE NEGATIVE mg/dL   Protein, ur NEGATIVE NEGATIVE mg/dL   Urobilinogen, UA 0.2 0.0 - 1.0 mg/dL   Nitrite NEGATIVE NEGATIVE   Leukocytes, UA NEGATIVE NEGATIVE  Urine microscopic-add on     Status: None   Collection Time: 02/09/15  7:28 PM  Result Value Ref Range   Squamous Epithelial / LPF RARE RARE   WBC, UA 0-2 <3 WBC/hpf   RBC / HPF 0-2 <3 RBC/hpf   Bacteria, UA RARE RARE  CBC     Status: Abnormal   Collection Time: 02/09/15  8:35 PM  Result Value Ref Range   WBC 6.1 4.0 - 10.5 K/uL   RBC 3.33 (L) 3.87 - 5.11 MIL/uL   Hemoglobin 9.2 (L) 12.0 - 15.0 g/dL   HCT 28.4 (L) 36.0 - 46.0 %   MCV 85.3 78.0 - 100.0 fL   MCH 27.6 26.0 - 34.0 pg   MCHC 32.4 30.0 - 36.0 g/dL   RDW 14.4 11.5 - 15.5 %   Platelets 341 150 - 400 K/uL   Beta HCG 140  2150 Pt reports improvement in headache; ready for discharge  Assessment and Plan  Vaginal Lesion Anemia S/P Complete SAB  Plan: Discharge to home RX Ferrous Sulfate 325 mg QD HSV pending  Gwen Pounds, CNM 02/09/2015 10:15 PM

## 2015-02-09 NOTE — MAU Note (Signed)
Pt states that she had a miscarriage on 01/29/2015. States that she has had a headache every since then. Has taken ibuprofen around 1300, which did not help. Also has complaints of back pain and burning in vaginal area-does not know if she has a cut or scrape there but feels it when she wipes and urinates.

## 2015-02-11 ENCOUNTER — Telehealth: Payer: Self-pay | Admitting: Family Medicine

## 2015-02-11 NOTE — Telephone Encounter (Signed)
Called to discuss recent HCG test result. It has continued to drop and is now down to 1700. She says that her bleeding and cramping have completely resolved. She did have headaches for which she went to [facility] hospital MAU yesterday. They gave her some medicine which has helped. Will see her back as needed.   CGM MD

## 2015-02-12 LAB — HERPES SIMPLEX VIRUS CULTURE: CULTURE: NOT DETECTED

## 2015-02-14 ENCOUNTER — Telehealth: Payer: Self-pay | Admitting: Advanced Practice Midwife

## 2015-02-14 NOTE — Telephone Encounter (Signed)
Pt called to obtain results of HSV swab. Reported negative results to pt.  Pt to f/u as needed.

## 2015-03-20 ENCOUNTER — Encounter: Payer: Self-pay | Admitting: Family Medicine

## 2015-03-20 ENCOUNTER — Ambulatory Visit (INDEPENDENT_AMBULATORY_CARE_PROVIDER_SITE_OTHER): Payer: Medicaid Other | Admitting: Family Medicine

## 2015-03-20 ENCOUNTER — Other Ambulatory Visit (HOSPITAL_COMMUNITY)
Admission: RE | Admit: 2015-03-20 | Discharge: 2015-03-20 | Disposition: A | Discharge status: Home / Self Care | Payer: Medicaid Other | Source: Ambulatory Visit | Attending: Family Medicine | Admitting: Family Medicine

## 2015-03-20 VITALS — BP 128/74 | HR 80 | Temp 98.8°F | Wt 191.0 lb

## 2015-03-20 DIAGNOSIS — N760 Acute vaginitis: Secondary | ICD-10-CM | POA: Insufficient documentation

## 2015-03-20 DIAGNOSIS — R102 Pelvic and perineal pain: Secondary | ICD-10-CM

## 2015-03-20 DIAGNOSIS — Z113 Encounter for screening for infections with a predominantly sexual mode of transmission: Secondary | ICD-10-CM | POA: Insufficient documentation

## 2015-03-20 LAB — POCT WET PREP (WET MOUNT): CLUE CELLS WET PREP WHIFF POC: POSITIVE

## 2015-03-20 LAB — POCT URINALYSIS DIPSTICK
BILIRUBIN UA: NEGATIVE
Glucose, UA: NEGATIVE
Ketones, UA: NEGATIVE
Leukocytes, UA: NEGATIVE
NITRITE UA: NEGATIVE
PH UA: 7
Protein, UA: NEGATIVE
SPEC GRAV UA: 1.015
UROBILINOGEN UA: 0.2

## 2015-03-20 LAB — POCT UA - MICROSCOPIC ONLY

## 2015-03-20 LAB — POCT URINE PREGNANCY: PREG TEST UR: NEGATIVE

## 2015-03-20 NOTE — Patient Instructions (Signed)
Will call you with results Follow up if not improving or if worsening  Be well, Dr. Ardelia Mems

## 2015-03-21 ENCOUNTER — Telehealth: Payer: Self-pay | Admitting: Family Medicine

## 2015-03-21 ENCOUNTER — Encounter: Payer: Self-pay | Admitting: Family Medicine

## 2015-03-21 DIAGNOSIS — R3129 Other microscopic hematuria: Secondary | ICD-10-CM | POA: Insufficient documentation

## 2015-03-21 LAB — HEPATITIS PANEL, ACUTE
HCV AB: NEGATIVE
HEP B S AG: NEGATIVE
Hep A IgM: NONREACTIVE
Hep B C IgM: NONREACTIVE

## 2015-03-21 LAB — CERVICOVAGINAL ANCILLARY ONLY
CHLAMYDIA, DNA PROBE: NEGATIVE
NEISSERIA GONORRHEA: NEGATIVE
TRICH (WINDOWPATH): NEGATIVE

## 2015-03-21 LAB — RPR

## 2015-03-21 LAB — HIV ANTIBODY (ROUTINE TESTING W REFLEX): HIV 1&2 Ab, 4th Generation: NONREACTIVE

## 2015-03-21 NOTE — Telephone Encounter (Signed)
Called patient to discuss results. Informed of negative STD testing.  Did advise that she have repeat urinalysis & micro done at next office visit since she had some microscopic bacteriuria.  She endorses feeling a little bit better. Patient appreciative.  Leeanne Rio, MD

## 2015-03-21 NOTE — Progress Notes (Signed)
Patient ID: Victoria Rodriguez, female   DOB: [birth year], 34 y.o.   MRN: [id number] Date of Visit: 03/20/2015   HPI:  Pt presents for a same day appointment to discuss midline low abdominal pain.  Pain also radiates to back. Sharp in quality. Middle of lower abdomen in suprapubic area. Ongoing for 4-5 days. Mild in intensity. No dysuria but some irritation in vulvar area. History of hidradenitis and thinks she has a flare ongoing x2 months. stooling well. Urinating slightly more frequently than normal. No fever. Normal PO intake. Mild nausea. LMP Sept 29. No vomiting. dx'd with chlamydia earlier this year, both she and partner were treated. Using condoms sometimes, open to idea of pregnancy. Would like full STD testing today.  ROS: See HPI  [place]: history of hidradenitis. Recent miscarriage.   PHYSICAL EXAM: BP 128/74 mmHg  Pulse 80  Temp(Src) 98.8 F (37.1 C) (Oral)  Wt 191 lb (86.637 kg)  LMP 02/28/2015 Gen: NAD, pleasant, cooperative HEENT: NCAt GU: normal appearing external genitalia. Chronic changes of hidradenitis noted without any areas of acute erythema or fluctuance. Vagina is moist with white discharge. Cervix normal in appearance. No cervical motion tenderness or tenderness on bimanual exam. No adnexal masses.   ASSESSMENT/PLAN:  1. Lower pelvic/abdominal pain: benign pelvic exam today. Pregnancy test negative. Neither urinalysis nor symptoms suggestive of UTI.  - will test for STD's: check gc/chlamydia/trichomonas, HIV, RPR, acute hepatitis panel today  - wet prep with evidence of BV but patient not symptomatic from [place], so she elected to forego treatment - F/u as needed if symptoms worsen or do not improve.   FOLLOW UP: F/u as needed if symptoms worsen or do not improve.   [place]. Ardelia Mems, [place]

## 2015-04-03 ENCOUNTER — Other Ambulatory Visit: Payer: Self-pay | Admitting: Surgery

## 2015-05-14 ENCOUNTER — Encounter: Payer: Self-pay | Admitting: Family Medicine

## 2015-05-14 ENCOUNTER — Ambulatory Visit (INDEPENDENT_AMBULATORY_CARE_PROVIDER_SITE_OTHER): Payer: Medicaid Other | Admitting: Family Medicine

## 2015-05-14 VITALS — BP 123/60 | HR 69 | Temp 98.5°F | Ht 63.0 in | Wt 194.1 lb

## 2015-05-14 DIAGNOSIS — N611 Abscess of the breast and nipple: Secondary | ICD-10-CM | POA: Insufficient documentation

## 2015-05-14 DIAGNOSIS — L732 Hidradenitis suppurativa: Secondary | ICD-10-CM | POA: Diagnosis not present

## 2015-05-14 MED ORDER — SULFAMETHOXAZOLE-TRIMETHOPRIM 800-160 MG PO TABS: 1.0000 | ORAL_TABLET | Freq: Two times a day (BID) | ORAL | Status: DC

## 2015-05-14 NOTE — Patient Instructions (Signed)

## 2015-05-14 NOTE — Assessment & Plan Note (Signed)
2 small indurated areas, 1 draining and the other not fluctuant - no need for I&D, both areas quite small and fluctuance one is draining on its own - start bactrim x2 weeks - ask blackman about antibiotic continuation after surgery, he may want to change

## 2015-05-14 NOTE — Progress Notes (Signed)
   Subjective:   Victoria Rodriguez is a 34 y.o. female with a history of severe hidradenitis here for boil  Pt presents with lump under left breast that has been growing and sore for 1 week and started draining bloody fluid last night. She has recurrent abscesses followed by blackman and has surgery scheduled on her right axilla on Tuesday. She has not had any fevers, chills or other systemic symptoms.  Review of Systems:  Per HPI. All other systems reviewed and are negative.   PMH, PSH, Medications, Allergies, and FmHx reviewed and updated in EMR.  Social History: never smoker  Objective:  BP 123/60 mmHg  Pulse 69  Temp(Src) 98.5 F (36.9 C) (Oral)  Ht 5\' 3"  (1.6 m)  Wt 194 lb 1.6 oz (88.043 kg)  BMI 34.39 kg/m2  LMP 04/23/2015  Gen:  34 y.o. female in NAD HEENT: NCAT, MMM, EOMI, PERRL, anicteric sclerae CV: RRR, no MRG, no JVD Resp: Non-labored, CTAB, no wheezes noted Abd: Soft, NTND, BS present, no guarding or organomegaly Ext: WWP, no edema MSK: Full ROM, strength intact Neuro: Alert and oriented, speech normal Skin: Extensive scarring and chronically draining abscess in right axilla, 2 1cm areas of induration under left breast, medial one is draining and lateral is not but does not have any fluctuance, very little surrounding erythema or swelling      Chemistry      Component Value Date/Time   NA 138 10/17/2014 1028   K 4.0 10/17/2014 1028   CL 102 10/17/2014 1028   CO2 24 10/17/2014 1028   BUN 8 10/17/2014 1028   CREATININE 0.67 10/17/2014 1028   CREATININE 0.52 02/19/2014 1141      Component Value Date/Time   CALCIUM 9.7 10/17/2014 1028   ALKPHOS 74 02/19/2014 1141   AST 13 02/19/2014 1141   ALT 6 02/19/2014 1141   BILITOT 0.2* 02/19/2014 1141      Lab Results  Component Value Date   WBC 6.1 02/09/2015   HGB 9.2* 02/09/2015   HCT 28.4* 02/09/2015   MCV 85.3 02/09/2015   PLT 341 02/09/2015   No results found for: TSH Lab Results  Component Value  Date   HGBA1C 5.4 03/08/2013   Assessment & Plan:     Victoria Rodriguez is a 34 y.o. female here for abscess  Abscess of breast 2 small indurated areas, 1 draining and the other not fluctuant - no need for I&D, both areas quite small and fluctuance one is draining on its own - start bactrim x2 weeks - ask blackman about antibiotic continuation after surgery, he may want to change    Beverlyn Roux, MD, MPH [facility] PGY-3 05/14/2015 9:28 AM

## 2015-05-23 ENCOUNTER — Inpatient Hospital Stay (HOSPITAL_COMMUNITY)
Admission: AD | Admit: 2015-05-23 | Discharge: 2015-05-23 | Disposition: A | Discharge status: Home / Self Care | Payer: Medicaid Other | Source: Ambulatory Visit | Attending: Family Medicine | Admitting: Family Medicine

## 2015-05-23 ENCOUNTER — Encounter (HOSPITAL_COMMUNITY): Payer: Self-pay

## 2015-05-23 ENCOUNTER — Inpatient Hospital Stay (HOSPITAL_COMMUNITY): Payer: Medicaid Other

## 2015-05-23 DIAGNOSIS — B9689 Other specified bacterial agents as the cause of diseases classified elsewhere: Secondary | ICD-10-CM | POA: Diagnosis not present

## 2015-05-23 DIAGNOSIS — R109 Unspecified abdominal pain: Secondary | ICD-10-CM | POA: Diagnosis not present

## 2015-05-23 DIAGNOSIS — Z885 Allergy status to narcotic agent status: Secondary | ICD-10-CM | POA: Diagnosis not present

## 2015-05-23 DIAGNOSIS — O23591 Infection of other part of genital tract in pregnancy, first trimester: Secondary | ICD-10-CM | POA: Insufficient documentation

## 2015-05-23 DIAGNOSIS — Z833 Family history of diabetes mellitus: Secondary | ICD-10-CM | POA: Insufficient documentation

## 2015-05-23 DIAGNOSIS — N760 Acute vaginitis: Secondary | ICD-10-CM | POA: Diagnosis not present

## 2015-05-23 DIAGNOSIS — O9989 Other specified diseases and conditions complicating pregnancy, childbirth and the puerperium: Secondary | ICD-10-CM | POA: Diagnosis not present

## 2015-05-23 DIAGNOSIS — O3680X0 Pregnancy with inconclusive fetal viability, not applicable or unspecified: Secondary | ICD-10-CM

## 2015-05-23 DIAGNOSIS — O26899 Other specified pregnancy related conditions, unspecified trimester: Secondary | ICD-10-CM

## 2015-05-23 DIAGNOSIS — R1030 Lower abdominal pain, unspecified: Secondary | ICD-10-CM | POA: Insufficient documentation

## 2015-05-23 DIAGNOSIS — Z3A01 Less than 8 weeks gestation of pregnancy: Secondary | ICD-10-CM | POA: Insufficient documentation

## 2015-05-23 LAB — WET PREP, GENITAL
Sperm: NONE SEEN
TRICH WET PREP: NONE SEEN
YEAST WET PREP: NONE SEEN

## 2015-05-23 LAB — CBC WITH DIFFERENTIAL/PLATELET
BASOS ABS: 0 10*3/uL (ref 0.0–0.1)
Basophils Relative: 0 %
EOS ABS: 0.1 10*3/uL (ref 0.0–0.7)
EOS PCT: 1 %
HCT: 32 % — ABNORMAL LOW (ref 36.0–46.0)
HEMOGLOBIN: 10.1 g/dL — AB (ref 12.0–15.0)
Lymphocytes Relative: 29 %
Lymphs Abs: 2.3 10*3/uL (ref 0.7–4.0)
MCH: 24.9 pg — ABNORMAL LOW (ref 26.0–34.0)
MCHC: 31.6 g/dL (ref 30.0–36.0)
MCV: 78.8 fL (ref 78.0–100.0)
Monocytes Absolute: 0.5 10*3/uL (ref 0.1–1.0)
Monocytes Relative: 7 %
NEUTROS PCT: 63 %
Neutro Abs: 5.1 10*3/uL (ref 1.7–7.7)
PLATELETS: 375 10*3/uL (ref 150–400)
RBC: 4.06 MIL/uL (ref 3.87–5.11)
RDW: 15.3 % (ref 11.5–15.5)
WBC: 8 10*3/uL (ref 4.0–10.5)

## 2015-05-23 LAB — URINALYSIS, ROUTINE W REFLEX MICROSCOPIC
BILIRUBIN URINE: NEGATIVE
Glucose, UA: NEGATIVE mg/dL
Ketones, ur: NEGATIVE mg/dL
Leukocytes, UA: NEGATIVE
NITRITE: NEGATIVE
PROTEIN: NEGATIVE mg/dL
pH: 5.5 (ref 5.0–8.0)

## 2015-05-23 LAB — POCT PREGNANCY, URINE: PREG TEST UR: POSITIVE — AB

## 2015-05-23 LAB — URINE MICROSCOPIC-ADD ON

## 2015-05-23 LAB — HCG, QUANTITATIVE, PREGNANCY: HCG, BETA CHAIN, QUANT, S: 1166 m[IU]/mL — AB (ref <5)

## 2015-05-23 MED ORDER — METRONIDAZOLE 500 MG PO TABS: 500.0000 mg | ORAL_TABLET | Freq: Two times a day (BID) | ORAL | Status: DC

## 2015-05-23 NOTE — MAU Note (Signed)
Patient presents with c/o abdominal pain and headache for the past 4-5 days.

## 2015-05-23 NOTE — Discharge Instructions (Signed)

## 2015-05-23 NOTE — MAU Provider Note (Signed)
History     CSN: [id number]  Arrival date and time: 05/23/15 1642   First Provider Initiated Contact with Patient 05/23/15 1715      Chief Complaint  Patient presents with  . Abdominal Pain   HPI Ms. Victoria Rodriguez is a 34 y.o. [phone] at [redacted]w[redacted]d who presents to MAU today with complaint of lower abdominal pain x 4-5 days. She rates pain at 6/10 now. She last tried Ibuprofen a few days ago without significant relief. She denies vaginal bleeding, discharge, UTI symptoms, fever or N/V/D. LMP 04/23/15. Patient did not take HPT.   OB History    Gravida Para Term Preterm AB TAB SAB Ectopic Multiple Living   5 1 1  0 3 2 1   1       Past Medical History  Diagnosis Date  . Hidradenitis suppurativa     surgical  . Chlamydia contact, treated     Past Surgical History  Procedure Laterality Date  . Axillary hidradenitis excision  2011    bilateral  . Irrigation and debridement abscess Left 11/16/2013    Procedure: IRRIGATION AND DEBRIDEMENT LEFT AXILLARY ABSCESS;  Surgeon: Pedro Earls, MD;  Location: [facility];  Service: General;  Laterality: Left;  . Hydradenitis excision Left 02/12/2014    Procedure: EXCISION HIDRADENITIS AXILLA;  Surgeon: Coralie Keens, MD;  Location: [facility];  Service: General;  Laterality: Left;  . Dilation and curettage of uterus      Family History  Problem Relation Age of Onset  . Asthma Mother   . Diabetes Father   . Asthma Sister   . Asthma Brother   . Cancer Neg Hx   . Heart disease Neg Hx   . Stroke Neg Hx     Social History  Substance Use Topics  . Smoking status: Never Smoker   . Smokeless tobacco: Never Used  . Alcohol Use: Yes     Comment: drinks monthly or less.when not pregnant     Allergies:  Allergies  Allergen Reactions  . Vicodin [Hydrocodone-Acetaminophen] Nausea Only    Prescriptions prior to admission  Medication Sig Dispense Refill Last Dose  . cyclobenzaprine (FLEXERIL) 10 MG tablet Use QHS for  headaches (Patient not taking: Reported on 05/23/2015) 20 tablet 0 Not Taking at Unknown time  . sulfamethoxazole-trimethoprim (BACTRIM DS,SEPTRA DS) 800-160 MG tablet Take 1 tablet by mouth 2 (two) times daily. (Patient not taking: Reported on 05/23/2015) 28 tablet 0 Not Taking at Unknown time    Review of Systems  Constitutional: Negative for fever and malaise/fatigue.  Gastrointestinal: Positive for abdominal pain. Negative for nausea, vomiting, diarrhea and constipation.  Genitourinary: Negative for dysuria, urgency and frequency.       Neg - vaginal bleeding, discharge   Physical Exam   Blood pressure 119/67, pulse 87, temperature 98.3 F (36.8 C), temperature source Oral, resp. rate 16, height 5\' 3"  (1.6 m), weight 195 lb (88.451 kg), last menstrual period 04/23/2015, unknown if currently breastfeeding.  Physical Exam  Nursing note and vitals reviewed. Constitutional: She is oriented to person, place, and time. She appears well-developed and well-nourished. No distress.  HENT:  Head: Normocephalic and atraumatic.  Cardiovascular: Normal rate.   Respiratory: Effort normal.  GI: Soft. She exhibits no distension and no mass. There is no tenderness. There is no rebound and no guarding.  Genitourinary: Uterus is not enlarged and not tender. Cervix exhibits no motion tenderness, no discharge and no friability. Right adnexum displays no mass and no tenderness.  Left adnexum displays no mass and no tenderness. No bleeding in the vagina. Vaginal discharge (small amount of thin, white discharge noted) found.  Neurological: She is alert and oriented to person, place, and time.  Skin: Skin is warm and dry. No erythema.  Psychiatric: She has a normal mood and affect.   Results for orders placed or performed during the hospital encounter of 05/23/15 (from the past 24 hour(s))  Urinalysis, Routine w reflex microscopic (not at [facility])     Status: Abnormal   Collection Time: 05/23/15  4:55 PM   Result Value Ref Range   Color, Urine YELLOW YELLOW   APPearance CLOUDY (A) CLEAR   Specific Gravity, Urine >1.030 (H) 1.005 - 1.030   pH 5.5 5.0 - 8.0   Glucose, UA NEGATIVE NEGATIVE mg/dL   Hgb urine dipstick MODERATE (A) NEGATIVE   Bilirubin Urine NEGATIVE NEGATIVE   Ketones, ur NEGATIVE NEGATIVE mg/dL   Protein, ur NEGATIVE NEGATIVE mg/dL   Nitrite NEGATIVE NEGATIVE   Leukocytes, UA NEGATIVE NEGATIVE  Urine microscopic-add on     Status: Abnormal   Collection Time: 05/23/15  4:55 PM  Result Value Ref Range   Squamous Epithelial / LPF 0-5 (A) NONE SEEN   WBC, UA 0-5 0 - 5 WBC/hpf   RBC / HPF 0-5 0 - 5 RBC/hpf   Bacteria, UA FEW (A) NONE SEEN   Urine-Other MUCOUS PRESENT   Pregnancy, urine POC     Status: Abnormal   Collection Time: 05/23/15  5:04 PM  Result Value Ref Range   Preg Test, Ur POSITIVE (A) NEGATIVE  Wet prep, genital     Status: Abnormal   Collection Time: 05/23/15  5:22 PM  Result Value Ref Range   Yeast Wet Prep HPF POC NONE SEEN NONE SEEN   Trich, Wet Prep NONE SEEN NONE SEEN   Clue Cells Wet Prep HPF POC PRESENT (A) NONE SEEN   WBC, Wet Prep HPF POC MODERATE (A) NONE SEEN   Sperm NONE SEEN   CBC with Differential/Platelet     Status: Abnormal   Collection Time: 05/23/15  5:23 PM  Result Value Ref Range   WBC 8.0 4.0 - 10.5 K/uL   RBC 4.06 3.87 - 5.11 MIL/uL   Hemoglobin 10.1 (L) 12.0 - 15.0 g/dL   HCT 32.0 (L) 36.0 - 46.0 %   MCV 78.8 78.0 - 100.0 fL   MCH 24.9 (L) 26.0 - 34.0 pg   MCHC 31.6 30.0 - 36.0 g/dL   RDW 15.3 11.5 - 15.5 %   Platelets 375 150 - 400 K/uL   Neutrophils Relative % 63 %   Neutro Abs 5.1 1.7 - 7.7 K/uL   Lymphocytes Relative 29 %   Lymphs Abs 2.3 0.7 - 4.0 K/uL   Monocytes Relative 7 %   Monocytes Absolute 0.5 0.1 - 1.0 K/uL   Eosinophils Relative 1 %   Eosinophils Absolute 0.1 0.0 - 0.7 K/uL   Basophils Relative 0 %   Basophils Absolute 0.0 0.0 - 0.1 K/uL  hCG, quantitative, pregnancy     Status: Abnormal   Collection  Time: 05/23/15  5:23 PM  Result Value Ref Range   hCG, Beta Chain, Quant, S 1166 (H) <5 mIU/mL   US Ob Comp Less 14 Wks  05/23/2015  CLINICAL DATA:  First trimester pregnancy with abdominal pain and headaches for 5 days. Beta HCG levels pending. LMP 04/22/2005) EXAM: OBSTETRIC <14 WK [place] AND TRANSVAGINAL OB US TECHNIQUE: Both transabdominal and transvaginal ultrasound  examinations were performed for complete evaluation of the gestation as well as the maternal uterus, adnexal regions, and pelvic cul-de-sac. Transvaginal technique was performed to assess early pregnancy. COMPARISON:  None. FINDINGS: Intrauterine gestational sac: None visualized. Subchorionic hemorrhage:  None visualized. Maternal uterus/adnexae: Both maternal ovaries are visualized and appear normal. There is a possible corpus luteal cyst on the right. No adnexal mass or free pelvic fluid. Anterior fundal fibroid measures 2.8 x 2.3 x 2.5 cm. IMPRESSION: No intrauterine gestational sac, yolk sac, fetal pole, or cardiac activity visualized. Differential considerations include intrauterine gestation too early to be sonographically visualized, spontaneous abortion, or ectopic pregnancy. Consider follow-up ultrasound in 14 days and serial quantitative beta HCG follow-up. Electronically Signed   By: Richardean Sale M.D.   On: 05/23/2015 18:15   US Ob Transvaginal  05/23/2015  CLINICAL DATA:  First trimester pregnancy with abdominal pain and headaches for 5 days. Beta HCG levels pending. LMP 04/22/2005) EXAM: OBSTETRIC <14 WK [place] AND TRANSVAGINAL OB US TECHNIQUE: Both transabdominal and transvaginal ultrasound examinations were performed for complete evaluation of the gestation as well as the maternal uterus, adnexal regions, and pelvic cul-de-sac. Transvaginal technique was performed to assess early pregnancy. COMPARISON:  None. FINDINGS: Intrauterine gestational sac: None visualized. Subchorionic hemorrhage:  None visualized. Maternal  uterus/adnexae: Both maternal ovaries are visualized and appear normal. There is a possible corpus luteal cyst on the right. No adnexal mass or free pelvic fluid. Anterior fundal fibroid measures 2.8 x 2.3 x 2.5 cm. IMPRESSION: No intrauterine gestational sac, yolk sac, fetal pole, or cardiac activity visualized. Differential considerations include intrauterine gestation too early to be sonographically visualized, spontaneous abortion, or ectopic pregnancy. Consider follow-up ultrasound in 14 days and serial quantitative beta HCG follow-up. Electronically Signed   By: Richardean Sale M.D.   On: 05/23/2015 18:15      MAU Course  Procedures None  MDM +UPT UA, wet prep, GC/chlamydia, CBC, quant hCG, HIV, RPR and [place] today to rule out ectopic pregnancy B+ blood type in Epic from previous visit Urine culture pending  Assessment and Plan  A: Pregnancy of unknown location Abdominal pain in pregnancy Bacterial vaginosis  P: Discharge home Rx for Flagyl given to patient Ectopic precautions discussed Patient advised to follow-up in MAU in 48 hours (Sunday) for repeat labs or sooner if her condition were to change or worsen   Luvenia Redden, PA-C  05/23/2015, 6:44 PM

## 2015-05-24 LAB — RPR: RPR: NONREACTIVE

## 2015-05-24 LAB — HIV ANTIBODY (ROUTINE TESTING W REFLEX): HIV Screen 4th Generation wRfx: NONREACTIVE

## 2015-05-25 ENCOUNTER — Inpatient Hospital Stay (HOSPITAL_COMMUNITY)
Admission: AD | Admit: 2015-05-25 | Discharge: 2015-05-25 | Disposition: A | Discharge status: Home / Self Care | Payer: Medicaid Other | Source: Ambulatory Visit | Attending: Family Medicine | Admitting: Family Medicine

## 2015-05-25 DIAGNOSIS — Z3A01 Less than 8 weeks gestation of pregnancy: Secondary | ICD-10-CM | POA: Insufficient documentation

## 2015-05-25 DIAGNOSIS — R109 Unspecified abdominal pain: Secondary | ICD-10-CM | POA: Diagnosis not present

## 2015-05-25 DIAGNOSIS — Z5189 Encounter for other specified aftercare: Secondary | ICD-10-CM | POA: Diagnosis present

## 2015-05-25 DIAGNOSIS — O9989 Other specified diseases and conditions complicating pregnancy, childbirth and the puerperium: Secondary | ICD-10-CM

## 2015-05-25 DIAGNOSIS — O3680X0 Pregnancy with inconclusive fetal viability, not applicable or unspecified: Secondary | ICD-10-CM

## 2015-05-25 LAB — HCG, QUANTITATIVE, PREGNANCY: HCG, BETA CHAIN, QUANT, S: 2557 m[IU]/mL — AB (ref <5)

## 2015-05-25 NOTE — MAU Note (Signed)
Patient presents to mau for follow up; Denis VB or Pain at this time.

## 2015-05-25 NOTE — Discharge Instructions (Signed)
First Trimester of Pregnancy The first trimester of pregnancy is from week 1 until the end of week 12 (months 1 through 3). A week after a sperm fertilizes an egg, the egg will implant on the wall of the uterus. This embryo will begin to develop into a baby. Genes from you and your partner are forming the baby. The female genes determine whether the baby is a boy or a girl. At 6-8 weeks, the eyes and face are formed, and the heartbeat can be seen on ultrasound. At the end of 12 weeks, all the baby's organs are formed.  Now that you are pregnant, you will want to do everything you can to have a healthy baby. Two of the most important things are to get good prenatal care and to follow your health care provider's instructions. Prenatal care is all the medical care you receive before the baby's birth. This care will help prevent, find, and treat any problems during the pregnancy and childbirth. BODY CHANGES Your body goes through many changes during pregnancy. The changes vary from woman to woman.   You may gain or lose a couple of pounds at first.  You may feel sick to your stomach (nauseous) and throw up (vomit). If the vomiting is uncontrollable, call your health care provider.  You may tire easily.  You may develop headaches that can be relieved by medicines approved by your health care provider.  You may urinate more often. Painful urination may mean you have a bladder infection.  You may develop heartburn as a result of your pregnancy.  You may develop constipation because certain hormones are causing the muscles that push waste through your intestines to slow down.  You may develop hemorrhoids or swollen, bulging veins (varicose veins).  Your breasts may begin to grow larger and become tender. Your nipples may stick out more, and the tissue that surrounds them (areola) may become darker.  Your gums may bleed and may be sensitive to brushing and flossing.  Dark spots or blotches (chloasma,  mask of pregnancy) may develop on your face. This will likely fade after the baby is born.  Your menstrual periods will stop.  You may have a loss of appetite.  You may develop cravings for certain kinds of food.  You may have changes in your emotions from day to day, such as being excited to be pregnant or being concerned that something may go wrong with the pregnancy and baby.  You may have more vivid and strange dreams.  You may have changes in your hair. These can include thickening of your hair, rapid growth, and changes in texture. Some women also have hair loss during or after pregnancy, or hair that feels dry or thin. Your hair will most likely return to normal after your baby is born. WHAT TO EXPECT AT YOUR PRENATAL VISITS During a routine prenatal visit:  You will be weighed to make sure you and the baby are growing normally.  Your blood pressure will be taken.  Your abdomen will be measured to track your baby's growth.  The fetal heartbeat will be listened to starting around week 10 or 12 of your pregnancy.  Test results from any previous visits will be discussed. Your health care provider may ask you:  How you are feeling.  If you are feeling the baby move.  If you have had any abnormal symptoms, such as leaking fluid, bleeding, severe headaches, or abdominal cramping.  If you are using any tobacco products,   including cigarettes, chewing tobacco, and electronic cigarettes.  If you have any questions. Other tests that may be performed during your first trimester include:  Blood tests to find your blood type and to check for the presence of any previous infections. They will also be used to check for low iron levels (anemia) and Rh antibodies. Later in the pregnancy, blood tests for diabetes will be done along with other tests if problems develop.  Urine tests to check for infections, diabetes, or protein in the urine.  An ultrasound to confirm the proper growth  and development of the baby.  An amniocentesis to check for possible genetic problems.  Fetal screens for spina bifida and Down syndrome.  You may need other tests to make sure you and the baby are doing well.  HIV (human immunodeficiency virus) testing. Routine prenatal testing includes screening for HIV, unless you choose not to have this test. HOME CARE INSTRUCTIONS  Medicines  Follow your health care provider's instructions regarding medicine use. Specific medicines may be either safe or unsafe to take during pregnancy.  Take your prenatal vitamins as directed.  If you develop constipation, try taking a stool softener if your health care provider approves. Diet  Eat regular, well-balanced meals. Choose a variety of foods, such as meat or vegetable-based protein, fish, milk and low-fat dairy products, vegetables, fruits, and whole grain breads and cereals. Your health care provider will help you determine the amount of weight gain that is right for you.  Avoid raw meat and uncooked cheese. These carry germs that can cause birth defects in the baby.  Eating four or five small meals rather than three large meals a day may help relieve nausea and vomiting. If you start to feel nauseous, eating a few soda crackers can be helpful. Drinking liquids between meals instead of during meals also seems to help nausea and vomiting.  If you develop constipation, eat more high-fiber foods, such as fresh vegetables or fruit and whole grains. Drink enough fluids to keep your urine clear or pale yellow. Activity and Exercise  Exercise only as directed by your health care provider. Exercising will help you:  Control your weight.  Stay in shape.  Be prepared for labor and delivery.  Experiencing pain or cramping in the lower abdomen or low back is a good sign that you should stop exercising. Check with your health care provider before continuing normal exercises.  Try to avoid standing for long  periods of time. Move your legs often if you must stand in one place for a long time.  Avoid heavy lifting.  Wear low-heeled shoes, and practice good posture.  You may continue to have sex unless your health care provider directs you otherwise. Relief of Pain or Discomfort  Wear a good support bra for breast tenderness.   Take warm sitz baths to soothe any pain or discomfort caused by hemorrhoids. Use hemorrhoid cream if your health care provider approves.   Rest with your legs elevated if you have leg cramps or low back pain.  If you develop varicose veins in your legs, wear support hose. Elevate your feet for 15 minutes, 3-4 times a day. Limit salt in your diet. Prenatal Care  Schedule your prenatal visits by the twelfth week of pregnancy. They are usually scheduled monthly at first, then more often in the last 2 months before delivery.  Write down your questions. Take them to your prenatal visits.  Keep all your prenatal visits as directed by your   health care provider. Safety  Wear your seat belt at all times when driving.  Make a list of emergency phone numbers, including numbers for family, friends, the hospital, and police and fire departments. General Tips  Ask your health care provider for a referral to a local prenatal education class. Begin classes no later than at the beginning of month 6 of your pregnancy.  Ask for help if you have counseling or nutritional needs during pregnancy. Your health care provider can offer advice or refer you to specialists for help with various needs.  Do not use hot tubs, steam rooms, or saunas.  Do not douche or use tampons or scented sanitary pads.  Do not cross your legs for long periods of time.  Avoid cat litter boxes and soil used by cats. These carry germs that can cause birth defects in the baby and possibly loss of the fetus by miscarriage or stillbirth.  Avoid all smoking, herbs, alcohol, and medicines not prescribed by  your health care provider. Chemicals in these affect the formation and growth of the baby.  Do not use any tobacco products, including cigarettes, chewing tobacco, and electronic cigarettes. If you need help quitting, ask your health care provider. You may receive counseling support and other resources to help you quit.  Schedule a dentist appointment. At home, brush your teeth with a soft toothbrush and be gentle when you floss. SEEK MEDICAL CARE IF:   You have dizziness.  You have mild pelvic cramps, pelvic pressure, or nagging pain in the abdominal area.  You have persistent nausea, vomiting, or diarrhea.  You have a bad smelling vaginal discharge.  You have pain with urination.  You notice increased swelling in your face, hands, legs, or ankles. SEEK IMMEDIATE MEDICAL CARE IF:   You have a fever.  You are leaking fluid from your vagina.  You have spotting or bleeding from your vagina.  You have severe abdominal cramping or pain.  You have rapid weight gain or loss.  You vomit blood or material that looks like coffee grounds.  You are exposed to German measles and have never had them.  You are exposed to fifth disease or chickenpox.  You develop a severe headache.  You have shortness of breath.  You have any kind of trauma, such as from a fall or a car accident.   This information is not intended to replace advice given to you by your health care provider. Make sure you discuss any questions you have with your health care provider.   Document Released: 05/11/2001 Document Revised: 06/07/2014 Document Reviewed: 03/27/2013 [employer] Interactive Patient Education 2016 [employer]  

## 2015-05-25 NOTE — MAU Provider Note (Signed)
History   First Provider Initiated Contact with Patient 05/25/15 2110     Chief Complaint:  Follow-up   Victoria Rodriguez is  34 y.o. [id number] Patient's last menstrual period was 04/23/2015.Marland Kitchen Patient is here for follow up of quantitative HCG and ongoing surveillance of pregnancy status.   She is [redacted]w[redacted]d weeks gestation  by LMP.    Since her last visit, the patient is without new complaint.     ROS Abdomin Pain: None Vaginal bleeding: none now.   Passage of clots or tissue: None Dizziness: None  Her previous Quantitative HCG values are:  05/23/15: 1126   Physical Exam   BP 115/64 mmHg  Pulse 74  Temp(Src) 97.8 F (36.6 C) (Oral)  Resp 17  Ht 5\' 3"  (1.6 m)  Wt 195 lb (88.451 kg)  BMI 34.55 kg/m2  SpO2 100%  LMP 04/23/2015 Constitutional: Well-nourished female in no apparent distress. No pallor Neuro: Alert and oriented 4 Cardiovascular: Normal rate Respiratory: Normal effort and rate Abdomen: Soft, nontender Gynecological Exam: examination not indicated  Labs: Results for orders placed or performed during the hospital encounter of 05/25/15 (from the past 24 hour(s))  hCG, quantitative, pregnancy   Collection Time: 05/25/15  7:55 PM  Result Value Ref Range   hCG, Beta Chain, Quant, S 2557 (H) <5 mIU/mL    Ultrasound Studies:   US Ob Comp Less 14 Wks  05/23/2015  CLINICAL DATA:  First trimester pregnancy with abdominal pain and headaches for 5 days. Beta HCG levels pending. LMP 04/22/2005) EXAM: OBSTETRIC <14 WK [place] AND TRANSVAGINAL OB US TECHNIQUE: Both transabdominal and transvaginal ultrasound examinations were performed for complete evaluation of the gestation as well as the maternal uterus, adnexal regions, and pelvic cul-de-sac. Transvaginal technique was performed to assess early pregnancy. COMPARISON:  None. FINDINGS: Intrauterine gestational sac: None visualized. Subchorionic hemorrhage:  None visualized. Maternal uterus/adnexae: Both maternal ovaries are  visualized and appear normal. There is a possible corpus luteal cyst on the right. No adnexal mass or free pelvic fluid. Anterior fundal fibroid measures 2.8 x 2.3 x 2.5 cm. IMPRESSION: No intrauterine gestational sac, yolk sac, fetal pole, or cardiac activity visualized. Differential considerations include intrauterine gestation too early to be sonographically visualized, spontaneous abortion, or ectopic pregnancy. Consider follow-up ultrasound in 14 days and serial quantitative beta HCG follow-up. Electronically Signed   By: Richardean Sale M.D.   On: 05/23/2015 18:15   US Ob Transvaginal  05/23/2015  CLINICAL DATA:  First trimester pregnancy with abdominal pain and headaches for 5 days. Beta HCG levels pending. LMP 04/22/2005) EXAM: OBSTETRIC <14 WK [place] AND TRANSVAGINAL OB US TECHNIQUE: Both transabdominal and transvaginal ultrasound examinations were performed for complete evaluation of the gestation as well as the maternal uterus, adnexal regions, and pelvic cul-de-sac. Transvaginal technique was performed to assess early pregnancy. COMPARISON:  None. FINDINGS: Intrauterine gestational sac: None visualized. Subchorionic hemorrhage:  None visualized. Maternal uterus/adnexae: Both maternal ovaries are visualized and appear normal. There is a possible corpus luteal cyst on the right. No adnexal mass or free pelvic fluid. Anterior fundal fibroid measures 2.8 x 2.3 x 2.5 cm. IMPRESSION: No intrauterine gestational sac, yolk sac, fetal pole, or cardiac activity visualized. Differential considerations include intrauterine gestation too early to be sonographically visualized, spontaneous abortion, or ectopic pregnancy. Consider follow-up ultrasound in 14 days and serial quantitative beta HCG follow-up. Electronically Signed   By: Richardean Sale M.D.   On: 05/23/2015 18:15    MAU course/MDM: Quantitative hCG ordered  Abd pain  in early pregnancy with normal rise in Quant and hemodynamically  stable.  Assessment: [redacted]w[redacted]d weeks gestation w/ Normal rise in Quant Pregnancy of unknown anatomic location  Plan: Discharge home in stable condition. SAB/ectopic precautions F/U US in 1 week MAU PRN if Sx worsen   Medication List    ASK your doctor about these medications        metroNIDAZOLE 500 MG tablet  Commonly known as:  FLAGYL  Take 1 tablet (500 mg total) by mouth 2 (two) times daily.        Manya Silvas, [place] 05/25/2015, 9:08 PM  2/3

## 2015-05-30 LAB — GC/CHLAMYDIA PROBE AMP (~~LOC~~) NOT AT ARMC
Chlamydia: NEGATIVE
Neisseria Gonorrhea: NEGATIVE

## 2015-06-02 ENCOUNTER — Inpatient Hospital Stay (HOSPITAL_COMMUNITY)
Admission: AD | Admit: 2015-06-02 | Discharge: 2015-06-02 | Disposition: A | Discharge status: Home / Self Care | Payer: Medicaid Other | Source: Ambulatory Visit | Attending: Family Medicine | Admitting: Family Medicine

## 2015-06-02 ENCOUNTER — Encounter (HOSPITAL_COMMUNITY): Payer: Self-pay

## 2015-06-02 DIAGNOSIS — J069 Acute upper respiratory infection, unspecified: Secondary | ICD-10-CM | POA: Diagnosis not present

## 2015-06-02 DIAGNOSIS — O9989 Other specified diseases and conditions complicating pregnancy, childbirth and the puerperium: Secondary | ICD-10-CM | POA: Diagnosis not present

## 2015-06-02 DIAGNOSIS — K529 Noninfective gastroenteritis and colitis, unspecified: Secondary | ICD-10-CM | POA: Diagnosis not present

## 2015-06-02 DIAGNOSIS — B349 Viral infection, unspecified: Secondary | ICD-10-CM | POA: Insufficient documentation

## 2015-06-02 DIAGNOSIS — O99611 Diseases of the digestive system complicating pregnancy, first trimester: Secondary | ICD-10-CM

## 2015-06-02 LAB — URINALYSIS, ROUTINE W REFLEX MICROSCOPIC
BILIRUBIN URINE: NEGATIVE
GLUCOSE, UA: NEGATIVE mg/dL
KETONES UR: NEGATIVE mg/dL
Nitrite: NEGATIVE
PH: 5.5 (ref 5.0–8.0)
PROTEIN: NEGATIVE mg/dL
Specific Gravity, Urine: 1.025 (ref 1.005–1.030)

## 2015-06-02 LAB — URINE MICROSCOPIC-ADD ON

## 2015-06-02 NOTE — MAU Provider Note (Signed)
History     CSN: [id number]  Arrival date and time: 06/02/15 [id number]   First Provider Initiated Contact with Patient 06/02/15 [phone]      Chief Complaint  Patient presents with  . Diarrhea  . Nasal Congestion  . Nausea   HPI   Ms.Victoria Rodriguez is a 35 y.o. female [phone] at [redacted]w[redacted]d presenting to MAU with multiple complaints including, diarrhea, congestion, sore throat, neck pain, and nausea for 2 days. She denies fever.  She has had 5 episodes of diarrhea in 24 hours. No one else in the house is sick.   She has not taken anything over the counter for her symptoms.   OB History    Gravida Para Term Preterm AB TAB SAB Ectopic Multiple Living   5 1 1  0 3 2 1   1       Past Medical History  Diagnosis Date  . Hidradenitis suppurativa     surgical  . Chlamydia contact, treated     Past Surgical History  Procedure Laterality Date  . Axillary hidradenitis excision  2011    bilateral  . Irrigation and debridement abscess Left 11/16/2013    Procedure: IRRIGATION AND DEBRIDEMENT LEFT AXILLARY ABSCESS;  Surgeon: Pedro Earls, MD;  Location: [facility];  Service: General;  Laterality: Left;  . Hydradenitis excision Left 02/12/2014    Procedure: EXCISION HIDRADENITIS AXILLA;  Surgeon: Coralie Keens, MD;  Location: [facility];  Service: General;  Laterality: Left;  . Dilation and curettage of uterus      Family History  Problem Relation Age of Onset  . Asthma Mother   . Diabetes Father   . Asthma Sister   . Asthma Brother   . Cancer Neg Hx   . Heart disease Neg Hx   . Stroke Neg Hx     Social History  Substance Use Topics  . Smoking status: Never Smoker   . Smokeless tobacco: Never Used  . Alcohol Use: Yes     Comment: drinks monthly or less.when not pregnant     Allergies:  Allergies  Allergen Reactions  . Vicodin [Hydrocodone-Acetaminophen] Nausea Only    Prescriptions prior to admission  Medication Sig Dispense Refill Last Dose  . metroNIDAZOLE  (FLAGYL) 500 MG tablet Take 1 tablet (500 mg total) by mouth 2 (two) times daily. 14 tablet 0    Results for orders placed or performed during the hospital encounter of 06/02/15 (from the past 48 hour(s))  Urinalysis, Routine w reflex microscopic (not at [facility])     Status: Abnormal   Collection Time: 06/02/15  9:37 AM  Result Value Ref Range   Color, Urine YELLOW YELLOW   APPearance CLEAR CLEAR   Specific Gravity, Urine 1.025 1.005 - 1.030   pH 5.5 5.0 - 8.0   Glucose, UA NEGATIVE NEGATIVE mg/dL   Hgb urine dipstick MODERATE (A) NEGATIVE   Bilirubin Urine NEGATIVE NEGATIVE   Ketones, ur NEGATIVE NEGATIVE mg/dL   Protein, ur NEGATIVE NEGATIVE mg/dL   Nitrite NEGATIVE NEGATIVE   Leukocytes, UA TRACE (A) NEGATIVE  Urine microscopic-add on     Status: Abnormal   Collection Time: 06/02/15  9:37 AM  Result Value Ref Range   Squamous Epithelial / LPF 0-5 (A) NONE SEEN   WBC, UA 0-5 0 - 5 WBC/hpf   RBC / HPF 0-5 0 - 5 RBC/hpf   Bacteria, UA RARE (A) NONE SEEN   Urine-Other MUCOUS PRESENT     Review of Systems  Constitutional:  Positive for chills. Negative for fever.  HENT: Positive for congestion.   Respiratory: Positive for cough.   Gastrointestinal: Positive for nausea and diarrhea. Negative for vomiting.  Neurological: Negative for headaches.   Physical Exam   Blood pressure 115/72, pulse 101, temperature 98.3 F (36.8 C), temperature source Oral, resp. rate 18, last menstrual period 04/23/2015, unknown if currently breastfeeding.  Physical Exam  Constitutional: She is oriented to person, place, and time. She appears well-developed and well-nourished. No distress.  HENT:  Head: Normocephalic.  Eyes: Pupils are equal, round, and reactive to light.  Neck: Trachea normal, normal range of motion and full passive range of motion without pain. Neck supple. No JVD present. No tracheal tenderness, no spinous process tenderness and no muscular tenderness present. No rigidity. No edema,  no erythema and normal range of motion present. No Kernig's sign noted. No thyroid mass and no thyromegaly present.  Respiratory: Effort normal.  Musculoskeletal: Normal range of motion.  Neurological: She is alert and oriented to person, place, and time.  Skin: Skin is warm. She is not diaphoretic.  Psychiatric: Her behavior is normal.    MAU Course  Procedures  None  MDM  UA  Assessment and Plan   A:  1. Gastroenteritis, acute   2. Upper respiratory virus     P:  Discharge home in stable condition This is a highly contagious virus and needs to run its course. If patient spikes a temperature >100.4 she may need to be flu tested Return to MAU if symptoms worsen Increase PO fluids Avoid public places A list of over the counter medications given that are safe to take in pregnancy. Cool mist humidifier     Lezlie Lye, NP 06/02/2015 10:40 AM

## 2015-06-02 NOTE — Discharge Instructions (Signed)
Cool Mist Vaporizers  Vaporizers may help relieve the symptoms of a cough and cold. They add moisture to the air, which helps mucus to become thinner and less sticky. This makes it easier to breathe and cough up secretions. Cool mist vaporizers do not cause serious burns like hot mist vaporizers, which may also be called steamers or humidifiers. Vaporizers have not been proven to help with colds. You should not use a vaporizer if you are allergic to mold.  HOME CARE INSTRUCTIONS  · Follow the package instructions for the vaporizer.  · Do not use anything other than distilled water in the vaporizer.  · Do not run the vaporizer all of the time. This can cause mold or bacteria to grow in the vaporizer.  · Clean the vaporizer after each time it is used.  · Clean and dry the vaporizer well before storing it.  · Stop using the vaporizer if worsening respiratory symptoms develop.     This information is not intended to replace advice given to you by your health care provider. Make sure you discuss any questions you have with your health care provider.     Document Released: 02/12/2004 Document Revised: 05/22/2013 Document Reviewed: 10/04/2012  Elsevier Interactive Patient Education ©2016 [employer]

## 2015-06-02 NOTE — MAU Note (Signed)
Pt C/O diarrhea, nausea, congestion, top of her mouth feels numb, neck pain - for the last 2 days.  Pt is on flagyl for BV.  Denies abd pain or bleeding.  Has had approx 5 diarrhea stools in the last 24 hours.

## 2015-06-03 ENCOUNTER — Ambulatory Visit (INDEPENDENT_AMBULATORY_CARE_PROVIDER_SITE_OTHER): Payer: Medicaid Other | Admitting: Advanced Practice Midwife

## 2015-06-03 ENCOUNTER — Encounter: Payer: Self-pay | Admitting: Advanced Practice Midwife

## 2015-06-03 ENCOUNTER — Ambulatory Visit (HOSPITAL_COMMUNITY)
Admission: RE | Admit: 2015-06-03 | Discharge: 2015-06-03 | Disposition: A | Discharge status: Home / Self Care | Payer: Medicaid Other | Source: Ambulatory Visit | Attending: Advanced Practice Midwife | Admitting: Advanced Practice Midwife

## 2015-06-03 DIAGNOSIS — Z3491 Encounter for supervision of normal pregnancy, unspecified, first trimester: Secondary | ICD-10-CM

## 2015-06-03 DIAGNOSIS — O3411 Maternal care for benign tumor of corpus uteri, first trimester: Secondary | ICD-10-CM | POA: Insufficient documentation

## 2015-06-03 DIAGNOSIS — Z3A01 Less than 8 weeks gestation of pregnancy: Secondary | ICD-10-CM | POA: Diagnosis not present

## 2015-06-03 DIAGNOSIS — A084 Viral intestinal infection, unspecified: Secondary | ICD-10-CM

## 2015-06-03 DIAGNOSIS — Z3490 Encounter for supervision of normal pregnancy, unspecified, unspecified trimester: Secondary | ICD-10-CM | POA: Insufficient documentation

## 2015-06-03 DIAGNOSIS — D259 Leiomyoma of uterus, unspecified: Secondary | ICD-10-CM | POA: Insufficient documentation

## 2015-06-03 DIAGNOSIS — O3680X0 Pregnancy with inconclusive fetal viability, not applicable or unspecified: Secondary | ICD-10-CM

## 2015-06-03 DIAGNOSIS — Z36 Encounter for antenatal screening of mother: Secondary | ICD-10-CM | POA: Diagnosis not present

## 2015-06-03 LAB — URINE CULTURE: Culture: 2000

## 2015-06-03 NOTE — Progress Notes (Signed)
S: 35 y.o. [id number] @[redacted]w[redacted]d  by LMP/U/S presents to clinic for follow up results after scheduled outpatient ultrasound. She initially presented on 06/01/14 with report of abdominal pain in pregnancy, along with n/v/d with diagnosis of viral gastroenteritis.  She denies pain or bleeding today.  Her n/v/d symptoms are improved today also. She denies fever/chills.  HPI  Review of Systems  Constitutional: Negative for fever, chills and malaise/fatigue.  Eyes: Negative for blurred vision.  Respiratory: Positive for sputum production. Negative for cough and shortness of breath.   Cardiovascular: Negative for chest pain.  Gastrointestinal: Positive for nausea. Negative for heartburn, vomiting and abdominal pain.  Genitourinary: Negative for dysuria, urgency and frequency.  Musculoskeletal: Negative.   Neurological: Negative for dizziness and headaches.  Psychiatric/Behavioral: Negative for depression.    O: LMP 04/23/2015 US Ob Transvaginal  06/03/2015  CLINICAL DATA:  Followup abnormal ultrasound. EXAM: TRANSVAGINAL OB ULTRASOUND<Procedure Description>TRANSVAGINAL OB ULTRASOUND TECHNIQUE: Transvaginal ultrasound was performed for complete evaluation of the gestation as well as the maternal uterus, adnexal regions, and pelvic cul-de-sac. COMPARISON:  05/23/2015. FINDINGS: Intrauterine gestational sac:  Visualized/normal in shape. Yolk sac:  Yes Embryo:  Yes Cardiac Activity: Yes Heart Rate: 110 bpm CRL:   4.1  mm   6 w 1 d                  [place] EDC: 01/26/2016 Maternal uterus/adnexae: Subchorionic hemorrhage: None Right ovary: Normal Left ovary: Normal Other :Anterior fibroid is noted measuring 3.6 x 3.3 x 3.1 cm. Free fluid:  No free fluid. IMPRESSION: 1. Single living intrauterine gestation. The estimated gestational age is 55 weeks and 1 day 2. Anterior uterine fibroid. Electronically Signed   By: Kerby Moors M.D.   On: 06/03/2015 08:48   B/POS/-- (08/18 1034) VS reviewed, nursing note reviewed,   Constitutional: well developed, well nourished, no distress HEENT: normocephalic CV: normal rate Pulm/chest wall: normal effort Abdomen: soft Neuro: alert and oriented x 3 Skin: warm, dry Psych: affect normal  MDM: Reviewed today's [place] results.   Pt has IUP with dates consistent with LMP.  Pt given routine first trimester precautions.  Pt to follow up with early prenatal care. Pt stable at time of discharge.  A:  1. Normal IUP (intrauterine pregnancy) on prenatal ultrasound, first trimester   2. Viral gastroenteritis     P: D/C home Follow up as planned with Dr Fay Records, Loreena Valeri, CNM 9:36 AM

## 2015-06-08 ENCOUNTER — Inpatient Hospital Stay (HOSPITAL_COMMUNITY)
Admission: AD | Admit: 2015-06-08 | Discharge: 2015-06-09 | Disposition: A | Discharge status: Home / Self Care | Payer: Medicaid Other | Source: Ambulatory Visit | Attending: Obstetrics and Gynecology | Admitting: Obstetrics and Gynecology

## 2015-06-08 DIAGNOSIS — B3731 Acute candidiasis of vulva and vagina: Secondary | ICD-10-CM

## 2015-06-08 DIAGNOSIS — B373 Candidiasis of vulva and vagina: Secondary | ICD-10-CM | POA: Diagnosis not present

## 2015-06-08 DIAGNOSIS — L292 Pruritus vulvae: Secondary | ICD-10-CM | POA: Diagnosis present

## 2015-06-08 DIAGNOSIS — O9A211 Injury, poisoning and certain other consequences of external causes complicating pregnancy, first trimester: Secondary | ICD-10-CM | POA: Diagnosis not present

## 2015-06-09 ENCOUNTER — Encounter (HOSPITAL_COMMUNITY): Payer: Self-pay | Admitting: *Deleted

## 2015-06-09 ENCOUNTER — Emergency Department (HOSPITAL_COMMUNITY)
Admission: EM | Admit: 2015-06-09 | Discharge: 2015-06-09 | Disposition: A | Discharge status: Home / Self Care | Payer: No Typology Code available for payment source | Attending: Emergency Medicine | Admitting: Emergency Medicine

## 2015-06-09 DIAGNOSIS — Z8619 Personal history of other infectious and parasitic diseases: Secondary | ICD-10-CM | POA: Diagnosis not present

## 2015-06-09 DIAGNOSIS — B373 Candidiasis of vulva and vagina: Secondary | ICD-10-CM | POA: Diagnosis not present

## 2015-06-09 DIAGNOSIS — Z3A01 Less than 8 weeks gestation of pregnancy: Secondary | ICD-10-CM | POA: Diagnosis not present

## 2015-06-09 DIAGNOSIS — Y9389 Activity, other specified: Secondary | ICD-10-CM | POA: Diagnosis not present

## 2015-06-09 DIAGNOSIS — Y92410 Unspecified street and highway as the place of occurrence of the external cause: Secondary | ICD-10-CM | POA: Insufficient documentation

## 2015-06-09 DIAGNOSIS — S199XXA Unspecified injury of neck, initial encounter: Secondary | ICD-10-CM | POA: Diagnosis not present

## 2015-06-09 DIAGNOSIS — Y998 Other external cause status: Secondary | ICD-10-CM | POA: Diagnosis not present

## 2015-06-09 DIAGNOSIS — Z872 Personal history of diseases of the skin and subcutaneous tissue: Secondary | ICD-10-CM | POA: Insufficient documentation

## 2015-06-09 DIAGNOSIS — O9A211 Injury, poisoning and certain other consequences of external causes complicating pregnancy, first trimester: Secondary | ICD-10-CM | POA: Diagnosis present

## 2015-06-09 DIAGNOSIS — S3992XA Unspecified injury of lower back, initial encounter: Secondary | ICD-10-CM | POA: Diagnosis not present

## 2015-06-09 DIAGNOSIS — M545 Low back pain, unspecified: Secondary | ICD-10-CM

## 2015-06-09 LAB — URINALYSIS, ROUTINE W REFLEX MICROSCOPIC
BILIRUBIN URINE: NEGATIVE
Glucose, UA: NEGATIVE mg/dL
KETONES UR: NEGATIVE mg/dL
NITRITE: NEGATIVE
PH: 6 (ref 5.0–8.0)
PROTEIN: NEGATIVE mg/dL
Specific Gravity, Urine: 1.025 (ref 1.005–1.030)

## 2015-06-09 LAB — WET PREP, GENITAL
CLUE CELLS WET PREP: NONE SEEN
Sperm: NONE SEEN
TRICH WET PREP: NONE SEEN

## 2015-06-09 LAB — URINE MICROSCOPIC-ADD ON

## 2015-06-09 LAB — GC/CHLAMYDIA PROBE AMP (~~LOC~~) NOT AT ARMC
CHLAMYDIA, DNA PROBE: NEGATIVE
NEISSERIA GONORRHEA: NEGATIVE

## 2015-06-09 MED ORDER — ACETAMINOPHEN 325 MG PO TABS
650.0000 mg | ORAL_TABLET | Freq: Once | ORAL | Status: AC
  Administered 2015-06-09: 650 mg via ORAL
  Filled 2015-06-09: qty 2

## 2015-06-09 MED ORDER — TERCONAZOLE 0.4 % VA CREA: 1.0000 | TOPICAL_CREAM | Freq: Every day | VAGINAL | Status: DC

## 2015-06-09 NOTE — ED Notes (Signed)
Declined W/C at D/C and was escorted to lobby by RN. 

## 2015-06-09 NOTE — MAU Provider Note (Signed)
History     CSN: [id number]  Arrival date and time: 06/08/15 2352   First Provider Initiated Contact with Patient 06/09/15 0021      Chief Complaint  Patient presents with  . Vaginal Itching   Vaginal Itching The patient's primary symptoms include genital itching. This is a new problem. The current episode started in the past 7 days. The problem occurs constantly. The problem has been unchanged. The patient is experiencing no pain. She is pregnant. Associated symptoms include dysuria. Pertinent negatives include no abdominal pain, chills, constipation, diarrhea, fever, frequency, nausea, urgency or vomiting. Nothing (finished antibiotics for BV recently, and itching started soon after. ) aggravates the symptoms. She has tried nothing for the symptoms. She is sexually active. It is unknown whether or not her partner has an STD. She uses nothing for contraception.    Past Medical History  Diagnosis Date  . Hidradenitis suppurativa     surgical  . Chlamydia contact, treated     Past Surgical History  Procedure Laterality Date  . Axillary hidradenitis excision  2011    bilateral  . Irrigation and debridement abscess Left 11/16/2013    Procedure: IRRIGATION AND DEBRIDEMENT LEFT AXILLARY ABSCESS;  Surgeon: Pedro Earls, MD;  Location: [facility];  Service: General;  Laterality: Left;  . Hydradenitis excision Left 02/12/2014    Procedure: EXCISION HIDRADENITIS AXILLA;  Surgeon: Coralie Keens, MD;  Location: [place];  Service: General;  Laterality: Left;  . Dilation and curettage of uterus      Family History  Problem Relation Age of Onset  . Asthma Mother   . Diabetes Father   . Asthma Sister   . Asthma Brother   . Cancer Neg Hx   . Heart disease Neg Hx   . Stroke Neg Hx     Social History  Substance Use Topics  . Smoking status: Never Smoker   . Smokeless tobacco: Never Used  . Alcohol Use: No     Comment: drinks monthly or less.when not pregnant      Allergies:  Allergies  Allergen Reactions  . Vicodin [Hydrocodone-Acetaminophen] Nausea Only    Prescriptions prior to admission  Medication Sig Dispense Refill Last Dose  . metroNIDAZOLE (FLAGYL) 500 MG tablet Take 1 tablet (500 mg total) by mouth 2 (two) times daily. 14 tablet 0     Review of Systems  Constitutional: Negative for fever and chills.  Gastrointestinal: Negative for nausea, vomiting, abdominal pain, diarrhea and constipation.  Genitourinary: Positive for dysuria. Negative for urgency and frequency.   Physical Exam   Blood pressure 119/63, pulse 79, temperature 98.6 F (37 C), temperature source Oral, resp. rate 18, last menstrual period 04/23/2015, SpO2 100 %, unknown if currently breastfeeding.  Physical Exam  Nursing note and vitals reviewed. Constitutional: She is oriented to person, place, and time. She appears well-developed and well-nourished. No distress.  Cardiovascular: Normal rate.   Respiratory: Effort normal.  GI: Soft.  Genitourinary:   External: no lesion Vagina: large amount of thick, white discharge Cervix: pink, smooth, no CMT   Neurological: She is alert and oriented to person, place, and time.  Skin: Skin is warm and dry.  Psychiatric: She has a normal mood and affect.   Results for orders placed or performed during the hospital encounter of 06/08/15 (from the past 24 hour(s))  Urinalysis, Routine w reflex microscopic (not at [facility])     Status: Abnormal   Collection Time: 06/09/15 12:09 AM  Result Value Ref  Range   Color, Urine YELLOW YELLOW   APPearance CLEAR CLEAR   Specific Gravity, Urine 1.025 1.005 - 1.030   pH 6.0 5.0 - 8.0   Glucose, UA NEGATIVE NEGATIVE mg/dL   Hgb urine dipstick LARGE (A) NEGATIVE   Bilirubin Urine NEGATIVE NEGATIVE   Ketones, ur NEGATIVE NEGATIVE mg/dL   Protein, ur NEGATIVE NEGATIVE mg/dL   Nitrite NEGATIVE NEGATIVE   Leukocytes, UA MODERATE (A) NEGATIVE  Urine microscopic-add on     Status:  Abnormal   Collection Time: 06/09/15 12:09 AM  Result Value Ref Range   Squamous Epithelial / LPF 0-5 (A) NONE SEEN   WBC, UA 6-30 0 - 5 WBC/hpf   RBC / HPF 6-30 0 - 5 RBC/hpf   Bacteria, UA FEW (A) NONE SEEN  Wet prep, genital     Status: Abnormal   Collection Time: 06/09/15 12:30 AM  Result Value Ref Range   Yeast Wet Prep HPF POC PRESENT (A) NONE SEEN   Trich, Wet Prep NONE SEEN NONE SEEN   Clue Cells Wet Prep HPF POC NONE SEEN NONE SEEN   WBC, Wet Prep HPF POC MODERATE (A) NONE SEEN   Sperm NONE SEEN     MAU Course  Procedures  MDM   Assessment and Plan   1. Yeast infection of the vagina    DC home Comfort measures reviewed  1st Trimester precautions  Bleeding precautions RX: terazol 7 #1 Return to MAU as needed FU with OB as planned  Follow-up Information    Follow up with Frederico Hamman, MD.   Specialty:  Obstetrics and Gynecology   Why:  As scheduled   Contact information:   [address] [place] [address] [phone]         Mathis Bud 06/09/2015, 12:22 AM

## 2015-06-09 NOTE — Discharge Instructions (Signed)
Ms. Victoria Rodriguez,  Nice meeting you! Please follow-up with your obstetrician. Return to the emergency department if you develop difficulty walking, loss of bladder/bowel control, shortness of breath, chest pain. Feel better soon!  S. Wendie Simmer, PA-C   Back Pain in Pregnancy Back pain during pregnancy is common. It happens in about half of all pregnancies. It is important for you and your baby that you remain active during your pregnancy.If you feel that back pain is not allowing you to remain active or sleep well, it is time to see your caregiver. Back pain may be caused by several factors related to changes during your pregnancy.Fortunately, unless you had trouble with your back before your pregnancy, the pain is likely to get better after you deliver. Low back pain usually occurs between the fifth and seventh months of pregnancy. It can, however, happen in the first couple months. Factors that increase the risk of back problems include:   Previous back problems.  Injury to your back.  Having twins or multiple births.  A chronic cough.  Stress.  Job-related repetitive motions.  Muscle or spinal disease in the back.  Family history of back problems, ruptured (herniated) discs, or osteoporosis.  Depression, anxiety, and panic attacks. CAUSES   When you are pregnant, your body produces a hormone called relaxin. This hormonemakes the ligaments connecting the low back and pubic bones more flexible. This flexibility allows the baby to be delivered more easily. When your ligaments are loose, your muscles need to work harder to support your back. Soreness in your back can come from tired muscles. Soreness can also come from back tissues that are irritated since they are receiving less support.  As the baby grows, it puts pressure on the nerves and blood vessels in your pelvis. This can cause back pain.  As the baby grows and gets heavier during pregnancy, the uterus pushes the  stomach muscles forward and changes your center of gravity. This makes your back muscles work harder to maintain good posture. SYMPTOMS  Lumbar pain during pregnancy Lumbar pain during pregnancy usually occurs at or above the waist in the center of the back. There may be pain and numbness that radiates into your leg or foot. This is similar to low back pain experienced by non-pregnant women. It usually increases with sitting for long periods of time, standing, or repetitive lifting. Tenderness may also be present in the muscles along your upper back. Posterior pelvic pain during pregnancy Pain in the back of the pelvis is more common than lumbar pain in pregnancy. It is a deep pain felt in your side at the waistline, or across the tailbone (sacrum), or in both places. You may have pain on one or both sides. This pain can also go into the buttocks and backs of the upper thighs. Pubic and groin pain may also be present. The pain does not quickly resolve with rest, and morning stiffness may also be present. Pelvic pain during pregnancy can be brought on by most activities. A high level of fitness before and during pregnancy may or may not prevent this problem. Labor pain is usually 1 to 2 minutes apart, lasts for about 1 minute, and involves a bearing down feeling or pressure in your pelvis. However, if you are at term with the pregnancy, constant low back pain can be the beginning of early labor, and you should be aware of this. DIAGNOSIS  X-rays of the back should not be done during the first 12  to 14 weeks of the pregnancy and only when absolutely necessary during the rest of the pregnancy. MRIs do not give off radiation and are safe during pregnancy. MRIs also should only be done when absolutely necessary. HOME CARE INSTRUCTIONS  Exercise as directed by your caregiver. Exercise is the most effective way to prevent or manage back pain. If you have a back problem, it is especially important to avoid sports  that require sudden body movements. Swimming and walking are great activities.  Do not stand in one place for long periods of time.  Do not wear high heels.  Sit in chairs with good posture. Use a pillow on your lower back if necessary. Make sure your head rests over your shoulders and is not hanging forward.  Try sleeping on your side, preferably the left side, with a pillow or two between your legs. If you are sore after a night's rest, your bedmay betoo soft.Try placing a board between your mattress and box spring.  Listen to your body when lifting.If you are experiencing pain, ask for help or try bending yourknees more so you can use your leg muscles rather than your back muscles. Squat down when picking up something from the floor. Do not bend over.  Eat a healthy diet. Try to gain weight within your caregiver's recommendations.  Use heat or cold packs 3 to 4 times a day for 15 minutes to help with the pain.  Only take over-the-counter or prescription medicines for pain, discomfort, or fever as directed by your caregiver. Sudden (acute) back pain  Use bed rest for only the most extreme, acute episodes of back pain. Prolonged bed rest over 48 hours will aggravate your condition.  Ice is very effective for acute conditions.  Put ice in a plastic bag.  Place a towel between your skin and the bag.  Leave the ice on for 10 to 20 minutes every 2 hours, or as needed.  Using heat packs for 30 minutes prior to activities is also helpful. Continued back pain See your caregiver if you have continued problems. Your caregiver can help or refer you for appropriate physical therapy. With conditioning, most back problems can be avoided. Sometimes, a more serious issue may be the cause of back pain. You should be seen right away if new problems seem to be developing. Your caregiver may recommend:  A maternity girdle.  An elastic sling.  A back brace.  A massage therapist or  acupuncture. SEEK MEDICAL CARE IF:   You are not able to do most of your daily activities, even when taking the pain medicine you were given.  You need a referral to a physical therapist or chiropractor.  You want to try acupuncture. SEEK IMMEDIATE MEDICAL CARE IF:  You develop numbness, tingling, weakness, or problems with the use of your arms or legs.  You develop severe back pain that is no longer relieved with medicines.  You have a sudden change in bowel or bladder control.  You have increasing pain in other areas of the body.  You develop shortness of breath, dizziness, or fainting.  You develop nausea, vomiting, or sweating.  You have back pain which is similar to labor pains.  You have back pain along with your water breaking or vaginal bleeding.  You have back pain or numbness that travels down your leg.  Your back pain developed after you fell.  You develop pain on one side of your back. You may have a kidney stone.  You see blood in your urine. You may have a bladder infection or kidney stone.  You have back pain with blisters. You may have shingles. Back pain is fairly common during pregnancy but should not be accepted as just part of the process. Back pain should always be treated as soon as possible. This will make your pregnancy as pleasant as possible.   This information is not intended to replace advice given to you by your health care provider. Make sure you discuss any questions you have with your health care provider.   Document Released: 08/25/2005 Document Revised: 08/09/2011 Document Reviewed: 10/06/2010 Elsevier Interactive Patient Education [employer].

## 2015-06-09 NOTE — MAU Note (Signed)
Pt presents with complaint of vaginal itching x 3 days, now has external irritation and burning with urination.
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
## 2015-06-09 NOTE — Discharge Instructions (Signed)

## 2015-06-09 NOTE — MAU Note (Signed)
Treated for BV on 05/23/2015. Pt finished meds and has now been itching severely for 3 days. Now she is having dysuria and is extremely uncomfortable. Denies VB and discharge.
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
## 2015-06-12 LAB — OB RESULTS CONSOLE RUBELLA ANTIBODY, IGM: RUBELLA: IMMUNE

## 2015-06-12 LAB — OB RESULTS CONSOLE ABO/RH: RH TYPE: POSITIVE

## 2015-06-12 LAB — SICKLE CELL SCREEN: Sickle Cell Screen: NEGATIVE

## 2015-06-12 LAB — OB RESULTS CONSOLE HEPATITIS B SURFACE ANTIGEN: Hepatitis B Surface Ag: NEGATIVE

## 2015-06-12 LAB — OB RESULTS CONSOLE GC/CHLAMYDIA
Chlamydia: NEGATIVE
Gonorrhea: NEGATIVE

## 2015-06-12 LAB — OB RESULTS CONSOLE RPR: RPR: NONREACTIVE

## 2015-06-12 LAB — OB RESULTS CONSOLE PLATELET COUNT: PLATELETS: 446 10*3/uL

## 2015-06-12 LAB — OB RESULTS CONSOLE HGB/HCT, BLOOD
HCT: 34 %
Hemoglobin: 10.5 g/dL

## 2015-06-12 LAB — OB RESULTS CONSOLE HIV ANTIBODY (ROUTINE TESTING): HIV: NONREACTIVE

## 2015-06-12 LAB — OB RESULTS CONSOLE ANTIBODY SCREEN: ANTIBODY SCREEN: NEGATIVE

## 2015-06-17 IMAGING — CR DG LUMBAR SPINE 2-3V
3 series · 3 of 3 positions shown · non-contrast
Comparison: 05/24/2012

CLINICAL DATA: Low back pain for 1 month

LUMBAR SPINE - 2-3 VIEW

[view not recorded (1 of 3)]
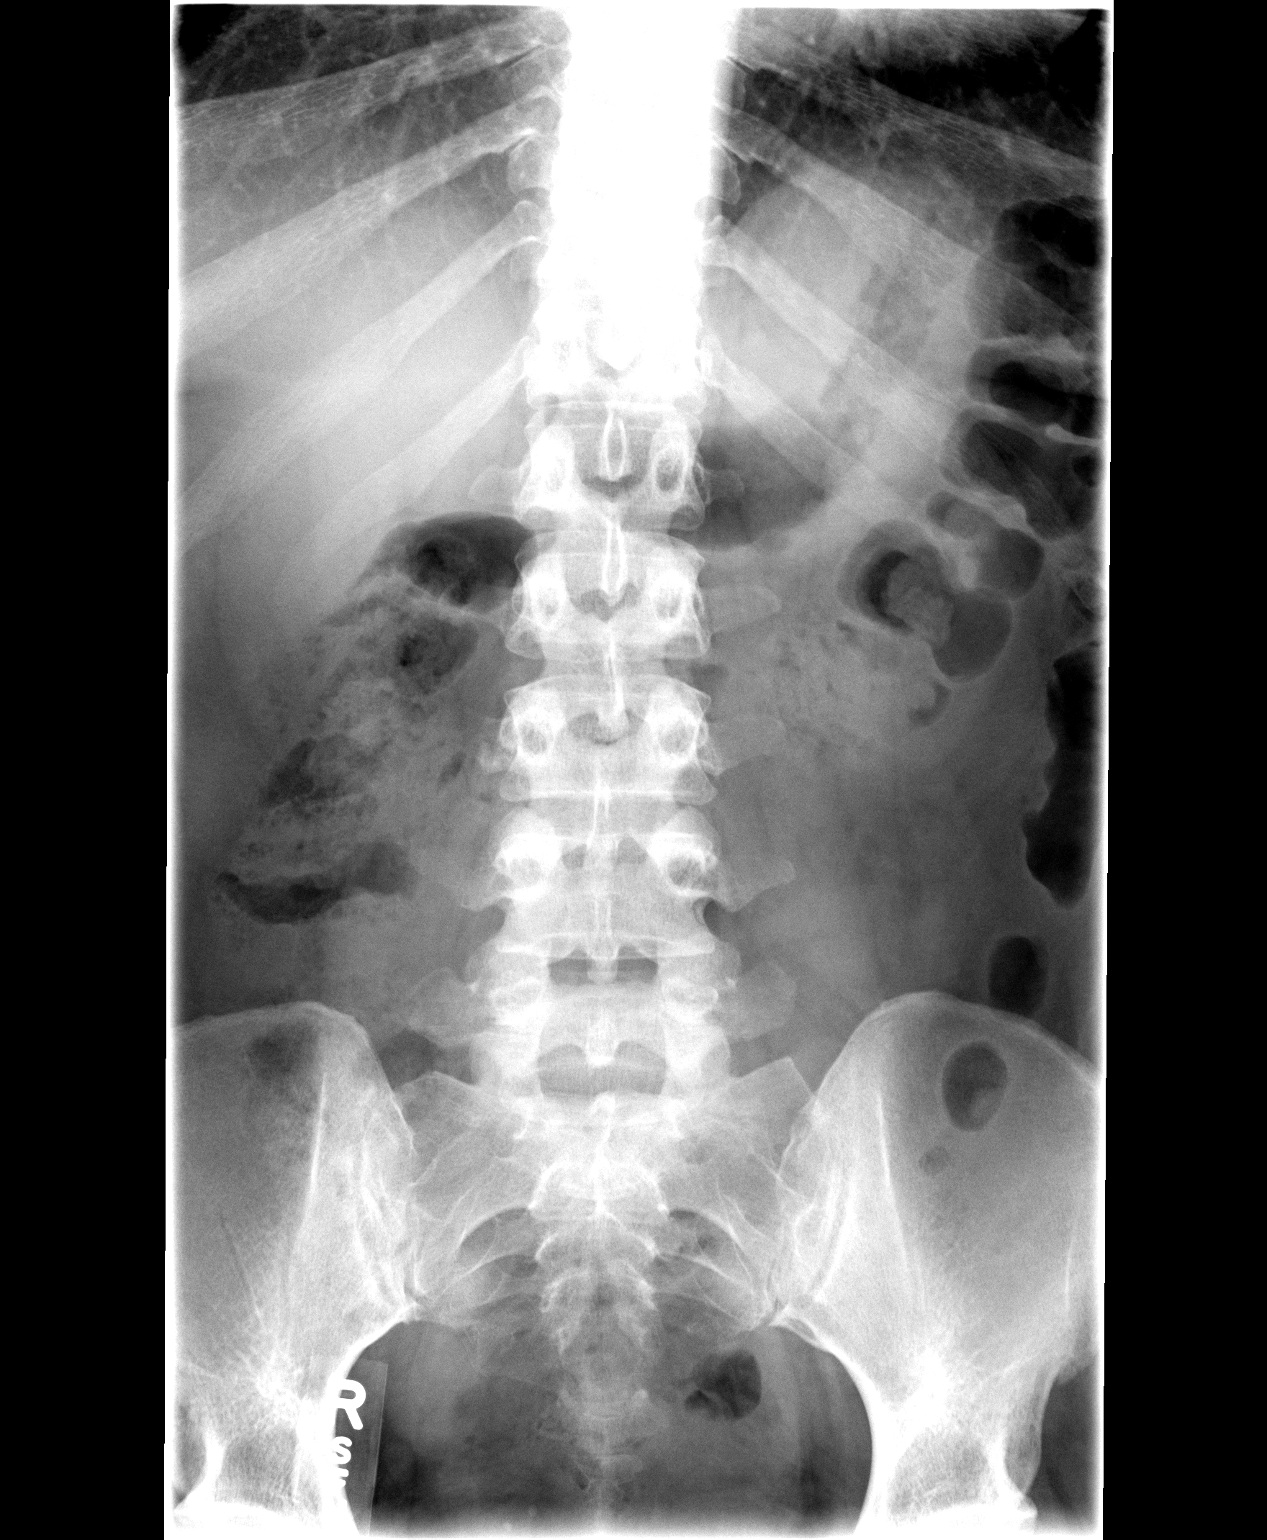

[view not recorded (2 of 3)]
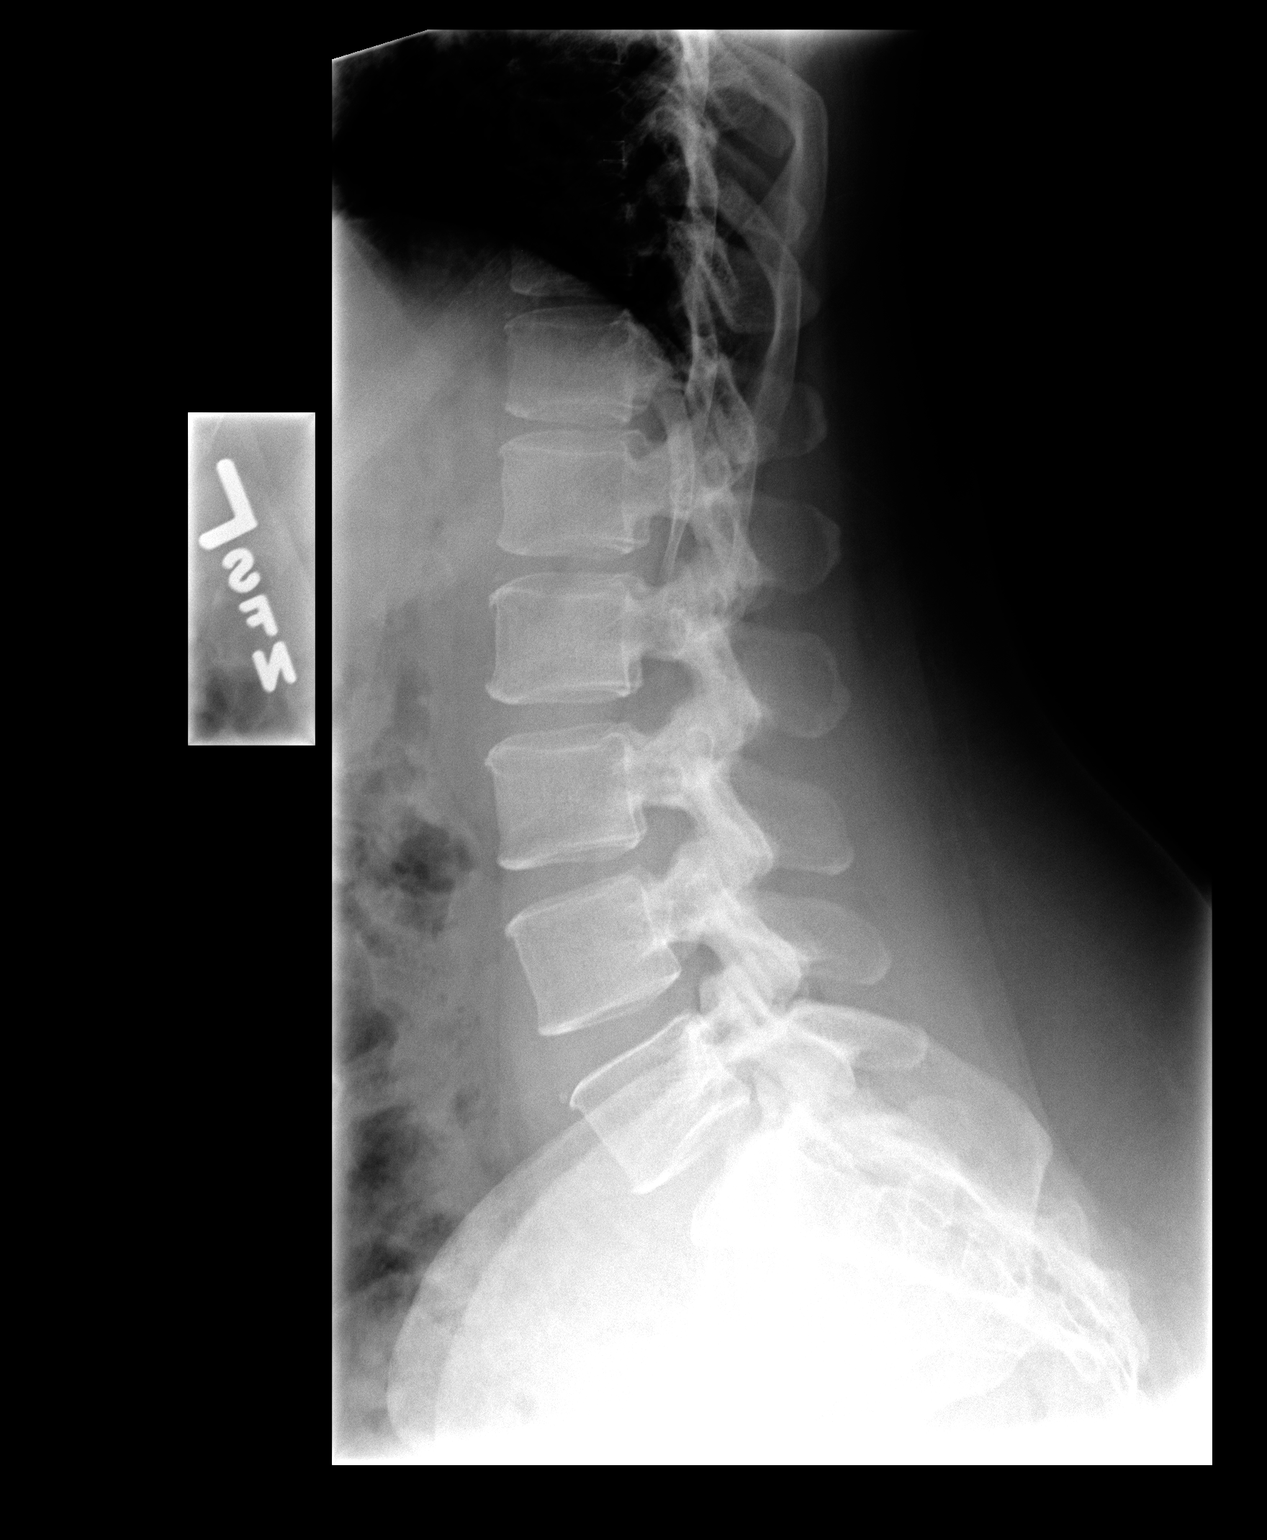

[view not recorded (3 of 3)]
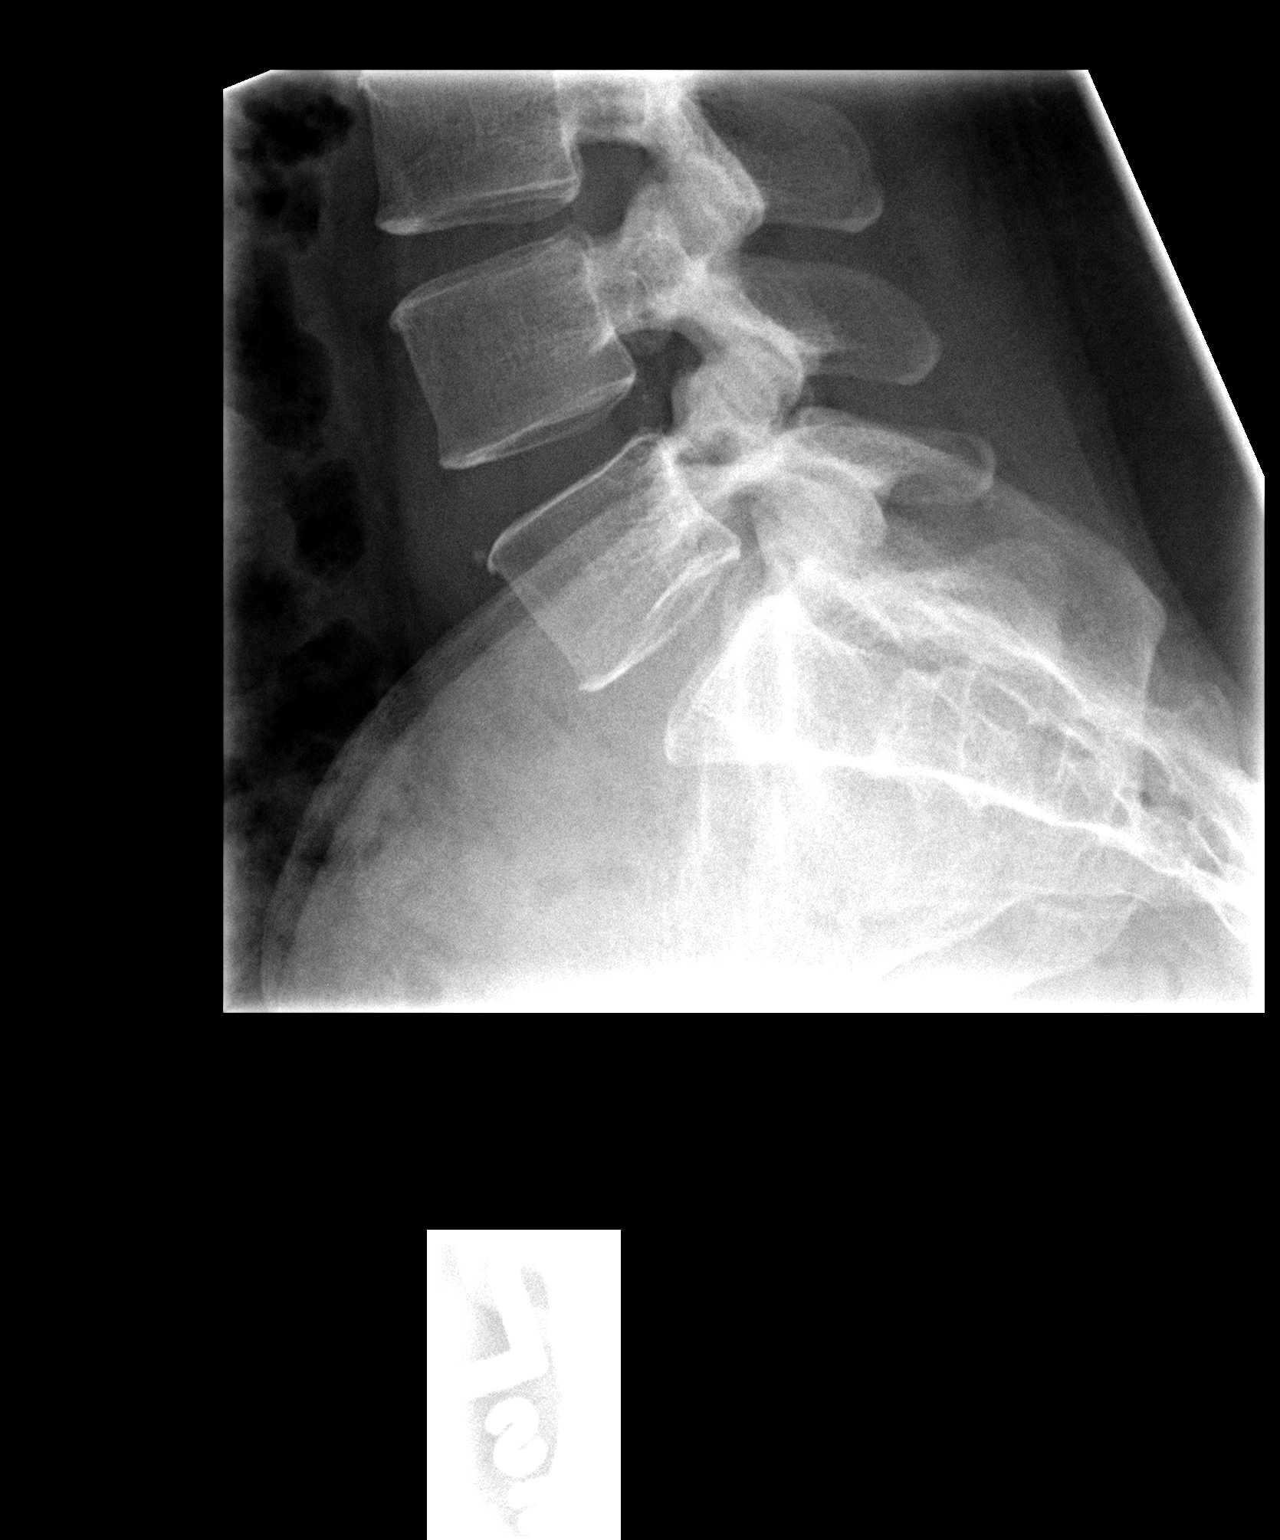

[3 of 3 positions shown; findings below may reference images not displayed]

FINDINGS: Five non-rib bearing lumbar vertebrae.
Osseous mineralization normal.
Vertebral body and disc space heights maintained.
No acute fracture, subluxation or bone destruction.
No spondylolysis.
SI joints symmetric.
IMPRESSION: No acute osseous abnormalities.

## 2015-07-14 ENCOUNTER — Ambulatory Visit (INDEPENDENT_AMBULATORY_CARE_PROVIDER_SITE_OTHER): Payer: Medicaid Other | Admitting: Family Medicine

## 2015-07-14 VITALS — BP 122/62 | HR 87 | Temp 98.6°F | Wt 206.0 lb

## 2015-07-14 DIAGNOSIS — L732 Hidradenitis suppurativa: Secondary | ICD-10-CM | POA: Diagnosis not present

## 2015-07-14 MED ORDER — CLINDAMYCIN PHOSPHATE 1 % EX GEL: Freq: Two times a day (BID) | CUTANEOUS | Status: DC

## 2015-07-14 NOTE — Assessment & Plan Note (Signed)
No obvious lesions to be drained today especially given multiple previous surgeries and extensive scar tissue   advised patient to keep her follow-up appointment with her surgeon in 2 weeks   discontinue oral medications Start clindamycin gel topically twice daily on affected areas  Return precautions given

## 2015-07-14 NOTE — Patient Instructions (Signed)
Nice to me today. Use the clindamycin gel in the affected areas twice daily until the inflammation improves 3 follow-up with the surgeon in a few weeks. Please come back to see [place] or let us know if it is getting worse or not getting any better.  Take care, Dr. Jacinto Reap

## 2015-07-14 NOTE — Progress Notes (Signed)
   Subjective:   Victoria Rodriguez is a 35 y.o. female with a history of chronic recurrent hidradenitis suppurativa here for same day appointment for hidradenitis.  Hidradenitis Had surgery a few times on bilateral arms Was supposed to have surgery again 12/20, but was found to be pregnant (currently at [redacted]w[redacted]d) Has been flared since then R arm and groin swollen and painful Using dial soap Wasn't sure about using clindamycin pills (has used in the past) while pregnant  has follow-up appointment schedule a surgery on 3/5  Review of Systems:  Per HPI. All other systems reviewed and are negative.   PMH, PSH, Medications, Allergies, and FmHx reviewed and updated in EMR.  Social History:  never smoker  Objective:  BP 122/62 mmHg  Pulse 87  Temp(Src) 98.6 F (37 C) (Oral)  Wt 206 lb (93.441 kg)  LMP 04/23/2015  Gen:  35 y.o. female in NAD  HEENT: NCAT, MMM, EOMI, PERRL, anicteric sclerae CV: RRR, no MRG Resp: Non-labored, CTAB, no wheezes noted Ext: WWP, no LE edema Skin:  Diffuse scar tissue under bilateral axilla , right axilla with multiple areas of inflammation and erythema,  No areas of fluctuance.  Groin also with multiple areas of inflammation and erythema and no discrete area of fluctuance Neuro: Alert and oriented, speech normal     Assessment & Plan:     Victoria Rodriguez is a 35 y.o. female here for  hidradenitis suppurativa  Chronic Recurrent Hidradenitis Suppurativa  No obvious lesions to be drained today especially given multiple previous surgeries and extensive scar tissue   advised patient to keep her follow-up appointment with her surgeon in 2 weeks   discontinue oral medications Start clindamycin gel topically twice daily on affected areas  Return precautions given     Virginia Crews, MD MPH PGY-2,  [facility] 07/14/2015  3:22 PM

## 2015-08-02 ENCOUNTER — Inpatient Hospital Stay (HOSPITAL_COMMUNITY)
Admission: AD | Admit: 2015-08-02 | Discharge: 2015-08-02 | Disposition: A | Discharge status: Home / Self Care | Payer: Medicaid Other | Source: Ambulatory Visit | Attending: Obstetrics | Admitting: Obstetrics

## 2015-08-02 ENCOUNTER — Encounter (HOSPITAL_COMMUNITY): Payer: Self-pay

## 2015-08-02 DIAGNOSIS — B373 Candidiasis of vulva and vagina: Secondary | ICD-10-CM

## 2015-08-02 DIAGNOSIS — B3731 Acute candidiasis of vulva and vagina: Secondary | ICD-10-CM

## 2015-08-02 DIAGNOSIS — O98812 Other maternal infectious and parasitic diseases complicating pregnancy, second trimester: Secondary | ICD-10-CM

## 2015-08-02 DIAGNOSIS — L293 Anogenital pruritus, unspecified: Secondary | ICD-10-CM | POA: Diagnosis present

## 2015-08-02 DIAGNOSIS — Z885 Allergy status to narcotic agent status: Secondary | ICD-10-CM | POA: Diagnosis not present

## 2015-08-02 DIAGNOSIS — Z3A14 14 weeks gestation of pregnancy: Secondary | ICD-10-CM | POA: Insufficient documentation

## 2015-08-02 LAB — URINALYSIS, ROUTINE W REFLEX MICROSCOPIC
Bilirubin Urine: NEGATIVE
GLUCOSE, UA: NEGATIVE mg/dL
KETONES UR: 15 mg/dL — AB
NITRITE: NEGATIVE
PH: 5.5 (ref 5.0–8.0)
Protein, ur: NEGATIVE mg/dL
SPECIFIC GRAVITY, URINE: 1.025 (ref 1.005–1.030)

## 2015-08-02 LAB — WET PREP, GENITAL
Clue Cells Wet Prep HPF POC: NONE SEEN
SPERM: NONE SEEN
TRICH WET PREP: NONE SEEN

## 2015-08-02 LAB — URINE MICROSCOPIC-ADD ON

## 2015-08-02 MED ORDER — TERCONAZOLE 0.4 % VA CREA: 1.0000 | TOPICAL_CREAM | Freq: Every day | VAGINAL | Status: DC

## 2015-08-02 NOTE — Discharge Instructions (Signed)
Probiotics WHAT ARE PROBIOTICS? Probiotics are the good bacteria and yeasts that live in your body and keep you and your digestive system healthy. Probiotics also help your body's defense (immune) system and protect your body against bad bacterial growth.  Certain foods contain probiotics, such as yogurt. Probiotics can also be purchased as a supplement. As with any supplement or drug, it is important to discuss its use with your health care provider.  WHAT AFFECTS THE BALANCE OF BACTERIA IN MY BODY? The balance of bacteria in your body can be affected by:   Antibiotic medicines. Antibiotics are sometimes necessary to treat infection. Unfortunately, they may kill good or friendly bacteria in your body as well as the bad bacteria. This may lead to stomach problems like diarrhea, gas, and cramping.  Disease. Some conditions are the result of an overgrowth of bad bacteria, yeasts, parasites, or fungi. These conditions include:   Infectious diarrhea.  Stomach and respiratory infections.  Skin infections.  Irritable bowel syndrome (IBS).  Inflammatory bowel diseases.  Ulcer due to Helicobacter pylori (H. pylori) infection.  Tooth decay and periodontal disease.  Vaginal infections. Stress and poor diet may also lower the good bacteria in your body.  WHAT TYPE OF PROBIOTIC IS RIGHT FOR ME? Probiotics are available over the counter at your local pharmacy, health food, or grocery store. They come in many different forms, combinations of strains, and dosing strengths. Some may need to be refrigerated. Always read the label for storage and usage instructions. Specific strains have been shown to be more effective for certain conditions. Ask your health care provider what option is best for you.  WHY WOULD I NEED PROBIOTICS? There are many reasons your health care provider might recommend a probiotic supplement, including:   Diarrhea.  Constipation.  IBS.  Respiratory infections.  Yeast  infections.  Acne, eczema, and other skin conditions.  Frequent urinary tract infections (UTIs). ARE THERE SIDE EFFECTS OF PROBIOTICS? Some people experience mild side effects when taking probiotics. Side effects are usually temporary and may include:   Gas.  Bloating.  Cramping. Rarely, serious side effects, such as infection or immune system changes, may occur. WHAT ELSE DO I NEED TO KNOW ABOUT PROBIOTICS?   There are many different strains of probiotics. Certain strains may be more effective depending on your condition. Probiotics are available in varying doses. Ask your health care provider which probiotic you should use and how often.   If you are taking probiotics along with antibiotics, it is generally recommended to wait at least 2 hours between taking the antibiotic and taking the probiotic.  FOR MORE INFORMATION:  [email or web address]   This information is not intended to replace advice given to you by your health care provider. Make sure you discuss any questions you have with your health care provider.   Document Released: 12/12/2013 Document Reviewed: 12/12/2013 Elsevier Interactive Patient Education 2016 [employer]  Monilial Vaginitis Vaginitis in a soreness, swelling and redness (inflammation) of the vagina and vulva. Monilial vaginitis is not a sexually transmitted infection. CAUSES  Yeast vaginitis is caused by yeast (candida) that is normally found in your vagina. With a yeast infection, the candida has overgrown in number to a point that upsets the chemical balance. SYMPTOMS   White, thick vaginal discharge.  Swelling, itching, redness and irritation of the vagina and possibly the lips of the vagina (vulva).  Burning or painful urination.  Painful intercourse. DIAGNOSIS  Things that may contribute  to monilial vaginitis are:  Postmenopausal and virginal  states.  Pregnancy.  Infections.  Being tired, sick or stressed, especially if you had monilial vaginitis in the past.  Diabetes. Good control will help lower the chance.  Birth control pills.  Tight fitting garments.  Using bubble bath, feminine sprays, douches or deodorant tampons.  Taking certain medications that kill germs (antibiotics).  Sporadic recurrence can occur if you become ill. TREATMENT  Your caregiver will give you medication.  There are several kinds of anti monilial vaginal creams and suppositories specific for monilial vaginitis. For recurrent yeast infections, use a suppository or cream in the vagina 2 times a week, or as directed.  Anti-monilial or steroid cream for the itching or irritation of the vulva may also be used. Get your caregiver's permission.  Painting the vagina with methylene blue solution may help if the monilial cream does not work.  Eating yogurt may help prevent monilial vaginitis. HOME CARE INSTRUCTIONS   Finish all medication as prescribed.  Do not have sex until treatment is completed or after your caregiver tells you it is okay.  Take warm sitz baths.  Do not douche.  Do not use tampons, especially scented ones.  Wear cotton underwear.  Avoid tight pants and panty hose.  Tell your sexual partner that you have a yeast infection. They should go to their caregiver if they have symptoms such as mild rash or itching.  Your sexual partner should be treated as well if your infection is difficult to eliminate.  Practice safer sex. Use condoms.  Some vaginal medications cause latex condoms to fail. Vaginal medications that harm condoms are:  Cleocin cream.  Butoconazole (Femstat).  Terconazole (Terazol) vaginal suppository.  Miconazole (Monistat) (may be purchased over the counter). SEEK MEDICAL CARE IF:   You have a temperature by mouth above 102 F (38.9 C).  The infection is getting worse after 2 days of  treatment.  The infection is not getting better after 3 days of treatment.  You develop blisters in or around your vagina.  You develop vaginal bleeding, and it is not your menstrual period.  You have pain when you urinate.  You develop intestinal problems.  You have pain with sexual intercourse.   This information is not intended to replace advice given to you by your health care provider. Make sure you discuss any questions you have with your health care provider.   Document Released: 02/24/2005 Document Revised: 08/09/2011 Document Reviewed: 11/18/2014 Elsevier Interactive Patient Education [employer].

## 2015-08-02 NOTE — MAU Provider Note (Signed)
History     CSN: [id number]  Arrival date and time: 08/02/15 1238   First Provider Initiated Contact with Patient 08/02/15 1340      Chief Complaint  Patient presents with  . Vaginal Itching  . Dysuria   HPI   Ms.Victoria Rodriguez is a 35 y.o. female [phone] @ [redacted]w[redacted]d presenting to MAU with vaginal itching and dysuria. Symptoms started 2-3 days ago. She has noticed some white discharge, mild odor. She has not tried anything over the counter.   OB History    Gravida Para Term Preterm AB TAB SAB Ectopic Multiple Living   5 1 1  0 3 2 1   1       Past Medical History  Diagnosis Date  . Hidradenitis suppurativa     surgical  . Chlamydia contact, treated     Past Surgical History  Procedure Laterality Date  . Axillary hidradenitis excision  2011    bilateral  . Irrigation and debridement abscess Left 11/16/2013    Procedure: IRRIGATION AND DEBRIDEMENT LEFT AXILLARY ABSCESS;  Surgeon: Pedro Earls, MD;  Location: [facility];  Service: General;  Laterality: Left;  . Hydradenitis excision Left 02/12/2014    Procedure: EXCISION HIDRADENITIS AXILLA;  Surgeon: Coralie Keens, MD;  Location: [facility];  Service: General;  Laterality: Left;  . Dilation and curettage of uterus      Family History  Problem Relation Age of Onset  . Asthma Mother   . Diabetes Father   . Asthma Sister   . Asthma Brother   . Cancer Neg Hx   . Heart disease Neg Hx   . Stroke Neg Hx     Social History  Substance Use Topics  . Smoking status: Never Smoker   . Smokeless tobacco: Never Used  . Alcohol Use: No     Comment: drinks monthly or less.when not pregnant     Allergies:  Allergies  Allergen Reactions  . Vicodin [Hydrocodone-Acetaminophen] Nausea Only    Prescriptions prior to admission  Medication Sig Dispense Refill Last Dose  . ferrous sulfate 325 (65 FE) MG tablet Take 325 mg by mouth daily with breakfast.   08/02/2015 at Unknown time  . Prenatal Vit-Fe Fumarate-FA  (MULTIVITAMIN-PRENATAL) 27-0.8 MG TABS tablet Take 1 tablet by mouth daily at 12 noon.   08/02/2015 at Unknown time  . clindamycin (CLINDAGEL) 1 % gel Apply topically 2 (two) times daily. Until inflammation improves or follow-up with surgery 30 g 1 prn  . promethazine (PHENERGAN) 25 MG tablet Take 1 tablet by mouth every 4 (four) hours.  0 prn  . terconazole (TERAZOL 7) 0.4 % vaginal cream Place 1 applicator vaginally at bedtime. For 7 nights (Patient not taking: Reported on 08/02/2015) 45 g 0 Completed Course at Unknown time   Results for orders placed or performed during the hospital encounter of 08/02/15 (from the past 48 hour(s))  Urinalysis, Routine w reflex microscopic (not at [facility])     Status: Abnormal   Collection Time: 08/02/15 12:50 PM  Result Value Ref Range   Color, Urine YELLOW YELLOW   APPearance CLEAR CLEAR   Specific Gravity, Urine 1.025 1.005 - 1.030   pH 5.5 5.0 - 8.0   Glucose, UA NEGATIVE NEGATIVE mg/dL   Hgb urine dipstick SMALL (A) NEGATIVE   Bilirubin Urine NEGATIVE NEGATIVE   Ketones, ur 15 (A) NEGATIVE mg/dL   Protein, ur NEGATIVE NEGATIVE mg/dL   Nitrite NEGATIVE NEGATIVE   Leukocytes, UA TRACE (A) NEGATIVE  Urine microscopic-add on     Status: Abnormal   Collection Time: 08/02/15 12:50 PM  Result Value Ref Range   Squamous Epithelial / LPF 0-5 (A) NONE SEEN   WBC, UA 0-5 0 - 5 WBC/hpf   RBC / HPF 0-5 0 - 5 RBC/hpf   Bacteria, UA RARE (A) NONE SEEN   Urine-Other MUCOUS PRESENT   Wet prep, genital     Status: Abnormal   Collection Time: 08/02/15  1:39 PM  Result Value Ref Range   Yeast Wet Prep HPF POC PRESENT (A) NONE SEEN   Trich, Wet Prep NONE SEEN NONE SEEN   Clue Cells Wet Prep HPF POC NONE SEEN NONE SEEN   WBC, Wet Prep HPF POC FEW (A) NONE SEEN    Comment: MODERATE BACTERIA SEEN   Sperm NONE SEEN     Review of Systems  Constitutional: Negative for fever and chills.  Genitourinary: Positive for dysuria. Negative for urgency, frequency, hematuria and  flank pain.  Musculoskeletal: Negative for back pain.   Physical Exam   Blood pressure 120/65, pulse 86, temperature 98.8 F (37.1 C), temperature source Oral, resp. rate 18, height 5\' 3"  (1.6 m), weight 211 lb 9.6 oz (95.981 kg), last menstrual period 04/23/2015, unknown if currently breastfeeding.  Physical Exam  Constitutional: She is oriented to person, place, and time. She appears well-developed and well-nourished. No distress.  Genitourinary:  Wet prep and GC collected without speculum.  Musculoskeletal: Normal range of motion.  Neurological: She is alert and oriented to person, place, and time.  Skin: Skin is warm. She is not diaphoretic.  Psychiatric: Her behavior is normal.    MAU Course  Procedures  None  MDM  Wet prep GC Urine culture pending   + fetal heart tones.   Assessment and Plan   A:  1. Yeast vaginitis     P:  Discharge home in stable condition RX: Terazol 7 day Follow up with Dr. Ruthann Cancer as scheduled Return to MAU for emergencies.  Urine culture pending    Lezlie Lye, NP 08/02/2015 2:01 PM

## 2015-08-02 NOTE — MAU Note (Signed)
Onset of vaginal itching and burning 3 days, no discharge.

## 2015-08-04 LAB — URINE CULTURE: CULTURE: NO GROWTH

## 2015-08-04 LAB — GC/CHLAMYDIA PROBE AMP (~~LOC~~) NOT AT ARMC
CHLAMYDIA, DNA PROBE: NEGATIVE
Neisseria Gonorrhea: NEGATIVE

## 2015-08-19 ENCOUNTER — Telehealth: Payer: Self-pay | Admitting: Family Medicine

## 2015-08-20 ENCOUNTER — Other Ambulatory Visit (HOSPITAL_COMMUNITY): Payer: Self-pay | Admitting: Obstetrics

## 2015-08-20 DIAGNOSIS — Z3689 Encounter for other specified antenatal screening: Secondary | ICD-10-CM

## 2015-08-20 DIAGNOSIS — Z3A18 18 weeks gestation of pregnancy: Secondary | ICD-10-CM

## 2015-09-01 ENCOUNTER — Ambulatory Visit (HOSPITAL_COMMUNITY)
Admission: RE | Admit: 2015-09-01 | Discharge: 2015-09-01 | Disposition: A | Discharge status: Home / Self Care | Payer: Medicaid Other | Source: Ambulatory Visit | Attending: Obstetrics | Admitting: Obstetrics

## 2015-09-01 ENCOUNTER — Other Ambulatory Visit (HOSPITAL_COMMUNITY): Payer: Self-pay | Admitting: Obstetrics

## 2015-09-01 DIAGNOSIS — Z3A18 18 weeks gestation of pregnancy: Secondary | ICD-10-CM

## 2015-09-01 DIAGNOSIS — Z3689 Encounter for other specified antenatal screening: Secondary | ICD-10-CM

## 2015-09-01 DIAGNOSIS — Z36 Encounter for antenatal screening of mother: Secondary | ICD-10-CM | POA: Diagnosis not present

## 2015-09-01 DIAGNOSIS — O09522 Supervision of elderly multigravida, second trimester: Secondary | ICD-10-CM

## 2015-09-02 ENCOUNTER — Telehealth: Payer: Self-pay | Admitting: Family Medicine

## 2015-09-02 NOTE — Telephone Encounter (Signed)
Pt is calling because her current OBGYN needs a copy of her last physical so that they do not do another one and have her insurance bill her for this. Please leave up front so that patient can pick this up. She thinks that this was done in May 2016. jw

## 2015-09-02 NOTE — Telephone Encounter (Signed)
Note placed up front for patient to pick up. Sherece Gambrill,CMA

## 2015-09-03 ENCOUNTER — Ambulatory Visit (HOSPITAL_COMMUNITY)
Admission: RE | Admit: 2015-09-03 | Discharge: 2015-09-03 | Disposition: A | Discharge status: Home / Self Care | Payer: Medicaid Other | Source: Ambulatory Visit | Attending: Obstetrics | Admitting: Obstetrics

## 2015-09-03 DIAGNOSIS — Z8279 Family history of other congenital malformations, deformations and chromosomal abnormalities: Secondary | ICD-10-CM | POA: Diagnosis not present

## 2015-09-03 DIAGNOSIS — Z315 Encounter for genetic counseling: Secondary | ICD-10-CM | POA: Insufficient documentation

## 2015-09-03 DIAGNOSIS — Z3A19 19 weeks gestation of pregnancy: Secondary | ICD-10-CM | POA: Insufficient documentation

## 2015-09-03 DIAGNOSIS — O09522 Supervision of elderly multigravida, second trimester: Secondary | ICD-10-CM

## 2015-09-03 DIAGNOSIS — O09529 Supervision of elderly multigravida, unspecified trimester: Secondary | ICD-10-CM | POA: Insufficient documentation

## 2015-09-03 LAB — QUAD SCREEN FOR MFM

## 2015-09-03 NOTE — Progress Notes (Signed)
Appointment Date: 09/03/2015 DOB: [birth year] Referring Provider: Frederico Hamman, MD Attending: Dr. Renella Cunas  Ms. NAADIRA SCHWEPPE was seen for genetic counseling because of a maternal age of 35 y.o..     In summary:  Discussed maternal age and associated risks for fetal aneuploidy  Reviewed results of ultrasound  No fetal anomalies or markers seen  Reduction in risk for fetal aneuploidy  Offered additional testing and screening  NIPS  Drawn today  Quad screen  AFP-only drawn today for ONTD screening  Amniocentesis  Declined  Family history of autism in the father of the baby's son   She was counseled regarding maternal age and the association with risk for chromosome conditions due to nondisjunction with aging of the ova.   We reviewed chromosomes, nondisjunction, and the associated 1 in 111 risk for fetal aneuploidy related to a maternal age of 35 y.o. at [redacted]w[redacted]d gestation.  She was counseled that the risk for aneuploidy decreases as gestational age increases, accounting for those pregnancies which spontaneously abort.  We specifically discussed Down syndrome (trisomy 43), trisomies 69 and 12, and sex chromosome aneuploidies (47,XXX and 47,XXY) including the common features and prognoses of each.   We discussed that 50-80% of fetuses with Down syndrome and up to 90% of fetuses with trisomies 48 and 18, when well visualized, have detectable anomalies or soft markers by ultrasound.  A complete ultrasound was performed earlier this week. The ultrasound report was sent under separate cover. There were no visualized fetal anomalies or markers suggestive of aneuploidy.   We also discussed the option of noninvasive prenatal screening (NIPS)/cell free DNA (cfDNA) screening or Quad screening.  She was counseled that this screening test can provide a pregnancy specific risk assessment. We reviewed the benefits and limitations of this screening option. Specifically, we discussed  the conditions for which the test screens, the detection rates, and false positive rates for each. She was also counseled regarding diagnostic testing via amniocentesis. We reviewed the approximate 1 in [id number] risk for complications for amniocentesis, including spontaneous pregnancy loss. Ms. Kimery elected to have NIPS drawn today, and also requested that AFP-only (not Quad screen) be drawn for risk assessment for open neural tube defects.  Ms. Ayub was provided with written information regarding sickle cell anemia (SCA) including the carrier frequency and incidence in the African-American population, the availability of carrier testing and prenatal diagnosis if indicated.  In addition, we discussed that hemoglobinopathies are routinely screened for as part of the [place] newborn screening panel.  She was notified that screening for SCA was performed at her referring physician's office and found to be negative.   Both family histories were reviewed and found to be noncontributory for birth defects, intellectual disability, and known genetic conditions. Ms. Teagle reported that the father of the baby has a son from a previous relationship who has autism.  He also has two other children with that partner who do not have autism. We discussed that autism is part of the spectrum of conditions referred to as Autistic spectrum disorders (ASD). We discussed that ASDs are among the most common neurodevelopmental disorders, with approximately 1 in 68 children meeting criteria for ASD, according to the Centers for Disease Control. Approximately 80% of individuals diagnosed are female. There is strong evidence that genetic factors play a critical role in development of ASD. There have been recent advances in identifying specific genetic causes of ASD, however, there are still many individuals for whom the etiology of the ASD  is not known. There is strong evidence that genetic factors play a critical role in development of  ASD. Some individuals with ASDs are found to have causative differences in karyotype analysis, chromosomal microarray analysis, or single genes. These are more likely to be identified in individuals with complex autism spectrum disorders.  Genetic information is not known for the affected child.  Once a family has a child with a diagnosis of ASD, there is a 13.5% chance to have another child with ASD, however the chance for a half sibling is not known.  At this time there is not genetic testing available for ASD for most families.  Without further information regarding the provided family history, an accurate genetic risk cannot be calculated. Further genetic counseling is warranted if more information is obtained.  Ms. Glotfelty denied exposure to environmental toxins or chemical agents. She denied the use of alcohol, tobacco or street drugs. She denied significant viral illnesses during the course of her pregnancy. Her medical and surgical histories were noncontributory.   I counseled Ms. Monsivais regarding the above risks and available options.  The approximate face-to-face time with the genetic counselor was 40 minutes.  Cam Hai, MS,  Certified Genetic Counselor

## 2015-09-09 ENCOUNTER — Telehealth (HOSPITAL_COMMUNITY): Payer: Self-pay | Admitting: MS"

## 2015-09-09 NOTE — Telephone Encounter (Signed)
Called Victoria Rodriguez to discuss her prenatal cell free DNA test results.  Ms. KEYASIA KRUTH had Pan[facility] testing through [facility].  Testing was offered because of maternal age.   The patient was identified by name and DOB.  We reviewed that these are within normal limits, showing a less than 1 in 10,000 risk for trisomies 21, 18 and 13, and monosomy X (Turner syndrome).  In addition, the risk for triploidy/vanishing twin and sex chromosome trisomies (47,XXX and 47,XXY) was also low risk.  We reviewed that this testing identifies > 99% of pregnancies with trisomy 96, trisomy 85, sex chromosome trisomies (47,XXX and 47,XXY), and triploidy. The detection rate for trisomy 18 is 96%.  The detection rate for monosomy X is ~92%.  The false positive rate is <0.1% for all conditions. Testing was also consistent with female fetal sex.  She understands that this testing does not identify all genetic conditions.  All questions were answered to her satisfaction, she was encouraged to call with additional questions or concerns.  Chipper Oman, MS Certified Genetic Counselor 09/09/2015

## 2015-09-11 ENCOUNTER — Other Ambulatory Visit (HOSPITAL_COMMUNITY): Payer: Self-pay

## 2015-10-05 ENCOUNTER — Encounter (HOSPITAL_COMMUNITY): Payer: Self-pay

## 2015-10-05 ENCOUNTER — Inpatient Hospital Stay (HOSPITAL_COMMUNITY)
Admission: AD | Admit: 2015-10-05 | Discharge: 2015-10-05 | Disposition: A | Discharge status: Home / Self Care | Payer: Medicaid Other | Source: Ambulatory Visit | Attending: Obstetrics | Admitting: Obstetrics

## 2015-10-05 DIAGNOSIS — B373 Candidiasis of vulva and vagina: Secondary | ICD-10-CM | POA: Insufficient documentation

## 2015-10-05 DIAGNOSIS — Z825 Family history of asthma and other chronic lower respiratory diseases: Secondary | ICD-10-CM | POA: Diagnosis not present

## 2015-10-05 DIAGNOSIS — R109 Unspecified abdominal pain: Secondary | ICD-10-CM

## 2015-10-05 DIAGNOSIS — N760 Acute vaginitis: Secondary | ICD-10-CM | POA: Insufficient documentation

## 2015-10-05 DIAGNOSIS — E860 Dehydration: Secondary | ICD-10-CM

## 2015-10-05 DIAGNOSIS — O98812 Other maternal infectious and parasitic diseases complicating pregnancy, second trimester: Secondary | ICD-10-CM | POA: Diagnosis not present

## 2015-10-05 DIAGNOSIS — Z833 Family history of diabetes mellitus: Secondary | ICD-10-CM | POA: Insufficient documentation

## 2015-10-05 DIAGNOSIS — B3731 Acute candidiasis of vulva and vagina: Secondary | ICD-10-CM

## 2015-10-05 DIAGNOSIS — O26892 Other specified pregnancy related conditions, second trimester: Secondary | ICD-10-CM | POA: Insufficient documentation

## 2015-10-05 DIAGNOSIS — Z885 Allergy status to narcotic agent status: Secondary | ICD-10-CM | POA: Diagnosis not present

## 2015-10-05 DIAGNOSIS — O23592 Infection of other part of genital tract in pregnancy, second trimester: Secondary | ICD-10-CM | POA: Diagnosis not present

## 2015-10-05 DIAGNOSIS — Z3A23 23 weeks gestation of pregnancy: Secondary | ICD-10-CM

## 2015-10-05 DIAGNOSIS — O26899 Other specified pregnancy related conditions, unspecified trimester: Secondary | ICD-10-CM

## 2015-10-05 DIAGNOSIS — O9989 Other specified diseases and conditions complicating pregnancy, childbirth and the puerperium: Secondary | ICD-10-CM | POA: Diagnosis not present

## 2015-10-05 LAB — WET PREP, GENITAL
Clue Cells Wet Prep HPF POC: NONE SEEN
SPERM: NONE SEEN
TRICH WET PREP: NONE SEEN
YEAST WET PREP: NONE SEEN

## 2015-10-05 LAB — URINALYSIS, ROUTINE W REFLEX MICROSCOPIC
BILIRUBIN URINE: NEGATIVE
Glucose, UA: NEGATIVE mg/dL
KETONES UR: 15 mg/dL — AB
Leukocytes, UA: NEGATIVE
NITRITE: NEGATIVE
PROTEIN: NEGATIVE mg/dL
pH: 5.5 (ref 5.0–8.0)

## 2015-10-05 LAB — URINE MICROSCOPIC-ADD ON

## 2015-10-05 LAB — FETAL FIBRONECTIN: FETAL FIBRONECTIN: NEGATIVE

## 2015-10-05 MED ORDER — TERCONAZOLE 0.4 % VA CREA: 1.0000 | TOPICAL_CREAM | Freq: Every day | VAGINAL | Status: DC

## 2015-10-05 NOTE — Discharge Instructions (Signed)
Preterm Labor Information Preterm labor is when labor starts before you are [redacted] weeks pregnant. The normal length of pregnancy is 39 to 41 weeks.  CAUSES  The cause of preterm labor is not often known. The most common known cause is infection. RISK FACTORS  Having a history of preterm labor.  Having your water break before it should.  Having a placenta that covers the opening of the cervix.  Having a placenta that breaks away from the uterus.  Having a cervix that is too weak to hold the baby in the uterus.  Having too much fluid in the amniotic sac.  Taking drugs or smoking while pregnant.  Not gaining enough weight while pregnant.  Being younger than 2 and older than 36 years old.  Having a low income.  Being African American. SYMPTOMS  Period-like cramps, belly (abdominal) pain, or back pain.  Contractions that are regular, as often as six in an hour. They may be mild or painful.  Contractions that start at the top of the belly. They then move to the lower belly and back.  Lower belly pressure that seems to get stronger.  Bleeding from the vagina.  Fluid leaking from the vagina. TREATMENT  Treatment depends on:  Your condition.  The condition of your baby.  How many weeks pregnant you are. Your doctor may have you:  Take medicine to stop contractions.  Stay in bed except to use the restroom (bed rest).  Stay in the hospital. WHAT SHOULD YOU DO IF YOU THINK YOU ARE IN PRETERM LABOR? Call your doctor right away. You need to go to the hospital right away.  HOW CAN YOU PREVENT PRETERM LABOR IN FUTURE PREGNANCIES?  Stop smoking, if you smoke.  Maintain healthy weight gain.  Do not take drugs or be around chemicals that are not needed.  Tell your doctor if you think you have an infection.  Tell your doctor if you had a preterm labor before.   This information is not intended to replace advice given to you by your health care provider. Make sure you  discuss any questions you have with your health care provider.   Document Released: 08/13/2008 Document Revised: 10/01/2014 Document Reviewed: 06/19/2012 Elsevier Interactive Patient Education 2016 Elsevier Inc. Monilial Vaginitis Vaginitis in a soreness, swelling and redness (inflammation) of the vagina and vulva. Monilial vaginitis is not a sexually transmitted infection. CAUSES  Yeast vaginitis is caused by yeast (candida) that is normally found in your vagina. With a yeast infection, the candida has overgrown in number to a point that upsets the chemical balance. SYMPTOMS   White, thick vaginal discharge.  Swelling, itching, redness and irritation of the vagina and possibly the lips of the vagina (vulva).  Burning or painful urination.  Painful intercourse. DIAGNOSIS  Things that may contribute to monilial vaginitis are:  Postmenopausal and virginal states.  Pregnancy.  Infections.  Being tired, sick or stressed, especially if you had monilial vaginitis in the past.  Diabetes. Good control will help lower the chance.  Birth control pills.  Tight fitting garments.  Using bubble bath, feminine sprays, douches or deodorant tampons.  Taking certain medications that kill germs (antibiotics).  Sporadic recurrence can occur if you become ill. TREATMENT  Your caregiver will give you medication.  There are several kinds of anti monilial vaginal creams and suppositories specific for monilial vaginitis. For recurrent yeast infections, use a suppository or cream in the vagina 2 times a week, or as directed.  Anti-monilial or  steroid cream for the itching or irritation of the vulva may also be used. Get your caregiver's permission.  Painting the vagina with methylene blue solution may help if the monilial cream does not work.  Eating yogurt may help prevent monilial vaginitis. HOME CARE INSTRUCTIONS   Finish all medication as prescribed.  Do not have sex until treatment  is completed or after your caregiver tells you it is okay.  Take warm sitz baths.  Do not douche.  Do not use tampons, especially scented ones.  Wear cotton underwear.  Avoid tight pants and panty hose.  Tell your sexual partner that you have a yeast infection. They should go to their caregiver if they have symptoms such as mild rash or itching.  Your sexual partner should be treated as well if your infection is difficult to eliminate.  Practice safer sex. Use condoms.  Some vaginal medications cause latex condoms to fail. Vaginal medications that harm condoms are:  Cleocin cream.  Butoconazole (Femstat).  Terconazole (Terazol) vaginal suppository.  Miconazole (Monistat) (may be purchased over the counter). SEEK MEDICAL CARE IF:   You have a temperature by mouth above 102 F (38.9 C).  The infection is getting worse after 2 days of treatment.  The infection is not getting better after 3 days of treatment.  You develop blisters in or around your vagina.  You develop vaginal bleeding, and it is not your menstrual period.  You have pain when you urinate.  You develop intestinal problems.  You have pain with sexual intercourse.   This information is not intended to replace advice given to you by your health care provider. Make sure you discuss any questions you have with your health care provider.   Document Released: 02/24/2005 Document Revised: 08/09/2011 Document Reviewed: 11/18/2014 Elsevier Interactive Patient Education [employer].

## 2015-10-05 NOTE — MAU Note (Signed)
Had some tightening in abdomen starting about 1600. Has been off and on since then. Denies LOF or bleeding.

## 2015-10-05 NOTE — MAU Provider Note (Signed)
History     CSN: [id number]  Arrival date and time: 10/05/15 0050   First Provider Initiated Contact with Patient 10/05/15 0122      Chief Complaint  Patient presents with  . Abdominal Cramping   HPI Victoria Rodriguez is a 35 y.o. [phone] at [redacted]w[redacted]d who presents to MAU today with complaint of abdominal tightening. The patient states that around 1600 today she noted one episode of tightening across the abdomen x 45 minutes. She states that resolved and then tightening has resumed off and on since late last night. She denies vaginal bleeding, LOF, vaginal discharge, abdominal pain, UTI symptoms, N/V/D or complications with the pregnancy. She is currently on Pen VK due to hydradenitis. Last intercourse was 09/18/15. She reports good fetal movement. Previous vaginal delivery without complications at full-term with only child.   OB History    Gravida Para Term Preterm AB TAB SAB Ectopic Multiple Living   5 1 1  0 3 2 1   1       Past Medical History  Diagnosis Date  . Hidradenitis suppurativa     surgical  . Chlamydia contact, treated     Past Surgical History  Procedure Laterality Date  . Axillary hidradenitis excision  2011    bilateral  . Irrigation and debridement abscess Left 11/16/2013    Procedure: IRRIGATION AND DEBRIDEMENT LEFT AXILLARY ABSCESS;  Surgeon: Pedro Earls, MD;  Location: [facility];  Service: General;  Laterality: Left;  . Hydradenitis excision Left 02/12/2014    Procedure: EXCISION HIDRADENITIS AXILLA;  Surgeon: Coralie Keens, MD;  Location: [facility];  Service: General;  Laterality: Left;  . Dilation and curettage of uterus      Family History  Problem Relation Age of Onset  . Asthma Mother   . Diabetes Father   . Asthma Sister   . Asthma Brother   . Cancer Neg Hx   . Heart disease Neg Hx   . Stroke Neg Hx     Social History  Substance Use Topics  . Smoking status: Never Smoker   . Smokeless tobacco: Never Used  . Alcohol Use:  No     Comment: drinks monthly or less.when not pregnant     Allergies:  Allergies  Allergen Reactions  . Vicodin [Hydrocodone-Acetaminophen] Nausea Only    Prescriptions prior to admission  Medication Sig Dispense Refill Last Dose  . penicillin v potassium (VEETID) 250 MG tablet Take 250 mg by mouth 3 (three) times daily.   10/04/2015 at Unknown time  . Prenatal Vit-Fe Fumarate-FA (MULTIVITAMIN-PRENATAL) 27-0.8 MG TABS tablet Take 1 tablet by mouth daily at 12 noon.   10/04/2015 at Unknown time  . clindamycin (CLINDAGEL) 1 % gel Apply topically 2 (two) times daily. Until inflammation improves or follow-up with surgery 30 g 1 prn  . ferrous sulfate 325 (65 FE) MG tablet Take 325 mg by mouth daily with breakfast.   08/02/2015 at Unknown time  . promethazine (PHENERGAN) 25 MG tablet Take 1 tablet by mouth every 4 (four) hours.  0 prn  . [DISCONTINUED] terconazole (TERAZOL 7) 0.4 % vaginal cream Place 1 applicator vaginally at bedtime. 45 g 0     Review of Systems  Constitutional: Negative for fever and malaise/fatigue.  Gastrointestinal: Negative for nausea, vomiting, abdominal pain, diarrhea and constipation.  Genitourinary: Negative for dysuria, urgency and frequency.       Neg - vaginal bleeding, discharge, LOF   Physical Exam   Blood pressure 124/75, pulse  100, temperature 98.4 F (36.9 C), resp. rate 18, height 5\' 3"  (1.6 m), weight 224 lb (101.606 kg), last menstrual period 04/23/2015, unknown if currently breastfeeding.  Physical Exam  Nursing note and vitals reviewed. Constitutional: She is oriented to person, place, and time. She appears well-developed and well-nourished. No distress.  HENT:  Head: Normocephalic and atraumatic.  Cardiovascular: Normal rate.   Respiratory: Effort normal.  GI: Soft. She exhibits no distension and no mass. There is no tenderness. There is no rebound and no guarding.  Genitourinary: Uterus is enlarged. Cervix exhibits no motion tenderness, no  discharge and no friability. No bleeding in the vagina. Vaginal discharge (moderate thick, white discharge noted) found.  Neurological: She is alert and oriented to person, place, and time.  Skin: Skin is warm and dry. No erythema.  Psychiatric: She has a normal mood and affect.   Dilation: 1 Effacement (%): Thick Cervical Position: Posterior Station: Ballotable Exam by:: Tomi Bamberger, PA-C   Results for orders placed or performed during the hospital encounter of 10/05/15 (from the past 24 hour(s))  Urinalysis, Routine w reflex microscopic (not at [facility])     Status: Abnormal   Collection Time: 10/05/15  1:05 AM  Result Value Ref Range   Color, Urine YELLOW YELLOW   APPearance CLEAR CLEAR   Specific Gravity, Urine >1.030 (H) 1.005 - 1.030   pH 5.5 5.0 - 8.0   Glucose, UA NEGATIVE NEGATIVE mg/dL   Hgb urine dipstick MODERATE (A) NEGATIVE   Bilirubin Urine NEGATIVE NEGATIVE   Ketones, ur 15 (A) NEGATIVE mg/dL   Protein, ur NEGATIVE NEGATIVE mg/dL   Nitrite NEGATIVE NEGATIVE   Leukocytes, UA NEGATIVE NEGATIVE  Urine microscopic-add on     Status: Abnormal   Collection Time: 10/05/15  1:05 AM  Result Value Ref Range   Squamous Epithelial / LPF 0-5 (A) NONE SEEN   WBC, UA 0-5 0 - 5 WBC/hpf   RBC / HPF 0-5 0 - 5 RBC/hpf   Bacteria, UA RARE (A) NONE SEEN   Crystals CA OXALATE CRYSTALS (A) NEGATIVE   Urine-Other AMORPHOUS URATES/PHOSPHATES   Wet prep, genital     Status: Abnormal   Collection Time: 10/05/15  1:35 AM  Result Value Ref Range   Yeast Wet Prep HPF POC NONE SEEN NONE SEEN   Trich, Wet Prep NONE SEEN NONE SEEN   Clue Cells Wet Prep HPF POC NONE SEEN NONE SEEN   WBC, Wet Prep HPF POC FEW (A) NONE SEEN   Sperm NONE SEEN   Fetal fibronectin     Status: None   Collection Time: 10/05/15  1:35 AM  Result Value Ref Range   Fetal Fibronectin NEGATIVE NEGATIVE     Fetal Monitoring: Baseline: 140 bpm Variability: moderate Accelerations: 10 x 10 Decelerations:  none Contractions: none  MAU Course  Procedures  MDM UA, wet prep, GC/Chlamydia and FFN today FFN collected prior to cervical exam Patient is PO hydrating in MAU and tolerate well Wet prep - negative, will treat clinically for yeast due to exam results and recent antibiotic use x 2 weeks No contractions noted on TOCO. Mild UI at times. Negative FFN encouraging that cervical exam does not reflect change from baseline.  Assessment and Plan  A: [facility] at [redacted]w[redacted]d Yeast vulvovaginitis Abdominal pain in pregnancy, second trimester  P: Discharge home Rx for Terazol 7 cream sent to patient's pharmacy  Preterm labor precautions discussed Increased PO hydration recommended as tolerated Patient advised to follow-up with Dr. Ruthann Cancer as  scheduled or sooner PRN Patient may return to MAU as needed or if her condition were to change or worsen   Luvenia Redden, PA-C  10/05/2015, 2:31 AM

## 2015-10-06 LAB — GC/CHLAMYDIA PROBE AMP (~~LOC~~) NOT AT ARMC
Chlamydia: NEGATIVE
NEISSERIA GONORRHEA: NEGATIVE

## 2015-12-08 ENCOUNTER — Encounter: Payer: Self-pay | Admitting: *Deleted

## 2015-12-08 DIAGNOSIS — Z3490 Encounter for supervision of normal pregnancy, unspecified, unspecified trimester: Secondary | ICD-10-CM | POA: Insufficient documentation

## 2015-12-08 DIAGNOSIS — O0990 Supervision of high risk pregnancy, unspecified, unspecified trimester: Secondary | ICD-10-CM

## 2015-12-08 DIAGNOSIS — Z3492 Encounter for supervision of normal pregnancy, unspecified, second trimester: Secondary | ICD-10-CM

## 2015-12-09 ENCOUNTER — Ambulatory Visit (INDEPENDENT_AMBULATORY_CARE_PROVIDER_SITE_OTHER): Payer: Medicaid Other | Admitting: Obstetrics and Gynecology

## 2015-12-09 ENCOUNTER — Encounter: Payer: Self-pay | Admitting: Obstetrics and Gynecology

## 2015-12-09 VITALS — BP 131/82 | HR 108 | Wt 228.0 lb

## 2015-12-09 DIAGNOSIS — O0993 Supervision of high risk pregnancy, unspecified, third trimester: Secondary | ICD-10-CM | POA: Diagnosis not present

## 2015-12-09 DIAGNOSIS — Z23 Encounter for immunization: Secondary | ICD-10-CM | POA: Diagnosis not present

## 2015-12-09 DIAGNOSIS — O09523 Supervision of elderly multigravida, third trimester: Secondary | ICD-10-CM

## 2015-12-09 NOTE — Progress Notes (Signed)
Subjective:  Victoria Rodriguez is a 35 y.o. [phone] at [redacted]w[redacted]d being seen today for ongoing prenatal care.  She is currently monitored for the following issues for this low-risk pregnancy and has Obesity; Keratosis punctata; Chronic Recurrent Hidradenitis Suppurativa; Microscopic hematuria; Abscess of breast; Advanced maternal age in multigravida; and Supervision of high risk pregnancy, antepartum on her problem list.  Patient reports no complaints.   Contractions: Not present. Vag. Bleeding: Scant.   . Denies leaking of fluid.   The following portions of the patient's history were reviewed and updated as appropriate: allergies, current medications, past family history, past medical history, past social history, past surgical history and problem list. Problem list updated.  Objective:   Filed Vitals:   12/09/15 1008  BP: 131/82  Pulse: 108  Weight: 228 lb (103.42 kg)    Fetal Status: Fetal Heart Rate (bpm): 144         General:  Alert, oriented and cooperative. Patient is in no acute distress.  Skin: Skin is warm and dry. No rash noted.   Cardiovascular: Normal heart rate noted  Respiratory: Normal respiratory effort, no problems with respiration noted  Abdomen: Soft, gravid, appropriate for gestational age. Pain/Pressure: Absent     Pelvic:  Cervical exam deferred        Extremities: Normal range of motion.  Edema: Trace  Mental Status: Normal mood and affect. Normal behavior. Normal judgment and thought content.   Urinalysis: Urine Protein: Trace Urine Glucose: Negative  Assessment and Plan:  Pregnancy: [id number] at [redacted]w[redacted]d  1. Supervision of high risk pregnancy, antepartum, third trimester Routine care. tdap today. Undecided on BC  2. Advanced maternal age in multigravida, third trimester No issues  Preterm labor symptoms and general obstetric precautions including but not limited to vaginal bleeding, contractions, leaking of fluid and fetal movement were reviewed in detail with  the patient. Please refer to After Visit Summary for other counseling recommendations.  Return in about 2 weeks (around 12/23/2015).   Aletha Halim, MD

## 2015-12-09 NOTE — Addendum Note (Signed)
Addended by: Phill Myron on: 12/09/2015 10:46 AM   Modules accepted: Orders

## 2015-12-23 ENCOUNTER — Encounter: Payer: Self-pay | Admitting: Obstetrics and Gynecology

## 2015-12-23 ENCOUNTER — Ambulatory Visit (INDEPENDENT_AMBULATORY_CARE_PROVIDER_SITE_OTHER): Payer: Medicaid Other | Admitting: Obstetrics and Gynecology

## 2015-12-23 VITALS — BP 119/79 | HR 91 | Wt 227.0 lb

## 2015-12-23 DIAGNOSIS — O0993 Supervision of high risk pregnancy, unspecified, third trimester: Secondary | ICD-10-CM

## 2015-12-23 DIAGNOSIS — L732 Hidradenitis suppurativa: Secondary | ICD-10-CM | POA: Diagnosis not present

## 2015-12-23 DIAGNOSIS — O09523 Supervision of elderly multigravida, third trimester: Secondary | ICD-10-CM | POA: Diagnosis not present

## 2015-12-23 DIAGNOSIS — Z6841 Body Mass Index (BMI) 40.0 and over, adult: Secondary | ICD-10-CM | POA: Diagnosis not present

## 2015-12-23 DIAGNOSIS — E669 Obesity, unspecified: Secondary | ICD-10-CM | POA: Insufficient documentation

## 2015-12-23 MED ORDER — CLINDAMYCIN PHOSPHATE 1 % EX SOLN: Freq: Two times a day (BID) | CUTANEOUS | 1 refills | Status: DC

## 2015-12-23 NOTE — Progress Notes (Signed)
Prenatal Visit Note Date: 12/23/2015 Clinic: [facility]  Subjective:  Victoria Rodriguez is a 35 y.o. [phone] at [redacted]w[redacted]d being seen today for ongoing prenatal care.  She is currently monitored for the following issues for this high-risk pregnancy and has Obesity affecting pregnancy in third trimester; Keratosis punctata; Chronic Recurrent Hidradenitis Suppurativa; Advanced maternal age in multigravida; Supervision of high risk pregnancy, antepartum; and BMI 40.0-44.9, adult ([name]) on her problem list.  Patient reports she feels like she has a flare of her hidradenitis in her b/l axilla and GU area.   . Vag. Bleeding: None.  Movement: Present. Denies leaking of fluid.   The following portions of the patient's history were reviewed and updated as appropriate: allergies, current medications, past family history, past medical history, past social history, past surgical history and problem list. Problem list updated.  Objective:   Vitals:   12/23/15 0940  BP: 119/79  Pulse: 91  Weight: 227 lb (103 kg)    Fetal Status: Fetal Heart Rate (bpm): 153   Movement: Present     General:  Alert, oriented and cooperative. Patient is in no acute distress.  Skin: Skin is warm and dry. No rash noted.   Cardiovascular: Normal heart rate noted  Respiratory: Normal respiratory effort, no problems with respiration noted  Abdomen: Soft, gravid, appropriate for gestational age. Pain/Pressure: Absent     GU: :  mons HA and some in the bilateral inguinal crease. Nttp. No e/o infection  Extremities: Similar to above but not as intense as above. Areas c/w old healed surgical sites in both areas  Mental Status: Normal mood and affect. Normal behavior. Normal judgment and thought content.   Urinalysis: Urine Protein: Trace Urine Glucose: Negative  Assessment and Plan:  Pregnancy: G5P1031 at [redacted]w[redacted]d  1. BMI 40.0-44.9, adult ([name]) Routine care.   2. Chronic Recurrent Hidradenitis Suppurativa Pt seen by surgical  practice Dr. Ninfa [place]en and she states she was going to have repeat surgery in her axilla but she got pregnant. I advised her to give them a call to see about any interventions and to possible set up something for postpartum. In the GU area, it looks like mild disease and bid clindamycin 1% solution was given to the patient.   3. Supervision of high risk pregnancy, antepartum, third trimester Routine care. GBS, GC/CT nv. Undecided on BC  4. Advanced maternal age in multigravida, third trimester See problem list. No issues  Preterm labor symptoms and general obstetric precautions including but not limited to vaginal bleeding, contractions, leaking of fluid and fetal movement were reviewed in detail with the patient. Please refer to After Visit Summary for other counseling recommendations.  Return in about 10 days (around 01/02/2016).   Aletha Halim, MD

## 2016-01-06 ENCOUNTER — Ambulatory Visit (INDEPENDENT_AMBULATORY_CARE_PROVIDER_SITE_OTHER): Payer: Medicaid Other | Admitting: Obstetrics & Gynecology

## 2016-01-06 VITALS — BP 122/81 | HR 79 | Wt 233.0 lb

## 2016-01-06 DIAGNOSIS — Z302 Encounter for sterilization: Secondary | ICD-10-CM

## 2016-01-06 DIAGNOSIS — O3663X1 Maternal care for excessive fetal growth, third trimester, fetus 1: Secondary | ICD-10-CM | POA: Diagnosis not present

## 2016-01-06 DIAGNOSIS — O26843 Uterine size-date discrepancy, third trimester: Secondary | ICD-10-CM

## 2016-01-06 DIAGNOSIS — Z3493 Encounter for supervision of normal pregnancy, unspecified, third trimester: Secondary | ICD-10-CM | POA: Diagnosis not present

## 2016-01-06 DIAGNOSIS — Z3A37 37 weeks gestation of pregnancy: Secondary | ICD-10-CM

## 2016-01-06 DIAGNOSIS — O09523 Supervision of elderly multigravida, third trimester: Secondary | ICD-10-CM

## 2016-01-06 NOTE — Progress Notes (Signed)
Subjective:  Victoria Rodriguez is a 35 y.o. [phone] at [redacted]w[redacted]d being seen today for ongoing prenatal care.  She is currently monitored for the following issues for this low-risk pregnancy and has Obesity affecting pregnancy in third trimester; Keratosis punctata; Chronic Recurrent Hidradenitis Suppurativa; Advanced maternal age in multigravida; Supervision of normal pregnancy; and BMI 40.0-44.9, adult ([place]) on her problem list.  Patient reports no complaints.  Contractions: Not present. Vag. Bleeding: None.  Movement: Present. Denies leaking of fluid.   The following portions of the patient's history were reviewed and updated as appropriate: allergies, current medications, past family history, past medical history, past social history, past surgical history and problem list. Problem list updated.  Objective:   Vitals:   01/06/16 0959  BP: 122/81  Pulse: 79  Weight: 233 lb (105.7 kg)    Fetal Status: Fetal Heart Rate (bpm): 145 Fundal Height: 40 cm Movement: Present  Presentation: Vertex  General:  Alert, oriented and cooperative. Patient is in no acute distress.  Skin: Skin is warm and dry. No rash noted.   Cardiovascular: Normal heart rate noted  Respiratory: Normal respiratory effort, no problems with respiration noted  Abdomen: Soft, gravid, appropriate for gestational age. Pain/Pressure: Absent     Pelvic:  Cervical exam performed Dilation: 1 Effacement (%): Thick Station: -3  Extremities: Normal range of motion.  Edema: Trace  Mental Status: Normal mood and affect. Normal behavior. Normal judgment and thought content.   Urinalysis: Urine Protein: Trace Urine Glucose: Negative  Assessment and Plan:  Pregnancy: G5P1031 at [redacted]w[redacted]d  1. Large for dates affecting management of mother, third trimester, fetus 1 Will follow up growth and AFI. - [place] MFM OB FOLLOW UP; Future  2. Request for sterilization Papers signed today  3. Supervision of normal pregnancy, third trimester Pelvic  cultures done today. - Strep Gp B NAA - GC/Chlamydia Probe Amp Term labor symptoms and general obstetric precautions including but not limited to vaginal bleeding, contractions, leaking of fluid and fetal movement were reviewed in detail with the patient. Please refer to After Visit Summary for other counseling recommendations.  Return in about 1 week (around 01/13/2016) for OB Visit.   Osborne Oman, MD

## 2016-01-07 LAB — GC/CHLAMYDIA PROBE AMP
Chlamydia trachomatis, NAA: NEGATIVE
Neisseria gonorrhoeae by PCR: NEGATIVE

## 2016-01-08 LAB — STREP GP B NAA: Strep Gp B NAA: POSITIVE — AB

## 2016-01-09 ENCOUNTER — Encounter (HOSPITAL_COMMUNITY): Payer: Self-pay | Admitting: *Deleted

## 2016-01-09 ENCOUNTER — Inpatient Hospital Stay (HOSPITAL_COMMUNITY)
Admission: AD | Admit: 2016-01-09 | Discharge: 2016-01-09 | Disposition: A | Discharge status: Home / Self Care | Payer: Medicaid Other | Source: Ambulatory Visit | Attending: Family Medicine | Admitting: Family Medicine

## 2016-01-09 ENCOUNTER — Ambulatory Visit (HOSPITAL_COMMUNITY)
Admission: RE | Admit: 2016-01-09 | Discharge: 2016-01-09 | Disposition: A | Discharge status: Home / Self Care | Payer: Medicaid Other | Source: Ambulatory Visit | Attending: Obstetrics & Gynecology | Admitting: Obstetrics & Gynecology

## 2016-01-09 ENCOUNTER — Encounter: Payer: Self-pay | Admitting: Obstetrics & Gynecology

## 2016-01-09 DIAGNOSIS — Z3A37 37 weeks gestation of pregnancy: Secondary | ICD-10-CM | POA: Insufficient documentation

## 2016-01-09 DIAGNOSIS — R42 Dizziness and giddiness: Secondary | ICD-10-CM

## 2016-01-09 DIAGNOSIS — O26843 Uterine size-date discrepancy, third trimester: Secondary | ICD-10-CM | POA: Diagnosis not present

## 2016-01-09 DIAGNOSIS — O26893 Other specified pregnancy related conditions, third trimester: Secondary | ICD-10-CM | POA: Diagnosis not present

## 2016-01-09 DIAGNOSIS — O09523 Supervision of elderly multigravida, third trimester: Secondary | ICD-10-CM | POA: Diagnosis not present

## 2016-01-09 DIAGNOSIS — R55 Syncope and collapse: Secondary | ICD-10-CM | POA: Diagnosis present

## 2016-01-09 DIAGNOSIS — Z3A27 27 weeks gestation of pregnancy: Secondary | ICD-10-CM | POA: Diagnosis not present

## 2016-01-09 DIAGNOSIS — O3663X1 Maternal care for excessive fetal growth, third trimester, fetus 1: Secondary | ICD-10-CM

## 2016-01-09 DIAGNOSIS — O99820 Streptococcus B carrier state complicating pregnancy: Secondary | ICD-10-CM | POA: Insufficient documentation

## 2016-01-09 LAB — COMPREHENSIVE METABOLIC PANEL
ALT: 10 U/L — ABNORMAL LOW (ref 14–54)
AST: 15 U/L (ref 15–41)
Albumin: 2.9 g/dL — ABNORMAL LOW (ref 3.5–5.0)
Alkaline Phosphatase: 134 U/L — ABNORMAL HIGH (ref 38–126)
Anion gap: 7 (ref 5–15)
CHLORIDE: 106 mmol/L (ref 101–111)
CO2: 19 mmol/L — ABNORMAL LOW (ref 22–32)
Calcium: 9.2 mg/dL (ref 8.9–10.3)
Creatinine, Ser: 0.51 mg/dL (ref 0.44–1.00)
Glucose, Bld: 97 mg/dL (ref 65–99)
POTASSIUM: 3.8 mmol/L (ref 3.5–5.1)
Sodium: 132 mmol/L — ABNORMAL LOW (ref 135–145)
Total Bilirubin: 0.5 mg/dL (ref 0.3–1.2)
Total Protein: 7.5 g/dL (ref 6.5–8.1)

## 2016-01-09 LAB — URINALYSIS, ROUTINE W REFLEX MICROSCOPIC
BILIRUBIN URINE: NEGATIVE
Glucose, UA: NEGATIVE mg/dL
HGB URINE DIPSTICK: NEGATIVE
Ketones, ur: NEGATIVE mg/dL
Leukocytes, UA: NEGATIVE
Nitrite: NEGATIVE
Protein, ur: NEGATIVE mg/dL
pH: 5.5 (ref 5.0–8.0)

## 2016-01-09 LAB — CBC
HCT: 35.2 % — ABNORMAL LOW (ref 36.0–46.0)
Hemoglobin: 11.8 g/dL — ABNORMAL LOW (ref 12.0–15.0)
MCH: 26.8 pg (ref 26.0–34.0)
MCHC: 33.5 g/dL (ref 30.0–36.0)
MCV: 80 fL (ref 78.0–100.0)
PLATELETS: 312 10*3/uL (ref 150–400)
RBC: 4.4 MIL/uL (ref 3.87–5.11)
RDW: 14.8 % (ref 11.5–15.5)
WBC: 6.8 10*3/uL (ref 4.0–10.5)

## 2016-01-09 LAB — GLUCOSE, CAPILLARY: GLUCOSE-CAPILLARY: 99 mg/dL (ref 65–99)

## 2016-01-09 NOTE — MAU Note (Signed)
C/o feeling lightheaded and fainty since this morning;

## 2016-01-09 NOTE — MAU Provider Note (Signed)
History     CSN: [id number]  Arrival date and time: 01/09/16 1327   None     Chief Complaint  Patient presents with  . Near Syncope   HPI   Victoria Rodriguez is a 35 y.o. female [phone] at 57w2dhere with dizziness. She was seen in MFM today for an [place] growth (size > dates). She decided after her [place] come get checked out for the dizziness. When she got up this morning at 0800 she felt faintish, so she went back to bed. When she got up at 12:00 she again felt faintish so she ate breakfast; this helped her symptoms some.  She also describes that when she is laying in the bed she feels "queesy" and when she stands up she feels dizzy at times.   + fetal movement, denies vaginal bleeding.   Diet recall: Egg and bacon sand which at 1000. She has had 2 bottles of water and 1 cup of juice.   OB History    Gravida Para Term Preterm AB Living   5 1 1  0 3 1   SAB TAB Ectopic Multiple Live Births   1 2            Obstetric Comments   2000: 6lbs 9oz TSVD      Past Medical History:  Diagnosis Date  . Abscess of breast 05/14/2015  . Chlamydia contact, treated   . Hidradenitis suppurativa    surgical  . Microscopic hematuria 03/21/2015   03/20/15 urine micro with 5-10 RBC. Recommend recheck UA & micro at next office visit.     Past Surgical History:  Procedure Laterality Date  . AXILLARY HIDRADENITIS EXCISION  2011   bilateral  . DILATION AND CURETTAGE OF UTERUS    . HYDRADENITIS EXCISION Left 02/12/2014   Procedure: EXCISION HIDRADENITIS AXILLA;  Surgeon: DCoralie Keens MD;  Location: [facility]  Service: General;  Laterality: Left;  . IRRIGATION AND DEBRIDEMENT ABSCESS Left 11/16/2013   Procedure: IRRIGATION AND DEBRIDEMENT LEFT AXILLARY ABSCESS;  Surgeon: MPedro Earls MD;  Location: [facility];  Service: General;  Laterality: Left;    Family History  Problem Relation Age of Onset  . Asthma Mother   . Diabetes Father   . Asthma Sister   . Asthma  Brother   . Cancer Neg Hx   . Heart disease Neg Hx   . Stroke Neg Hx     Social History  Substance Use Topics  . Smoking status: Never Smoker  . Smokeless tobacco: Never Used  . Alcohol use No     Comment: drinks monthly or less.when not pregnant     Allergies:  Allergies  Allergen Reactions  . Vicodin [Hydrocodone-Acetaminophen] Nausea Only    Prescriptions Prior to Admission  Medication Sig Dispense Refill Last Dose  . clindamycin (CLEOCIN-T) 1 % external solution Apply topically 2 (two) times daily. 30 mL 1 Taking  . penicillin v potassium (VEETID) 250 MG tablet Take 250 mg by mouth 3 (three) times daily. Reported on 12/09/2015   Not Taking  . Prenatal Vit-Fe Fumarate-FA (MULTIVITAMIN-PRENATAL) 27-0.8 MG TABS tablet Take 1 tablet by mouth daily at 12 noon.   Taking  . terconazole (TERAZOL 7) 0.4 % vaginal cream Place 1 applicator vaginally at bedtime. (Patient not taking: Reported on 12/09/2015) 45 g 0 Not Taking   Results for orders placed or performed during the hospital encounter of 01/09/16 (from the past 48 hour(s))  Urinalysis, Routine w reflex microscopic (  not at [facility])     Status: Abnormal   Collection Time: 01/09/16  1:40 PM  Result Value Ref Range   Color, Urine YELLOW YELLOW   APPearance CLEAR CLEAR   Specific Gravity, Urine <1.005 (L) 1.005 - 1.030   pH 5.5 5.0 - 8.0   Glucose, UA NEGATIVE NEGATIVE mg/dL   Hgb urine dipstick NEGATIVE NEGATIVE   Bilirubin Urine NEGATIVE NEGATIVE   Ketones, ur NEGATIVE NEGATIVE mg/dL   Protein, ur NEGATIVE NEGATIVE mg/dL   Nitrite NEGATIVE NEGATIVE   Leukocytes, UA NEGATIVE NEGATIVE    Comment: MICROSCOPIC NOT DONE ON URINES WITH NEGATIVE PROTEIN, BLOOD, LEUKOCYTES, NITRITE, OR GLUCOSE <1000 mg/dL.  CBC     Status: Abnormal   Collection Time: 01/09/16  2:16 PM  Result Value Ref Range   WBC 6.8 4.0 - 10.5 K/uL   RBC 4.40 3.87 - 5.11 MIL/uL   Hemoglobin 11.8 (L) 12.0 - 15.0 g/dL   HCT 35.2 (L) 36.0 - 46.0 %   MCV 80.0 78.0 -  100.0 fL   MCH 26.8 26.0 - 34.0 pg   MCHC 33.5 30.0 - 36.0 g/dL   RDW 14.8 11.5 - 15.5 %   Platelets 312 150 - 400 K/uL  Comprehensive metabolic panel     Status: Abnormal   Collection Time: 01/09/16  2:16 PM  Result Value Ref Range   Sodium 132 (L) 135 - 145 mmol/L   Potassium 3.8 3.5 - 5.1 mmol/L   Chloride 106 101 - 111 mmol/L   CO2 19 (L) 22 - 32 mmol/L   Glucose, Bld 97 65 - 99 mg/dL   BUN <5 (L) 6 - 20 mg/dL    Comment: REPEATED TO VERIFY   Creatinine, Ser 0.51 0.44 - 1.00 mg/dL   Calcium 9.2 8.9 - 10.3 mg/dL   Total Protein 7.5 6.5 - 8.1 g/dL   Albumin 2.9 (L) 3.5 - 5.0 g/dL   AST 15 15 - 41 U/L   ALT 10 (L) 14 - 54 U/L   Alkaline Phosphatase 134 (H) 38 - 126 U/L   Total Bilirubin 0.5 0.3 - 1.2 mg/dL   GFR calc non Af Amer >60 >60 mL/min   GFR calc Af Amer >60 >60 mL/min    Comment: (NOTE) The eGFR has been calculated using the CKD EPI equation. This calculation has not been validated in all clinical situations. eGFR's persistently <60 mL/min signify possible Chronic Kidney Disease.    Anion gap 7 5 - 15  Glucose, capillary     Status: None   Collection Time: 01/09/16  2:48 PM  Result Value Ref Range   Glucose-Capillary 99 65 - 99 mg/dL    Review of Systems  Eyes: Negative for blurred vision.  Respiratory: Negative for shortness of breath.   Cardiovascular: Negative for chest pain, palpitations and leg swelling.  Neurological: Positive for dizziness. Negative for headaches (Occasional, none now. Has never tried taking tylenol for the symptoms ).   Physical Exam   Blood pressure 105/63, pulse 89, temperature 98.6 F (37 C), temperature source Axillary, resp. rate 18, last menstrual period 04/23/2015, unknown if currently breastfeeding.   Patient Vitals for the past 24 hrs:  BP Temp Temp src Pulse Resp  01/09/16 1535 105/63 - - 89 -  01/09/16 1416 132/86 - - 90 -  01/09/16 1406 119/84 - - 97 -  01/09/16 1405 129/92 - - 90 -  01/09/16 1404 123/67 - - 89 -   01/09/16 1401 126/83 - - 96 -  01/09/16 1350 137/76 98.6 F (37 C) Axillary 90 18  01/09/16 1349 137/76 - - 90 -    Physical Exam  Constitutional: She is oriented to person, place, and time. She appears well-developed and well-nourished. No distress.  HENT:  Head: Normocephalic.  Eyes: Pupils are equal, round, and reactive to light.  Neck: Neck supple.  Cardiovascular: Normal rate and normal heart sounds.   Musculoskeletal: Normal range of motion.  Neurological: She is alert and oriented to person, place, and time.  Skin: Skin is warm. She is not diaphoretic.  Psychiatric: Her behavior is normal.   Fetal Tracing: Baseline: 140 bpm  Variability: Moderate  Accelerations:  15x15 Decelerations: none Toco: none  MAU Course  Procedures  None  MDM  CGB Orthostatic vitals normal    Assessment and Plan   A:  1. Episode of dizziness     P:  Discharge home in stable condition Change positions slowly Stay well hydrated Return to MAU if symptoms worsen  Lezlie Lye, NP 01/09/2016 6:47 PM

## 2016-01-09 NOTE — Discharge Instructions (Signed)

## 2016-01-13 ENCOUNTER — Ambulatory Visit (INDEPENDENT_AMBULATORY_CARE_PROVIDER_SITE_OTHER): Payer: Medicaid Other | Admitting: Obstetrics and Gynecology

## 2016-01-13 VITALS — BP 136/86 | HR 100 | Temp 98.4°F | Wt 231.6 lb

## 2016-01-13 DIAGNOSIS — Z331 Pregnant state, incidental: Secondary | ICD-10-CM

## 2016-01-13 DIAGNOSIS — Z3493 Encounter for supervision of normal pregnancy, unspecified, third trimester: Secondary | ICD-10-CM | POA: Diagnosis not present

## 2016-01-13 DIAGNOSIS — O99820 Streptococcus B carrier state complicating pregnancy: Secondary | ICD-10-CM

## 2016-01-13 DIAGNOSIS — O09523 Supervision of elderly multigravida, third trimester: Secondary | ICD-10-CM | POA: Diagnosis not present

## 2016-01-13 DIAGNOSIS — Z1389 Encounter for screening for other disorder: Secondary | ICD-10-CM

## 2016-01-13 DIAGNOSIS — O99213 Obesity complicating pregnancy, third trimester: Secondary | ICD-10-CM | POA: Diagnosis not present

## 2016-01-13 LAB — POCT URINALYSIS DIPSTICK
Bilirubin, UA: NEGATIVE
GLUCOSE UA: NEGATIVE
Ketones, UA: NEGATIVE
NITRITE UA: NEGATIVE
Spec Grav, UA: 1.01
UROBILINOGEN UA: 0.2
pH, UA: 6

## 2016-01-13 NOTE — Progress Notes (Signed)
Subjective:  Victoria Rodriguez is a 35 y.o. [phone] at [redacted]w[redacted]d being seen today for ongoing prenatal care.  She is currently monitored for the following issues for this low-risk pregnancy and has Obesity affecting pregnancy in third trimester; Keratosis punctata; Chronic Recurrent Hidradenitis Suppurativa; Advanced maternal age in multigravida; Supervision of normal pregnancy; BMI 40.0-44.9, adult (Victoria Rodriguez); and Group B Streptococcus carrier, +RV culture, currently pregnant on her problem list.  Patient reports fatigue and headache. Headache is relieved by tylenol. Contractions: Not present. Vag. Bleeding: None.  Movement: Present. Denies leaking of fluid.   The following portions of the patient's history were reviewed and updated as appropriate: allergies, current medications, past family history, past medical history, past social history, past surgical history and problem list. Problem list updated.  Objective:   Vitals:   01/13/16 0902  BP: 136/86  Pulse: 100  Temp: 98.4 F (36.9 C)  Weight: 231 lb 9.6 oz (105.1 kg)    Fetal Status: Fetal Heart Rate (bpm): 140 Fundal Height: 39 cm Movement: Present     General:  Alert, oriented and cooperative. Patient is in no acute distress.  Skin: Skin is warm and dry. No rash noted.   Cardiovascular: Normal heart rate noted  Respiratory: Normal respiratory effort, no problems with respiration noted  Abdomen: Soft, gravid, appropriate for gestational age. Pain/Pressure: Absent     Pelvic:  Cervical exam deferred        Extremities: Normal range of motion.  Edema: Trace  Mental Status: Normal mood and affect. Normal behavior. Normal judgment and thought content.   Urinalysis: Urine Protein: Trace Urine Glucose: Negative  Assessment and Plan:  Pregnancy: G5P1031 at [redacted]w[redacted]d  1. Prenatal care, third trimester - POCT Urinalysis Dipstick  2. Supervision of normal pregnancy, third trimester Advised patient to monitor headaches and to present to MAU if  not relieved by tylenol Discussed staying well hydrated and to eat well to avoid hypoglycemia which can result in feeling faint/fatigue/dizzy  3. Obesity affecting pregnancy in third trimester   4. Group B Streptococcus carrier, +RV culture, currently pregnant Will receive prophylaxis in labor  5. Advanced maternal age in multigravida, third trimester   Term labor symptoms and general obstetric precautions including but not limited to vaginal bleeding, contractions, leaking of fluid and fetal movement were reviewed in detail with the patient. Please refer to After Visit Summary for other counseling recommendations.  Return in about 1 week (around 01/20/2016) for [place].   Mora Bellman, MD

## 2016-01-13 NOTE — Progress Notes (Signed)
Pt c/o dizziness, fatigue, and intermittent headaches since 01/09/16, was seen in MAU.  Pt c/o slight dizziness today.

## 2016-01-14 ENCOUNTER — Inpatient Hospital Stay (HOSPITAL_COMMUNITY)
Admission: AD | Admit: 2016-01-14 | Discharge: 2016-01-14 | Disposition: A | Discharge status: Home / Self Care | Payer: Medicaid Other | Source: Ambulatory Visit | Attending: Family Medicine | Admitting: Family Medicine

## 2016-01-14 DIAGNOSIS — O26893 Other specified pregnancy related conditions, third trimester: Secondary | ICD-10-CM | POA: Diagnosis not present

## 2016-01-14 DIAGNOSIS — Z3A38 38 weeks gestation of pregnancy: Secondary | ICD-10-CM | POA: Diagnosis not present

## 2016-01-14 DIAGNOSIS — O9989 Other specified diseases and conditions complicating pregnancy, childbirth and the puerperium: Secondary | ICD-10-CM | POA: Diagnosis not present

## 2016-01-14 DIAGNOSIS — R51 Headache: Secondary | ICD-10-CM | POA: Diagnosis not present

## 2016-01-14 DIAGNOSIS — H8110 Benign paroxysmal vertigo, unspecified ear: Secondary | ICD-10-CM | POA: Diagnosis not present

## 2016-01-14 LAB — URINE MICROSCOPIC-ADD ON

## 2016-01-14 LAB — URINALYSIS, ROUTINE W REFLEX MICROSCOPIC
Bilirubin Urine: NEGATIVE
GLUCOSE, UA: 100 mg/dL — AB
KETONES UR: NEGATIVE mg/dL
LEUKOCYTES UA: NEGATIVE
Nitrite: NEGATIVE
PH: 6 (ref 5.0–8.0)
Protein, ur: NEGATIVE mg/dL
Specific Gravity, Urine: 1.025 (ref 1.005–1.030)

## 2016-01-14 MED ORDER — MECLIZINE HCL 25 MG PO TABS: 25.0000 mg | ORAL_TABLET | Freq: Three times a day (TID) | ORAL | 0 refills | Status: DC | PRN

## 2016-01-14 MED ORDER — MECLIZINE HCL 25 MG PO TABS
25.0000 mg | ORAL_TABLET | Freq: Once | ORAL | Status: AC
Start: 2016-01-14 — End: 2016-01-14
  Administered 2016-01-14: 25 mg via ORAL
  Filled 2016-01-14: qty 1

## 2016-01-14 NOTE — MAU Provider Note (Signed)
Chief Complaint:  Dizziness; Headache; and Nausea    HPI: Victoria Rodriguez is a 35 y.o. [phone] at [redacted]w[redacted]d who presents to maternity admissions reporting dizziness, HA.  Dizziness described as room spinning, with occasional lightheadedness. Not positional. Lightheaded throughout day, dizziness 2-3 times per day, lasts about 2-3 seconds. No LOC or falls. No tinnitus or loss of hearing. Dizziness preceded headaches.   Only drinking about 3-4 bottles of water per day.  Headache-  Location: frontal Quality: dull throbbing Severity: 7/10 in pain scale Duration: 5 days Context: Nothing brings it on that she can associate it with. Timing: during day, able to sleep, does not wake with headache, comes on later Modifying factors: Nothing makes dizziness or headaches worse.  Associated signs and symptoms: Dizziness, denies N/V. Some mild blurry vision.   Denies contractions, leakage of fluid or vaginal bleeding. Good fetal movement.   Pregnancy Course: No hypertension, no diabetes. Normal pregnancy course.  Past Medical History: Past Medical History:  Diagnosis Date  . Abscess of breast 05/14/2015  . Chlamydia contact, treated   . Hidradenitis suppurativa    surgical  . Microscopic hematuria 03/21/2015   03/20/15 urine micro with 5-10 RBC. Recommend recheck UA & micro at next office visit.     Past obstetric history: OB History  Gravida Para Term Preterm AB Living  5 1 1  0 3 1  SAB TAB Ectopic Multiple Live Births  1 2          # Outcome Date GA Lbr Len/2nd Weight Sex Delivery Anes PTL Lv  5 Current           4 Term 05/11/99     Vag-Spont     3 SAB           2 TAB           1 TAB             Obstetric Comments  2000: 6lbs 9oz TSVD    Past Surgical History: Past Surgical History:  Procedure Laterality Date  . AXILLARY HIDRADENITIS EXCISION  2011   bilateral  . DILATION AND CURETTAGE OF UTERUS    . HYDRADENITIS EXCISION Left 02/12/2014   Procedure: EXCISION HIDRADENITIS  AXILLA;  Surgeon: Coralie Keens, MD;  Location: [place];  Service: General;  Laterality: Left;  . IRRIGATION AND DEBRIDEMENT ABSCESS Left 11/16/2013   Procedure: IRRIGATION AND DEBRIDEMENT LEFT AXILLARY ABSCESS;  Surgeon: Pedro Earls, MD;  Location: [facility];  Service: General;  Laterality: Left;     Family History: Family History  Problem Relation Age of Onset  . Asthma Mother   . Diabetes Father   . Asthma Sister   . Asthma Brother   . Cancer Neg Hx   . Heart disease Neg Hx   . Stroke Neg Hx     Social History: Social History  Substance Use Topics  . Smoking status: Never Smoker  . Smokeless tobacco: Never Used  . Alcohol use No     Comment: drinks monthly or less.when not pregnant     Allergies:  Allergies  Allergen Reactions  . Vicodin [Hydrocodone-Acetaminophen] Nausea Only    Meds:  Prescriptions Prior to Admission  Medication Sig Dispense Refill Last Dose  . calcium carbonate (TUMS - DOSED IN MG ELEMENTAL CALCIUM) 500 MG chewable tablet Chew 1 tablet by mouth daily as needed for indigestion or heartburn.   Past Week at Unknown time  . Prenatal Vit-Fe Fumarate-FA (MULTIVITAMIN-PRENATAL) 27-0.8 MG TABS tablet  Take 1 tablet by mouth daily at 12 noon.   Past Month at Unknown time    I have reviewed patient's Past Medical Hx, Surgical Hx, Family Hx, Social Hx, medications and allergies.   ROS:  Review of Systems  REVIEW OF SYSTEMS  OPHTHALMIC: negative for - blurry vision, decreased vision, double vision, photophobia or scotomata RESPIRATORY: no cough, shortness of breath, or wheezing CARDIOVASCULAR: no chest pain or dyspnea on exertion GASTROINTESTINAL: no abdominal pain, change in bowel habits, or black or bloody stools negative for - epigastric or RUQ pain GENITO-URINARY: no dysuria, trouble voiding, or hematuria negative for - genital discharge, vulvar/vaginal symptoms or vaginal bleeding MUSKULOSKELETAL: negative for - gait disturbance  or swelling in ankle - bilateral, foot - bilateral and leg - bilateral NEUROLOGICAL: positive for - dizziness, gait disturbance, headaches; negative for -  numbness/tingling or visual changes. DERMATOLOGICAL: negative OBSTETRICAL: No vaginal bleeding, no leaking fluid, no contractions. Good fetal movement.   Physical Exam  Patient Vitals for the past 24 hrs:  BP Temp Temp src Pulse Resp SpO2  01/14/16 1636 - - - 95 - 100 %  01/14/16 1631 - - - 95 - 100 %  01/14/16 1626 - - - 90 - 100 %  01/14/16 1621 - - - 101 - 100 %  01/14/16 1616 - - - 94 - 99 %  01/14/16 1612 116/70 - - 102 - -  01/14/16 1610 106/69 - - 111 - -  01/14/16 1609 119/70 - - 97 - -  01/14/16 1608 130/77 - - 94 - -  01/14/16 1558 128/76 98.4 F (36.9 C) Oral 95 18 99 %   Constitutional: Well-developed, well-nourished female in no acute distress.  Cardiovascular: normal rate Respiratory: normal effort GI: Abd soft, non-tender, gravid appropriate for gestational age. Pos BS x 4 MS: Extremities nontender, no edema, normal ROM Neurologic: Alert and oriented x 4.  GU: Neg CVAT.  Pelvic: Not performed FHT:  Baseline 140 , moderate variability, accelerations present, no decelerations Contractions: rare, occasional   Labs: Results for orders placed or performed during the hospital encounter of 01/14/16 (from the past 24 hour(s))  Urinalysis, Routine w reflex microscopic (not at [facility])     Status: Abnormal   Collection Time: 01/14/16  4:08 PM  Result Value Ref Range   Color, Urine YELLOW YELLOW   APPearance CLEAR CLEAR   Specific Gravity, Urine 1.025 1.005 - 1.030   pH 6.0 5.0 - 8.0   Glucose, UA 100 (A) NEGATIVE mg/dL   Hgb urine dipstick TRACE (A) NEGATIVE   Bilirubin Urine NEGATIVE NEGATIVE   Ketones, ur NEGATIVE NEGATIVE mg/dL   Protein, ur NEGATIVE NEGATIVE mg/dL   Nitrite NEGATIVE NEGATIVE   Leukocytes, UA NEGATIVE NEGATIVE  Urine microscopic-add on     Status: Abnormal   Collection Time: 01/14/16  4:08 PM   Result Value Ref Range   Squamous Epithelial / LPF 6-30 (A) NONE SEEN   WBC, UA 0-5 0 - 5 WBC/hpf   RBC / HPF 0-5 0 - 5 RBC/hpf   Bacteria, UA MANY (A) NONE SEEN    Imaging:  [place] [facility] Ob Follow Up  Result Date: 01/09/2016 OBSTETRICAL ULTRASOUND: This exam was performed within a [facility]. The OB US report was generated in the [facility] system, and faxed to the ordering physician.  This report is available in the [employer]. See the [facility] Obstetric US report via the Image Link.   MAU Course:  6:27 PM - Rechecked  patient. Symptoms have resolved after meclizine. Sending home with meclizine prescription.  I personally reviewed the patient's NST today, found to be REACTIVE.   MDM: Plan of care reviewed with patient, including labs and tests ordered and medical treatment.   Assessment: 1. Benign paroxysmal positional vertigo, unspecified laterality     Plan: Discharge with Rx for meclizine. If symptoms worsen with medication, recommended to see ENT or PCP. Discharge home in stable condition.  Labor precautions and fetal kick counts.     Medication List    TAKE these medications   calcium carbonate 500 MG chewable tablet Commonly known as:  TUMS - dosed in mg elemental calcium Chew 1 tablet by mouth daily as needed for indigestion or heartburn.   meclizine 25 MG tablet Commonly known as:  ANTIVERT Take 1 tablet (25 mg total) by mouth 3 (three) times daily as needed for dizziness.   multivitamin-prenatal 27-0.8 MG Tabs tablet Take 1 tablet by mouth daily at 12 noon.       [place], [place] 01/14/2016 4:59 PM

## 2016-01-14 NOTE — Discharge Instructions (Signed)
Dizziness Dizziness is a common problem. It is a feeling of unsteadiness or light-headedness. You may feel like you are about to faint. Dizziness can lead to injury if you stumble or fall. Anyone can become dizzy, but dizziness is more common in older adults. This condition can be caused by a number of things, including medicines, dehydration, or illness. HOME CARE INSTRUCTIONS Taking these steps may help with your condition: Eating and Drinking  Drink enough fluid to keep your urine clear or pale yellow. This helps to keep you from becoming dehydrated. Try to drink more clear fluids, such as water.  Do not drink alcohol.  Limit your caffeine intake if directed by your health care provider.  Limit your salt intake if directed by your health care provider. Activity  Avoid making quick movements.  Rise slowly from chairs and steady yourself until you feel okay.  In the morning, first sit up on the side of the bed. When you feel okay, stand slowly while you hold onto something until you know that your balance is fine.  Move your legs often if you need to stand in one place for a long time. Tighten and relax your muscles in your legs while you are standing.  Do not drive or operate heavy machinery if you feel dizzy.  Avoid bending down if you feel dizzy. Place items in your home so that they are easy for you to reach without leaning over. Lifestyle  Do not use any tobacco products, including cigarettes, chewing tobacco, or electronic cigarettes. If you need help quitting, ask your health care provider.  Try to reduce your stress level, such as with yoga or meditation. Talk with your health care provider if you need help. General Instructions  Watch your dizziness for any changes.  Take medicines only as directed by your health care provider. Talk with your health care provider if you think that your dizziness is caused by a medicine that you are taking.  Tell a friend or a family  member that you are feeling dizzy. If he or she notices any changes in your behavior, have this person call your health care provider.  Keep all follow-up visits as directed by your health care provider. This is important. SEEK MEDICAL CARE IF:  Your dizziness does not go away.  Your dizziness or light-headedness gets worse.  You feel nauseous.  You have reduced hearing.  You have new symptoms.  You are unsteady on your feet or you feel like the room is spinning. SEEK IMMEDIATE MEDICAL CARE IF:  You vomit or have diarrhea and are unable to eat or drink anything.  You have problems talking, walking, swallowing, or using your arms, hands, or legs.  You feel generally weak.  You are not thinking clearly or you have trouble forming sentences. It may take a friend or family member to notice this.  You have chest pain, abdominal pain, shortness of breath, or sweating.  Your vision changes.  You notice any bleeding.  You have a headache.  You have neck pain or a stiff neck.  You have a fever.   This information is not intended to replace advice given to you by your health care provider. Make sure you discuss any questions you have with your health care provider.   Document Released: 11/10/2000 Document Revised: 10/01/2014 Document Reviewed: 05/13/2014 Elsevier Interactive Patient Education 2016 [employer].  [employer] Self-Care WHAT IS THE EPLEY MANEUVER? The Epley maneuver is an exercise you can do to  relieve symptoms of benign paroxysmal positional vertigo (BPPV). This condition is often just referred to as vertigo. BPPV is caused by the movement of tiny crystals (canaliths) inside your inner ear. The accumulation and movement of canaliths in your inner ear causes a sudden spinning sensation (vertigo) when you move your head to certain positions. Vertigo usually lasts about 30 seconds. BPPV usually occurs in just one ear. If you get vertigo when you lie on your left  side, you probably have BPPV in your left ear. Your health care provider can tell you which ear is involved.  BPPV may be caused by a head injury. Many people older than 50 get BPPV for unknown reasons. If you have been diagnosed with BPPV, your health care provider may teach you how to do this maneuver. BPPV is not life threatening (benign) and usually goes away in time.  WHEN SHOULD I PERFORM THE EPLEY MANEUVER? You can do this maneuver at home whenever you have symptoms of vertigo. You may do the Epley maneuver up to 3 times a day until your symptoms of vertigo go away. HOW SHOULD I DO THE EPLEY MANEUVER? 1. Sit on the edge of a bed or table with your back straight. Your legs should be extended or hanging over the edge of the bed or table.  2. Turn your head halfway toward the affected ear.  3. Lie backward quickly with your head turned until you are lying flat on your back. You may want to position a pillow under your shoulders.  4. Hold this position for 30 seconds. You may experience an attack of vertigo. This is normal. Hold this position until the vertigo stops. 5. Then turn your head to the opposite direction until your unaffected ear is facing the floor.  6. Hold this position for 30 seconds. You may experience an attack of vertigo. This is normal. Hold this position until the vertigo stops. 7. Now turn your whole body to the same side as your head. Hold for another 30 seconds.  8. You can then sit back up. ARE THERE RISKS TO THIS MANEUVER? In some cases, you may have other symptoms (such as changes in your vision, weakness, or numbness). If you have these symptoms, stop doing the maneuver and call your health care provider. Even if doing these maneuvers relieves your vertigo, you may still have dizziness. Dizziness is the sensation of light-headedness but without the sensation of movement. Even though the Epley maneuver may relieve your vertigo, it is possible that your symptoms will  return within 5 years. WHAT SHOULD I DO AFTER THIS MANEUVER? After doing the Epley maneuver, you can return to your normal activities. Ask your doctor if there is anything you should do at home to prevent vertigo. This may include:  Sleeping with two or more pillows to keep your head elevated.  Not sleeping on the side of your affected ear.  Getting up slowly from bed.  Avoiding sudden movements during the day.  Avoiding extreme head movement, like looking up or bending over.  Wearing a cervical collar to prevent sudden head movements. WHAT SHOULD I DO IF MY SYMPTOMS GET WORSE? Call your health care provider if your vertigo gets worse. Call your provider right way if you have other symptoms, including:   Nausea.  Vomiting.  Headache.  Weakness.  Numbness.  Vision changes.   This information is not intended to replace advice given to you by your health care provider. Make sure you discuss any questions  you have with your health care provider.   Document Released: 05/22/2013 Document Reviewed: 05/22/2013 Elsevier Interactive Patient Education [employer].

## 2016-01-14 NOTE — MAU Note (Signed)
Been feeling dizzy, lightheaded, headache and nauseated.Marland Kitchen  Has been feeling this way since Friday. Was here Friday, saw regular doctor on Tues.  Things seem worse today.

## 2016-01-20 ENCOUNTER — Ambulatory Visit (INDEPENDENT_AMBULATORY_CARE_PROVIDER_SITE_OTHER): Payer: Medicaid Other | Admitting: Obstetrics & Gynecology

## 2016-01-20 VITALS — BP 133/75 | HR 94 | Temp 98.5°F | Wt 234.4 lb

## 2016-01-20 DIAGNOSIS — Z3493 Encounter for supervision of normal pregnancy, unspecified, third trimester: Secondary | ICD-10-CM | POA: Diagnosis not present

## 2016-01-20 LAB — POCT URINALYSIS DIPSTICK
BILIRUBIN UA: NEGATIVE
GLUCOSE UA: NEGATIVE
Ketones, UA: NEGATIVE
Nitrite, UA: NEGATIVE
SPEC GRAV UA: 1.01
Urobilinogen, UA: 0.2
pH, UA: 6

## 2016-01-20 NOTE — Progress Notes (Signed)
Pt c/o vaginal pain and pressure. Subjective:  Victoria Rodriguez is a 35 y.o. [phone] at [redacted]w[redacted]d being seen today for ongoing prenatal care.  She is currently monitored for the following issues for this low-risk pregnancy and has Obesity affecting pregnancy in third trimester; Keratosis punctata; Chronic Recurrent Hidradenitis Suppurativa; Advanced maternal age in multigravida; Supervision of normal pregnancy; BMI 40.0-44.9, adult ([place]); and Group B Streptococcus carrier, +RV culture, currently pregnant on her problem list.  Patient reports occasional contractions.  Contractions: Irregular. Vag. Bleeding: None.  Movement: Present. Denies leaking of fluid.   The following portions of the patient's history were reviewed and updated as appropriate: allergies, current medications, past family history, past medical history, past social history, past surgical history and problem list. Problem list updated.  Objective:   Vitals:   01/20/16 1504  BP: 133/75  Pulse: 94  Temp: 98.5 F (36.9 C)  Weight: 234 lb 6.4 oz (106.3 kg)    Fetal Status:     Movement: Present     General:  Alert, oriented and cooperative. Patient is in no acute distress.  Skin: Skin is warm and dry. No rash noted.   Cardiovascular: Normal heart rate noted  Respiratory: Normal respiratory effort, no problems with respiration noted  Abdomen: Soft, gravid, appropriate for gestational age. Pain/Pressure: Present     Pelvic:  Cervical exam deferred        Extremities: Normal range of motion.  Edema: Mild pitting, slight indentation  Mental Status: Normal mood and affect. Normal behavior. Normal judgment and thought content.   Urinalysis:      Assessment and Plan:  Pregnancy: [id number] at [redacted]w[redacted]d  1. Prenatal care, third trimester Doing well - POCT Urinalysis Dipstick  2. Supervision of normal pregnancy, third trimester   Term labor symptoms and general obstetric precautions including but not limited to vaginal bleeding,  contractions, leaking of fluid and fetal movement were reviewed in detail with the patient. Please refer to After Visit Summary for other counseling recommendations.  Return in about 1 week (around 01/27/2016).   Woodroe Mode, MD

## 2016-01-20 NOTE — Patient Instructions (Signed)
Vaginal Delivery °During delivery, your health care provider will help you give birth to your baby. During a vaginal delivery, you will work to push the baby out of your vagina. However, before you can push your baby out, a few things need to happen. The opening of your uterus (cervix) has to soften, thin out, and open up (dilate) all the way to 10 cm. Also, your baby has to move down from the uterus into your vagina.  °SIGNS OF LABOR  °Your health care provider will first need to make sure you are in labor. Signs of labor include:  °· Passing what is called the mucous plug before labor begins. This is a small amount of blood-stained mucus. °· Having regular, painful uterine contractions.   °· The time between contractions gets shorter.   °· The discomfort and pain gradually get more intense. °· Contraction pains get worse when walking and do not go away when resting.   °· Your cervix becomes thinner (effacement) and dilates. °BEFORE THE DELIVERY °Once you are in labor and admitted into the hospital or care center, your health care provider may do the following:  °· Perform a complete physical exam. °· Review any complications related to pregnancy or labor.  °· Check your blood pressure, pulse, temperature, and heart rate (vital signs).   °· Determine if, and when, the rupture of amniotic membranes occurred. °· Do a vaginal exam (using a sterile glove and lubricant) to determine:   °¨ The position (presentation) of the baby. Is the baby's head presenting first (vertex) in the birth canal (vagina), or are the feet or buttocks first (breech)?   °¨ The level (station) of the baby's head within the birth canal.   °¨ The effacement and dilatation of the cervix.   °· An electronic fetal monitor is usually placed on your abdomen when you first arrive. This is used to monitor your contractions and the baby's heart rate. °¨ When the monitor is on your abdomen (external fetal monitor), it can only pick up the frequency and  length of your contractions. It cannot tell the strength of your contractions. °¨ If it becomes necessary for your health care provider to know exactly how strong your contractions are or to see exactly what the baby's heart rate is doing, an internal monitor may be inserted into your vagina and uterus. Your health care provider will discuss the benefits and risks of using an internal monitor and obtain your permission before inserting the device. °¨ Continuous fetal monitoring may be needed if you have an epidural, are receiving certain medicines (such as oxytocin), or have pregnancy or labor complications. °· An IV access tube may be placed into a vein in your arm to deliver fluids and medicines if necessary. °THREE STAGES OF LABOR AND DELIVERY °Normal labor and delivery is divided into three stages. °First Stage °This stage starts when you begin to contract regularly and your cervix begins to efface and dilate. It ends when your cervix is completely open (fully dilated). The first stage is the longest stage of labor and can last from 3 hours to 15 hours.  °Several methods are available to help with labor pain. You and your health care provider will decide which option is best for you. Options include:  °· Opioid medicines. These are strong pain medicines that you can get through your IV tube or as a shot into your muscle. These medicines lessen pain but do not make it go away completely.  °· Epidural. A medicine is given through a thin tube that   is inserted in your back. The medicine numbs the lower part of your body and prevents any pain in that area. °· Paracervical pain medicine. This is an injection of an anesthetic on each side of your cervix.   °· You may request natural childbirth, which does not involve the use of pain medicines or an epidural during labor and delivery. Instead, you will use other things, such as breathing exercises, to help cope with the pain. °Second Stage °The second stage of labor  begins when your cervix is fully dilated at 10 cm. It continues until you push your baby down through the birth canal and the baby is born. This stage can take only minutes or several hours. °· The location of your baby's head as it moves through the birth canal is reported as a number called a station. If the baby's head has not started its descent, the station is described as being at minus 3 (-3). When your baby's head is at the zero station, it is at the middle of the birth canal and is engaged in the pelvis. The station of your baby helps indicate the progress of the second stage of labor. °· When your baby is born, your health care provider may hold the baby with his or her head lowered to prevent amniotic fluid, mucus, and blood from getting into the baby's lungs. The baby's mouth and nose may be suctioned with a small bulb syringe to remove any additional fluid. °· Your health care provider may then place the baby on your stomach. It is important to keep the baby from getting cold. To do this, the health care provider will dry the baby off, place the baby directly on your skin (with no blankets between you and the baby), and cover the baby with warm, dry blankets.   °· The umbilical cord is cut. °Third Stage °During the third stage of labor, your health care provider will deliver the placenta (afterbirth) and make sure your bleeding is under control. The delivery of the placenta usually takes about 5 minutes but can take up to 30 minutes. After the placenta is delivered, a medicine may be given either by IV or injection to help contract the uterus and control bleeding. If you are planning to breastfeed, you can try to do so now. °After you deliver the placenta, your uterus should contract and get very firm. If your uterus does not remain firm, your health care provider will massage it. This is important because the contraction of the uterus helps cut off bleeding at the site where the placenta was attached  to your uterus. If your uterus does not contract properly and stay firm, you may continue to bleed heavily. If there is a lot of bleeding, medicines may be given to contract the uterus and stop the bleeding.  °  °This information is not intended to replace advice given to you by your health care provider. Make sure you discuss any questions you have with your health care provider. °  °Document Released: 02/24/2008 Document Revised: 06/07/2014 Document Reviewed: 01/12/2012 °Elsevier Interactive Patient Education ©2016 Elsevier Inc[employer] ° °

## 2016-01-20 NOTE — Progress Notes (Signed)
Pt c/o vaginal pain and pressure.

## 2016-01-25 ENCOUNTER — Encounter (HOSPITAL_COMMUNITY)
Admission: AD | Disposition: A | Discharge status: Home / Self Care | Payer: Self-pay | Source: Ambulatory Visit | Attending: Obstetrics and Gynecology

## 2016-01-25 ENCOUNTER — Inpatient Hospital Stay (HOSPITAL_COMMUNITY)
Admission: AD | Admit: 2016-01-25 | Discharge: 2016-01-28 | DRG: 765 | Disposition: A | Discharge status: Home / Self Care | Payer: Medicaid Other | Source: Ambulatory Visit | Attending: Obstetrics and Gynecology | Admitting: Obstetrics and Gynecology

## 2016-01-25 ENCOUNTER — Inpatient Hospital Stay (HOSPITAL_COMMUNITY): Payer: Medicaid Other | Admitting: Anesthesiology

## 2016-01-25 ENCOUNTER — Encounter (HOSPITAL_COMMUNITY): Payer: Self-pay

## 2016-01-25 DIAGNOSIS — Z3493 Encounter for supervision of normal pregnancy, unspecified, third trimester: Secondary | ICD-10-CM

## 2016-01-25 DIAGNOSIS — Z3483 Encounter for supervision of other normal pregnancy, third trimester: Secondary | ICD-10-CM | POA: Diagnosis present

## 2016-01-25 DIAGNOSIS — O99824 Streptococcus B carrier state complicating childbirth: Secondary | ICD-10-CM | POA: Diagnosis present

## 2016-01-25 DIAGNOSIS — Z3A39 39 weeks gestation of pregnancy: Secondary | ICD-10-CM

## 2016-01-25 DIAGNOSIS — O99214 Obesity complicating childbirth: Secondary | ICD-10-CM | POA: Diagnosis present

## 2016-01-25 DIAGNOSIS — Z6841 Body Mass Index (BMI) 40.0 and over, adult: Secondary | ICD-10-CM

## 2016-01-25 DIAGNOSIS — Z833 Family history of diabetes mellitus: Secondary | ICD-10-CM | POA: Diagnosis not present

## 2016-01-25 DIAGNOSIS — IMO0001 Reserved for inherently not codable concepts without codable children: Secondary | ICD-10-CM

## 2016-01-25 DIAGNOSIS — O99820 Streptococcus B carrier state complicating pregnancy: Secondary | ICD-10-CM

## 2016-01-25 LAB — TYPE AND SCREEN
ABO/RH(D): B POS
ANTIBODY SCREEN: NEGATIVE

## 2016-01-25 LAB — CBC
HCT: 34.7 % — ABNORMAL LOW (ref 36.0–46.0)
Hemoglobin: 11.5 g/dL — ABNORMAL LOW (ref 12.0–15.0)
MCH: 26.3 pg (ref 26.0–34.0)
MCHC: 33.1 g/dL (ref 30.0–36.0)
MCV: 79.4 fL (ref 78.0–100.0)
PLATELETS: 301 10*3/uL (ref 150–400)
RBC: 4.37 MIL/uL (ref 3.87–5.11)
RDW: 15.1 % (ref 11.5–15.5)
WBC: 7.1 10*3/uL (ref 4.0–10.5)

## 2016-01-25 SURGERY — Surgical Case
Anesthesia: Epidural | Site: Abdomen

## 2016-01-25 MED ORDER — BUPIVACAINE HCL (PF) 0.25 % IJ SOLN
INTRAMUSCULAR | Status: DC | PRN
  Administered 2016-01-25: 1.6 mL via EPIDURAL

## 2016-01-25 MED ORDER — SIMETHICONE 80 MG PO CHEW
80.0000 mg | CHEWABLE_TABLET | ORAL | Status: DC
  Administered 2016-01-26 – 2016-01-27 (×3): 80 mg via ORAL
  Filled 2016-01-25 (×3): qty 1

## 2016-01-25 MED ORDER — OXYCODONE-ACETAMINOPHEN 5-325 MG PO TABS: 1.0000 | ORAL_TABLET | ORAL | Status: DC | PRN

## 2016-01-25 MED ORDER — ONDANSETRON HCL 4 MG/2ML IJ SOLN: 4.0000 mg | Freq: Four times a day (QID) | INTRAMUSCULAR | Status: DC | PRN

## 2016-01-25 MED ORDER — MEPERIDINE HCL 25 MG/ML IJ SOLN: 6.2500 mg | INTRAMUSCULAR | Status: DC | PRN

## 2016-01-25 MED ORDER — COCONUT OIL OIL
1.0000 "application " | TOPICAL_OIL | Status: DC | PRN
  Administered 2016-01-26: 1 via TOPICAL
  Filled 2016-01-25: qty 120

## 2016-01-25 MED ORDER — SIMETHICONE 80 MG PO CHEW
80.0000 mg | CHEWABLE_TABLET | Freq: Three times a day (TID) | ORAL | Status: DC
  Administered 2016-01-25 – 2016-01-28 (×9): 80 mg via ORAL
  Filled 2016-01-25 (×9): qty 1

## 2016-01-25 MED ORDER — ONDANSETRON HCL 4 MG/2ML IJ SOLN
INTRAMUSCULAR | Status: AC
  Filled 2016-01-25: qty 2

## 2016-01-25 MED ORDER — NALBUPHINE HCL 10 MG/ML IJ SOLN: 5.0000 mg | INTRAMUSCULAR | Status: DC | PRN

## 2016-01-25 MED ORDER — LACTATED RINGERS IV SOLN
INTRAVENOUS | Status: DC | PRN
  Administered 2016-01-25: 40 [IU] via INTRAVENOUS

## 2016-01-25 MED ORDER — TETANUS-DIPHTH-ACELL PERTUSSIS 5-2.5-18.5 LF-MCG/0.5 IM SUSP: 0.5000 mL | Freq: Once | INTRAMUSCULAR | Status: DC

## 2016-01-25 MED ORDER — NALBUPHINE HCL 10 MG/ML IJ SOLN: 5.0000 mg | Freq: Once | INTRAMUSCULAR | Status: DC | PRN

## 2016-01-25 MED ORDER — CEFAZOLIN SODIUM-DEXTROSE 2-4 GM/100ML-% IV SOLN: 2.0000 g | INTRAVENOUS | Status: DC

## 2016-01-25 MED ORDER — LIDOCAINE HCL (PF) 1 % IJ SOLN
30.0000 mL | INTRAMUSCULAR | Status: DC | PRN
  Filled 2016-01-25: qty 30

## 2016-01-25 MED ORDER — FENTANYL 2.5 MCG/ML BUPIVACAINE 1/10 % EPIDURAL INFUSION (WH - ANES)
14.0000 mL/h | INTRAMUSCULAR | Status: DC | PRN
  Filled 2016-01-25: qty 125

## 2016-01-25 MED ORDER — PHENYLEPHRINE 40 MCG/ML (10ML) SYRINGE FOR IV PUSH (FOR BLOOD PRESSURE SUPPORT)
PREFILLED_SYRINGE | INTRAVENOUS | Status: AC
  Filled 2016-01-25: qty 10

## 2016-01-25 MED ORDER — PHENYLEPHRINE HCL 10 MG/ML IJ SOLN
INTRAMUSCULAR | Status: DC | PRN
  Administered 2016-01-25 (×2): 80 mg via INTRAVENOUS
  Administered 2016-01-25: 40 mg via INTRAVENOUS

## 2016-01-25 MED ORDER — SENNOSIDES-DOCUSATE SODIUM 8.6-50 MG PO TABS
2.0000 | ORAL_TABLET | ORAL | Status: DC
  Administered 2016-01-26 – 2016-01-27 (×3): 2 via ORAL
  Filled 2016-01-25 (×3): qty 2

## 2016-01-25 MED ORDER — LACTATED RINGERS IV SOLN
INTRAVENOUS | Status: DC | PRN
  Administered 2016-01-25 (×3): via INTRAVENOUS

## 2016-01-25 MED ORDER — DIPHENHYDRAMINE HCL 25 MG PO CAPS: 25.0000 mg | ORAL_CAPSULE | ORAL | Status: DC | PRN

## 2016-01-25 MED ORDER — LACTATED RINGERS IV SOLN
500.0000 mL | Freq: Once | INTRAVENOUS | Status: DC
Start: 2016-01-25 — End: 2016-01-25

## 2016-01-25 MED ORDER — SOD CITRATE-CITRIC ACID 500-334 MG/5ML PO SOLN
ORAL | Status: AC
  Administered 2016-01-25: 30 mL via ORAL
  Filled 2016-01-25: qty 15

## 2016-01-25 MED ORDER — CEFAZOLIN SODIUM-DEXTROSE 2-3 GM-% IV SOLR
INTRAVENOUS | Status: DC | PRN
  Administered 2016-01-25: 2 g via INTRAVENOUS

## 2016-01-25 MED ORDER — KETOROLAC TROMETHAMINE 30 MG/ML IJ SOLN
30.0000 mg | Freq: Four times a day (QID) | INTRAMUSCULAR | Status: AC | PRN
  Administered 2016-01-25: 30 mg via INTRAMUSCULAR

## 2016-01-25 MED ORDER — ONDANSETRON HCL 4 MG/2ML IJ SOLN
INTRAMUSCULAR | Status: DC | PRN
  Administered 2016-01-25: 4 mg via INTRAVENOUS

## 2016-01-25 MED ORDER — DIBUCAINE 1 % RE OINT: 1.0000 "application " | TOPICAL_OINTMENT | RECTAL | Status: DC | PRN

## 2016-01-25 MED ORDER — OXYTOCIN BOLUS FROM INFUSION: 500.0000 mL | Freq: Once | INTRAVENOUS | Status: DC

## 2016-01-25 MED ORDER — ONDANSETRON HCL 4 MG/2ML IJ SOLN: 4.0000 mg | Freq: Three times a day (TID) | INTRAMUSCULAR | Status: DC | PRN

## 2016-01-25 MED ORDER — OXYCODONE HCL 5 MG PO TABS: 10.0000 mg | ORAL_TABLET | ORAL | Status: DC | PRN

## 2016-01-25 MED ORDER — OXYCODONE HCL 5 MG PO TABS
5.0000 mg | ORAL_TABLET | ORAL | Status: DC | PRN
  Administered 2016-01-26 – 2016-01-28 (×12): 5 mg via ORAL
  Filled 2016-01-25 (×13): qty 1

## 2016-01-25 MED ORDER — OXYTOCIN 40 UNITS IN LACTATED RINGERS INFUSION - SIMPLE MED: 2.5000 [IU]/h | INTRAVENOUS | Status: DC

## 2016-01-25 MED ORDER — SODIUM CHLORIDE 0.9 % IV SOLN
1.0000 g | INTRAVENOUS | Status: DC
  Filled 2016-01-25 (×3): qty 1000

## 2016-01-25 MED ORDER — EPHEDRINE 5 MG/ML INJ: 10.0000 mg | INTRAVENOUS | Status: DC | PRN

## 2016-01-25 MED ORDER — SODIUM CHLORIDE 0.9 % IV SOLN
2.0000 g | Freq: Once | INTRAVENOUS | Status: AC
  Administered 2016-01-25: 2 g via INTRAVENOUS
  Filled 2016-01-25: qty 2000

## 2016-01-25 MED ORDER — MORPHINE SULFATE-NACL 0.5-0.9 MG/ML-% IV SOSY
PREFILLED_SYRINGE | INTRAVENOUS | Status: AC
  Filled 2016-01-25: qty 1

## 2016-01-25 MED ORDER — SCOPOLAMINE 1 MG/3DAYS TD PT72
1.0000 | MEDICATED_PATCH | Freq: Once | TRANSDERMAL | Status: DC
  Filled 2016-01-25: qty 1

## 2016-01-25 MED ORDER — LACTATED RINGERS IV SOLN: 500.0000 mL | INTRAVENOUS | Status: DC | PRN

## 2016-01-25 MED ORDER — LIDOCAINE HCL (PF) 1 % IJ SOLN: 30.0000 mL | INTRAMUSCULAR | Status: DC | PRN

## 2016-01-25 MED ORDER — DIPHENHYDRAMINE HCL 50 MG/ML IJ SOLN: 12.5000 mg | INTRAMUSCULAR | Status: DC | PRN

## 2016-01-25 MED ORDER — PHENYLEPHRINE 40 MCG/ML (10ML) SYRINGE FOR IV PUSH (FOR BLOOD PRESSURE SUPPORT): 80.0000 ug | PREFILLED_SYRINGE | INTRAVENOUS | Status: DC | PRN

## 2016-01-25 MED ORDER — DIPHENHYDRAMINE HCL 25 MG PO CAPS: 25.0000 mg | ORAL_CAPSULE | Freq: Four times a day (QID) | ORAL | Status: DC | PRN

## 2016-01-25 MED ORDER — OXYTOCIN 10 UNIT/ML IJ SOLN
INTRAMUSCULAR | Status: AC
  Filled 2016-01-25: qty 4

## 2016-01-25 MED ORDER — SODIUM CHLORIDE 0.9 % IR SOLN
Status: DC | PRN
  Administered 2016-01-25: 1000 mL

## 2016-01-25 MED ORDER — ACETAMINOPHEN 325 MG PO TABS
650.0000 mg | ORAL_TABLET | ORAL | Status: DC | PRN
  Administered 2016-01-25: 650 mg via ORAL

## 2016-01-25 MED ORDER — NALOXONE HCL 2 MG/2ML IJ SOSY
1.0000 ug/kg/h | PREFILLED_SYRINGE | INTRAVENOUS | Status: DC | PRN
  Filled 2016-01-25: qty 2

## 2016-01-25 MED ORDER — MENTHOL 3 MG MT LOZG: 1.0000 | LOZENGE | OROMUCOSAL | Status: DC | PRN

## 2016-01-25 MED ORDER — FENTANYL CITRATE (PF) 100 MCG/2ML IJ SOLN
INTRAMUSCULAR | Status: AC
  Filled 2016-01-25: qty 2

## 2016-01-25 MED ORDER — SODIUM CHLORIDE 0.9% FLUSH: 3.0000 mL | INTRAVENOUS | Status: DC | PRN

## 2016-01-25 MED ORDER — ACETAMINOPHEN 325 MG PO TABS: 650.0000 mg | ORAL_TABLET | ORAL | Status: DC | PRN

## 2016-01-25 MED ORDER — LACTATED RINGERS IV SOLN: INTRAVENOUS | Status: DC

## 2016-01-25 MED ORDER — NALOXONE HCL 0.4 MG/ML IJ SOLN: 0.4000 mg | INTRAMUSCULAR | Status: DC | PRN

## 2016-01-25 MED ORDER — PRENATAL MULTIVITAMIN CH
1.0000 | ORAL_TABLET | Freq: Every day | ORAL | Status: DC
  Administered 2016-01-26 – 2016-01-28 (×3): 1 via ORAL
  Filled 2016-01-25 (×3): qty 1

## 2016-01-25 MED ORDER — CEFAZOLIN SODIUM-DEXTROSE 2-4 GM/100ML-% IV SOLN
INTRAVENOUS | Status: AC
  Filled 2016-01-25: qty 100

## 2016-01-25 MED ORDER — LACTATED RINGERS IV SOLN
INTRAVENOUS | Status: DC | PRN
  Administered 2016-01-25: 14:00:00 via INTRAVENOUS

## 2016-01-25 MED ORDER — LACTATED RINGERS IV SOLN
INTRAVENOUS | Status: DC
  Administered 2016-01-26: 06:00:00 via INTRAVENOUS

## 2016-01-25 MED ORDER — IBUPROFEN 600 MG PO TABS
600.0000 mg | ORAL_TABLET | Freq: Four times a day (QID) | ORAL | Status: DC
  Administered 2016-01-26 – 2016-01-28 (×11): 600 mg via ORAL
  Filled 2016-01-25 (×11): qty 1

## 2016-01-25 MED ORDER — WITCH HAZEL-GLYCERIN EX PADS: 1.0000 "application " | MEDICATED_PAD | CUTANEOUS | Status: DC | PRN

## 2016-01-25 MED ORDER — SOD CITRATE-CITRIC ACID 500-334 MG/5ML PO SOLN
30.0000 mL | ORAL | Status: DC | PRN
  Administered 2016-01-25: 30 mL via ORAL

## 2016-01-25 MED ORDER — LACTATED RINGERS IV SOLN
INTRAVENOUS | Status: DC
  Administered 2016-01-25: 12:00:00 via INTRAUTERINE

## 2016-01-25 MED ORDER — DEXAMETHASONE SODIUM PHOSPHATE 4 MG/ML IJ SOLN
INTRAMUSCULAR | Status: AC
  Filled 2016-01-25: qty 1

## 2016-01-25 MED ORDER — PHENYLEPHRINE 8 MG IN D5W 100 ML (0.08MG/ML) PREMIX OPTIME
INJECTION | INTRAVENOUS | Status: AC
  Filled 2016-01-25: qty 100

## 2016-01-25 MED ORDER — FLEET ENEMA 7-19 GM/118ML RE ENEM: 1.0000 | ENEMA | Freq: Every day | RECTAL | Status: DC | PRN

## 2016-01-25 MED ORDER — OXYCODONE-ACETAMINOPHEN 5-325 MG PO TABS: 2.0000 | ORAL_TABLET | ORAL | Status: DC | PRN

## 2016-01-25 MED ORDER — OXYTOCIN 40 UNITS IN LACTATED RINGERS INFUSION - SIMPLE MED: 2.5000 [IU]/h | INTRAVENOUS | Status: AC

## 2016-01-25 MED ORDER — PHENYLEPHRINE 8 MG IN D5W 100 ML (0.08MG/ML) PREMIX OPTIME
INJECTION | INTRAVENOUS | Status: DC | PRN
  Administered 2016-01-25: 30 ug/min via INTRAVENOUS

## 2016-01-25 MED ORDER — DEXAMETHASONE SODIUM PHOSPHATE 4 MG/ML IJ SOLN
INTRAMUSCULAR | Status: DC | PRN
  Administered 2016-01-25: 4 mg via INTRAVENOUS

## 2016-01-25 MED ORDER — MORPHINE SULFATE (PF) 0.5 MG/ML IJ SOLN
INTRAMUSCULAR | Status: DC | PRN
  Administered 2016-01-25: .1 mg via EPIDURAL

## 2016-01-25 MED ORDER — SIMETHICONE 80 MG PO CHEW: 80.0000 mg | CHEWABLE_TABLET | ORAL | Status: DC | PRN

## 2016-01-25 MED ORDER — FENTANYL CITRATE (PF) 100 MCG/2ML IJ SOLN
INTRAMUSCULAR | Status: DC | PRN
  Administered 2016-01-25: 15 ug via INTRAVENOUS

## 2016-01-25 MED ORDER — SOD CITRATE-CITRIC ACID 500-334 MG/5ML PO SOLN: 30.0000 mL | ORAL | Status: DC | PRN

## 2016-01-25 MED ORDER — KETOROLAC TROMETHAMINE 30 MG/ML IJ SOLN: 30.0000 mg | Freq: Four times a day (QID) | INTRAMUSCULAR | Status: AC | PRN

## 2016-01-25 MED ORDER — KETOROLAC TROMETHAMINE 30 MG/ML IJ SOLN
INTRAMUSCULAR | Status: AC
  Filled 2016-01-25: qty 1

## 2016-01-25 MED ORDER — FLEET ENEMA 7-19 GM/118ML RE ENEM: 1.0000 | ENEMA | RECTAL | Status: DC | PRN

## 2016-01-25 SURGICAL SUPPLY — 25 items
CHLORAPREP W/TINT 26ML (MISCELLANEOUS) ×2 IMPLANT
CLAMP CORD UMBIL (MISCELLANEOUS) ×2 IMPLANT
DRSG OPSITE POSTOP 4X10 (GAUZE/BANDAGES/DRESSINGS) ×2 IMPLANT
ELECT REM PT RETURN 9FT ADLT (ELECTROSURGICAL) ×2
ELECTRODE REM PT RTRN 9FT ADLT (ELECTROSURGICAL) ×1 IMPLANT
GLOVE BIOGEL PI IND STRL 6.5 (GLOVE) ×1 IMPLANT
GLOVE BIOGEL PI IND STRL 7.0 (GLOVE) ×1 IMPLANT
GLOVE BIOGEL PI INDICATOR 6.5 (GLOVE) ×1
GLOVE BIOGEL PI INDICATOR 7.0 (GLOVE) ×1
GLOVE SURG SS PI 6.0 STRL IVOR (GLOVE) ×2 IMPLANT
GOWN STRL REUS W/TWL LRG LVL3 (GOWN DISPOSABLE) ×4 IMPLANT
KIT ABG SYR 3ML LUER SLIP (SYRINGE) ×2 IMPLANT
NEEDLE HYPO 25X5/8 SAFETYGLIDE (NEEDLE) ×2 IMPLANT
NS IRRIG 1000ML POUR BTL (IV SOLUTION) ×2 IMPLANT
PACK C SECTION WH (CUSTOM PROCEDURE TRAY) ×2 IMPLANT
PAD ABD 8X10 STRL (GAUZE/BANDAGES/DRESSINGS) ×2 IMPLANT
PAD OB MATERNITY 4.3X12.25 (PERSONAL CARE ITEMS) ×2 IMPLANT
PENCIL SMOKE EVAC W/HOLSTER (ELECTROSURGICAL) ×2 IMPLANT
RTRCTR C-SECT PINK 25CM LRG (MISCELLANEOUS) ×2 IMPLANT
SPONGE GAUZE 4X4 12PLY STER LF (GAUZE/BANDAGES/DRESSINGS) ×4 IMPLANT
SUT VIC AB 0 CT1 36 (SUTURE) ×8 IMPLANT
SUT VIC AB 4-0 KS 27 (SUTURE) ×2 IMPLANT
TAPE CLOTH SURG 4X10 WHT LF (GAUZE/BANDAGES/DRESSINGS) ×2 IMPLANT
TOWEL OR 17X24 6PK STRL BLUE (TOWEL DISPOSABLE) ×2 IMPLANT
TRAY FOLEY CATH SILVER 14FR (SET/KITS/TRAYS/PACK) ×2 IMPLANT

## 2016-01-25 NOTE — Anesthesia Postprocedure Evaluation (Signed)
Anesthesia Post Note  Patient: Victoria Rodriguez  Procedure(s) Performed: Procedure(s) (LRB): CESAREAN SECTION (N/A)  Patient location during evaluation: PACU Anesthesia Type: Spinal Level of consciousness: oriented and awake and alert Pain management: pain level controlled Vital Signs Assessment: post-procedure vital signs reviewed and stable Respiratory status: spontaneous breathing, respiratory function stable and nonlabored ventilation Cardiovascular status: blood pressure returned to baseline and stable Postop Assessment: no headache and no backache Anesthetic complications: no     Last Vitals:  Vitals:   01/25/16 1530 01/25/16 1545  BP: 120/86 130/77  Pulse: 70 72  Resp: 19 15  Temp: 36.6 C     Last Pain:  Vitals:   01/25/16 1530  TempSrc: Oral  PainSc: 0-No pain   Pain Goal:                 Nilda Simmer

## 2016-01-25 NOTE — Anesthesia Preprocedure Evaluation (Signed)
Anesthesia Evaluation  Patient identified by MRN, date of birth, ID band Patient awake    Reviewed: Allergy & Precautions, NPO status , Patient's Chart, lab work & pertinent test results  History of Anesthesia Complications Negative for: history of anesthetic complications  Airway Mallampati: II  TM Distance: >3 FB Neck ROM: Full    Dental  (+) Teeth Intact   Pulmonary neg pulmonary ROS,    Pulmonary exam normal breath sounds clear to auscultation       Cardiovascular negative cardio ROS   Rhythm:Regular Rate:Normal     Neuro/Psych negative neurological ROS     GI/Hepatic negative GI ROS, Neg liver ROS,   Endo/Other  neg diabetesMorbid obesity  Renal/GU negative Renal ROS     Musculoskeletal   Abdominal (+) + obese,   Peds  Hematology negative hematology ROS (+)   Anesthesia Other Findings   Reproductive/Obstetrics (+) Pregnancy                             Anesthesia Physical Anesthesia Plan  ASA: II  Anesthesia Plan: Epidural   Post-op Pain Management:    Induction:   Airway Management Planned:   Additional Equipment:   Intra-op Plan:   Post-operative Plan:   Informed Consent: I have reviewed the patients History and Physical, chart, labs and discussed the procedure including the risks, benefits and alternatives for the proposed anesthesia with the patient or authorized representative who has indicated his/her understanding and acceptance.     Plan Discussed with:   Anesthesia Plan Comments: (I have discussed risks of neuraxial anesthesia including but not limited to infection, bleeding, nerve injury, back pain, headache, seizures, and failure of block. Patient denies bleeding disorders and is not currently anticoagulated. Labs have been reviewed. Risks and benefits discussed. All patient's questions answered.   Hgb 11.5 Hct 34.7 Platelets 301)         Anesthesia Quick Evaluation

## 2016-01-25 NOTE — MAU Note (Signed)
Patient presents with clear vaginal discharge unsure if water broken, contractions painful this morning.

## 2016-01-25 NOTE — H&P (Signed)
Victoria Rodriguez is a 35 y.o. female [phone] @ 39.4 wks  presenting for reg ctx's since early am. Denies ROM, vag bleeding. GBS pos. OB History    Gravida Para Term Preterm AB Living   5 1 1  0 3 1   SAB TAB Ectopic Multiple Live Births   1 2            Obstetric Comments   2000: 6lbs 9oz TSVD     Past Medical History:  Diagnosis Date  . Abscess of breast 05/14/2015  . Chlamydia contact, treated   . Hidradenitis suppurativa    surgical  . Microscopic hematuria 03/21/2015   03/20/15 urine micro with 5-10 RBC. Recommend recheck UA & micro at next office visit.    Past Surgical History:  Procedure Laterality Date  . AXILLARY HIDRADENITIS EXCISION  2011   bilateral  . DILATION AND CURETTAGE OF UTERUS    . HYDRADENITIS EXCISION Left 02/12/2014   Procedure: EXCISION HIDRADENITIS AXILLA;  Surgeon: Coralie Keens, MD;  Location: [facility];  Service: General;  Laterality: Left;  . IRRIGATION AND DEBRIDEMENT ABSCESS Left 11/16/2013   Procedure: IRRIGATION AND DEBRIDEMENT LEFT AXILLARY ABSCESS;  Surgeon: Pedro Earls, MD;  Location: [facility];  Service: General;  Laterality: Left;   Family History: family history includes Asthma in her brother, mother, and sister; Diabetes in her father. Social History:  reports that she has never smoked. She has never used smokeless tobacco. She reports that she does not drink alcohol or use drugs.     Maternal Diabetes: No Genetic Screening: Normal Maternal Ultrasounds/Referrals: Normal Fetal Ultrasounds or other Referrals:  None Maternal Substance Abuse:  No Significant Maternal Medications:  None Significant Maternal Lab Results:  None Other Comments:  None  Review of Systems  Constitutional: Negative.   HENT: Negative.   Eyes: Negative.   Respiratory: Negative.   Cardiovascular: Negative.   Gastrointestinal: Positive for abdominal pain.  Genitourinary: Negative.   Musculoskeletal: Negative.   Skin: Negative.    Neurological: Negative.   Endo/Heme/Allergies: Negative.   Psychiatric/Behavioral: Negative.    Maternal Medical History:  Reason for admission: Contractions.   Contractions: Onset was 6-12 hours ago.   Frequency: regular.   Perceived severity is moderate.    Fetal activity: Perceived fetal activity is normal.   Last perceived fetal movement was within the past hour.    Prenatal complications: no prenatal complications Prenatal Complications - Diabetes: none.    Dilation: 4.5 Effacement (%): 90 Exam by:: Mammie Russian RN Blood pressure 131/76, pulse 92, temperature 98.5 F (36.9 C), temperature source Oral, resp. rate 18, height 5\' 3"  (1.6 m), weight 237 lb (107.5 kg), last menstrual period 04/23/2015, unknown if currently breastfeeding. Maternal Exam:  Uterine Assessment: Contraction strength is moderate.  Contraction frequency is regular.   Abdomen: Patient reports no abdominal tenderness. Fetal presentation: vertex  Introitus: Normal vulva. Normal vagina.  Ferning test: not done.  Nitrazine test: not done. Amniotic fluid character: meconium stained.  Pelvis: adequate for delivery.   Cervix: Cervix evaluated by digital exam.     Fetal Exam Fetal Monitor Review: Mode: ultrasound.   Baseline rate: 180's.  Variability: moderate (6-25 bpm).   Pattern: no accelerations and variable decelerations.    Fetal State Assessment: Category II - tracings are indeterminate.     Physical Exam  Constitutional: She is oriented to person, place, and time. She appears well-developed and well-nourished.  HENT:  Head: Normocephalic.  Eyes: Pupils are equal, round, and  reactive to light.  Neck: Normal range of motion. Neck supple.  Cardiovascular: Normal rate, regular rhythm and normal heart sounds.   Respiratory: Effort normal and breath sounds normal.  GI: Soft. Bowel sounds are normal.  Genitourinary: Vagina normal and uterus normal.  Musculoskeletal: Normal range of motion.   Neurological: She is alert and oriented to person, place, and time. She has normal reflexes.  Skin: Skin is warm and dry.  Psychiatric: She has a normal mood and affect. Her behavior is normal. Judgment and thought content normal.    Prenatal labs: ABO, Rh: B/Positive/-- (01/12 0000) Antibody: Negative (01/12 0000) Rubella: Immune (01/12 0000) RPR: Nonreactive (01/12 0000)  HBsAg: Negative (01/12 0000)  HIV: Non-reactive (01/12 0000)  GBS: Positive (08/08 1106)   Assessment/Plan: Reg ctx's since this am, GBS pos, fetal tachycardia, admit to L&D. Dr. Elly Modena consulted and reviewed strip. To continue present POC. Admit for vag delivery.    Koren Shiver 01/25/2016, 12:11 PM

## 2016-01-25 NOTE — MAU Note (Signed)
Notified Daiva Nakayama CNM patient G5P1 term labor 4.5/90 BBOW, fhr with deep variables, GBS positive, CNM to come to evaluate patient.

## 2016-01-25 NOTE — Progress Notes (Signed)
Patient ID: Victoria Rodriguez, female   DOB: [birth year], 35 y.o.   MRN: [id number] Patient admitted 40 minutes ago with category 2 tracing. AROM revealed thick meconium. Extrauterine manipulation performed without improvement in fetal heart rate tracing. Discussed delivery via cesarean section. Risks, benefits and alternatives were explained including but not limited to risks of bleeding, infection and damage to adjacent organs. Patient verbalized understanding and all questions were answered.  FHT: baseline 180, min variability, no accels,  variable decels. Toco: ctx q1-2 minutes

## 2016-01-25 NOTE — Anesthesia Procedure Notes (Addendum)
Spinal  Patient location during procedure: OR Start time: 01/25/2016 1:05 PM End time: 01/25/2016 1:13 PM Staffing Anesthesiologist: Nilda Simmer Performed: anesthesiologist  Preanesthetic Checklist Completed: patient identified, surgical consent, pre-op evaluation, timeout performed, IV checked, risks and benefits discussed and monitors and equipment checked Spinal Block Patient position: sitting Prep: DuraPrep Patient monitoring: heart rate, continuous pulse ox and blood pressure Approach: midline Location: L3-4 Injection technique: catheter Needle Needle type: Tuohy and Pencan  Needle gauge: 25 G Needle length: 9 cm Needle insertion depth: 9 cm Catheter at skin depth: 13 cm Assessment Sensory level: T4 Additional Notes Attempted spinal in 2 locations with introducer and Pencan 24g spinal needle but continued to hub the spinal needle to the introducer without CSF return. Switched to CSE technique. With Tuohy needle hubbed and tinting the skin, LOR was achieved with air. Pencan 25g spinal needle passed through Tuohy with return of CSF. Spinal dose given followed by easy threading of epidural catheter through Tuohy needle into epidural space.

## 2016-01-25 NOTE — Progress Notes (Signed)
Dr Elly Modena at bedside discussing risks and benefits of primary cesarean section due to [facility]. Pt verbalizes and understands to proceed with cesarean section.

## 2016-01-25 NOTE — MAU Note (Signed)
Urine sent to lab 

## 2016-01-25 NOTE — Op Note (Signed)
Victoria Rodriguez PROCEDURE DATE: 01/25/2016  PREOPERATIVE DIAGNOSIS: Intrauterine pregnancy at  [redacted]w[redacted]d weeks gestation; non-reassuring fetal status  POSTOPERATIVE DIAGNOSIS: The same  PROCEDURE:     Cesarean Section  SURGEON:  Dr. Vickii Chafe Lorea Kupfer  ASSISTANT: none  INDICATIONS: Victoria Rodriguez is a 35 y.o. [id number] at [redacted]w[redacted]d scheduled for cesarean section secondary to non-reassuring fetal status.  The risks of cesarean section discussed with the patient included but were not limited to: bleeding which may require transfusion or reoperation; infection which may require antibiotics; injury to bowel, bladder, ureters or other surrounding organs; injury to the fetus; need for additional procedures including hysterectomy in the event of a life-threatening hemorrhage; placental abnormalities wth subsequent pregnancies, incisional problems, thromboembolic phenomenon and other postoperative/anesthesia complications. The patient concurred with the proposed plan, giving informed written consent for the procedure.    FINDINGS:  Viable female infant in cephalic presentation. Thick meconium stained fluid amniotic fluid.  Intact placenta, three vessel cord.  Normal uterus, fallopian tubes and ovaries bilaterally.  ANESTHESIA:    Spinal INTRAVENOUS FLUIDS:700 ml ESTIMATED BLOOD LOSS: 600 ml URINE OUTPUT:  450 ml SPECIMENS: Placenta sent to pathology COMPLICATIONS: None immediate  PROCEDURE IN DETAIL:  The patient received intravenous antibiotics and had sequential compression devices applied to her lower extremities while in the preoperative area.  She was then taken to the operating room where anesthesia was induced and was found to be adequate. A foley catheter was placed into her bladder and attached to Nysir Fergusson gravity. She was then placed in a dorsal supine position with a leftward tilt, and prepped and draped in a sterile manner. After an adequate timeout was performed, a Pfannenstiel skin incision was  made with scalpel and carried through to the underlying layer of fascia. The fascia was incised in the midline and this incision was extended bilaterally using the Mayo scissors. Kocher clamps were applied to the superior aspect of the fascial incision and the underlying rectus muscles were dissected off bluntly. A similar process was carried out on the inferior aspect of the facial incision. The rectus muscles were separated in the midline bluntly and the peritoneum was entered bluntly. The Alexis self-retaining retractor was introduced into the abdominal cavity. Attention was turned to the lower uterine segment where a transverse hysterotomy was made with a scalpel and extended bilaterally bluntly. The infant was successfully delivered, and cord was clamped and cut and infant was handed over to awaiting neonatology team. Uterine massage was then administered and the placenta delivered intact with three-vessel cord. The uterus was cleared of clot and debris.  The hysterotomy was closed with 0 Vicryl in a running locked fashion, and an imbricating layer was also placed with a 0 Vicryl. Overall, excellent hemostasis was noted. The pelvis copiously irrigated and cleared of all clot and debris. Hemostasis was confirmed on all surfaces.  The peritoneum and the muscles were reapproximated using 0 vicryl interrupted stitches. The fascia was then closed using 0 Vicryl in a running fashion.  The subcutaneous layer was reapproximated with plain gut and the skin was closed in a subcuticular fashion using 3.0 Vicryl. The patient tolerated the procedure well. Sponge, lap, instrument and needle counts were correct x 2. She was taken to the recovery room in stable condition.    Victoria Rodriguez,PEGGYMD  01/25/2016 2:11 PM

## 2016-01-25 NOTE — Transfer of Care (Signed)
Immediate Anesthesia Transfer of Care Note  Patient: Victoria Rodriguez  Procedure(s) Performed: Procedure(s): CESAREAN SECTION (N/A)  Patient Location: PACU  Anesthesia Type:Spinal  Level of Consciousness: awake  Airway & Oxygen Therapy: Patient Spontanous Breathing  Post-op Assessment: Report given to RN and Post -op Vital signs reviewed and stable  Post vital signs: Reviewed  Last Vitals:  Vitals:   01/25/16 1216 01/25/16 1423  BP: 109/63 (!) 126/45  Pulse: 93 80  Resp: 20 13  Temp: 36.9 C 36.5 C    Last Pain:  Vitals:   01/25/16 1423  TempSrc: Oral  PainSc:          Complications: No apparent anesthesia complications

## 2016-01-26 ENCOUNTER — Encounter (HOSPITAL_COMMUNITY): Payer: Self-pay | Admitting: Obstetrics and Gynecology

## 2016-01-26 LAB — CBC
HCT: 30.5 % — ABNORMAL LOW (ref 36.0–46.0)
HEMOGLOBIN: 10.1 g/dL — AB (ref 12.0–15.0)
MCH: 26.3 pg (ref 26.0–34.0)
MCHC: 33.1 g/dL (ref 30.0–36.0)
MCV: 79.4 fL (ref 78.0–100.0)
Platelets: 281 10*3/uL (ref 150–400)
RBC: 3.84 MIL/uL — AB (ref 3.87–5.11)
RDW: 15.1 % (ref 11.5–15.5)
WBC: 14.4 10*3/uL — AB (ref 4.0–10.5)

## 2016-01-26 LAB — RPR: RPR: NONREACTIVE

## 2016-01-26 NOTE — Progress Notes (Signed)
Subjective: Postpartum Day 1: Cesarean Delivery Patient reports incisional pain, tolerating PO and + flatus.    Objective: Vital signs in last 24 hours: Temp:  [97.6 F (36.4 C)-98.5 F (36.9 C)] 98.5 F (36.9 C) (08/28 0600) Pulse Rate:  [68-93] 71 (08/28 0600) Resp:  [15-24] 20 (08/28 0600) BP: (102-138)/(45-90) 135/77 (08/28 0600) SpO2:  [96 %-100 %] 100 % (08/28 0600) Weight:  [237 lb (107.5 kg)] 237 lb (107.5 kg) (08/27 1053)  Physical Exam:  General: alert, cooperative, appears stated age and no distress Lochia: appropriate Uterine Fundus: firm Incision: no significant drainage DVT Evaluation: No evidence of DVT seen on physical exam. Negative Homan's sign.   Recent Labs  01/25/16 1145 01/26/16 0510  HGB 11.5* 10.1*  HCT 34.7* 30.5*    Assessment/Plan: Status post Cesarean section. Doing well postoperatively.  Continue current care.  Koren Shiver 01/26/2016, 7:58 AM

## 2016-01-26 NOTE — Plan of Care (Signed)
Problem: Education: Goal: Knowledge of [facility] Education information/materials will improve Outcome: Completed/Met Date Met: 01/26/16 Discussed plan of care and explained pain control,Foley removal,and IV removal.  Problem: Pain Managment: Goal: General experience of comfort will improve Outcome: Completed/Met Date Met: 01/26/16 Good pain control on po Motrin.  Problem: Physical Regulation: Goal: Ability to maintain clinical measurements within normal limits will improve Outcome: Not Progressing Blood pressures remain elevated at times.  Problem: Tissue Perfusion: Goal: Risk factors for ineffective tissue perfusion will decrease Outcome: Progressing Patient has been up but has SCD's on in bed.  Problem: Activity: Goal: Risk for activity intolerance will decrease Outcome: Completed/Met Date Met: 01/26/16 Tolerated going to NICU well.Encouraged to walk in the hall several times today to increase her metabolism and prevent DVT.  Problem: Fluid Volume: Goal: Ability to maintain a balanced intake and output will improve Outcome: Completed/Met Date Met: 01/26/16 Good po intake,good urine output at this time.

## 2016-01-26 NOTE — Plan of Care (Signed)
Problem: Role Relationship: Goal: Ability to demonstrate positive interaction with newborn will improve Outcome: Not Progressing Infant sent to [facility] to NICU.  Problem: Pain Management: Goal: General experience of comfort will improve and pain level will decrease Outcome: Completed/Met Date Met: 01/26/16 Good pain control on po Motrin and Oxy IR.  Problem: Bowel/Gastric: Goal: Gastrointestinal status will improve Outcome: Progressing Tolerated a clear diet advanced to Regular diet.  Problem: Respiratory: Goal: Ability to maintain adequate ventilation will improve Outcome: Completed/Met Date Met: 01/26/16 Can I/S up to [phone]

## 2016-01-26 NOTE — Consult Note (Addendum)
Lactation consult with this mom of a term baby, transferred to another hospital due to [facility] and possible ECMO. Mom was sleeping when I saw her this morning, around 10 am. At that time, I showed mom how to hand express, and told her I would check back on her later. Mom did not have any expressable colostrum at this time.  I went back around 11 am, and mom was crying after finding out that the baby needed to go on ECMO. Mom's sister and another visitor were with mom, and I told her I would check back to have her pump later today.

## 2016-01-26 NOTE — Progress Notes (Signed)
Initial visit with Azumi to introduce spiritual care services and offer support upon the birth and transfer of her daughter to NICU at [facility] shared her birth story and her worries about her daughter.  She has a 36 year old son Oley Balm who started back to school today.  I offered a listening presence as she shared her feelings and also prayer at [facility] request.  She was appropriately tearful, but shared that she is hopeful that they have not had to do ECMO as initially suspected and she thinks the baby may be doing a little better.  She was appreciative of the visit and support.  Will continue to follow.   Please page as further needs arise.  Donald Prose. Elyn Peers, M.Div. [facility] Chaplain Pager [phone] Office [phone]     01/26/16 1704  Clinical Encounter Type  Visited With Patient  Visit Type Initial;Spiritual support  Referral From Nurse;Social work  Spiritual Encounters  Spiritual Needs Ritual;Emotional  Stress Factors  Patient Stress Factors Loss of control;Major life changes

## 2016-01-26 NOTE — Anesthesia Postprocedure Evaluation (Signed)
Anesthesia Post Note  Patient: Victoria Rodriguez  Procedure(s) Performed: Procedure(s) (LRB): CESAREAN SECTION (N/A)  Patient location during evaluation: [facility] Unit Anesthesia Type: Combined Spinal/Epidural Level of consciousness: awake and alert and oriented Pain management: pain level controlled Vital Signs Assessment: post-procedure vital signs reviewed and stable Respiratory status: spontaneous breathing and nonlabored ventilation Cardiovascular status: stable Postop Assessment: no headache, no backache, patient able to bend at knees, no signs of nausea or vomiting and adequate PO intake Anesthetic complications: no Comments: Baby transferred to [place]:  Vitals:   01/26/16 0600 01/26/16 0943  BP: 135/77 111/62  Pulse: 71 79  Resp: 20 20  Temp: 36.9 C 36.8 C    Last Pain:  Vitals:   01/26/16 1053  TempSrc:   PainSc: 7    Pain Goal: Patients Stated Pain Goal: 3 (01/26/16 1053)               [name]

## 2016-01-26 NOTE — Addendum Note (Signed)
Addendum  created 01/26/16 1236 by Brock Ra, CRNA   Sign clinical note

## 2016-01-27 ENCOUNTER — Encounter: Payer: Medicaid Other | Admitting: Obstetrics and Gynecology

## 2016-01-27 NOTE — Lactation Note (Signed)
Lactation Consultation Note  Patient Name: Victoria Rodriguez [id number] Date: 01/27/2016  Baby sent to another hospital, possibly for ECMO. Mom states that she has not felt like pumping because she has been upset and worried about her baby all day. However, mom states that she just received a call about her baby and it seems the baby had a good day. Discussed the need to pump if mom wants to provide breast milk for the baby. Mom states that she knows she needs to pump every 3 hours. Mom reports that she is comfortable with pumping and will start again soon.  Enc mom to call for assistance as needed.    Maternal Data    Feeding    LATCH Score/Interventions                      Lactation Tools Discussed/Used     Consult Status      Andres Labrum 01/27/2016, 5:52 PM

## 2016-01-27 NOTE — Progress Notes (Signed)
Attempted f/u with patient to offer support, but pt was sleeping.    Please page as needs arise.  [place], [place] Pager, [phone]

## 2016-01-27 NOTE — Progress Notes (Signed)
POSTPARTUM PROGRESS NOTE  Post Operative Day 2 Subjective:  Victoria Rodriguez is a 35 y.o. [id number] [redacted]w[redacted]d s/p PLTCS.  No acute events overnight.  Pt denies problems with ambulating, voiding or po intake.  She denies nausea or vomiting.  Pain is well controlled.  She has had flatus. She has had bowel movement.  Lochia Minimal.   Objective: Blood pressure 123/60, pulse 84, temperature 98.3 F (36.8 C), temperature source Oral, resp. rate 18, height 5\' 3"  (1.6 m), weight 237 lb (107.5 kg), last menstrual period 04/23/2015, SpO2 97 %, unknown if currently breastfeeding.  Physical Exam:  General: alert, cooperative and no distress Lochia:normal flow Chest: no respiratory distress Heart:regular rate, distal pulses intact Incision: c/d/i pressure dressing in place Abdomen: soft, nontender,  Uterine Fundus: firm, appropriately tender DVT Evaluation: No calf swelling or tenderness Extremities: trace edema   Recent Labs  01/25/16 1145 01/26/16 0510  HGB 11.5* 10.1*  HCT 34.7* 30.5*    Assessment/Plan:  ASSESSMENT: Victoria Rodriguez is a 35 y.o. [id number] [redacted]w[redacted]d s/p PLTCS for fetal indications.  Plan for discharge tomorrow   LOS: 2 days   Brayton Mars 01/27/2016, 10:37 AM

## 2016-01-28 LAB — RAPID HIV SCREEN (HIV 1/2 AB+AG)
HIV 1/2 ANTIBODIES: NONREACTIVE
HIV-1 P24 ANTIGEN - HIV24: NONREACTIVE

## 2016-01-28 MED ORDER — SENNOSIDES-DOCUSATE SODIUM 8.6-50 MG PO TABS: 1.0000 | ORAL_TABLET | Freq: Every evening | ORAL | 1 refills | Status: DC | PRN

## 2016-01-28 MED ORDER — DOCUSATE SODIUM 100 MG PO CAPS: 100.0000 mg | ORAL_CAPSULE | Freq: Two times a day (BID) | ORAL | 0 refills | Status: DC

## 2016-01-28 MED ORDER — SIMETHICONE 80 MG PO CHEW: 80.0000 mg | CHEWABLE_TABLET | Freq: Four times a day (QID) | ORAL | 1 refills | Status: DC | PRN

## 2016-01-28 MED ORDER — PRENATAL MULTIVITAMIN CH: 1.0000 | ORAL_TABLET | Freq: Every day | ORAL | 2 refills | Status: DC

## 2016-01-28 MED ORDER — OXYCODONE-ACETAMINOPHEN 5-325 MG PO TABS: 1.0000 | ORAL_TABLET | Freq: Four times a day (QID) | ORAL | 0 refills | Status: DC | PRN

## 2016-01-28 MED ORDER — IBUPROFEN 600 MG PO TABS: 600.0000 mg | ORAL_TABLET | Freq: Four times a day (QID) | ORAL | 1 refills | Status: DC | PRN

## 2016-01-28 NOTE — Lactation Note (Signed)
Mom given DEBP with review.

## 2016-01-28 NOTE — Lactation Note (Signed)
Lactation Consultation Note  Patient Name: Victoria Rodriguez [id number] Date: 01/28/2016   Baby transferred to another hospital. Mom reports that she has started pumping and would like to have a [facility] loaner to take home today. Mom given paperwork and she has LC's number to call when ready for the pump.  Mom used DEBP and able to pump about 3 ml of EBM. Assisted mom with hand expression. Discussed EBM storage guidelines and how to transport EBM to NICU. Mom has NICU booklet for reference as well. Enc mom to continue pumping every 2-3 hours for 15 minutes--8 times/24 hours--followed by hand expression. Mom states that she is feeling better about the baby today and about providing EBM.  Mom aware of OP/BFSG and [place] phone line assistance after D/C.   Maternal Data    Feeding    LATCH Score/Interventions                      Lactation Tools Discussed/Used     Consult Status      Andres Labrum 01/28/2016, 11:50 AM

## 2016-01-28 NOTE — Progress Notes (Signed)
Pt verbalizes understanding of d/c instructions, medications, follow up appts, when to seek medical attention, and belongings policy. Pt has no IV at time of d/c. Pt has no questions at this time. Pt was given a copy of d/c instructions and Baby and Me book for her reference post d/c. Pts family is at the bedside and will be driving her home. Victoria Rodriguez

## 2016-01-28 NOTE — Progress Notes (Signed)
Daily Post Partum Note  KELIN BOZEK is a 35 y.o. [id number]  POD#3 s/p pLTCS for NRFHT @ [redacted]w[redacted]d.  Pregnancy c/b BMI 40s, AMA 24hr/overnight events:  None  Subjective:  +flatus and BMs, minimal lochia. No chest pain, SOB or issues with ambulating, voiding, eating, drinking and pain is controlled  Objective:     Current Vital Signs 24h Vital Sign Ranges  T 98.6 F (37 C) Temp  Avg: 99 F (37.2 C)  Min: 98.6 F (37 C)  Max: 99.4 F (37.4 C)  BP 114/66 BP  Min: 114/66  Max: 138/79  HR (!) 101 (nurse notified Sharee Pimple)) Pulse  Avg: 99  Min: 95  Max: 105  RR 16 Resp  Avg: 17.5  Min: 16  Max: 18  SaO2 99 % Not Delivered SpO2  Avg: 99.5 %  Min: 99 %  Max: 100 %       24 Hour I/O Current Shift I/O  Time Ins Outs No intake/output data recorded. No intake/output data recorded.    General: NAD Abdomen: +BS, mildly distended and tympanic, nttp. C/d/i incision  Perineum: deferred Skin:  Warm and dry.  Cardiovascular: S1, S2 normal, no murmur, rub or gallop, regular rate and rhythm Respiratory:  Clear to auscultation bilateral. Normal respiratory effort Extremities: no c/c/e  Medications Current Facility-Administered Medications  Medication Dose Route Frequency Provider Last Rate Last Dose  . acetaminophen (TYLENOL) tablet 650 mg  650 mg Oral Q4H PRN Mora Bellman, MD   650 mg at 01/25/16 2239  . coconut oil  1 application Topical PRN Mora Bellman, MD   1 application at [phone]  . witch hazel-glycerin (TUCKS) pad 1 application  1 application Topical PRN Peggy Constant, MD       And  . dibucaine (NUPERCAINAL) 1 % rectal ointment 1 application  1 application Rectal PRN Peggy Constant, MD      . diphenhydrAMINE (BENADRYL) injection 12.5 mg  12.5 mg Intravenous Q4H PRN Nilda Simmer, MD       Or  . diphenhydrAMINE (BENADRYL) capsule 25 mg  25 mg Oral Q4H PRN Nilda Simmer, MD      . diphenhydrAMINE (BENADRYL) capsule 25 mg  25 mg Oral Q6H PRN Peggy  Constant, MD      . ibuprofen (ADVIL,MOTRIN) tablet 600 mg  600 mg Oral Q6H Peggy Constant, MD   600 mg at 01/28/16 0521  . lactated ringers infusion   Intravenous Continuous Mora Bellman, MD   Stopped at 01/26/16 1250  . menthol-cetylpyridinium (CEPACOL) lozenge 3 mg  1 lozenge Oral Q2H PRN Peggy Constant, MD      . nalbuphine (NUBAIN) injection 5 mg  5 mg Intravenous Q4H PRN Nilda Simmer, MD       Or  . nalbuphine (NUBAIN) injection 5 mg  5 mg Subcutaneous Q4H PRN Nilda Simmer, MD      . nalbuphine (NUBAIN) injection 5 mg  5 mg Intravenous Once PRN Nilda Simmer, MD       Or  . nalbuphine (NUBAIN) injection 5 mg  5 mg Subcutaneous Once PRN Nilda Simmer, MD      . naloxone [facility]) 2 mg in dextrose 5 % 250 mL infusion  1-4 mcg/kg/hr Intravenous Continuous PRN Nilda Simmer, MD      . naloxone [facility]) injection 0.4 mg  0.4 mg Intravenous PRN Nilda Simmer, MD       And  . sodium chloride flush (NS) 0.9 % injection  3 mL  3 mL Intravenous PRN Nilda Simmer, MD      . ondansetron [facility]) injection 4 mg  4 mg Intravenous Q8H PRN Nilda Simmer, MD      . oxyCODONE (Oxy IR/ROXICODONE) immediate release tablet 10 mg  10 mg Oral Q4H PRN Peggy Constant, MD      . oxyCODONE (Oxy IR/ROXICODONE) immediate release tablet 5 mg  5 mg Oral Q4H PRN Mora Bellman, MD   5 mg at 01/28/16 0834  . prenatal multivitamin tablet 1 tablet  1 tablet Oral Q1200 Mora Bellman, MD   1 tablet at 01/27/16 1244  . scopolamine (TRANSDERM-SCOP) 1 MG/3DAYS 1.5 mg  1 patch Transdermal Once Nilda Simmer, MD      . senna-docusate (Senokot-S) tablet 2 tablet  2 tablet Oral Q24H Mora Bellman, MD   2 tablet at 01/27/16 2310  . simethicone (MYLICON) chewable tablet 80 mg  80 mg Oral TID PC Peggy Constant, MD   80 mg at 01/28/16 0815  . simethicone (MYLICON) chewable tablet 80 mg  80 mg Oral Q24H Peggy Constant, MD   80 mg at  01/27/16 2310  . simethicone (MYLICON) chewable tablet 80 mg  80 mg Oral PRN Peggy Constant, MD      . Tdap (BOOSTRIX) injection 0.5 mL  0.5 mL Intramuscular Once Mora Bellman, MD        Labs:   Recent Labs Lab 01/25/16 1145 01/26/16 0510  WBC 7.1 14.4*  HGB 11.5* 10.1*  HCT 34.7* 30.5*  PLT 301 281   No results for input(s): NA, K, CL, CO2, BUN, CREATININE, LABGLOM, GLUCOSE, CALCIUM in the last 168 hours.  Assessment & Plan:  Pt doing well *Postpartum/postop: routine care *Dispo: after rapid hiv back (3rd trimester one not seen in the system)  B POS / Rubella Immune /RPR negative / HIV pending / HepBsAg negative / Tdap UTD: unknown/Flu shot: not applicable/ pap and hpv neg 2014 / pumping  / Contraception: interval BTL / Circ: not applicable/ Follow up: pt told to call for 1wk incision check at [facility] and to go [facility] plan.   Durene Romans MD Attending Center for [facility])

## 2016-01-28 NOTE — Plan of Care (Signed)
Problem: Bowel/Gastric: Goal: Gastrointestinal status will improve Outcome: Progressing Passing gas and good bowel sounds. Marry Guan

## 2016-01-28 NOTE — Discharge Instructions (Signed)
Cesarean Delivery, Care After Refer to this sheet in the next few weeks. These instructions provide you with information on caring for yourself after your procedure. Your health care provider may also give you specific instructions. Your treatment has been planned according to current medical practices, but problems sometimes occur. Call your health care provider if you have any problems or questions after you go home. HOME CARE INSTRUCTIONS  If you have an On-Q pump, remove it on the 5th day after your surgery, by removing the dressing/bandage and pulling the pump out. Cover the site where the pump strings came out with a band-aid, as needed.  Only take over-the-counter or prescription medications as directed by your health care provider.  Do not drink alcohol, especially if you are breastfeeding or taking medication to relieve pain.  Do not  smoke tobacco.  Continue to use good perineal care. Good perineal care includes:  Wiping your perineum from front to back.  Keeping your perineum clean.  Check your surgical cut (incision) daily for increased redness, drainage, swelling, or separation of skin.  Shower and clean your incision gently with soap and water every day, by letting warm and soapy water run over the incision, and then pat it dry. If your health care provider says it is okay, leave the incision uncovered. Use a bandage (dressing) if the incision is draining fluid or appears irritated. If the adhesive strips across the incision do not fall off within 7 days, carefully peel them off, after a shower.  Hug a pillow when coughing or sneezing until your incision is healed. This helps to relieve pain.  Do not use tampons, douches or have sexual intercourse, until your health care provider says it is okay.  Wear a well-fitting bra that provides breast support.  Limit wearing support panties or control-top hose.  Drink enough fluids to keep your urine clear or pale  yellow.  Eat high-fiber foods such as whole grain cereals and breads, brown rice, beans, and fresh fruits and vegetables every day. These foods may help prevent or relieve constipation.  Resume activities such as climbing stairs, driving, lifting, exercising, or traveling as directed by your health care provider.  Try to have someone help you with your household activities and your newborn for at least a few days after you leave the hospital.  Rest as much as possible. Try to rest or take a nap when your newborn is sleeping.  Increase your activities gradually.  Do not lift more than 15lbs until directed by a provider.  Keep all of your scheduled postpartum appointments. It is very important to keep your scheduled follow-up appointments. At these appointments, your health care provider will be checking to make sure that you are healing physically and emotionally. SEEK MEDICAL CARE IF:   You are passing large clots from your vagina. Save any clots to show your health care provider.  You have a foul smelling discharge from your vagina.  You have trouble urinating.  You are urinating frequently.  You have pain when you urinate.  You have a change in your bowel movements.  You have increasing redness, pain, or swelling near your incision.  You have pus draining from your incision.  Your incision is separating.  You have painful, hard, or reddened breasts.  You have a severe headache.  You have blurred vision or see spots.  You feel sad or depressed.  You have thoughts of hurting yourself or your newborn.  You have questions about your  care, the care of your newborn, or medications. °· You are dizzy or light-headed. °· You have a rash. °· You have pain, redness, or swelling at the site of the removed intravenous access (IV) tube. °· You have nausea or vomiting. °· You stopped breastfeeding and have not had a menstrual period within 12 weeks of stopping. °· You are not  breastfeeding and have not had a menstrual period within 12 weeks of delivery. °· You have a fever. °SEEK IMMEDIATE MEDICAL CARE IF: °· You have persistent pain. °· You have chest pain. °· You have shortness of breath. °· You faint. °· You have leg pain. °· You have stomach pain. °· Your vaginal bleeding saturates 2 or more sanitary pads in 1 hour. °MAKE SURE YOU:  °· Understand these instructions. °· Will watch your condition. °· Will get help right away if you are not doing well or get worse. °Document Released: 02/06/2002 Document Revised: 10/01/2013 Document Reviewed: 01/12/2012 °ExitCare® Patient Information ©2015 [employer]. This information is not intended to replace advice given to you by your health care provider. Make sure you discuss any questions you have with your health care provider. ° ° °

## 2016-01-28 NOTE — Discharge Summary (Signed)
Obstetrical Discharge Summary  Date of Admission: 01/25/2016 Date of Discharge: 01/28/2016  Primary OB: [facility]  Gestational Age at Delivery: [redacted]w[redacted]d   Antepartum complications: BMI [id number], AMA Reason for Admission: active labor Date of Delivery: 01/25/2016  Delivered By: Mora Bellman, MD Delivery Type: primary cesarean section, low transverse incision Intrapartum complications/course: Non-reassuring Fetal Status Anesthesia: spinal Placenta: Delivered and expressed via active management. Intact: yes. To pathology: yes.  Laceration: n/a Episiotomy: none EBL: 657mL Baby: Liveborn female, APGARs 5/4/7, weight 3440 g.    Discharge Diagnosis: Delivered.  Postpartum course: routine, uncomplicated. Meeting all PO/PP goals including BMs prior to discharge Discharge Vital Signs:  Current Vital Signs 24h Vital Sign Ranges  T 98.6 F (37 C) Temp  Avg: 99 F (37.2 C)  Min: 98.6 F (37 C)  Max: 99.4 F (37.4 C)  BP 114/66 BP  Min: 114/66  Max: 138/79  HR (!) 101 (nurse notified Victoria Rodriguez)) Pulse  Avg: 99  Min: 95  Max: 105  RR 16 Resp  Avg: 17.5  Min: 16  Max: 18  SaO2 99 % Not Delivered SpO2  Avg: 99.5 %  Min: 99 %  Max: 100 %       24 Hour I/O Current Shift I/O  Time Ins Outs No intake/output data recorded. No intake/output data recorded.   Discharge Exam:  General: NAD Abdomen: +BS, mildly distended and tympanic, nttp. C/d/i incision  Perineum: deferred Skin:  Warm and dry.  Cardiovascular: S1, S2 normal, no murmur, rub or gallop, regular rate and rhythm Respiratory:  Clear to auscultation bilateral. Normal respiratory effort Extremities: no c/c/e   Recent Labs Lab 01/25/16 1145 01/26/16 0510  WBC 7.1 14.4*  HGB 11.5* 10.1*  HCT 34.7* 30.5*  PLT 301 281    Disposition: Home  Rh Immune globulin given: not applicable Rubella vaccine given: not applicable Tdap vaccine given in AP or PP setting: ordered PP Flu vaccine given in AP or PP setting: no  Contraception: interval  BTL  Prenatal/Postnatal Panel: B POS / Rubella Immune /RPR negative / HIV neg / HepBsAg negative / Tdap UTD: unknown/Flu shot: not applicable/ pap and hpv neg 2014 / pumping  / Contraception: interval BTL / Circ: not applicable/ Follow up: pt told to call for 1wk incision check at [facility] and to go [facility] plan.   Plan:  Victoria Rodriguez was discharged to home in good condition. Follow-up appointment with [facility] in 1 week for an incision check visit  Discharge Medications:   Medication List    TAKE these medications   acetaminophen 325 MG tablet Commonly known as:  TYLENOL Take 650 mg by mouth every 6 (six) hours as needed for mild pain, moderate pain or headache.   calcium carbonate 500 MG chewable tablet Commonly known as:  TUMS - dosed in mg elemental calcium Chew 1 tablet by mouth as needed for indigestion or heartburn.   docusate sodium 100 MG capsule Commonly known as:  COLACE Take 1 capsule (100 mg total) by mouth 2 (two) times daily.   ibuprofen 600 MG tablet Commonly known as:  ADVIL,MOTRIN Take 1 tablet (600 mg total) by mouth every 6 (six) hours as needed.   meclizine 25 MG tablet Commonly known as:  ANTIVERT Take 1 tablet (25 mg total) by mouth 3 (three) times daily as needed for dizziness.   oxyCODONE-acetaminophen 5-325 MG tablet Commonly known as:  ROXICET Take 1-2 tablets by mouth every 6 (six) hours as needed for moderate pain or severe pain.  prenatal multivitamin Tabs tablet Take 1 tablet by mouth daily at 12 noon.   senna-docusate 8.6-50 MG tablet Commonly known as:  Senokot-S Take 1 tablet by mouth at bedtime as needed for mild constipation.   simethicone 80 MG chewable tablet Commonly known as:  MYLICON Chew 1 tablet (80 mg total) by mouth 4 (four) times daily as needed for flatulence.       Durene Romans MD Attending Center for [facility])

## 2016-02-04 ENCOUNTER — Ambulatory Visit (INDEPENDENT_AMBULATORY_CARE_PROVIDER_SITE_OTHER): Payer: Medicaid Other | Admitting: Obstetrics

## 2016-02-04 ENCOUNTER — Encounter: Payer: Self-pay | Admitting: Obstetrics

## 2016-02-04 DIAGNOSIS — Z3009 Encounter for other general counseling and advice on contraception: Secondary | ICD-10-CM | POA: Diagnosis not present

## 2016-02-04 NOTE — Progress Notes (Signed)
Subjective:     Victoria Rodriguez is a 35 y.o. female who presents for a postpartum visit. She is 2 weeks postpartum following a low cervical transverse Cesarean section. I have fully reviewed the prenatal and intrapartum course. The delivery was at 104 gestational weeks. Outcome: primary cesarean section, low transverse incision. Anesthesia: spinal. Postpartum course has been normal. Baby's course has been NICU for meconium. Baby is feeding by breast. Bleeding thin lochia. Bowel function is normal. Bladder function is normal. Patient is not sexually active. Contraception method is abstinence. Postpartum depression screening: negative.  Tobacco, alcohol and substance abuse history reviewed.  Adult immunizations reviewed including TDAP, rubella and varicella.  The following portions of the patient's history were reviewed and updated as appropriate: allergies, current medications, past family history, past medical history, past social history, past surgical history and problem list.  Review of Systems A comprehensive review of systems was negative.   Objective:    BP (!) 153/90   Pulse 75   Wt 221 lb (100.2 kg)   LMP 04/23/2015   BMI 39.15 kg/m    PE:        General:  Alert and no distress      Breasts:  Soft, non tender      Abdomen:  Incision C, D, I.  Non tender.      Extremities:  No C, C, E.   50% of 15 min visit spent on counseling and coordination of care.   Assessment:     Normal postpartum exam. Pap smear not done at today's visit.     Contraceptive counseling and advice  Plan:    1. Contraception: undecided 2. Continue PNV's 3. Follow up in: 4 weeks or as needed.   Healthy lifestyle practices reviewed

## 2016-02-12 ENCOUNTER — Ambulatory Visit (INDEPENDENT_AMBULATORY_CARE_PROVIDER_SITE_OTHER): Payer: Medicaid Other | Admitting: Obstetrics and Gynecology

## 2016-02-12 ENCOUNTER — Encounter: Payer: Self-pay | Admitting: Obstetrics and Gynecology

## 2016-02-12 DIAGNOSIS — H8111 Benign paroxysmal vertigo, right ear: Secondary | ICD-10-CM | POA: Diagnosis not present

## 2016-02-12 MED ORDER — MECLIZINE HCL 25 MG PO TABS: 25.0000 mg | ORAL_TABLET | Freq: Three times a day (TID) | ORAL | 0 refills | Status: DC | PRN

## 2016-02-12 NOTE — Progress Notes (Signed)
   Subjective:   Patient ID: Victoria Rodriguez, female    DOB: August 15, 1980, 35 y.o.   MRN: [id number]  Patient presents for Same Day Appointment  Chief Complaint  Patient presents with  . Dizziness    HPI: # Dizziness Having worsening of dizziness Patient recently pregnant and now about 3 weeks postpartum States that dizziness started her last month of pregnancy she would feel like she was going to faint but never fainted Was given meclizine but states that it made it worse so discontinued Dizziness had eventually improved but started back up again over the last week Patient states that she feels off balance and has some black dots in her vision Feels like room spins: yes Lightheadedness when stands: yes Palpitations or heart racing: no Taking blood thinners: no  Symptoms Hearing Loss: no Ear Pain or fullness: no Nausea or vomiting: no Vision difficulty or double vision: yes Falls: no Head trauma: no Weakness in arm or leg: no Speaking problems: no Headache: yes  Review of Systems   See HPI for ROS.   Smoking status - Never smoker  Past medical history, surgical, family, and social history reviewed and updated in the EMR as appropriate.  Objective:  BP (!) 144/72   Pulse 83   Temp 98.3 F (36.8 C) (Oral)   Wt 221 lb (100.2 kg)   BMI 39.15 kg/m  Vitals and nursing note reviewed  Physical Exam  Constitutional: She is oriented to person, place, and time and well-developed, well-nourished, and in no distress.  HENT:  Head: Normocephalic and atraumatic.  Right Ear: Hearing, tympanic membrane, external ear and ear canal normal.  Left Ear: Hearing, tympanic membrane, external ear and ear canal normal.  Eyes: Conjunctivae and EOM are normal. Pupils are equal, round, and reactive to light.  Nystagmus appreciated to right with +Dix-Hallpike test  Neck: Normal range of motion. Neck supple.  Cardiovascular: Normal rate, regular rhythm and normal heart sounds.     Pulmonary/Chest: Effort normal and breath sounds normal.  Neurological: She is alert and oriented to person, place, and time. No cranial nerve deficit.  Grossly non-focal. Dix-Hallpike test positive on right.    Assessment & Plan:  BPPV (benign paroxysmal positional vertigo) Dizziness appears to be due to BPPV. Dix-Hallpike test positive on the right. No signs of central process. No red flags. Patient given handout on Epley manuveur to perform at home to correct. Rx for refill of meclizine given as patient would like to try again. Referral placed for vestibular rehab. Follow-up as needed.    Orders Placed This Encounter  Procedures  . Ambulatory referral to Physical Therapy    Referral Priority:   Routine    Referral Type:   Physical Medicine    Referral Reason:   Specialty Services Required    Requested Specialty:   Physical Therapy    Number of Visits Requested:   1    Meds ordered this encounter  Medications  . meclizine (ANTIVERT) 25 MG tablet    Sig: Take 1 tablet (25 mg total) by mouth 3 (three) times daily as needed for dizziness.    Dispense:  30 tablet    Refill:  [place], DO 02/12/2016, 2:47 PM PGY-3, [place]

## 2016-02-12 NOTE — Patient Instructions (Addendum)
Affected on right side  Epley Maneuver Self-Care WHAT IS THE EPLEY MANEUVER? The Epley maneuver is an exercise you can do to relieve symptoms of benign paroxysmal positional vertigo (BPPV). This condition is often just referred to as vertigo. BPPV is caused by the movement of tiny crystals (canaliths) inside your inner ear. The accumulation and movement of canaliths in your inner ear causes a sudden spinning sensation (vertigo) when you move your head to certain positions. Vertigo usually lasts about 30 seconds. BPPV usually occurs in just one ear. If you get vertigo when you lie on your left side, you probably have BPPV in your left ear. Your health care provider can tell you which ear is involved.  BPPV may be caused by a head injury. Many people older than 50 get BPPV for unknown reasons. If you have been diagnosed with BPPV, your health care provider may teach you how to do this maneuver. BPPV is not life threatening (benign) and usually goes away in time.  WHEN SHOULD I PERFORM THE EPLEY MANEUVER? You can do this maneuver at home whenever you have symptoms of vertigo. You may do the Epley maneuver up to 3 times a day until your symptoms of vertigo go away. HOW SHOULD I DO THE EPLEY MANEUVER? 1. Sit on the edge of a bed or table with your back straight. Your legs should be extended or hanging over the edge of the bed or table.  2. Turn your head halfway toward the affected ear.  3. Lie backward quickly with your head turned until you are lying flat on your back. You may want to position a pillow under your shoulders.  4. Hold this position for 30 seconds. You may experience an attack of vertigo. This is normal. Hold this position until the vertigo stops. 5. Then turn your head to the opposite direction until your unaffected ear is facing the floor.  6. Hold this position for 30 seconds. You may experience an attack of vertigo. This is normal. Hold this position until the vertigo stops. 7. Now  turn your whole body to the same side as your head. Hold for another 30 seconds.  8. You can then sit back up. ARE THERE RISKS TO THIS MANEUVER? In some cases, you may have other symptoms (such as changes in your vision, weakness, or numbness). If you have these symptoms, stop doing the maneuver and call your health care provider. Even if doing these maneuvers relieves your vertigo, you may still have dizziness. Dizziness is the sensation of light-headedness but without the sensation of movement. Even though the Epley maneuver may relieve your vertigo, it is possible that your symptoms will return within 5 years. WHAT SHOULD I DO AFTER THIS MANEUVER? After doing the Epley maneuver, you can return to your normal activities. Ask your doctor if there is anything you should do at home to prevent vertigo. This may include:  Sleeping with two or more pillows to keep your head elevated.  Not sleeping on the side of your affected ear.  Getting up slowly from bed.  Avoiding sudden movements during the day.  Avoiding extreme head movement, like looking up or bending over.  Wearing a cervical collar to prevent sudden head movements. WHAT SHOULD I DO IF MY SYMPTOMS GET WORSE? Call your health care provider if your vertigo gets worse. Call your provider right way if you have other symptoms, including:   Nausea.  Vomiting.  Headache.  Weakness.  Numbness.  Vision changes.  This information is not intended to replace advice given to you by your health care provider. Make sure you discuss any questions you have with your health care provider.   Document Released: 05/22/2013 Document Reviewed: 05/22/2013 Elsevier Interactive Patient Education 2016 Elsevier Inc.   Benign Positional Vertigo Vertigo is the feeling that you or your surroundings are moving when they are not. Benign positional vertigo is the most common form of vertigo. The cause of this condition is not serious (is benign). This  condition is triggered by certain movements and positions (is positional). This condition can be dangerous if it occurs while you are doing something that could endanger you or others, such as driving.  CAUSES In many cases, the cause of this condition is not known. It may be caused by a disturbance in an area of the inner ear that helps your brain to sense movement and balance. This disturbance can be caused by a viral infection (labyrinthitis), head injury, or repetitive motion. RISK FACTORS This condition is more likely to develop in:  Women.  People who are 54 years of age or older. SYMPTOMS Symptoms of this condition usually happen when you move your head or your eyes in different directions. Symptoms may start suddenly, and they usually last for less than a minute. Symptoms may include:  Loss of balance and falling.  Feeling like you are spinning or moving.  Feeling like your surroundings are spinning or moving.  Nausea and vomiting.  Blurred vision.  Dizziness.  Involuntary eye movement (nystagmus). Symptoms can be mild and cause only slight annoyance, or they can be severe and interfere with daily life. Episodes of benign positional vertigo may return (recur) over time, and they may be triggered by certain movements. Symptoms may improve over time. DIAGNOSIS This condition is usually diagnosed by medical history and a physical exam of the head, neck, and ears. You may be referred to a health care provider who specializes in ear, nose, and throat (ENT) problems (otolaryngologist) or a provider who specializes in disorders of the nervous system (neurologist). You may have additional testing, including:  MRI.  A CT scan.  Eye movement tests. Your health care provider may ask you to change positions quickly while he or she watches you for symptoms of benign positional vertigo, such as nystagmus. Eye movement may be tested with an electronystagmogram (ENG), caloric stimulation,  the Dix-Hallpike test, or the roll test.  An electroencephalogram (EEG). This records electrical activity in your brain.  Hearing tests. TREATMENT Usually, your health care provider will treat this by moving your head in specific positions to adjust your inner ear back to normal. Surgery may be needed in severe cases, but this is rare. In some cases, benign positional vertigo may resolve on its own in 2-4 weeks. HOME CARE INSTRUCTIONS Safety  Move slowly.Avoid sudden body or head movements.  Avoid driving.  Avoid operating heavy machinery.  Avoid doing any tasks that would be dangerous to you or others if a vertigo episode would occur.  If you have trouble walking or keeping your balance, try using a cane for stability. If you feel dizzy or unstable, sit down right away.  Return to your normal activities as told by your health care provider. Ask your health care provider what activities are safe for you. General Instructions  Take over-the-counter and prescription medicines only as told by your health care provider.  Avoid certain positions or movements as told by your health care provider.  Drink enough fluid  to keep your urine clear or pale yellow.  Keep all follow-up visits as told by your health care provider. This is important. SEEK MEDICAL CARE IF:  You have a fever.  Your condition gets worse or you develop new symptoms.  Your family or friends notice any behavioral changes.  Your nausea or vomiting gets worse.  You have numbness or a "pins and needles" sensation. SEEK IMMEDIATE MEDICAL CARE IF:  You have difficulty speaking or moving.  You are always dizzy.  You faint.  You develop severe headaches.  You have weakness in your legs or arms.  You have changes in your hearing or vision.  You develop a stiff neck.  You develop sensitivity to light.   This information is not intended to replace advice given to you by your health care provider. Make sure  you discuss any questions you have with your health care provider.   Document Released: 02/22/2006 Document Revised: 02/05/2015 Document Reviewed: 09/09/2014 Elsevier Interactive Patient Education [employer].

## 2016-02-16 DIAGNOSIS — H8110 Benign paroxysmal vertigo, unspecified ear: Secondary | ICD-10-CM

## 2016-02-16 HISTORY — DX: Benign paroxysmal vertigo, unspecified ear: H81.10

## 2016-02-16 NOTE — Assessment & Plan Note (Addendum)
Dizziness appears to be due to BPPV. Dix-Hallpike test positive on the right. No signs of central process. No red flags. Patient given handout on Epley manuveur to perform at home to correct. Rx for refill of meclizine given as patient would like to try again. Referral placed for vestibular rehab. Follow-up as needed.

## 2016-02-24 ENCOUNTER — Ambulatory Visit: Payer: Medicaid Other | Attending: Family Medicine | Admitting: Physical Therapy

## 2016-02-24 ENCOUNTER — Encounter: Payer: Self-pay | Admitting: Physical Therapy

## 2016-02-24 DIAGNOSIS — R42 Dizziness and giddiness: Secondary | ICD-10-CM | POA: Insufficient documentation

## 2016-02-24 NOTE — Therapy (Signed)
[facility] [address] [place], [place], [address] Phone: [phone]   Fax:  [phone]   Patient Details  Name: Victoria Rodriguez MRN: [id number] Date of Birth: 24-Jan-1981 Referring Provider: Luiz Blare, DO  Encounter Date: 02/24/2016    Past Medical History:  Diagnosis Date  . Abscess of breast 05/14/2015  . Chlamydia contact, treated   . Hidradenitis suppurativa    surgical  . Microscopic hematuria 03/21/2015   03/20/15 urine micro with 5-10 RBC. Recommend recheck UA & micro at next office visit.     Past Surgical History:  Procedure Laterality Date  . AXILLARY HIDRADENITIS EXCISION  2011   bilateral  . CESAREAN SECTION N/A 01/25/2016   Procedure: CESAREAN SECTION;  Surgeon: Mora Bellman, MD;  Location: [facility];  Service: Obstetrics;  Laterality: N/A;  . DILATION AND CURETTAGE OF UTERUS    . HYDRADENITIS EXCISION Left 02/12/2014   Procedure: EXCISION HIDRADENITIS AXILLA;  Surgeon: Coralie Keens, MD;  Location: [facility];  Service: General;  Laterality: Left;  . IRRIGATION AND DEBRIDEMENT ABSCESS Left 11/16/2013   Procedure: IRRIGATION AND DEBRIDEMENT LEFT AXILLARY ABSCESS;  Surgeon: Pedro Earls, MD;  Location: [facility];  Service: General;  Laterality: Left;    There were no vitals filed for this visit.       Subjective Assessment - 02/24/16 1226    Subjective Onset of vertigo during last few weeks of pregnancy (beginning of August). Pt states, "the vertigo hasn't really been that bad for the past 4 fo 5 days."   Currently in Pain? No/denies            [facility] PT Assessment - 02/24/16 0001      Assessment   Medical Diagnosis BPPV   Referring Provider Luiz Blare, DO   Onset Date/Surgical Date 12/30/15     Precautions   Precautions None     Restrictions   Weight Bearing Restrictions No     Balance Screen   Has the patient fallen in the past 6 months No    Has the patient had a decrease in activity level because of a fear of falling?  No   Is the patient reluctant to leave their home because of a fear of falling?  No     Prior Function   Level of Independence Independent   Vocation Unemployed   Leisure has a new born            Vestibular Assessment - 02/24/16 0001      Symptom Behavior   Type of Dizziness Spinning   Frequency of Dizziness daily   Duration of Dizziness < 1 minute   Aggravating Factors Forward bending;Rolling to right;Rolling to left;Sit to stand   Relieving Factors No known relieving factors     Occulomotor Exam   Occulomotor Alignment Normal   Spontaneous Absent   Gaze-induced Absent   Smooth Pursuits Intact   Saccades Intact     Positional Testing   Dix-Hallpike Dix-Hallpike Right;Dix-Hallpike Left   Sidelying Test Sidelying Right;Sidelying Left   Horizontal Canal Testing Horizontal Canal Right;Horizontal Canal Left     Dix-Hallpike Right   Dix-Hallpike Right Duration NA   Dix-Hallpike Right Symptoms No nystagmus     Dix-Hallpike Left   Dix-Hallpike Left Duration NA   Dix-Hallpike Left Symptoms No nystagmus     Sidelying Right   Sidelying Right Duration NA   Sidelying Right Symptoms No nystagmus     Sidelying Left  Sidelying Left Duration NA   Sidelying Left Symptoms No nystagmus     Horizontal Canal Right   Horizontal Canal Right Duration NA   Horizontal Canal Right Symptoms Normal     Horizontal Canal Left   Horizontal Canal Left Duration NA   Horizontal Canal Left Symptoms Normal        Patient arrived to this evaluation with report of no symptoms of vertigo for past 4-5 days. This PT performed quick oculomotor screen and positional testing. All positional testing (-) and asymptomatic. Therefore, PT evaluation not completed. Pt verbalized understanding and was in full agreement.                          Visit Diagnosis: Dizziness and  giddiness     Problem List Patient Active Problem List   Diagnosis Date Noted  . BPPV (benign paroxysmal positional vertigo) 02/16/2016  . Group B Streptococcus carrier, +RV culture, currently pregnant 01/09/2016  . BMI 40.0-44.9, adult ([place]) 12/23/2015  . Supervision of normal pregnancy 12/08/2015  . Advanced maternal age in multigravida   . Chronic Recurrent Hidradenitis Suppurativa 04/18/2012  . Obesity affecting pregnancy in third trimester 02/29/2012  . Keratosis punctata 02/29/2012    Billie Ruddy, PT, DPT [facility] [address] [place], [place], [address] Phone: [phone]   Fax:  [phone] 02/24/16, 12:37 PM  Name: Victoria Rodriguez MRN: [id number] Date of Birth: [birth year]

## 2016-03-01 ENCOUNTER — Ambulatory Visit (INDEPENDENT_AMBULATORY_CARE_PROVIDER_SITE_OTHER): Payer: Medicaid Other | Admitting: Internal Medicine

## 2016-03-01 ENCOUNTER — Encounter: Payer: Self-pay | Admitting: Internal Medicine

## 2016-03-01 DIAGNOSIS — L732 Hidradenitis suppurativa: Secondary | ICD-10-CM | POA: Diagnosis not present

## 2016-03-01 MED ORDER — DOXYCYCLINE HYCLATE 100 MG PO TABS: 100.0000 mg | ORAL_TABLET | Freq: Two times a day (BID) | ORAL | 0 refills | Status: DC

## 2016-03-01 NOTE — Patient Instructions (Addendum)
It was nice meeting you today Ms. Victoria Rodriguez!  Please begin taking doxycycline twice a day for the next two weeks. DO NOT breastfeed while you are on this medication. If your symptoms do not improve by the time you finish the antibiotics, or if you develop fevers or chills, please call to schedule another appointment.   If you have any questions or concerns, please feel free to call the clinic.   Be well,  Dr. Avon Gully

## 2016-03-01 NOTE — Assessment & Plan Note (Signed)
Two new lesions on L breast, likely 2/2 friction from breastfeeding. Patient no longer breastfeeding due to pain. No fevers, chills, or other associated symptoms to suggest systemic infection.  - Doxycycline 100mg  BID x14d. Can consider longer course if no improvement.  - Instructed NOT to breastfeed while on this medication, though patient does not intend to continue breastfeeding - Letter written for [facility] so patient can receive more formula for daughter - Return precautions given

## 2016-03-01 NOTE — Progress Notes (Signed)
   Subjective:    Patient ID: Victoria Rodriguez, female    DOB: 01-07-1981, 35 y.o.   MRN: [id number]  HPI  Patient presents with abscesses on L breast.   Breast abscesses Patient reports abscess under areola on L breast first appearing about a week ago. Lesion began to drain three days ago, and has drained consistently since then. She noticed another smaller lesion on her L lateral breast a few days ago that began draining today. Patient has 43 week old daughter and has been breastfeeding, though stopped breastfeeding four days ago due to pain. She does not intend to continue breastfeeding. Denies fevers or chills. Has a history of suppurative hydradenitis for which she has had surgical procedures in her axillae.  Review of Systems See HPI.    Objective:   Physical Exam  Constitutional: She is oriented to person, place, and time.  Overweight female in NAD  Pulmonary/Chest: Effort normal. No respiratory distress.    Neurological: She is alert and oriented to person, place, and time.  Psychiatric: She has a normal mood and affect. Her behavior is normal.      Assessment & Plan:  Chronic Recurrent Hidradenitis Suppurativa Two new lesions on L breast, likely 2/2 friction from breastfeeding. Patient no longer breastfeeding due to pain. No fevers, chills, or other associated symptoms to suggest systemic infection.  - Doxycycline 100mg  BID x14d. Can consider longer course if no improvement.  - Instructed NOT to breastfeed while on this medication, though patient does not intend to continue breastfeeding - Letter written for [facility] so patient can receive more formula for daughter - Return precautions given  Adin Hector, MD, MPH PGY-2 [facility] Pager [phone]

## 2016-03-03 ENCOUNTER — Encounter: Payer: Self-pay | Admitting: Obstetrics & Gynecology

## 2016-03-03 ENCOUNTER — Ambulatory Visit (INDEPENDENT_AMBULATORY_CARE_PROVIDER_SITE_OTHER): Payer: Medicaid Other | Admitting: Obstetrics & Gynecology

## 2016-03-03 VITALS — BP 123/85 | HR 89 | Temp 98.1°F | Wt 216.2 lb

## 2016-03-03 DIAGNOSIS — Z01818 Encounter for other preprocedural examination: Secondary | ICD-10-CM | POA: Diagnosis not present

## 2016-03-03 NOTE — Progress Notes (Signed)
Subjective:     Victoria Rodriguez is a 35 y.o. female who presents for a postpartum visit. She is 5 weeks postpartum following a low cervical transverse Cesarean section. I have fully reviewed the prenatal and intrapartum course. The delivery was at 39.4 gestational weeks. Outcome: primary cesarean section, low vertical incision. Anesthesia: epidural. Postpartum course has been good, incision irritated. Baby's course has been complicated by hospitalization for meconium aspiration. Baby is feeding by bottle -  . Bleeding menstrual bleed. Bowel function is normal. Bladder function is normal. Patient is not sexually active. Contraception method is abstinence and but wants BTL. Postpartum depression screening: negative.  The following portions of the patient's history were reviewed and updated as appropriate: allergies, current medications, past family history, past medical history, past social history, past surgical history and problem list.  Review of Systems Pertinent items are noted in HPI.   Objective:    BP 123/85   Pulse 89   Temp 98.1 F (36.7 C) (Oral)   Wt 216 lb 3.2 oz (98.1 kg)   LMP 03/03/2016 (Exact Date)   Breastfeeding? No   BMI 38.30 kg/m   General:  alert, cooperative and no distress   Breasts:  folliculitis left breat        Abdomen: soft, non-tender; bowel sounds normal; no masses,  no organomegaly and incision healing well   Vulva:  not evaluated              Rectal Exam: Not performed.        Assessment:     normal postpartum exam. Pap smear not done at today's visit.   Plan:    1. Contraception: tubal ligation 2. Schedule LBTL, instructions given 3. Follow up as needed.    Woodroe Mode, MD 03/03/2016

## 2016-03-03 NOTE — Progress Notes (Signed)
Patient is in office for 6 week postpartum check from c-section on 01-25-16. Patient states that she is concerned about incision because it is a little irritated. Patient also states that she has an abscess on her left breast that she is concerned about.

## 2016-03-03 NOTE — Patient Instructions (Signed)
Laparoscopic Tubal Ligation Laparoscopic tubal ligation is a procedure that closes the fallopian tubes at a time other than right after childbirth. When the fallopian tubes are closed, the eggs that are released from the ovaries cannot enter the uterus, and sperm cannot reach the egg. Tubal ligation is also known as getting your "tubes tied." Tubal ligation is done so you will not be able to get pregnant or have a baby. Although this procedure may be undone (reversed), it should be considered permanent and irreversible. If you want to have future pregnancies, you should not have this procedure. LET [facility] CARE PROVIDER KNOW ABOUT:  Any allergies you have.  All medicines you are taking, including vitamins, herbs, eye drops, creams, and over-the-counter medicines. This includes any use of steroids, either by mouth or in cream form.  Previous problems you or members of your family have had with the use of anesthetics.  Any blood disorders you have.  Previous surgeries you have had.  Any medical conditions you may have.  Possibility of pregnancy, if this applies.  Any past pregnancies. RISKS AND COMPLICATIONS  Infection.  Bleeding.  Injury to surrounding organs.  Side effects from anesthetics.  Failure of the procedure.  Ectopic pregnancy.  Future regret about having the procedure done. BEFORE THE PROCEDURE  Ask your health care provider about:  Changing or stopping your regular medicines. This is especially important if you are taking diabetes medicines or blood thinners.  Taking medicines such as aspirin and ibuprofen. These medicines can thin your blood. Do not take these medicines before your procedure if your health care provider instructs you not to.  Follow instructions from your health care provider about eating and drinking restrictions.  Plan to have someone take you home after the procedure.  If you go home right after the procedure, plan to have someone  with you for 24 hours. PROCEDURE  You will be given one or more of the following:  A medicine that helps you relax (sedative).  A medicine that numbs the area (local anesthetic).  A medicine that makes you fall asleep (general anesthetic).  A medicine that is injected into an area of your body that numbs everything below the injection site (regional anesthetic).  If you have been given general anesthetic, a tube will be put down your throat to help you breathe.  Two small cuts (incisions) will be made in the lower abdominal area and near the belly button.  Your bladder may be emptied with a small tube (catheter).  Your abdomen will be inflated with a safe gas (carbon dioxide). This will help to give the surgeon room to operate and visualize, and it will help the surgeon to avoid other organs.  A thin, lighted tube (laparoscope) with a camera attached will be inserted into your abdomen through one of the incisions near the belly button. Other small instruments will be inserted through the other abdominal incision.  The fallopian tubes will be tied off or burned (cauterized), or they will be blocked with a clip, ring, or clamp. In many cases, a small portion in the center of each fallopian tube will also be removed.  After the fallopian tubes are blocked, the gas will be released from the abdomen.  The incisions will be closed with stitches (sutures).  A bandage (dressing) will be placed over the incisions. The procedure may vary among health care providers and hospitals. AFTER THE PROCEDURE  Your blood pressure, heart rate, breathing rate, and blood oxygen level  will be monitored often until the medicines you were given have worn off.  You will be given pain medicine as needed.  If you had general anesthetic, you may have some mild discomfort in your throat. This is from the breathing tube that was placed in your throat while you were sleeping.  You may experience discomfort in  the shoulder area from some trapped air between your liver and your diaphragm. This sensation is normal, and it will slowly go away on its own.  You will have some mild abdominal discomfort for 3--7 days.   This information is not intended to replace advice given to you by your health care provider. Make sure you discuss any questions you have with your health care provider.   Document Released: 08/23/2000 Document Revised: 10/01/2014 Document Reviewed: 08/28/2011 Elsevier Interactive Patient Education [employer].

## 2016-03-05 ENCOUNTER — Encounter (HOSPITAL_COMMUNITY): Payer: Self-pay | Admitting: *Deleted

## 2016-03-08 ENCOUNTER — Other Ambulatory Visit: Payer: Self-pay | Admitting: Obstetrics & Gynecology

## 2016-03-22 NOTE — Patient Instructions (Signed)
Your procedure is scheduled on:  Friday, Nov. 3, 2017  Enter through the [employer] [facility] at:  7:45 AM  Pick up the phone at the desk and dial [phone].  Call this number if you have problems the morning of surgery: [phone].  Remember: Do NOT eat food or drink after:  Midnight Thursday, Nov. 2, 2017  Take these medicines the morning of surgery with a SIP OF WATER:  None  Stop ALL herbal medications at this time   Do NOT wear jewelry (body piercing), metal hair clips/bobby pins, make-up, or nail polish. Do NOT wear lotions, powders, or perfumes.  You may wear deodorant. Do NOT shave for 48 hours prior to surgery. Do NOT bring valuables to the hospital. Contacts, dentures, or bridgework may not be worn into surgery.  Have a responsible adult drive you home and stay with you for 24 hours after your procedure

## 2016-03-23 ENCOUNTER — Encounter (HOSPITAL_COMMUNITY): Payer: Self-pay

## 2016-03-23 ENCOUNTER — Other Ambulatory Visit: Payer: Self-pay | Admitting: Obstetrics & Gynecology

## 2016-03-23 ENCOUNTER — Encounter (HOSPITAL_COMMUNITY)
Admission: RE | Admit: 2016-03-23 | Discharge: 2016-03-23 | Disposition: A | Discharge status: Home / Self Care | Payer: Medicaid Other | Source: Ambulatory Visit | Attending: Obstetrics & Gynecology | Admitting: Obstetrics & Gynecology

## 2016-03-23 DIAGNOSIS — Z01818 Encounter for other preprocedural examination: Secondary | ICD-10-CM | POA: Insufficient documentation

## 2016-03-23 LAB — CBC
HCT: 34.4 % — ABNORMAL LOW (ref 36.0–46.0)
Hemoglobin: 11.1 g/dL — ABNORMAL LOW (ref 12.0–15.0)
MCH: 26.2 pg (ref 26.0–34.0)
MCHC: 32.3 g/dL (ref 30.0–36.0)
MCV: 81.1 fL (ref 78.0–100.0)
PLATELETS: 374 10*3/uL (ref 150–400)
RBC: 4.24 MIL/uL (ref 3.87–5.11)
RDW: 15.7 % — ABNORMAL HIGH (ref 11.5–15.5)
WBC: 5.8 10*3/uL (ref 4.0–10.5)

## 2016-04-01 NOTE — Anesthesia Preprocedure Evaluation (Addendum)
Anesthesia Evaluation  Patient identified by MRN, date of birth, ID band Patient awake    Reviewed: Allergy & Precautions, NPO status , Patient's Chart, lab work & pertinent test results  History of Anesthesia Complications Negative for: history of anesthetic complications  Airway Mallampati: II  TM Distance: >3 FB Neck ROM: Full    Dental  (+) Teeth Intact   Pulmonary neg pulmonary ROS,    Pulmonary exam normal        Cardiovascular negative cardio ROS Normal cardiovascular exam     Neuro/Psych negative neurological ROS     GI/Hepatic negative GI ROS, Neg liver ROS,   Endo/Other  neg diabetesMorbid obesity  Renal/GU negative Renal ROS     Musculoskeletal   Abdominal (+) + obese,   Peds  Hematology negative hematology ROS (+)   Anesthesia Other Findings   Reproductive/Obstetrics (+) Pregnancy                            Anesthesia Physical  Anesthesia Plan  ASA: II  Anesthesia Plan: General   Post-op Pain Management:    Induction: Intravenous  Airway Management Planned: Oral ETT  Additional Equipment:   Intra-op Plan:   Post-operative Plan: Extubation in OR  Informed Consent: I have reviewed the patients History and Physical, chart, labs and discussed the procedure including the risks, benefits and alternatives for the proposed anesthesia with the patient or authorized representative who has indicated his/her understanding and acceptance.   Dental advisory given  Plan Discussed with: CRNA and Anesthesiologist  Anesthesia Plan Comments:        Anesthesia Quick Evaluation

## 2016-04-02 ENCOUNTER — Ambulatory Visit (HOSPITAL_COMMUNITY): Payer: Medicare Other | Admitting: Anesthesiology

## 2016-04-02 ENCOUNTER — Encounter (HOSPITAL_COMMUNITY): Payer: Self-pay | Admitting: Anesthesiology

## 2016-04-02 ENCOUNTER — Ambulatory Visit (HOSPITAL_COMMUNITY)
Admission: RE | Admit: 2016-04-02 | Discharge: 2016-04-02 | Disposition: A | Discharge status: Home / Self Care | Payer: Medicare Other | Source: Ambulatory Visit | Attending: Obstetrics & Gynecology | Admitting: Obstetrics & Gynecology

## 2016-04-02 ENCOUNTER — Encounter (HOSPITAL_COMMUNITY)
Admission: RE | Disposition: A | Discharge status: Home / Self Care | Payer: Self-pay | Source: Ambulatory Visit | Attending: Obstetrics & Gynecology

## 2016-04-02 DIAGNOSIS — H8110 Benign paroxysmal vertigo, unspecified ear: Secondary | ICD-10-CM | POA: Diagnosis not present

## 2016-04-02 DIAGNOSIS — Z302 Encounter for sterilization: Secondary | ICD-10-CM | POA: Diagnosis not present

## 2016-04-02 DIAGNOSIS — L852 Keratosis punctata (palmaris et plantaris): Secondary | ICD-10-CM | POA: Diagnosis not present

## 2016-04-02 DIAGNOSIS — L732 Hidradenitis suppurativa: Secondary | ICD-10-CM | POA: Diagnosis not present

## 2016-04-02 HISTORY — PX: LAPAROSCOPIC TUBAL LIGATION: SHX1937

## 2016-04-02 LAB — PREGNANCY, URINE: PREG TEST UR: NEGATIVE

## 2016-04-02 SURGERY — LIGATION, FALLOPIAN TUBE, LAPAROSCOPIC
Anesthesia: General | Site: Abdomen | Laterality: Bilateral

## 2016-04-02 MED ORDER — BUPIVACAINE HCL (PF) 0.25 % IJ SOLN
INTRAMUSCULAR | Status: AC
  Filled 2016-04-02: qty 30

## 2016-04-02 MED ORDER — SCOPOLAMINE 1 MG/3DAYS TD PT72
1.0000 | MEDICATED_PATCH | TRANSDERMAL | Status: DC
Start: 2016-04-02 — End: 2016-04-02
  Administered 2016-04-02: 1.5 mg via TRANSDERMAL

## 2016-04-02 MED ORDER — TRAMADOL-ACETAMINOPHEN 37.5-325 MG PO TABS: 1.0000 | ORAL_TABLET | Freq: Four times a day (QID) | ORAL | 0 refills | Status: DC | PRN

## 2016-04-02 MED ORDER — PROMETHAZINE HCL 25 MG/ML IJ SOLN
6.2500 mg | INTRAMUSCULAR | Status: DC | PRN
  Administered 2016-04-02: 6.25 mg via INTRAVENOUS

## 2016-04-02 MED ORDER — PROPOFOL 10 MG/ML IV BOLUS
INTRAVENOUS | Status: DC | PRN
  Administered 2016-04-02: 20 mg via INTRAVENOUS
  Administered 2016-04-02: 180 mg via INTRAVENOUS

## 2016-04-02 MED ORDER — PROPOFOL 10 MG/ML IV BOLUS
INTRAVENOUS | Status: AC
Start: 2016-04-02 — End: 2016-04-02
  Filled 2016-04-02: qty 20

## 2016-04-02 MED ORDER — MIDAZOLAM HCL 2 MG/2ML IJ SOLN
INTRAMUSCULAR | Status: DC | PRN
  Administered 2016-04-02: 2 mg via INTRAVENOUS

## 2016-04-02 MED ORDER — FENTANYL CITRATE (PF) 100 MCG/2ML IJ SOLN
INTRAMUSCULAR | Status: AC
  Filled 2016-04-02: qty 2

## 2016-04-02 MED ORDER — LIDOCAINE HCL (CARDIAC) 20 MG/ML IV SOLN
INTRAVENOUS | Status: AC
  Filled 2016-04-02: qty 5

## 2016-04-02 MED ORDER — BUPIVACAINE HCL (PF) 0.25 % IJ SOLN
INTRAMUSCULAR | Status: DC | PRN
  Administered 2016-04-02: 6 mL

## 2016-04-02 MED ORDER — FENTANYL CITRATE (PF) 100 MCG/2ML IJ SOLN
INTRAMUSCULAR | Status: DC | PRN
  Administered 2016-04-02: 50 ug via INTRAVENOUS
  Administered 2016-04-02 (×2): 25 ug via INTRAVENOUS
  Administered 2016-04-02 (×2): 50 ug via INTRAVENOUS

## 2016-04-02 MED ORDER — SCOPOLAMINE 1 MG/3DAYS TD PT72
MEDICATED_PATCH | TRANSDERMAL | Status: AC
  Filled 2016-04-02: qty 1

## 2016-04-02 MED ORDER — HYDROMORPHONE HCL 1 MG/ML IJ SOLN
0.2500 mg | INTRAMUSCULAR | Status: DC | PRN
  Administered 2016-04-02 (×4): 0.5 mg via INTRAVENOUS

## 2016-04-02 MED ORDER — HYDROMORPHONE HCL 1 MG/ML IJ SOLN
INTRAMUSCULAR | Status: AC
  Administered 2016-04-02: 0.5 mg via INTRAVENOUS
  Filled 2016-04-02: qty 1

## 2016-04-02 MED ORDER — PROMETHAZINE HCL 25 MG/ML IJ SOLN
INTRAMUSCULAR | Status: AC
  Administered 2016-04-02: 6.25 mg via INTRAVENOUS
  Filled 2016-04-02: qty 1

## 2016-04-02 MED ORDER — ROCURONIUM BROMIDE 100 MG/10ML IV SOLN
INTRAVENOUS | Status: DC | PRN
  Administered 2016-04-02: 10 mg via INTRAVENOUS
  Administered 2016-04-02: 30 mg via INTRAVENOUS

## 2016-04-02 MED ORDER — KETOROLAC TROMETHAMINE 30 MG/ML IJ SOLN
INTRAMUSCULAR | Status: DC | PRN
  Administered 2016-04-02: 30 mg via INTRAVENOUS

## 2016-04-02 MED ORDER — LACTATED RINGERS IV SOLN: INTRAVENOUS | Status: DC

## 2016-04-02 MED ORDER — LIDOCAINE HCL (CARDIAC) 20 MG/ML IV SOLN
INTRAVENOUS | Status: DC | PRN
  Administered 2016-04-02: 30 mg via INTRAVENOUS

## 2016-04-02 MED ORDER — DEXAMETHASONE SODIUM PHOSPHATE 4 MG/ML IJ SOLN
INTRAMUSCULAR | Status: AC
  Filled 2016-04-02: qty 1

## 2016-04-02 MED ORDER — SUGAMMADEX SODIUM 200 MG/2ML IV SOLN
INTRAVENOUS | Status: DC | PRN
  Administered 2016-04-02: 200 mg via INTRAVENOUS

## 2016-04-02 MED ORDER — MIDAZOLAM HCL 2 MG/2ML IJ SOLN
INTRAMUSCULAR | Status: AC
  Filled 2016-04-02: qty 2

## 2016-04-02 MED ORDER — DEXAMETHASONE SODIUM PHOSPHATE 10 MG/ML IJ SOLN
INTRAMUSCULAR | Status: DC | PRN
  Administered 2016-04-02: 4 mg via INTRAVENOUS

## 2016-04-02 MED ORDER — ONDANSETRON HCL 4 MG/2ML IJ SOLN
INTRAMUSCULAR | Status: DC | PRN
  Administered 2016-04-02: 4 mg via INTRAVENOUS

## 2016-04-02 MED ORDER — ONDANSETRON HCL 4 MG/2ML IJ SOLN
INTRAMUSCULAR | Status: AC
  Filled 2016-04-02: qty 2

## 2016-04-02 MED ORDER — LACTATED RINGERS IV SOLN
INTRAVENOUS | Status: DC
  Administered 2016-04-02 (×4): via INTRAVENOUS

## 2016-04-02 SURGICAL SUPPLY — 25 items
CATH ROBINSON RED A/P 16FR (CATHETERS) ×2 IMPLANT
CLIP FILSHIE TUBAL LIGA STRL (Clip) ×4 IMPLANT
CLOTH BEACON ORANGE TIMEOUT ST (SAFETY) ×2 IMPLANT
DRSG OPSITE POSTOP 3X4 (GAUZE/BANDAGES/DRESSINGS) ×2 IMPLANT
DRSG OPSITE POSTOP 4X10 (GAUZE/BANDAGES/DRESSINGS) ×2 IMPLANT
DURAPREP 26ML APPLICATOR (WOUND CARE) ×2 IMPLANT
GLOVE BIO SURGEON STRL SZ 6.5 (GLOVE) ×2 IMPLANT
GLOVE BIOGEL PI IND STRL 7.0 (GLOVE) ×2 IMPLANT
GLOVE BIOGEL PI INDICATOR 7.0 (GLOVE) ×2
GOWN STRL REUS W/TWL LRG LVL3 (GOWN DISPOSABLE) ×4 IMPLANT
LIQUID BAND (GAUZE/BANDAGES/DRESSINGS) IMPLANT
NEEDLE INSUFFLATION 120MM (ENDOMECHANICALS) ×2 IMPLANT
PACK LAPAROSCOPY BASIN (CUSTOM PROCEDURE TRAY) ×2 IMPLANT
PACK TRENDGUARD 450 HYBRID PRO (MISCELLANEOUS) IMPLANT
PACK TRENDGUARD 600 HYBRD PROC (MISCELLANEOUS) IMPLANT
PROTECTOR NERVE ULNAR (MISCELLANEOUS) ×4 IMPLANT
SET IRRIG TUBING LAPAROSCOPIC (IRRIGATION / IRRIGATOR) IMPLANT
SUT VICRYL 0 UR6 27IN ABS (SUTURE) ×2 IMPLANT
SUT VICRYL 4-0 PS2 18IN ABS (SUTURE) ×2 IMPLANT
TOWEL OR 17X24 6PK STRL BLUE (TOWEL DISPOSABLE) ×4 IMPLANT
TRENDGUARD 450 HYBRID PRO PACK (MISCELLANEOUS)
TRENDGUARD 600 HYBRID PROC PK (MISCELLANEOUS)
TROCAR XCEL DIL TIP R 11M (ENDOMECHANICALS) ×2 IMPLANT
WARMER LAPAROSCOPE (MISCELLANEOUS) ×2 IMPLANT
WATER STERILE IRR 1000ML POUR (IV SOLUTION) ×2 IMPLANT

## 2016-04-02 NOTE — Anesthesia Procedure Notes (Signed)
Performed by: Loreena Valeri, Dreama Saa

## 2016-04-02 NOTE — Discharge Instructions (Signed)
DISCHARGE INSTRUCTIONS: Laparoscopy  The following instructions have been prepared to help you care for yourself upon your return home today.  **You may begin taking Ibuprofen after 3:41 pm today**  Wound care:  Do not get the incision wet for the first 24 hours. The incision should be kept clean and dry.  The honeycomb bandage may be removed 2 days after surgery(Sunday).  Should the incision become sore, red, and swollen after the first week, check with your doctor.  Personal hygiene:  Shower the day after your procedure.  Activity and limitations:  Do NOT drive or operate any equipment today.  Do NOT lift anything more than 15 pounds for 2-3 weeks after surgery.  Do NOT rest in bed all day.  Walking is encouraged. Walk each day, starting slowly with 5-minute walks 3 or 4 times a day. Slowly increase the length of your walks.  Walk up and down stairs slowly.  Do NOT do strenuous activities, such as golfing, playing tennis, bowling, running, biking, weight lifting, gardening, mowing, or vacuuming for 2-4 weeks. Ask your doctor when it is okay to start.  No sexual intercourse for 2 weeks.  No douching, tub baths, pools, or Jacuzzis.  Diet: Eat a light meal as desired this evening. You may resume your usual diet tomorrow.  Return to work: This is dependent on the type of work you do. For the most part you can return to a desk job within a week of surgery. If you are more active at work, please discuss this with your doctor.  What to expect after your surgery: You may have a slight burning sensation when you urinate on the first day. You may have a very small amount of blood in the urine. Expect to have a small amount of vaginal discharge/light bleeding for 1-2 weeks. It is not unusual to have abdominal soreness and bruising for up to 2 weeks. You may be tired and need more rest for about 1 week. You may experience shoulder pain for 24-72 hours. Lying flat in bed may relieve  it.  Call your doctor for any of the following:  Develop a fever of 100.4 or greater  Inability to urinate 6 hours after discharge from hospital  Severe pain not relieved by pain medications  Persistent of heavy bleeding at incision site  Redness or swelling around incision site after a week  Increasing nausea or vomiting  Patient Signature________________________________________  Nurse Signature_________________________________________  Support person's signature__________________________________

## 2016-04-02 NOTE — Anesthesia Procedure Notes (Signed)
Procedure Name: Intubation Date/Time: 04/02/2016 8:57 AM Performed by: Darrik Richman G Pre-anesthesia Checklist: Patient identified, Emergency Drugs available, Suction available, Patient being monitored and Timeout performed Patient Re-evaluated:Patient Re-evaluated prior to inductionOxygen Delivery Method: Circle system utilized Preoxygenation: Pre-oxygenation with 100% oxygen Intubation Type: IV induction Ventilation: Mask ventilation without difficulty Laryngoscope Size: Mac and 3 Grade View: Grade II Tube type: Oral Tube size: 7.0 mm Number of attempts: 1 Airway Equipment and Method: Stylet Placement Confirmation: ETT inserted through vocal cords under direct vision,  positive ETCO2 and breath sounds checked- equal and bilateral Secured at: 22 cm Tube secured with: Tape Dental Injury: Teeth and Oropharynx as per pre-operative assessment

## 2016-04-02 NOTE — Anesthesia Postprocedure Evaluation (Signed)
Anesthesia Post Note  Patient: Victoria Rodriguez  Procedure(s) Performed: Procedure(s) (LRB): LAPAROSCOPIC TUBAL LIGATION (Bilateral)  Patient location during evaluation: PACU Anesthesia Type: General Level of consciousness: sedated Pain management: pain level controlled Vital Signs Assessment: post-procedure vital signs reviewed and stable Respiratory status: spontaneous breathing and respiratory function stable Cardiovascular status: stable Anesthetic complications: no     Last Vitals:  Vitals:   04/02/16 1045 04/02/16 1153  BP: 135/86 132/79  Pulse: 86 86  Resp: 16 14  Temp: 36.8 C     Last Pain:  Vitals:   04/02/16 1200  TempSrc:   PainSc: 2    Pain Goal: Patients Stated Pain Goal: 3 (04/02/16 0826)               Duane Boston DANIEL

## 2016-04-02 NOTE — Progress Notes (Signed)
At 11:20 patient getting dressed, ready to go home.  Heard patient violently vomiting in restroom.  Entered restroom to find patient hovered over toilet actively vomiting clear fluid with scant amount of brown food particles.  Assisted patient to recliner and began infusing LR through Left PIV.  Phenergan given per order.  Family notified.  Will let patient rest in phase 2 and reassess.

## 2016-04-02 NOTE — Op Note (Signed)
Victoria Rodriguez 04/02/2016  PREOPERATIVE DIAGNOSIS:  Undesired fertility  POSTOPERATIVE DIAGNOSIS:  Undesired fertility  PROCEDURE:  Laparoscopic Bilateral Tubal Sterilization using Filshie Clips   SURGEON: Woodroe Mode, MD   ANESTHESIA:  General endotracheal  COMPLICATIONS:  None immediate.  ESTIMATED BLOOD LOSS:  Less than 20 ml.  FLUIDS: 1000 ml LR.  URINE OUTPUT:  30 ml of clear urine.  INDICATIONS: 35 y.o. [id number]  with undesired fertility, desires permanent sterilization. Other reversible forms of contraception were discussed with patient; she declines all other modalities.  Risks of procedure discussed with patient including permanence of method, bleeding, infection, injury to surrounding organs and need for additional procedures including laparotomy, risk of regret.  Failure risk of 0.5-1% with increased risk of ectopic gestation if pregnancy occurs was also discussed with patient.      FINDINGS:  Normal uterus, tubes, and ovaries.  TECHNIQUE:  The patient was taken to the operating room where general anesthesia was obtained without difficulty.  She was then placed in the dorsal lithotomy position and prepared and draped in sterile fashion.  After an adequate timeout was performed, a bivalved speculum was then placed in the patient's vagina, and the anterior lip of cervix grasped with the single-tooth tenaculum.  The uterine manipulator was then advanced into the uterus.  The speculum was removed from the vagina.  Attention was then turned to the patient's abdomen where a 11-mm skin incision was made in the umbilical fold. Veress needle was inserted and the abdomen was insufflated with CO2. The 11-mm trocar and sleeve were then advanced without difficulty into the abdomen.   A survey of the patient's pelvis and abdomen revealed entirely normal anatomy.  The fallopian tubes were observed and found to be normal in appearance. The Filshie clip applicator was placed through the  operative port, and a Filshie clip was placed on the right fallopian tube ,about 2 cm from the cornual attachment, with care given to incorporate the underlying mesosalpinx.  A similar process was carried out on the contralateral side allowing for bilateral tubal sterilization.   Good hemostasis was noted overall. The instruments were then removed from the patient's abdomen and the fascial incision was repaired with 0 Vicryl, and the skin was closed with 4-0 Vicryl.  The uterine manipulator and the tenaculum were removed from the vagina without complications. The patient tolerated the procedure well.  Sponge, lap, and needle counts were correct times two.  The patient was then taken to the recovery room awake, extubated and in stable condition.  Woodroe Mode, MD 04/02/2016 9:39 AM

## 2016-04-02 NOTE — H&P (Signed)
Victoria Rodriguez is an 35 y.o. female. [id number] Patient's last menstrual period was 03/03/2016 (exact date).  Victoria Rodriguez is a 35 y.o. female who presents tubal ligationt. She is 8 weeks postpartum following a low cervical transverse Cesarean section. I have fully reviewed the prenatal and intrapartum course. The delivery was at 39.4 gestational weeks. Outcome: primary cesarean section, low vertical incision. Anesthesia: epidural. Postpartum course has been good, incision irritated. Baby's course has been complicated by hospitalization for meconium aspiration. Baby is feeding by bottle -  . Bleeding menstrual bleed. Bowel function is normal. Bladder function is normal. Patient is not sexually active. Contraception method is abstinence and but wants BTL.    Menstrual History:  Patient's last menstrual period was 03/03/2016 (exact date).    Past Medical History:  Diagnosis Date  . Abscess of breast 05/14/2015  . Chlamydia contact, treated   . Hidradenitis suppurativa    surgical  . Microscopic hematuria 03/21/2015   03/20/15 urine micro with 5-10 RBC. Recommend recheck UA & micro at next office visit.     Past Surgical History:  Procedure Laterality Date  . AXILLARY HIDRADENITIS EXCISION  2011   bilateral  . CESAREAN SECTION N/A 01/25/2016   Procedure: CESAREAN SECTION;  Surgeon: Mora Bellman, MD;  Location: [facility];  Service: Obstetrics;  Laterality: N/A;  . DILATION AND CURETTAGE OF UTERUS    . HYDRADENITIS EXCISION Left 02/12/2014   Procedure: EXCISION HIDRADENITIS AXILLA;  Surgeon: Coralie Keens, MD;  Location: [facility];  Service: General;  Laterality: Left;  . IRRIGATION AND DEBRIDEMENT ABSCESS Left 11/16/2013   Procedure: IRRIGATION AND DEBRIDEMENT LEFT AXILLARY ABSCESS;  Surgeon: Pedro Earls, MD;  Location: [facility];  Service: General;  Laterality: Left;    Family History  Problem Relation Age of Onset  . Asthma Mother   . Diabetes  Father   . Asthma Sister   . Asthma Brother   . Cancer Neg Hx   . Heart disease Neg Hx   . Stroke Neg Hx     Social History:  reports that she has never smoked. She has never used smokeless tobacco. She reports that she does not drink alcohol or use drugs.  Allergies:  Allergies  Allergen Reactions  . Vicodin [Hydrocodone-Acetaminophen] Nausea Only    Prescriptions Prior to Admission  Medication Sig Dispense Refill Last Dose  . doxycycline (VIBRA-TABS) 100 MG tablet Take 1 tablet (100 mg total) by mouth 2 (two) times daily. 28 tablet 0 Past Week at Unknown time  . acetaminophen (TYLENOL) 325 MG tablet Take 650 mg by mouth every 6 (six) hours as needed for mild pain, moderate pain or headache.   More than a month at Unknown time    Review of Systems  Constitutional: Negative.   Respiratory: Negative.   Genitourinary: Negative.     Blood pressure (!) 125/91, pulse 78, temperature 98.3 F (36.8 C), temperature source Oral, resp. rate 20, last menstrual period 03/03/2016, SpO2 100 %, not currently breastfeeding. Physical Exam  Vitals reviewed. Constitutional: She appears well-developed. No distress.  Cardiovascular: Normal rate.   Respiratory: Effort normal. No respiratory distress.  Neurological: She is alert.  Skin: Skin is warm.  Psychiatric: She has a normal mood and affect. Her behavior is normal.   CBC    Component Value Date/Time   WBC 5.8 03/23/2016 1535   RBC 4.24 03/23/2016 1535   HGB 11.1 (L) 03/23/2016 1535   HGB 10.5 06/12/2015   HCT 34.4 (L)  03/23/2016 1535   HCT 34 06/12/2015   PLT 374 03/23/2016 1535   PLT 446 06/12/2015   MCV 81.1 03/23/2016 1535   MCH 26.2 03/23/2016 1535   MCHC 32.3 03/23/2016 1535   RDW 15.7 (H) 03/23/2016 1535   LYMPHSABS 2.3 05/23/2015 1723   MONOABS 0.5 05/23/2015 1723   EOSABS 0.1 05/23/2015 1723   BASOSABS 0.0 05/23/2015 1723     Assessment/Plan: 35 y.o. [id number] with undesired fertility desires permanent  sterilization. Risks and benefits of laparoscopic tubal sterilization procedure was discussed with the patient including permanence of method, bleeding, infection, injury to surrounding organs, anesthesia and need for additional procedures. Risk failure of 0.5-1% with increased risk of ectopic gestation if pregnancy occurs was also discussed with patient. Patient verbalized understanding and all questions were answered.   Victoria Rodriguez 04/02/2016, 8:28 AM

## 2016-04-02 NOTE — Transfer of Care (Signed)
Immediate Anesthesia Transfer of Care Note  Patient: Victoria Rodriguez  Procedure(s) Performed: Procedure(s): LAPAROSCOPIC TUBAL LIGATION (Bilateral)  Patient Location: PACU  Anesthesia Type:General  Level of Consciousness: awake and alert   Airway & Oxygen Therapy: Patient Spontanous Breathing and Patient connected to nasal cannula oxygen  Post-op Assessment: Report given to RN and Post -op Vital signs reviewed and stable  Post vital signs: Reviewed  Last Vitals:  Vitals:   04/02/16 0826  BP: (!) 125/91  Pulse: 78  Resp: 20  Temp: 36.8 C    Last Pain:  Vitals:   04/02/16 0826  TempSrc: Oral      Patients Stated Pain Goal: 3 ([id number])  Complications: No apparent anesthesia complications

## 2016-04-05 ENCOUNTER — Encounter (HOSPITAL_COMMUNITY): Payer: Self-pay | Admitting: Obstetrics & Gynecology

## 2016-04-21 ENCOUNTER — Encounter (HOSPITAL_COMMUNITY): Payer: Self-pay

## 2016-04-26 ENCOUNTER — Ambulatory Visit (INDEPENDENT_AMBULATORY_CARE_PROVIDER_SITE_OTHER): Payer: Medicare Other | Admitting: Family Medicine

## 2016-04-26 ENCOUNTER — Encounter: Payer: Self-pay | Admitting: Family Medicine

## 2016-04-26 VITALS — BP 123/76 | HR 83 | Temp 98.3°F | Ht 63.0 in | Wt 220.6 lb

## 2016-04-26 DIAGNOSIS — L732 Hidradenitis suppurativa: Secondary | ICD-10-CM | POA: Diagnosis present

## 2016-04-26 MED ORDER — BENZOYL PEROXIDE 10 % EX LIQD: CUTANEOUS | 4 refills | Status: DC

## 2016-04-26 MED ORDER — DOXYCYCLINE HYCLATE 100 MG PO TABS: 100.0000 mg | ORAL_TABLET | Freq: Two times a day (BID) | ORAL | 0 refills | Status: DC

## 2016-04-26 MED ORDER — NAPROXEN 500 MG PO TABS: 500.0000 mg | ORAL_TABLET | Freq: Two times a day (BID) | ORAL | 0 refills | Status: DC

## 2016-04-26 NOTE — Patient Instructions (Signed)
It appears that you have something called hidradenitis suppurativa.  This is a common skin condition where you are prone to infection/ abscess formation.    What YOU can do to decrease recurrence:  1. Wear loose, light clothing  2. Avoid excessive heat, friction, and shearing trauma (protect areas that rub together: thighs, underneath breasts, underneath belly) 3. Wash clothes in detergents that are free of perfumes & dyes (usually marketed as "free and clear") 4. Wash DAILY. Use a gentle, nonsoap cleanser and to wash gently with only your fingers. Scrubbing with washcloths, loofahs, or brushes causes trauma and irritation. If you feel like you have an odor, you can use an antibacterial soap like Dial. 5. If you smoke, STOP. 6. Weight loss.  This is probably the most important one of all if you are overweight.  Excess weight causes hormonal imbalance, insulin resistance and increased shearing forces on the skin.  These ALL lead to increased risk of having infection. 7. Avoid shaving 8. Avoid deodorant 9. Avoidance of dairy (milk, cheese, yogurt, cream).  Some studies have shown that eliminating dairy from your diet has improved symptoms in as soon as 2 weeks.  Make sure to take a daily multivitamin if you choose to do this. 10. Apply a warm compress/ washcloth to affected area several times daily.  This will help the abscess open up, drain and heal.  What should you do if none of the above is helping?  Come back and see me.  We may need to put you on antibiotics, drain your infection in the office or refer you to a dermatologist.

## 2016-04-26 NOTE — Progress Notes (Signed)
   Subjective: CC: Hidradenitis suppurativa [id number] Victoria Rodriguez is a 35 y.o. female presenting to clinic today for same day appointment. PCP: MCDIARMID,TODD D, MD Concerns today include:  1. Hidradenitis suppurativa Patient reports that she is having a flare on the groin area bilaterally and the right axilla.  She reports that right axilla got better for a short period of time after being on Doxy in October.  She notes that things seem to be getting worse over the last couple of weeks.  NO changes over last couple of weeks to explain.  She reports that she bathes with dial soap.  She does not shave.  She reports she wears loose clothing, using fragrance free detergents.  She does not smoke.  She has not seen a dermatologist for this yet.  She reports that she has surgery several times.  She notes that she was symptom free during pregnancy.  Social History Reviewed: non smoker. FamHx and MedHx reviewed.  Please see EMR. Health Maintenance: Declines flu  ROS: Per HPI  Objective: Office vital signs reviewed. BP 123/76   Pulse 83   Temp 98.3 F (36.8 C) (Oral)   Ht 5\' 3"  (1.6 m)   Wt 220 lb 9.6 oz (100.1 kg)   LMP 04/03/2016 (Exact Date)   BMI 39.08 kg/m   Physical Examination:  General: Awake, alert, obebse, No acute distress Cardio: regular rate Pulm: normal WOB on room air Skin: right axilla with multiple areas of scarring, 1 open (but non draining), indurated area about 0.25cm x0.3 cm. No palpable underlying fluctuance.  Mons pubis with multiple areas of scarring. 1 small indurated area that is open but not draining on the left upper side of the mons.  Again, no underlying fluctuance.  Assessment/ Plan: 35 y.o. female   1. Chronic Recurrent Hidradenitis Suppurativa.  2 open areas that are nondraining and have no underlying fluctuance.  I could find nothing I could I&D today.  Patient appears to be doing most of the things she needs to do in order to prevent lesions.   - Weight  loss recommended - Home skin care instructions provided, see AVS - Discussed using 1/4c bleach in a bathtub full of warm water and soaking for about 10-15 minutes one - two times weekly. - Advised that she should start washing with benzoyl peroxide once every day to every other day for next week or two, then graduating to daily then twice daily as tolerated - Benzoyl Peroxide (BENZOYL PEROXIDE) 10 % LIQD; Wash affected areas twice daily.  Dispense: 227 g; Refill: 4 - Ambulatory referral to Dermatology - naproxen (NAPROSYN) 500 MG tablet; Take 1 tablet (500 mg total) by mouth 2 (two) times daily with a meal.  Dispense: 30 tablet; Refill: 0 - doxycycline (VIBRA-TABS) 100 MG tablet; Take 1 tablet (100 mg total) by mouth 2 (two) times daily.  Dispense: 28 tablet; Refill: 0 - Return precautions reviewed - Follow up with PCP as needed  Janora Norlander, DO PGY-3, [facility]

## 2016-05-15 ENCOUNTER — Emergency Department (HOSPITAL_COMMUNITY): Payer: Medicare Other

## 2016-05-15 ENCOUNTER — Emergency Department (HOSPITAL_COMMUNITY)
Admission: EM | Admit: 2016-05-15 | Discharge: 2016-05-15 | Disposition: A | Discharge status: Home / Self Care | Payer: Medicare Other | Attending: Emergency Medicine | Admitting: Emergency Medicine

## 2016-05-15 ENCOUNTER — Encounter (HOSPITAL_COMMUNITY): Payer: Self-pay | Admitting: *Deleted

## 2016-05-15 DIAGNOSIS — R0789 Other chest pain: Secondary | ICD-10-CM | POA: Diagnosis not present

## 2016-05-15 DIAGNOSIS — Y92410 Unspecified street and highway as the place of occurrence of the external cause: Secondary | ICD-10-CM | POA: Diagnosis not present

## 2016-05-15 DIAGNOSIS — M62830 Muscle spasm of back: Secondary | ICD-10-CM | POA: Diagnosis not present

## 2016-05-15 DIAGNOSIS — S060X0A Concussion without loss of consciousness, initial encounter: Secondary | ICD-10-CM | POA: Insufficient documentation

## 2016-05-15 DIAGNOSIS — S0990XA Unspecified injury of head, initial encounter: Secondary | ICD-10-CM | POA: Diagnosis present

## 2016-05-15 DIAGNOSIS — Y939 Activity, unspecified: Secondary | ICD-10-CM | POA: Insufficient documentation

## 2016-05-15 DIAGNOSIS — M25532 Pain in left wrist: Secondary | ICD-10-CM | POA: Diagnosis not present

## 2016-05-15 DIAGNOSIS — R079 Chest pain, unspecified: Secondary | ICD-10-CM | POA: Diagnosis not present

## 2016-05-15 DIAGNOSIS — Y999 Unspecified external cause status: Secondary | ICD-10-CM | POA: Insufficient documentation

## 2016-05-15 DIAGNOSIS — S63502A Unspecified sprain of left wrist, initial encounter: Secondary | ICD-10-CM | POA: Diagnosis not present

## 2016-05-15 DIAGNOSIS — M549 Dorsalgia, unspecified: Secondary | ICD-10-CM

## 2016-05-15 DIAGNOSIS — S6992XA Unspecified injury of left wrist, hand and finger(s), initial encounter: Secondary | ICD-10-CM | POA: Diagnosis not present

## 2016-05-15 MED ORDER — NAPROXEN 500 MG PO TABS: 500.0000 mg | ORAL_TABLET | Freq: Two times a day (BID) | ORAL | 0 refills | Status: DC | PRN

## 2016-05-15 MED ORDER — NAPROXEN 250 MG PO TABS
500.0000 mg | ORAL_TABLET | Freq: Once | ORAL | Status: AC
  Administered 2016-05-15: 500 mg via ORAL
  Filled 2016-05-15: qty 2

## 2016-05-15 MED ORDER — CYCLOBENZAPRINE HCL 10 MG PO TABS: 10.0000 mg | ORAL_TABLET | Freq: Three times a day (TID) | ORAL | 0 refills | Status: DC | PRN

## 2016-05-15 NOTE — ED Notes (Signed)
Pt ambulated with Hassan Rowan (RN) from triage to TR06, waiting for nurse.
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
## 2016-05-15 NOTE — Discharge Instructions (Signed)
Take naprosyn as directed for inflammation and pain with tylenol for breakthrough pain and flexeril for muscle relaxation. Do not drive or operate machinery with muscle relaxant use. Ice to areas of soreness for the next 24 hours and then may move to heat, no more than 20 minutes at a time every hour for each. Wear wrist splint as needed for comfort, ice and elevate your wrist to help with pain/swelling. Expect to be sore for the next few days and follow up with primary care physician for recheck of ongoing symptoms in the next 1 week. Return to ER for emergent changing or worsening of symptoms.    For your mild concussion: Use Ibuprofen or Tylenol for pain. Get plenty of rest, use ice on your head.  Stay in a quiet, not simulating, dark environment. No TV, computer use, video games, or cell phone use until headache is resolved completely. Follow Up with primary care physician in 1 week for recheck of symptoms.  Return to the emergency department if patient becomes lethargic, begins vomiting or other change in mental status, or any other changes/worsening symptoms.

## 2016-05-15 NOTE — ED Notes (Signed)
Declined W/C at D/C and was escorted to lobby by RN. 

## 2016-06-16 ENCOUNTER — Encounter (HOSPITAL_COMMUNITY): Payer: Self-pay | Admitting: *Deleted

## 2016-06-16 ENCOUNTER — Emergency Department (HOSPITAL_COMMUNITY)
Admission: EM | Admit: 2016-06-16 | Discharge: 2016-06-16 | Disposition: A | Discharge status: Home / Self Care | Payer: Medicare Other | Attending: Emergency Medicine | Admitting: Emergency Medicine

## 2016-06-16 DIAGNOSIS — L0291 Cutaneous abscess, unspecified: Secondary | ICD-10-CM

## 2016-06-16 DIAGNOSIS — L02811 Cutaneous abscess of head [any part, except face]: Secondary | ICD-10-CM | POA: Insufficient documentation

## 2016-06-16 MED ORDER — DOXYCYCLINE HYCLATE 100 MG PO CAPS: 100.0000 mg | ORAL_CAPSULE | Freq: Two times a day (BID) | ORAL | 0 refills | Status: DC

## 2016-06-16 MED ORDER — LIDOCAINE HCL (PF) 1 % IJ SOLN
5.0000 mL | Freq: Once | INTRAMUSCULAR | Status: AC
  Administered 2016-06-16: 5 mL
  Filled 2016-06-16: qty 5

## 2016-06-16 NOTE — ED Triage Notes (Addendum)
Pt states a swollen and painful knot to the back of her head/base of skull x 1 week, that has gotten larger since yesterday.  Pt would also like to have the hydradenitis to her R axilla looked at while she is here.

## 2016-06-16 NOTE — ED Provider Notes (Signed)
[facility] DEPT Provider Note   CSN: [id number] Arrival date & time: 06/16/16  0818     History   Chief Complaint Chief Complaint  Patient presents with  . Abscess    HPI Victoria Rodriguez is a 36 y.o. female.  HPI here for evaluation of a knot on the back of her head. Patient reports she had a small bump on the back of her left head that over the past week has gradually gotten larger and more tender. She is apply hot compresses with minimal relief, no other remedies tried. She denies fevers, chills, headache, overt neck pain. She also reports she has a history of hidradenitis under both of her arms and it is flaring up, has associated drainage and tenderness.  Past Medical History:  Diagnosis Date  . Abscess of breast 05/14/2015  . Chlamydia contact, treated   . Hidradenitis suppurativa    surgical  . Microscopic hematuria 03/21/2015   03/20/15 urine micro with 5-10 RBC. Recommend recheck UA & micro at next office visit.     Patient Active Problem List   Diagnosis Date Noted  . BPPV (benign paroxysmal positional vertigo) 02/16/2016  . BMI 40.0-44.9, adult ([place]) 12/23/2015  . Chronic Recurrent Hidradenitis Suppurativa 04/18/2012  . Keratosis punctata 02/29/2012    Past Surgical History:  Procedure Laterality Date  . AXILLARY HIDRADENITIS EXCISION  2011   bilateral  . CESAREAN SECTION N/A 01/25/2016   Procedure: CESAREAN SECTION;  Surgeon: Mora Bellman, MD;  Location: [facility];  Service: Obstetrics;  Laterality: N/A;  . DILATION AND CURETTAGE OF UTERUS    . HYDRADENITIS EXCISION Left 02/12/2014   Procedure: EXCISION HIDRADENITIS AXILLA;  Surgeon: Coralie Keens, MD;  Location: [facility];  Service: General;  Laterality: Left;  . IRRIGATION AND DEBRIDEMENT ABSCESS Left 11/16/2013   Procedure: IRRIGATION AND DEBRIDEMENT LEFT AXILLARY ABSCESS;  Surgeon: Pedro Earls, MD;  Location: [facility];  Service: General;  Laterality: Left;  .  LAPAROSCOPIC TUBAL LIGATION Bilateral 04/02/2016   Procedure: LAPAROSCOPIC TUBAL LIGATION;  Surgeon: Woodroe Mode, MD;  Location: [facility];  Service: Gynecology;  Laterality: Bilateral;    OB History    Gravida Para Term Preterm AB Living   5 2 2  0 3 2   SAB TAB Ectopic Multiple Live Births   1 2   0 1      Obstetric Comments   2000: 6lbs 9oz TSVD       Home Medications    Prior to Admission medications   Medication Sig Start Date End Date Taking? Authorizing Provider  Benzoyl Peroxide (BENZOYL PEROXIDE) 10 % LIQD Wash affected areas twice daily. 04/26/16   Ashly Windell Moulding, DO  cyclobenzaprine (FLEXERIL) 10 MG tablet Take 1 tablet (10 mg total) by mouth 3 (three) times daily as needed for muscle spasms. 05/15/16   [address], PA-C  doxycycline (VIBRAMYCIN) 100 MG capsule Take 1 capsule (100 mg total) by mouth 2 (two) times daily. One po bid x 7 days 06/16/16   Comer Locket, PA-C  naproxen (NAPROSYN) 500 MG tablet Take 1 tablet (500 mg total) by mouth 2 (two) times daily with a meal. 04/26/16   Ashly Windell Moulding, DO  naproxen (NAPROSYN) 500 MG tablet Take 1 tablet (500 mg total) by mouth 2 (two) times daily as needed for mild pain, moderate pain or headache (TAKE WITH MEALS.). 05/15/16   [place], PA-C  traMADol-acetaminophen (ULTRACET) 37.5-325 MG tablet Take 1-2 tablets by mouth every  6 (six) hours as needed. 04/02/16   Woodroe Mode, MD    Family History Family History  Problem Relation Age of Onset  . Asthma Mother   . Diabetes Father   . Asthma Sister   . Asthma Brother   . Cancer Neg Hx   . Heart disease Neg Hx   . Stroke Neg Hx     Social History Social History  Substance Use Topics  . Smoking status: Never Smoker  . Smokeless tobacco: Never Used  . Alcohol use No     Comment: drinks monthly or less.when not pregnant      Allergies   Vicodin [hydrocodone-acetaminophen]   Review of Systems Review of Systems See history of  present illness  Physical Exam Updated Vital Signs BP 139/89 (BP Location: Right Arm)   Pulse 86   Temp 98.4 F (36.9 C) (Oral)   Resp 16   Ht 5\' 3"  (1.6 m)   Wt 101.2 kg   LMP 05/23/2016   SpO2 99%   BMI 39.50 kg/m   Physical Exam  Constitutional: She appears well-developed. No distress.  Awake, alert and nontoxic in appearance  HENT:  Head: Normocephalic and atraumatic.  Right Ear: External ear normal.  Left Ear: External ear normal.  Mouth/Throat: Oropharynx is clear and moist.  Left occiput: Area of roughly 2-3 cm of circumferential induration, tenderness to palpation. No surrounding cellulitis.  Eyes: Conjunctivae and EOM are normal. Pupils are equal, round, and reactive to light.  Neck: Normal range of motion. No JVD present.  Cardiovascular: Normal rate, regular rhythm and normal heart sounds.   Pulmonary/Chest: Effort normal and breath sounds normal. No stridor.  Abdominal: Soft. There is no tenderness.  Musculoskeletal: Normal range of motion.  Hidradenitis supporativa under bilateral axilla, some are spontaneously draining. Mom is running cellulitis.  Neurological:  Awake, alert, cooperative and aware of situation; motor strength bilaterally; sensation normal to light touch bilaterally; no facial asymmetry; tongue midline; major cranial nerves appear intact;  baseline gait without new ataxia.  Skin: No rash noted. She is not diaphoretic.  Psychiatric: She has a normal mood and affect. Her behavior is normal. Thought content normal.  Nursing note and vitals reviewed.  INCISION AND DRAINAGE Performed by: Verl Dicker Consent: Verbal consent obtained. Risks and benefits: risks, benefits and alternatives were discussed Type: abscess  Body area: Left occiput  Anesthesia: local infiltration  Incision was made with a scalpel.  Local anesthetic: lidocaine 1 % with epinephrine  Anesthetic total: 2 ml  Complexity: complex Blunt dissection to break up  loculations  Drainage: purulent  Drainage amount: Scant   Packing material: No packing   Patient tolerance: Patient tolerated the procedure well with no immediate complications.     ED Treatments / Results  Labs (all labs ordered are listed, but only abnormal results are displayed) Labs Reviewed - No data to display  EKG  EKG Interpretation None       Radiology No results found.  Procedures Procedures (including critical care time)  Medications Ordered in ED Medications  lidocaine (PF) (XYLOCAINE) 1 % injection 5 mL (5 mLs Infiltration Given 06/16/16 0847)     Initial Impression / Assessment and Plan / ED Course  I have reviewed the triage vital signs and the nursing notes.  Pertinent labs & imaging results that were available during my care of the patient were reviewed by me and considered in my medical decision making (see chart for details).  Clinical Course  Patient with abscess to left occiput with minimal drainage after I and D. Also has hidradenitis with active drainage and cellulitis. Started on doxycycline. Also discussed symptomatic support at home To follow up with PCP. Return precautions discussed  Final Clinical Impressions(s) / ED Diagnoses   Final diagnoses:  Abscess    New Prescriptions New Prescriptions   DOXYCYCLINE (VIBRAMYCIN) 100 MG CAPSULE    Take 1 capsule (100 mg total) by mouth 2 (two) times daily. One po bid x 7 days     Comer Locket, PA-C 06/16/16 [place], MD 06/24/16 [phone]

## 2016-06-16 NOTE — Discharge Planning (Signed)
Pt up for discharge. [facility] reviewed chart for possible CM needs.  No needs identified or communicated.  Patient with abscess to left occiput with minimal drainage after I and D. Also has hidradenitis with active drainage and cellulitis. Started on doxycycline. Also discussed symptomatic support at home To follow up with PCP Sherren Mocha McDiarmid.

## 2016-06-16 NOTE — Discharge Instructions (Signed)
Please take all of your antibiotics as prescribed. Follow-up with your doctor for reevaluation. Return to ED for new or worsening symptoms as we discussed.

## 2016-07-14 ENCOUNTER — Ambulatory Visit: Payer: Medicare Other

## 2016-07-15 ENCOUNTER — Encounter: Payer: Self-pay | Admitting: Family Medicine

## 2016-07-15 ENCOUNTER — Other Ambulatory Visit (HOSPITAL_COMMUNITY)
Admission: RE | Admit: 2016-07-15 | Discharge: 2016-07-15 | Disposition: A | Discharge status: Home / Self Care | Payer: Medicare Other | Source: Ambulatory Visit | Attending: Family Medicine | Admitting: Family Medicine

## 2016-07-15 ENCOUNTER — Ambulatory Visit (INDEPENDENT_AMBULATORY_CARE_PROVIDER_SITE_OTHER): Payer: Medicare Other | Admitting: Family Medicine

## 2016-07-15 VITALS — BP 122/60 | HR 90 | Temp 99.4°F | Ht 63.0 in | Wt 221.0 lb

## 2016-07-15 DIAGNOSIS — Z1151 Encounter for screening for human papillomavirus (HPV): Secondary | ICD-10-CM | POA: Diagnosis not present

## 2016-07-15 DIAGNOSIS — Z124 Encounter for screening for malignant neoplasm of cervix: Secondary | ICD-10-CM | POA: Diagnosis not present

## 2016-07-15 DIAGNOSIS — Z0001 Encounter for general adult medical examination with abnormal findings: Secondary | ICD-10-CM

## 2016-07-15 DIAGNOSIS — Z6841 Body Mass Index (BMI) 40.0 and over, adult: Secondary | ICD-10-CM | POA: Diagnosis not present

## 2016-07-15 DIAGNOSIS — Z01419 Encounter for gynecological examination (general) (routine) without abnormal findings: Secondary | ICD-10-CM | POA: Diagnosis not present

## 2016-07-15 NOTE — Progress Notes (Signed)
   Subjective:     Victoria Rodriguez is a 36 y.o. female and is here for a comprehensive physical exam. The patient reports problems - recurrent hidradenitis. Previously tried on benzoyl peroxide and doxycycline and nothing has worked. Even has had surgery.  Social History   Social History  . Marital status: Single    Spouse name: N/A  . Number of children: N/A  . Years of education: N/A   Occupational History  . Not on file.   Social History Main Topics  . Smoking status: Never Smoker  . Smokeless tobacco: Never Used  . Alcohol use No     Comment: drinks monthly or less.when not pregnant   . Drug use: No  . Sexual activity: Not Currently    Birth control/ protection: None, Abstinence   Other Topics Concern  . Not on file   Social History Narrative   Lives with 52 yo son.  Unemployed. Not in a relationship.   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 PAP SMEAR  06/09/2015  . INFLUENZA VACCINE  08/28/2016 (Originally 12/30/2015)  . TETANUS/TDAP  12/08/2025  . HIV Screening  Completed    The following portions of the patient's history were reviewed and updated as appropriate: allergies, current medications, past family history, past medical history, past social history, past surgical history and problem list.  Review of Systems Pertinent items noted in HPI and remainder of comprehensive ROS otherwise negative.   Objective:    BP 122/60   Pulse 90   Temp 99.4 F (37.4 C) (Oral)   Ht 5\' 3"  (1.6 m)   Wt 221 lb (100.2 kg)   LMP 06/27/2016 (Approximate)   SpO2 99%   Breastfeeding? No   BMI 39.15 kg/m  General appearance: alert, cooperative and appears stated age Head: Normocephalic, without obvious abnormality, atraumatic Neck: no adenopathy, supple, symmetrical, trachea midline and thyroid not enlarged, symmetric, no tenderness/mass/nodules Lungs: clear to auscultation bilaterally Breasts: normal  appearance, no masses or tenderness Heart: regular rate and rhythm, S1, S2 normal, no murmur, click, rub or gallop Abdomen: soft, non-tender; bowel sounds normal; no masses,  no organomegaly Pelvic: cervix normal in appearance, external genitalia normal, no adnexal masses or tenderness, no cervical motion tenderness, uterus normal size, shape, and consistency and vagina normal without discharge Extremities: extremities normal, atraumatic, no cyanosis or edema Pulses: 2+ and symmetric Skin: numerous ulcers in various stages of healing Lymph nodes: Cervical, supraclavicular, and axillary nodes normal. Neurologic: Grossly normal    Assessment:    Healthy female exam.      Plan:      Problem List Items Addressed This Visit      Unprioritized   BMI 40.0-44.9, adult ([place])    Other Visit Diagnoses    Encounter for general adult medical examination with abnormal findings    -  Primary   Relevant Orders   Comprehensive metabolic panel   Screening for cervical cancer       Relevant Orders   Cytology - PAP    declines flu PPD placement on Monday  See After Visit Summary for Counseling Recommendations

## 2016-07-15 NOTE — Patient Instructions (Signed)

## 2016-07-19 ENCOUNTER — Ambulatory Visit (INDEPENDENT_AMBULATORY_CARE_PROVIDER_SITE_OTHER): Payer: Medicare Other | Admitting: *Deleted

## 2016-07-19 ENCOUNTER — Other Ambulatory Visit: Payer: Medicare Other

## 2016-07-19 DIAGNOSIS — Z111 Encounter for screening for respiratory tuberculosis: Secondary | ICD-10-CM | POA: Diagnosis present

## 2016-07-19 DIAGNOSIS — Z0001 Encounter for general adult medical examination with abnormal findings: Secondary | ICD-10-CM | POA: Diagnosis not present

## 2016-07-19 LAB — COMPREHENSIVE METABOLIC PANEL
ALK PHOS: 76 U/L (ref 33–115)
ALT: 9 U/L (ref 6–29)
AST: 13 U/L (ref 10–30)
Albumin: 4.2 g/dL (ref 3.6–5.1)
BUN: 7 mg/dL (ref 7–25)
CHLORIDE: 104 mmol/L (ref 98–110)
CO2: 25 mmol/L (ref 20–31)
CREATININE: 0.61 mg/dL (ref 0.50–1.10)
Calcium: 9.4 mg/dL (ref 8.6–10.2)
GLUCOSE: 89 mg/dL (ref 65–99)
POTASSIUM: 4.1 mmol/L (ref 3.5–5.3)
SODIUM: 138 mmol/L (ref 135–146)
Total Bilirubin: 0.3 mg/dL (ref 0.2–1.2)
Total Protein: 7.8 g/dL (ref 6.1–8.1)

## 2016-07-19 NOTE — Progress Notes (Signed)
   PPD placed Left Forearm.  Pt to return 07/21/16 for reading.  Pt tolerated intradermal injection. Derl Barrow, RN

## 2016-07-20 LAB — CYTOLOGY - PAP
Diagnosis: NEGATIVE
HPV (WINDOPATH): NOT DETECTED

## 2016-07-21 ENCOUNTER — Telehealth: Payer: Self-pay | Admitting: Family Medicine

## 2016-07-21 ENCOUNTER — Ambulatory Visit (INDEPENDENT_AMBULATORY_CARE_PROVIDER_SITE_OTHER): Payer: Medicare Other | Admitting: *Deleted

## 2016-07-21 DIAGNOSIS — Z111 Encounter for screening for respiratory tuberculosis: Secondary | ICD-10-CM

## 2016-07-21 LAB — TB SKIN TEST
Induration: 0 mm
TB Skin Test: NEGATIVE

## 2016-07-21 NOTE — Progress Notes (Signed)
   PPD Reading Note PPD read and results entered in EpicCare. Result: 0 mm induration. Interpretation: Negative If test not read within 48-72 hours of initial placement, patient advised to repeat in other arm 1-3 weeks after this test. Allergic reaction: no  Audri Kozub L, RN  

## 2016-07-21 NOTE — Telephone Encounter (Signed)
Staff medical report for employer form dropped off for at front desk for completion.  Verified that patient section of form has been completed.  Last DOS  was 04/26/16.  Placed form in blue  team folder to be completed by clinical staff.  Carmina Miller

## 2016-07-21 NOTE — Telephone Encounter (Signed)
Clinical info completed on employment form.  Place form in Dr. McDiarmid's box for completion.  Victoria Rodriguez, [name]

## 2016-07-27 NOTE — Telephone Encounter (Signed)
I do not think I have seen this form of Ms Derogatis. Where else could it be?

## 2016-07-27 NOTE — Telephone Encounter (Signed)
Patient informed that physical form is complete and ready for pick up.  Derl Barrow, RN

## 2016-08-30 ENCOUNTER — Encounter: Payer: Self-pay | Admitting: Student

## 2016-08-30 ENCOUNTER — Ambulatory Visit (INDEPENDENT_AMBULATORY_CARE_PROVIDER_SITE_OTHER): Payer: Medicare Other | Admitting: Student

## 2016-08-30 DIAGNOSIS — L732 Hidradenitis suppurativa: Secondary | ICD-10-CM | POA: Diagnosis present

## 2016-08-30 MED ORDER — DOXYCYCLINE HYCLATE 100 MG PO TABS: 100.0000 mg | ORAL_TABLET | Freq: Two times a day (BID) | ORAL | 0 refills | Status: DC

## 2016-08-30 NOTE — Progress Notes (Signed)
   Subjective:    Patient ID: Victoria Rodriguez, female    DOB: July 29, 1980, 36 y.o.   MRN: [id number]   CC: boils on vagina  HPI: 36 y/o F with PMH of hydradenitis suppurativa presenst for concerns of boils near her vagina  Boils near vagina - feels this is consistent with past hydradenitis outbreaks - has noted these symptoms for the last 3 days - no drainage - no fevers - no vaginal pain or discharge  Smoking status reviewed  Review of Systems  Per HPI, else denies chest pain, shortness of breath,    Objective:  BP 124/64   Pulse 75   Temp 98.5 F (36.9 C) (Oral)   Ht 5\' 3"  (1.6 m)   Wt 219 lb (99.3 kg)   LMP 08/09/2016 (Approximate)   SpO2 97%   BMI 38.79 kg/m  Vitals and nursing note reviewed  General: NAD Cardiac: RRR,  Respiratory: CTAB, normal effort Neuro: alert and oriented, no focal deficits Pelvic: erythematous lesions over mons and labia majora, no drainable pockets, else normal external genitalia   Assessment & Plan:    Chronic Recurrent Hidradenitis Suppurativa Rash on Mons consistent with hydradenitis - doxycycline x 14 days and will follow up in 2 weeks to assess for need of continuation of therapy - she has had surgery in past for her axilla, consider surgical options for vulva as this seems to be recurrent    Elbia Paro A. Lincoln Brigham MD, [facility] Resident PGY-3 Pager [phone]

## 2016-08-30 NOTE — Assessment & Plan Note (Signed)
Rash on Mons consistent with hydradenitis - doxycycline x 14 days and will follow up in 2 weeks to assess for need of continuation of therapy - she has had surgery in past for her axilla, consider surgical options for vulva as this seems to be recurrent

## 2016-08-30 NOTE — Patient Instructions (Signed)
Follow up in 2 weeks for hydradenitis rash If you feel the rash is getting worse, follow up sooner If you have questions or concerns, call the office at [phone]

## 2016-09-08 IMAGING — CR DG CHEST 2V
2 series · 2 of 2 positions shown · non-contrast
Comparison: August 25, 2009

CLINICAL DATA: Chills and diarrhea

EXAM:
CHEST  2 VIEW

[w chest pa]
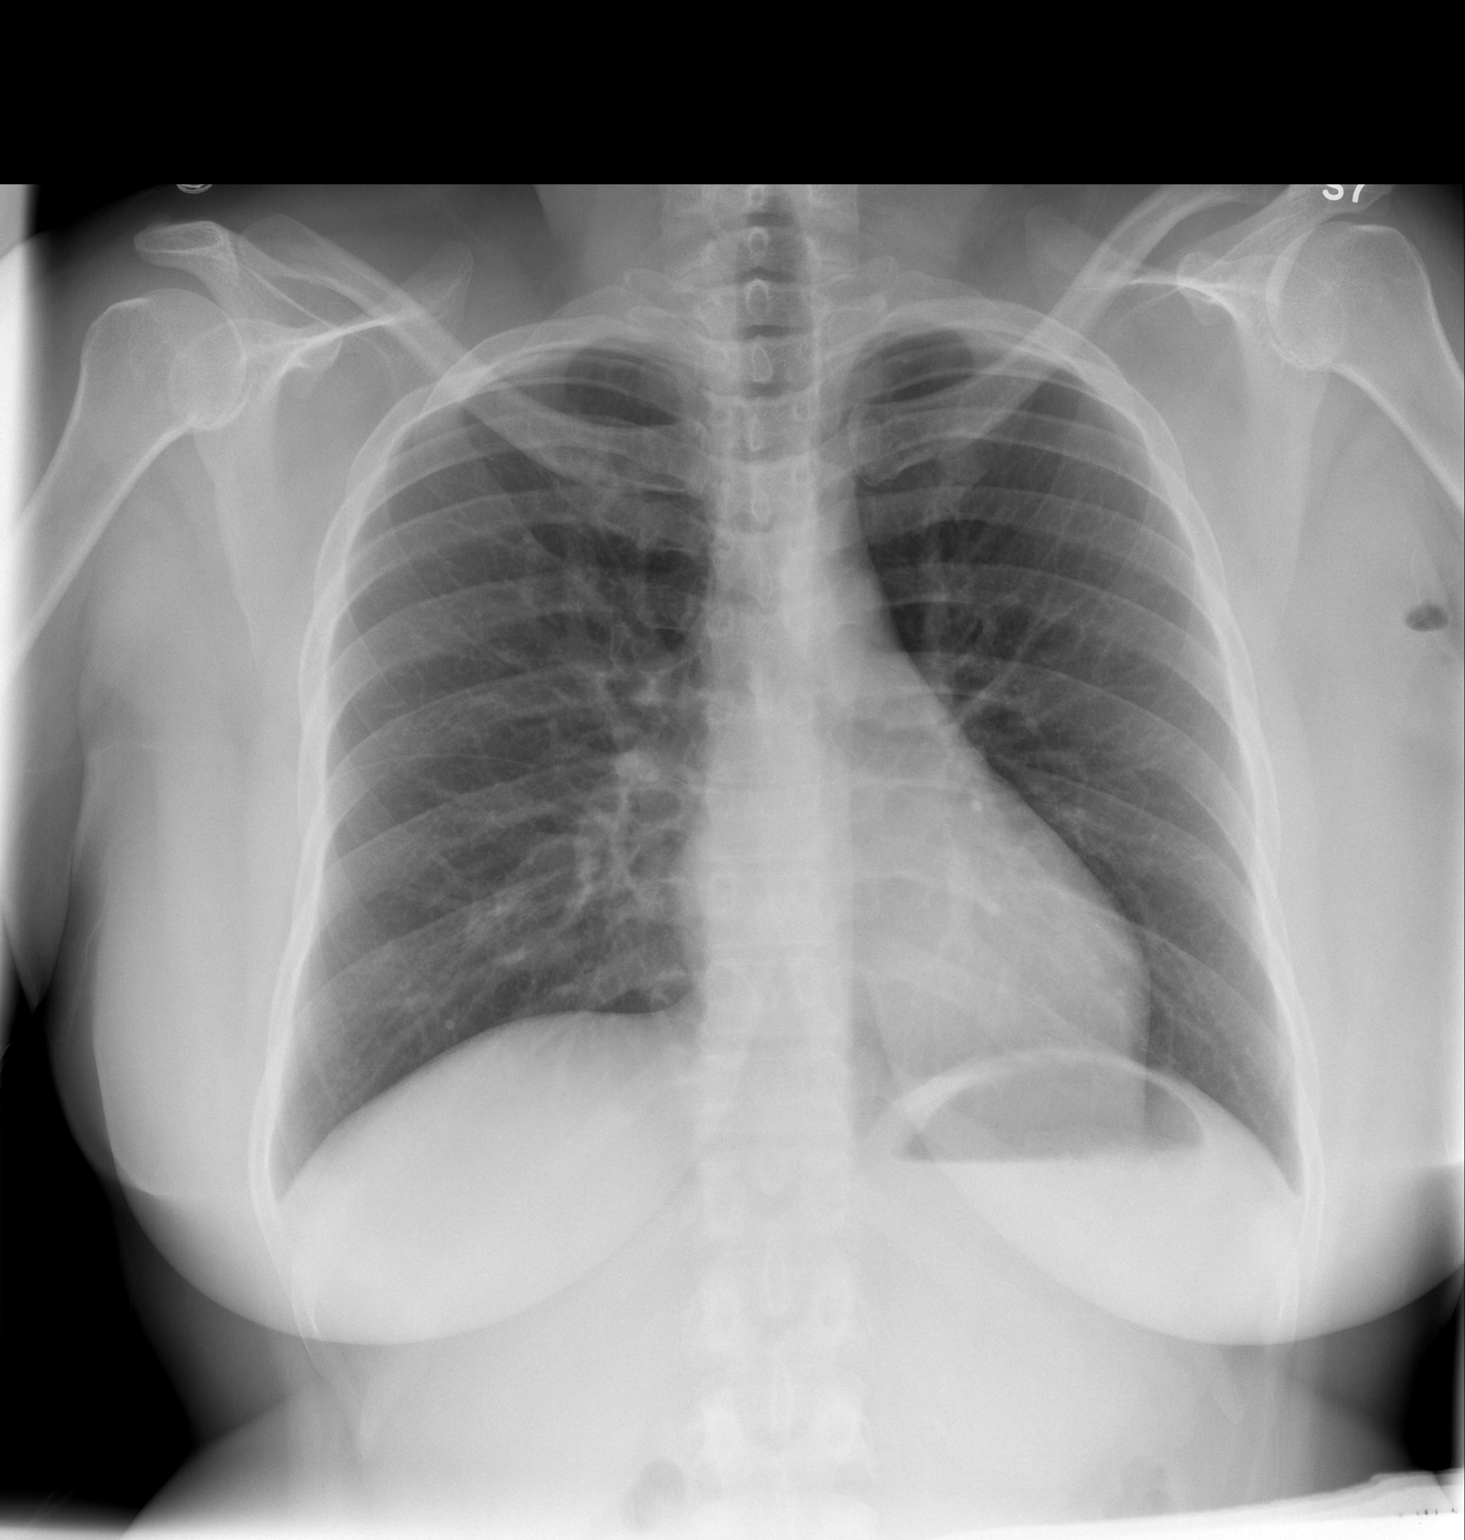

[w chest lat]
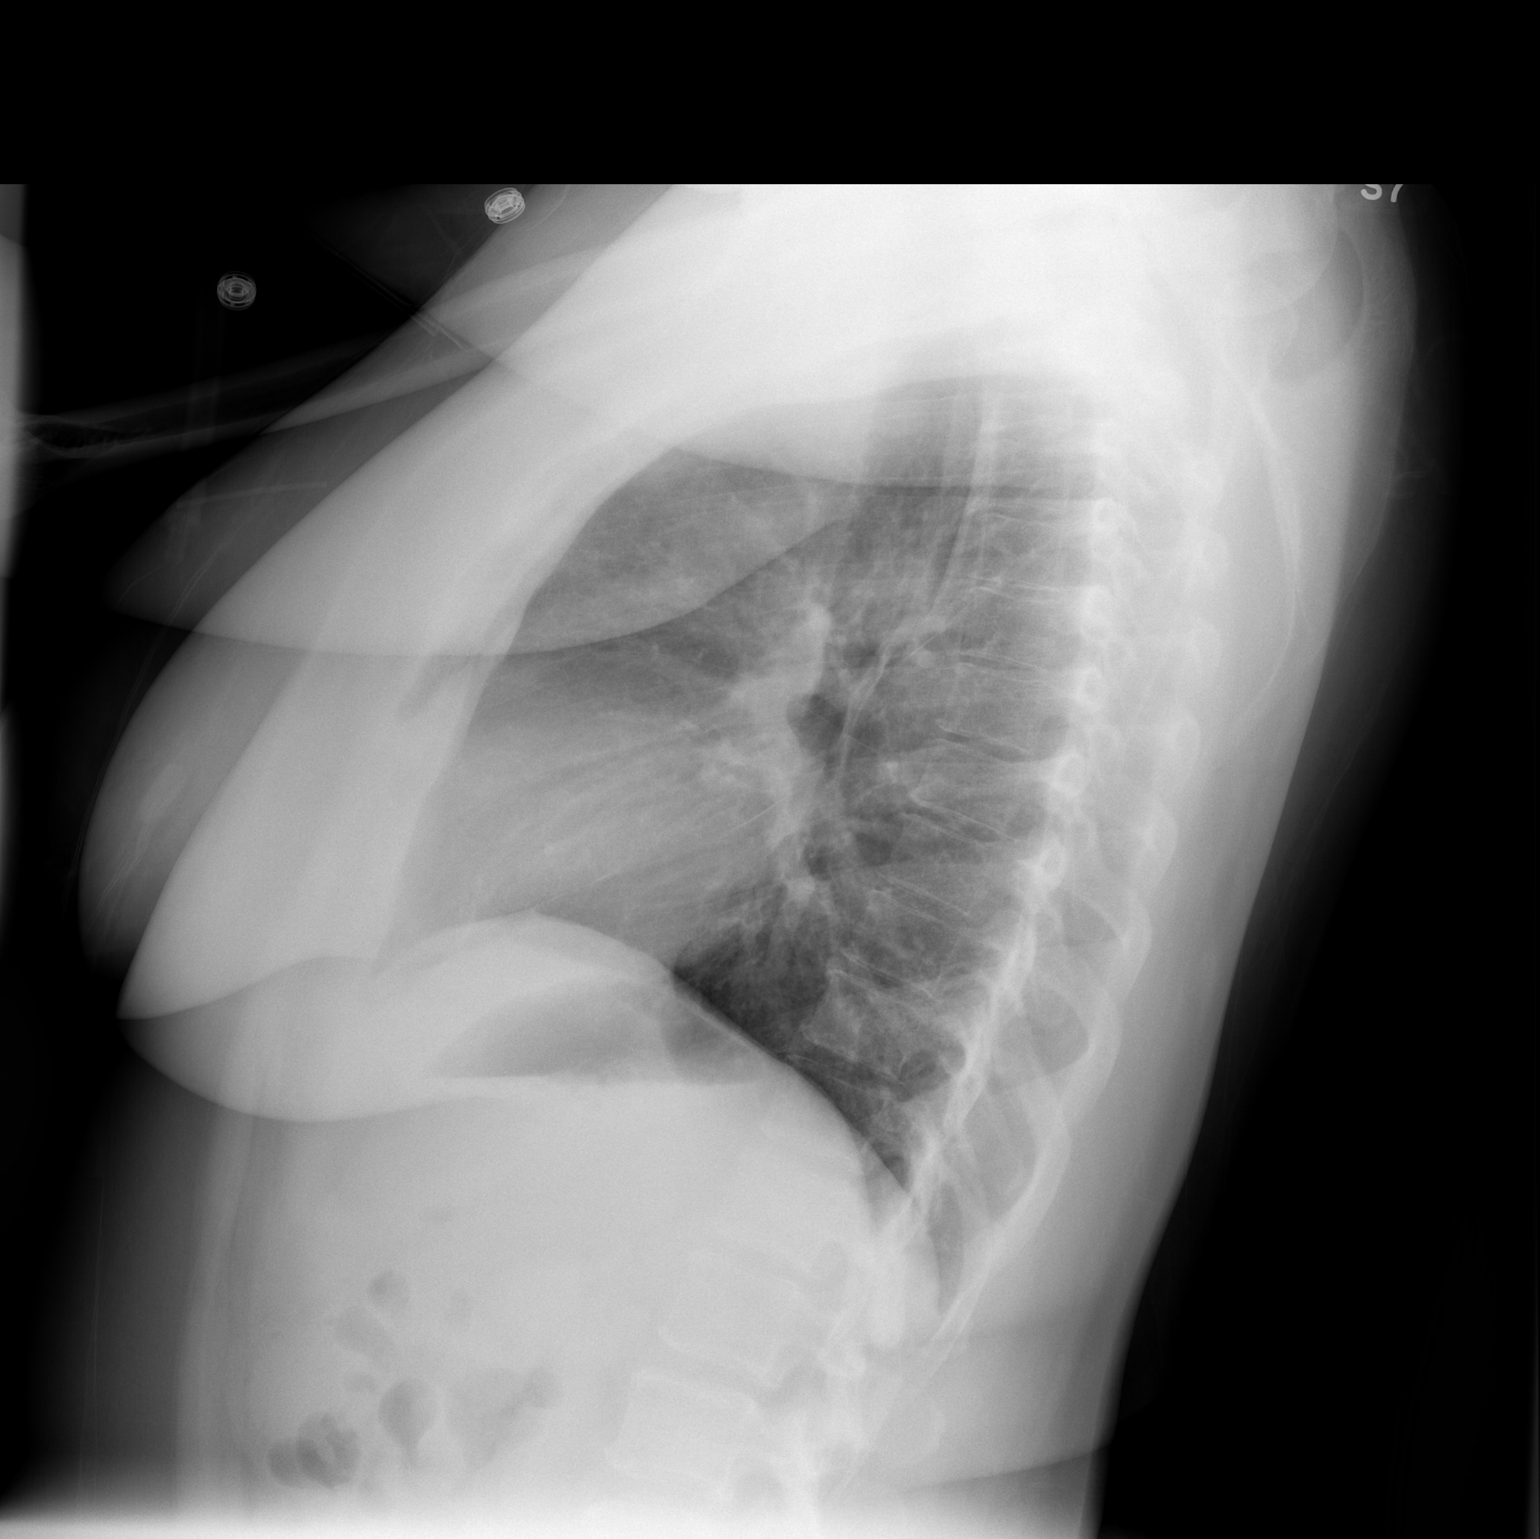

[2 of 2 positions shown; findings below may reference images not displayed]

FINDINGS: Lungs are clear. Heart size and pulmonary vascularity are normal. No
adenopathy. No pneumothorax. No bone lesions.
IMPRESSION: No edema or consolidation.

## 2016-11-10 ENCOUNTER — Encounter: Payer: Self-pay | Admitting: Student

## 2016-11-10 ENCOUNTER — Ambulatory Visit (INDEPENDENT_AMBULATORY_CARE_PROVIDER_SITE_OTHER): Payer: Medicare Other | Admitting: Student

## 2016-11-10 DIAGNOSIS — L732 Hidradenitis suppurativa: Secondary | ICD-10-CM | POA: Diagnosis present

## 2016-11-10 MED ORDER — DOXYCYCLINE HYCLATE 100 MG PO TABS: 100.0000 mg | ORAL_TABLET | Freq: Two times a day (BID) | ORAL | 0 refills | Status: AC

## 2016-11-10 NOTE — Patient Instructions (Signed)
Follow up in 2 weeks for  Hydradenitis You may need to be on a prolonged course of antibiotics Please touch base with your surgeon If you have any questions or concerns, call the office at [phone]

## 2016-11-10 NOTE — Progress Notes (Signed)
   Subjective:    Patient ID: Victoria Rodriguez, female    DOB: 13-May-1981, 36 y.o.   MRN: [id number]   CC: right axillary hydradenitis  HPI: 36 y/o F with PMH of bilateral axillary hydradenitis presents for a flare up of hydradenitis  Right axilary hidradenitis - feels it has started to get worse 1-2 moths ago - no fevers - she has a history of surgery for bilateral axillary hydradenitis and was told she would have to go back to the OR - she denies fever or drainage - just has been painful  Smoking status reviewed  Review of Systems  Per HPI, else denies recent illness, fever, chest pain, shortness of breath    Objective:  BP 102/80   Pulse 71   Temp 98.8 F (37.1 C) (Oral)   Wt 218 lb (98.9 kg)   LMP 11/06/2016   SpO2 99%   BMI 38.62 kg/m  Vitals and nursing note reviewed  General: NAD Cardiac: RRR Respiratory: CTAB, normal effort Extremities: no edema or cyanosis. WWP. Skin: right axilla with multiple open sores each approx 0.5 cm in diameter, no drainable abscess,  warm and dry, no rashes noted Neuro: alert and oriented, no focal deficits   Assessment & Plan:    Chronic Recurrent Hidradenitis Suppurativa Will start longer course of doxycycline, one month for now. Follow up in 2 weeks  - recommend repeat surgical procedure on axilla to help reduce incidence of reccurence    Horace Lukas A. Lincoln Brigham MD, [facility] Resident PGY-3 Pager [phone]

## 2016-11-11 NOTE — Assessment & Plan Note (Signed)
Will start longer course of doxycycline, one month for now. Follow up in 2 weeks  - recommend repeat surgical procedure on axilla to help reduce incidence of reccurence

## 2016-12-27 ENCOUNTER — Ambulatory Visit (INDEPENDENT_AMBULATORY_CARE_PROVIDER_SITE_OTHER): Payer: Medicare Other | Admitting: Family Medicine

## 2016-12-27 ENCOUNTER — Encounter: Payer: Self-pay | Admitting: Family Medicine

## 2016-12-27 DIAGNOSIS — L732 Hidradenitis suppurativa: Secondary | ICD-10-CM

## 2016-12-27 MED ORDER — CLINDAMYCIN PHOSPHATE 1 % EX GEL: Freq: Two times a day (BID) | CUTANEOUS | 0 refills | Status: DC

## 2016-12-27 MED ORDER — DOXYCYCLINE HYCLATE 100 MG PO TABS: 100.0000 mg | ORAL_TABLET | Freq: Two times a day (BID) | ORAL | 0 refills | Status: DC

## 2016-12-27 NOTE — Progress Notes (Signed)
    Subjective:  Victoria Rodriguez is a 36 y.o. female who presents to the [facility] today with a chief complaint of R underarm pain/drainage  HPI:  R underarm pain/drainage - Has chronic hidradenitis with multiple surgeries. L underarm has been under good control, only occasionally drains but R underarm area has been worse lately. - Having increased pain and drainage with foul odor over the past 2-3 weeks.  - Was seen in [facility] on 11/10/16 and started on doxycycline but patient states has not noticed any improvement - Follows with [facility], has appointment on 02/03/17. - Had a referral with [facility] in the past but never went to the appointment because she had many other doctors appointments at that time which was overwhelming - No fever/chills, n/v/d.  - No rashes    ROS: Per HPI  Objective:  Physical Exam: BP 110/66   Pulse 74   Temp 98.6 F (37 C) (Oral)   Ht 5\' 3"  (1.6 m)   Wt 211 lb (95.7 kg)   SpO2 99%   BMI 37.38 kg/m   Gen: NAD, resting comfortably CV: RRR with no murmurs appreciated Skin: R axilla with multiple small areas of abscesses some of which are open and draining purulent fluid associated with mild erythema and edema. No big fluid pocket to drain. L axilla has many areas of scar tissues. Neuro: grossly normal, moves all extremities Psych: Normal affect and thought content   Assessment/Plan:  Chronic Recurrent Hidradenitis Suppurativa Continue doxycycline until patient can see [place], called over and appointment cannot be moved up but patient is on waitlist if any earlier cancellations. Will add topical clindamycin as well. Advised OTC tylenol and ibuprofen for pain. Warm compresses for supportive care.   Bufford Lope, DO PGY-2, [facility] 12/27/2016 4:40 PM

## 2016-12-27 NOTE — Patient Instructions (Addendum)
It was good to see you today!  For your hidradenitis, - Keep taking doxycycline until you can get in to surgery. We will try to call as well, keep your September. - You can add topical clindamycin as well - over the counter tylenol and ibuprofen for pain control - warm compresses   Sign up for My Chart to have easy access to your labs results, and communication with your primary care physician.  Feel free to call with any questions or concerns at any time, at [phone].   Take care,  Dr. Bufford Lope, [place]

## 2016-12-28 ENCOUNTER — Telehealth: Payer: Self-pay

## 2016-12-28 NOTE — Assessment & Plan Note (Signed)
Continue doxycycline until patient can see [place], called over and appointment cannot be moved up but patient is on waitlist if any earlier cancellations. Will add topical clindamycin as well. Advised OTC tylenol and ibuprofen for pain. Warm compresses for supportive care.

## 2016-12-28 NOTE — Telephone Encounter (Signed)
Pt contacted and informed of wait list at [facility]. Pt advised to watch for their phone call and to call back asap if missed. Pt voiced understanding.

## 2016-12-28 NOTE — Telephone Encounter (Signed)
-----   Message from Bufford Lope, DO sent at 12/28/2016  2:05 PM EDT ----- Can patient please be called that we contacted [facility] to ask if appointment can be moved up. They told us they have no earlier openings but she is on the wait list in case of earlier cancellations.

## 2017-01-24 ENCOUNTER — Ambulatory Visit (INDEPENDENT_AMBULATORY_CARE_PROVIDER_SITE_OTHER): Payer: Medicare Other | Admitting: Family Medicine

## 2017-01-24 VITALS — BP 112/60 | Temp 98.5°F | Wt 207.6 lb

## 2017-01-24 DIAGNOSIS — R300 Dysuria: Secondary | ICD-10-CM

## 2017-01-24 DIAGNOSIS — N898 Other specified noninflammatory disorders of vagina: Secondary | ICD-10-CM

## 2017-01-24 DIAGNOSIS — B379 Candidiasis, unspecified: Secondary | ICD-10-CM

## 2017-01-24 LAB — POCT WET PREP (WET MOUNT)
CLUE CELLS WET PREP WHIFF POC: NEGATIVE
Trichomonas Wet Prep HPF POC: ABSENT

## 2017-01-24 LAB — POCT UA - MICROSCOPIC ONLY

## 2017-01-24 LAB — POCT URINALYSIS DIP (MANUAL ENTRY)
BILIRUBIN UA: NEGATIVE
GLUCOSE UA: NEGATIVE mg/dL
Ketones, POC UA: NEGATIVE mg/dL
Leukocytes, UA: NEGATIVE
NITRITE UA: NEGATIVE
PH UA: 6 (ref 5.0–8.0)
Protein Ur, POC: NEGATIVE mg/dL
SPEC GRAV UA: 1.02 (ref 1.010–1.025)
UROBILINOGEN UA: 0.2 U/dL

## 2017-01-24 MED ORDER — FLUCONAZOLE 150 MG PO TABS: 150.0000 mg | ORAL_TABLET | Freq: Once | ORAL | 0 refills | Status: AC

## 2017-01-24 NOTE — Patient Instructions (Signed)
It was great seeing you today! We have addressed the following issues today  1. You have a yeast infection, I will prescribe Diflucan for you to take. One time pill. 2. Follow up with surgery tomorrow as scheduled.  If we did any lab work today, and the results require attention, either me or my nurse will get in touch with you. If everything is normal, you will get a letter in mail and a message via . If you don't hear from [place] in two weeks, please give [place] a call. Otherwise, we look forward to seeing you again at your next visit. If you have any questions or concerns before then, please call the clinic at [phone].  Please bring all your medications to every doctors visit  Sign up for My Chart to have easy access to your labs results, and communication with your Primary care physician. Please ask Front Desk for some assistance.   Please check-out at the front desk before leaving the clinic.    Take Care,   Dr. Andy Gauss    Vaginal Yeast infection, Adult Vaginal yeast infection is a condition that causes soreness, swelling, and redness (inflammation) of the vagina. It also causes vaginal discharge. This is a common condition. Some women get this infection frequently. What are the causes? This condition is caused by a change in the normal balance of the yeast (candida) and bacteria that live in the vagina. This change causes an overgrowth of yeast, which causes the inflammation. What increases the risk? This condition is more likely to develop in:  Women who take antibiotic medicines.  Women who have diabetes.  Women who take birth control pills.  Women who are pregnant.  Women who douche often.  Women who have a weak defense (immune) system.  Women who have been taking steroid medicines for a long time.  Women who frequently wear tight clothing.  What are the signs or symptoms? Symptoms of this condition include:  White, thick vaginal discharge.  Swelling, itching,  redness, and irritation of the vagina. The lips of the vagina (vulva) may be affected as well.  Pain or a burning feeling while urinating.  Pain during sex.  How is this diagnosed? This condition is diagnosed with a medical history and physical exam. This will include a pelvic exam. Your health care provider will examine a sample of your vaginal discharge under a microscope. Your health care provider may send this sample for testing to confirm the diagnosis. How is this treated? This condition is treated with medicine. Medicines may be over-the-counter or prescription. You may be told to use one or more of the following:  Medicine that is taken orally.  Medicine that is applied as a cream.  Medicine that is inserted directly into the vagina (suppository).  Follow these instructions at home:  Take or apply over-the-counter and prescription medicines only as told by your health care provider.  Do not have sex until your health care provider has approved. Tell your sex partner that you have a yeast infection. That person should go to his or her health care provider if he or she develops symptoms.  Do not wear tight clothes, such as pantyhose or tight pants.  Avoid using tampons until your health care provider approves.  Eat more yogurt. This may help to keep your yeast infection from returning.  Try taking a sitz bath to help with discomfort. This is a warm water bath that is taken while you are sitting down. The water  should only come up to your hips and should cover your buttocks. Do this 3-4 times per day or as told by your health care provider.  Do not douche.  Wear breathable, cotton underwear.  If you have diabetes, keep your blood sugar levels under control. Contact a health care provider if:  You have a fever.  Your symptoms go away and then return.  Your symptoms do not get better with treatment.  Your symptoms get worse.  You have new symptoms.  You develop  blisters in or around your vagina.  You have blood coming from your vagina and it is not your menstrual period.  You develop pain in your abdomen. This information is not intended to replace advice given to you by your health care provider. Make sure you discuss any questions you have with your health care provider. Document Released: 02/24/2005 Document Revised: 10/29/2015 Document Reviewed: 11/18/2014 Elsevier Interactive Patient Education  2018 [employer].

## 2017-01-24 NOTE — Progress Notes (Signed)
SUBJECTIVE:  36 y.o. female complains of white and odorless vaginal discharge for 1 week  Week(s). Patient also complains or vaginal itching and mild irritation with urination.Denies abnormal vaginal bleeding or significant pelvic pain or fever. No UTI symptoms. Denies history of known exposure to STD.  No LMP recorded.  OBJECTIVE:  She appears well, afebrile. Abdomen: benign, soft, nontender, no masses. Pelvic Exam: normal external genitalia, vulva, vagina, cervix, uterus and adnexa, VAGINA: normal appearing vagina with normal color and discharge, no lesions, CERVIX: normal appearing cervix without discharge or lesions, cervical discharge present - white and thin, WET MOUNT done - results: negative for pathogens, normal epithelial cells, hyphae. Urine dipstick: negative for all components.  ASSESSMENT:  Patient presented with vaginal itching and discharge. Exam with discharge noted. Wet mount was positive for yeast, no clue cells or trichomonas seen. Patent recently completed a course of Doxycycline for hidradenitis suppurativa.  PLAN:  --Diflucan 150 mg once, will offer second pill as patient will restart antibx regimen. Treatment: abstain from coitus during course of treatment ROV prn if symptoms persist or worsen.

## 2017-01-25 ENCOUNTER — Other Ambulatory Visit: Payer: Self-pay | Admitting: Surgery

## 2017-01-25 DIAGNOSIS — L732 Hidradenitis suppurativa: Secondary | ICD-10-CM | POA: Diagnosis not present

## 2017-01-29 DIAGNOSIS — L732 Hidradenitis suppurativa: Secondary | ICD-10-CM

## 2017-01-29 HISTORY — DX: Hidradenitis suppurativa: L73.2

## 2017-02-01 ENCOUNTER — Encounter (HOSPITAL_BASED_OUTPATIENT_CLINIC_OR_DEPARTMENT_OTHER): Payer: Self-pay | Admitting: *Deleted

## 2017-02-07 NOTE — H&P (Signed)
  Victoria Rodriguez  Location: [facility] Patient #: [id number] DOB: Sep 18, 1980 Single / Language: Victoria Rodriguez / Race: Black or African American Female   History of Present Illness (Victoria Salada A. Ninfa Linden MD; 01/25/2017 10:20 AM) Patient words: Hidradenitis R axilla.  The patient is a 36 year old female who presents with a complaint of Hidradenitis. This patient is here for long-term follow-up regarding her hidradenitis. I widely excised her left axilla in 2015. I will schedule to excise her right axilla last year but then she became pregnant preoperatively. She is now ready to discuss surgery. She's been doing well regarding the rest of her health. She still has multiple draining sinus tracts per her report in the right axilla. Her left axilla is doing well.   Allergies Victoria Rodriguez, CMA; 01/25/2017 9:55 AM) Victoria Rodriguez *ANALGESICS - OPIOID*   Medication History Victoria Rodriguez, CMA; 01/25/2017 9:55 AM) Clindamycin Phosphate (1% Gel, 1 (one) Application External two times daily, Taken starting 08/06/2015) Active. Ferrous Sulfate (325 (65 Fe)MG Tablet, Oral) Active. Prenatal Multivit-Min-Fe-FA (0.4MG  Tablet Chewable, Oral) Active. Medications Reconciled  Vitals (Victoria Rodriguez CMA; 01/25/2017 9:55 AM) 01/25/2017 9:54 AM Weight: 205.4 lb Height: 63in Body Surface Area: 1.96 m Body Mass Index: 36.38 kg/m  Temp.: 98.43F(Oral)  Pulse: 82 (Regular)  BP: 116/78 (Sitting, Left Arm, Standard)     Physical Exam (Victoria Iannello A. Ninfa Linden MD; 01/25/2017 10:20 AM) The physical exam findings are as follows: Note:General she's not appearance There are multiple draining chronic sinus tracts in her right axilla consistent with hidradenitis. Lungs clear CV RRR Abdomen soft, NT Psych normal    Assessment & Plan (Victoria Harding A. Ninfa Linden MD; 01/25/2017 10:22 AM) HIDRADENITIS AXILLARIS (L73.2) Impression: She is now ready and wanted to proceed with wide excision of the  hidradenitis in her right axilla. I will try her on clindamycin lotion or gel preoperatively. We discussed the surgical procedure in detail. We discussed the risks of surgery as well. These include but are not limited to bleeding, infection, had a chronic open wound, recurrence, etc. She understands and wished to proceed with surgery Current Plans Restarted Clindamycin Phosphate 1%, 1 (one) Lotion two times daily, 30 Gram, 01/25/2017, Ref. x3.

## 2017-02-08 ENCOUNTER — Ambulatory Visit (HOSPITAL_BASED_OUTPATIENT_CLINIC_OR_DEPARTMENT_OTHER): Payer: Medicare Other | Admitting: Anesthesiology

## 2017-02-08 ENCOUNTER — Encounter (HOSPITAL_BASED_OUTPATIENT_CLINIC_OR_DEPARTMENT_OTHER)
Admission: RE | Disposition: A | Discharge status: Home / Self Care | Payer: Self-pay | Source: Ambulatory Visit | Attending: Surgery

## 2017-02-08 ENCOUNTER — Ambulatory Visit (HOSPITAL_BASED_OUTPATIENT_CLINIC_OR_DEPARTMENT_OTHER)
Admission: RE | Admit: 2017-02-08 | Discharge: 2017-02-08 | Disposition: A | Discharge status: Home / Self Care | Payer: Medicare Other | Source: Ambulatory Visit | Attending: Surgery | Admitting: Surgery

## 2017-02-08 ENCOUNTER — Encounter (HOSPITAL_BASED_OUTPATIENT_CLINIC_OR_DEPARTMENT_OTHER): Payer: Self-pay | Admitting: Anesthesiology

## 2017-02-08 DIAGNOSIS — L732 Hidradenitis suppurativa: Secondary | ICD-10-CM | POA: Insufficient documentation

## 2017-02-08 DIAGNOSIS — L852 Keratosis punctata (palmaris et plantaris): Secondary | ICD-10-CM | POA: Diagnosis not present

## 2017-02-08 DIAGNOSIS — H8110 Benign paroxysmal vertigo, unspecified ear: Secondary | ICD-10-CM | POA: Diagnosis not present

## 2017-02-08 HISTORY — PX: HYDRADENITIS EXCISION: SHX5243

## 2017-02-08 HISTORY — DX: Hidradenitis suppurativa: L73.2

## 2017-02-08 SURGERY — EXCISION, HIDRADENITIS, AXILLA
Anesthesia: General | Site: Axilla | Laterality: Right

## 2017-02-08 MED ORDER — HYDROMORPHONE HCL 1 MG/ML IJ SOLN
INTRAMUSCULAR | Status: AC
  Filled 2017-02-08: qty 0.5

## 2017-02-08 MED ORDER — FENTANYL CITRATE (PF) 100 MCG/2ML IJ SOLN
INTRAMUSCULAR | Status: AC
  Filled 2017-02-08: qty 2

## 2017-02-08 MED ORDER — CEFAZOLIN SODIUM-DEXTROSE 2-4 GM/100ML-% IV SOLN
INTRAVENOUS | Status: AC
  Filled 2017-02-08: qty 100

## 2017-02-08 MED ORDER — PROPOFOL 10 MG/ML IV BOLUS
INTRAVENOUS | Status: AC
  Filled 2017-02-08: qty 20

## 2017-02-08 MED ORDER — MIDAZOLAM HCL 2 MG/2ML IJ SOLN: 1.0000 mg | INTRAMUSCULAR | Status: DC | PRN

## 2017-02-08 MED ORDER — MIDAZOLAM HCL 5 MG/5ML IJ SOLN
INTRAMUSCULAR | Status: DC | PRN
  Administered 2017-02-08: 2 mg via INTRAVENOUS

## 2017-02-08 MED ORDER — CHLORHEXIDINE GLUCONATE CLOTH 2 % EX PADS: 6.0000 | MEDICATED_PAD | Freq: Once | CUTANEOUS | Status: DC

## 2017-02-08 MED ORDER — PROMETHAZINE HCL 25 MG/ML IJ SOLN
6.2500 mg | INTRAMUSCULAR | Status: DC | PRN
  Administered 2017-02-08: 6.25 mg via INTRAVENOUS

## 2017-02-08 MED ORDER — MIDAZOLAM HCL 2 MG/2ML IJ SOLN
INTRAMUSCULAR | Status: AC
  Filled 2017-02-08: qty 2

## 2017-02-08 MED ORDER — FENTANYL CITRATE (PF) 100 MCG/2ML IJ SOLN: 50.0000 ug | INTRAMUSCULAR | Status: DC | PRN

## 2017-02-08 MED ORDER — HYDROMORPHONE HCL 1 MG/ML IJ SOLN
0.2500 mg | INTRAMUSCULAR | Status: DC | PRN
  Administered 2017-02-08: 0.25 mg via INTRAVENOUS
  Administered 2017-02-08: 0.5 mg via INTRAVENOUS
  Administered 2017-02-08 (×2): 0.25 mg via INTRAVENOUS

## 2017-02-08 MED ORDER — PROMETHAZINE HCL 25 MG/ML IJ SOLN
INTRAMUSCULAR | Status: AC
  Filled 2017-02-08: qty 1

## 2017-02-08 MED ORDER — DEXAMETHASONE SODIUM PHOSPHATE 4 MG/ML IJ SOLN
INTRAMUSCULAR | Status: DC | PRN
  Administered 2017-02-08: 10 mg via INTRAVENOUS

## 2017-02-08 MED ORDER — LIDOCAINE 2% (20 MG/ML) 5 ML SYRINGE
INTRAMUSCULAR | Status: AC
  Filled 2017-02-08: qty 5

## 2017-02-08 MED ORDER — ONDANSETRON HCL 4 MG/2ML IJ SOLN
INTRAMUSCULAR | Status: DC | PRN
  Administered 2017-02-08: 4 mg via INTRAVENOUS

## 2017-02-08 MED ORDER — SCOPOLAMINE 1 MG/3DAYS TD PT72
MEDICATED_PATCH | TRANSDERMAL | Status: AC
  Filled 2017-02-08: qty 1

## 2017-02-08 MED ORDER — BUPIVACAINE-EPINEPHRINE (PF) 0.25% -1:200000 IJ SOLN
INTRAMUSCULAR | Status: DC | PRN
  Administered 2017-02-08: 20 mL

## 2017-02-08 MED ORDER — OXYCODONE HCL 5 MG PO TABS: 5.0000 mg | ORAL_TABLET | Freq: Four times a day (QID) | ORAL | 0 refills | Status: DC | PRN

## 2017-02-08 MED ORDER — LACTATED RINGERS IV SOLN
INTRAVENOUS | Status: DC
  Administered 2017-02-08 (×3): via INTRAVENOUS

## 2017-02-08 MED ORDER — DOXYCYCLINE HYCLATE 100 MG PO TABS: 100.0000 mg | ORAL_TABLET | Freq: Two times a day (BID) | ORAL | 2 refills | Status: DC

## 2017-02-08 MED ORDER — SCOPOLAMINE 1 MG/3DAYS TD PT72
1.0000 | MEDICATED_PATCH | Freq: Once | TRANSDERMAL | Status: DC | PRN
  Administered 2017-02-08: 1.5 mg via TRANSDERMAL

## 2017-02-08 MED ORDER — FENTANYL CITRATE (PF) 100 MCG/2ML IJ SOLN
INTRAMUSCULAR | Status: DC | PRN
  Administered 2017-02-08: 100 ug via INTRAVENOUS
  Administered 2017-02-08 (×4): 25 ug via INTRAVENOUS

## 2017-02-08 MED ORDER — ONDANSETRON HCL 4 MG/2ML IJ SOLN
INTRAMUSCULAR | Status: AC
  Filled 2017-02-08: qty 2

## 2017-02-08 MED ORDER — CEFAZOLIN SODIUM-DEXTROSE 2-4 GM/100ML-% IV SOLN
2.0000 g | INTRAVENOUS | Status: AC
Start: 2017-02-08 — End: 2017-02-08
  Administered 2017-02-08: 2 g via INTRAVENOUS

## 2017-02-08 MED ORDER — LIDOCAINE HCL (CARDIAC) 20 MG/ML IV SOLN
INTRAVENOUS | Status: DC | PRN
  Administered 2017-02-08: 30 mg via INTRAVENOUS

## 2017-02-08 MED ORDER — SODIUM CHLORIDE 0.9 % IJ SOLN
INTRAMUSCULAR | Status: AC
  Filled 2017-02-08: qty 10

## 2017-02-08 MED ORDER — DEXAMETHASONE SODIUM PHOSPHATE 10 MG/ML IJ SOLN
INTRAMUSCULAR | Status: AC
  Filled 2017-02-08: qty 1

## 2017-02-08 MED ORDER — PROPOFOL 10 MG/ML IV BOLUS
INTRAVENOUS | Status: DC | PRN
  Administered 2017-02-08: 250 mg via INTRAVENOUS
  Administered 2017-02-08 (×2): 50 mg via INTRAVENOUS

## 2017-02-08 SURGICAL SUPPLY — 39 items
BLADE HEX COATED 2.75 (ELECTRODE) ×2 IMPLANT
BLADE SURG 15 STRL LF DISP TIS (BLADE) ×1 IMPLANT
BLADE SURG 15 STRL SS (BLADE) ×1
CANISTER SUCT 1200ML W/VALVE (MISCELLANEOUS) ×2 IMPLANT
CHLORAPREP W/TINT 26ML (MISCELLANEOUS) ×2 IMPLANT
COVER BACK TABLE 60X90IN (DRAPES) ×2 IMPLANT
COVER MAYO STAND STRL (DRAPES) ×2 IMPLANT
DERMABOND ADVANCED (GAUZE/BANDAGES/DRESSINGS) ×1
DERMABOND ADVANCED .7 DNX12 (GAUZE/BANDAGES/DRESSINGS) ×1 IMPLANT
DRAPE LAPAROTOMY 100X72 PEDS (DRAPES) ×2 IMPLANT
DRAPE UTILITY XL STRL (DRAPES) ×2 IMPLANT
DRSG PAD ABDOMINAL 8X10 ST (GAUZE/BANDAGES/DRESSINGS) ×2 IMPLANT
ELECT REM PT RETURN 9FT ADLT (ELECTROSURGICAL) ×2
ELECTRODE REM PT RTRN 9FT ADLT (ELECTROSURGICAL) ×1 IMPLANT
GAUZE SPONGE 4X4 12PLY STRL (GAUZE/BANDAGES/DRESSINGS) ×2 IMPLANT
GLOVE SURG SIGNA 7.5 PF LTX (GLOVE) ×2 IMPLANT
GOWN STRL REUS W/ TWL LRG LVL3 (GOWN DISPOSABLE) ×1 IMPLANT
GOWN STRL REUS W/ TWL XL LVL3 (GOWN DISPOSABLE) ×1 IMPLANT
GOWN STRL REUS W/TWL LRG LVL3 (GOWN DISPOSABLE) ×1
GOWN STRL REUS W/TWL XL LVL3 (GOWN DISPOSABLE) ×1
MICROMATRIX 1000MG (Tissue) ×2 IMPLANT
NEEDLE HYPO 25X1 1.5 SAFETY (NEEDLE) ×2 IMPLANT
NS IRRIG 1000ML POUR BTL (IV SOLUTION) ×2 IMPLANT
PACK BASIN DAY SURGERY FS (CUSTOM PROCEDURE TRAY) ×2 IMPLANT
PENCIL BUTTON HOLSTER BLD 10FT (ELECTRODE) ×2 IMPLANT
SLEEVE SCD COMPRESS KNEE MED (MISCELLANEOUS) IMPLANT
SOLUTION PARTIC MCRMTRX 1000MG (Tissue) ×1 IMPLANT
SPONGE LAP 4X18 X RAY DECT (DISPOSABLE) ×2 IMPLANT
SUT ETHILON 2 0 FS 18 (SUTURE) ×8 IMPLANT
SUT ETHILON 3 0 FSL (SUTURE) IMPLANT
SUT VIC AB 2-0 CT1 (SUTURE) ×4 IMPLANT
SUT VICRYL 3-0 CR8 SH (SUTURE) ×2 IMPLANT
SYR BULB 3OZ (MISCELLANEOUS) ×2 IMPLANT
SYR CONTROL 10ML LL (SYRINGE) ×2 IMPLANT
TAPE CLOTH SURG 6X10 WHT LF (GAUZE/BANDAGES/DRESSINGS) ×2 IMPLANT
TOWEL OR 17X24 6PK STRL BLUE (TOWEL DISPOSABLE) ×2 IMPLANT
TOWEL OR NON WOVEN STRL DISP B (DISPOSABLE) ×2 IMPLANT
TUBE CONNECTING 20X1/4 (TUBING) ×2 IMPLANT
YANKAUER SUCT BULB TIP NO VENT (SUCTIONS) ×2 IMPLANT

## 2017-02-08 NOTE — Op Note (Addendum)
WIDE EXCISION HIDRADENITIS RIGHT  AXILLA  Procedure Note  Victoria Rodriguez 02/08/2017   Pre-op Diagnosis: hidradenitis     Post-op Diagnosis: same  Procedure(s): WIDE EXCISION HIDRADENITIS RIGHT  AXILLA (36 square cm of skin and subcutaneous tissue) Placement of A-cell xenograft powder  Surgeon(s): Coralie Keens, MD  Anesthesia: General  Staff:  Circulator: Faythe Dingwall, RN; Harrel Lemon, RN Scrub Person: Lorenza Burton, CST  Estimated Blood Loss: Minimal               Procedure:   The patient was brought to the operating room and identified as the correct patient. She is placed upon the operating table and general anesthesia was induced. Her right axilla was prepped and draped in the usual sterile fashion. She had multiple sinus tracts from the extensive hidradenitis in the right axilla. I anesthetized the skin around these Marcaine. I then performed a large elliptical incision in the axilla incorporating all the draining tracts. This tendency reactors tissue with electrocautery. I then excised 36 cm of skin and subjacent tissue which included all the hidradenitis with electrocautery. I then achieved hemostasis with cautery. She had extensive inflammatory changes in the axilla from her chronic hidradenitis. At this point, I made the intraoperative decision to place a xenograft powder to help with wound healing of this extensive wound. I then placed a-cell xenograft powder into the open wound. I then approximated some of the incision with interrupted 2-0 Vicryl sutures. I then closed the skin with interrupted figure-of-eight and simple interrupted 2-0 nylon sutures. Gauze and tape were then applied. The patient tolerated procedure well. All the counts were correct at the end of the procedure. The patient was next abated in the operating room and taken to condition to the recovery room.          Timoth Schara A   Date: 02/08/2017  Time: 10:01 AM

## 2017-02-08 NOTE — Anesthesia Procedure Notes (Signed)
Procedure Name: LMA Insertion Date/Time: 02/08/2017 9:25 AM Performed by: Tawni Millers Pre-anesthesia Checklist: Patient identified, Emergency Drugs available, Suction available and Patient being monitored Patient Re-evaluated:Patient Re-evaluated prior to induction Oxygen Delivery Method: Circle system utilized Preoxygenation: Pre-oxygenation with 100% oxygen Induction Type: IV induction Ventilation: Mask ventilation without difficulty LMA: LMA inserted LMA Size: 4.0 Number of attempts: 1 Airway Equipment and Method: Bite block Placement Confirmation: positive ETCO2 Tube secured with: Tape Dental Injury: Teeth and Oropharynx as per pre-operative assessment

## 2017-02-08 NOTE — Interval H&P Note (Signed)
History and Physical Interval Note:no change in H and P  02/08/2017 8:57 AM  Victoria Rodriguez  has presented today for surgery, with the diagnosis of hidradenitis  The various methods of treatment have been discussed with the patient and family. After consideration of risks, benefits and other options for treatment, the patient has consented to  Procedure(s): WIDE EXCISION HIDRADENITIS RIGHT  AXILLA (Right) as a surgical intervention .  The patient's history has been reviewed, patient examined, no change in status, stable for surgery.  I have reviewed the patient's chart and labs.  Questions were answered to the patient's satisfaction.     [name] A

## 2017-02-08 NOTE — Anesthesia Preprocedure Evaluation (Addendum)
Anesthesia Evaluation  Patient identified by MRN, date of birth, ID band Patient awake    Reviewed: Allergy & Precautions, NPO status , Patient's Chart, lab work & pertinent test results  History of Anesthesia Complications Negative for: history of anesthetic complications  Airway Mallampati: I  TM Distance: >3 FB Neck ROM: Full    Dental  (+) Teeth Intact   Pulmonary neg pulmonary ROS,    Pulmonary exam normal        Cardiovascular negative cardio ROS Normal cardiovascular exam     Neuro/Psych negative neurological ROS     GI/Hepatic negative GI ROS, Neg liver ROS,   Endo/Other  neg diabetes  Renal/GU negative Renal ROS     Musculoskeletal   Abdominal (+) + obese,   Peds  Hematology negative hematology ROS (+)   Anesthesia Other Findings   Reproductive/Obstetrics S/p tubal ligation                           Anesthesia Physical  Anesthesia Plan  ASA: II  Anesthesia Plan: General   Post-op Pain Management:    Induction: Intravenous  PONV Risk Score and Plan: 2 and Ondansetron, Dexamethasone and Midazolam  Airway Management Planned: LMA  Additional Equipment:   Intra-op Plan:   Post-operative Plan: Extubation in OR  Informed Consent: I have reviewed the patients History and Physical, chart, labs and discussed the procedure including the risks, benefits and alternatives for the proposed anesthesia with the patient or authorized representative who has indicated his/her understanding and acceptance.   Dental advisory given  Plan Discussed with: CRNA  Anesthesia Plan Comments:        Anesthesia Quick Evaluation

## 2017-02-08 NOTE — Anesthesia Postprocedure Evaluation (Signed)
Anesthesia Post Note  Patient: Victoria Rodriguez  Procedure(s) Performed: Procedure(s) (LRB): WIDE EXCISION HIDRADENITIS RIGHT  AXILLA (Right)     Patient location during evaluation: PACU Anesthesia Type: General Level of consciousness: awake and alert Pain management: pain level controlled Vital Signs Assessment: post-procedure vital signs reviewed and stable Respiratory status: spontaneous breathing, nonlabored ventilation, respiratory function stable and patient connected to nasal cannula oxygen Cardiovascular status: blood pressure returned to baseline and stable Postop Assessment: no signs of nausea or vomiting Anesthetic complications: no    Last Vitals:  Vitals:   02/08/17 1230 02/08/17 1245  BP: 118/67 117/73  Pulse: 72 84  Resp:  18  Temp:  36.5 C  SpO2: 98% 100%    Last Pain:  Vitals:   02/08/17 1245  TempSrc:   PainSc: 1                  Ryan P Ellender

## 2017-02-08 NOTE — Discharge Instructions (Signed)
Expect wound drainage daily.  Cover with a dry guaze  Ok to shower starting tomorrow  Ice pack, tylenol, and ibuprofen also for pain    Call your surgeon if you experience:   1.  Fever over 101.0. 2.  Inability to urinate. 3.  Nausea and/or vomiting. 4.  Extreme swelling or bruising at the surgical site. 5.  Continued bleeding from the incision. 6.  Increased pain, redness or drainage from the incision. 7.  Problems related to your pain medication. 8.  Any problems and/or concerns    Post Anesthesia Home Care Instructions  Activity: Get plenty of rest for the remainder of the day. A responsible individual must stay with you for 24 hours following the procedure.  For the next 24 hours, DO NOT: -Drive a car -Paediatric nurse -Drink alcoholic beverages -Take any medication unless instructed by your physician -Make any legal decisions or sign important papers.  Meals: Start with liquid foods such as gelatin or soup. Progress to regular foods as tolerated. Avoid greasy, spicy, heavy foods. If nausea and/or vomiting occur, drink only clear liquids until the nausea and/or vomiting subsides. Call your physician if vomiting continues.  Special Instructions/Symptoms: Your throat may feel dry or sore from the anesthesia or the breathing tube placed in your throat during surgery. If this causes discomfort, gargle with warm salt water. The discomfort should disappear within 24 hours.  If you had a scopolamine patch placed behind your ear for the management of post- operative nausea and/or vomiting:  1. The medication in the patch is effective for 72 hours, after which it should be removed.  Wrap patch in a tissue and discard in the trash. Wash hands thoroughly with soap and water. 2. You may remove the patch earlier than 72 hours if you experience unpleasant side effects which may include dry mouth, dizziness or visual disturbances. 3. Avoid touching the patch. Wash your hands with soap  and water after contact with the patch.

## 2017-02-08 NOTE — Transfer of Care (Signed)
Immediate Anesthesia Transfer of Care Note  Patient: BRANDIN DILDAY  Procedure(s) Performed: Procedure(s): WIDE EXCISION HIDRADENITIS RIGHT  AXILLA (Right)  Patient Location: PACU  Anesthesia Type:General  Level of Consciousness: awake  Airway & Oxygen Therapy: Patient Spontanous Breathing and Patient connected to face mask oxygen  Post-op Assessment: Report given to RN and Post -op Vital signs reviewed and stable  Post vital signs: Reviewed and stable  Last Vitals:  Vitals:   02/08/17 0745  BP: 130/67  Pulse: 98  Resp: 18  Temp: 36.9 C  SpO2: 100%    Last Pain:  Vitals:   02/08/17 0745  TempSrc: Oral  PainSc: 3       Patients Stated Pain Goal: 1 (15/72/62 0355)  Complications: No apparent anesthesia complications

## 2017-02-09 ENCOUNTER — Encounter (HOSPITAL_BASED_OUTPATIENT_CLINIC_OR_DEPARTMENT_OTHER): Payer: Self-pay | Admitting: Surgery

## 2017-03-01 ENCOUNTER — Encounter: Payer: Self-pay | Admitting: Family Medicine

## 2017-03-01 ENCOUNTER — Ambulatory Visit (INDEPENDENT_AMBULATORY_CARE_PROVIDER_SITE_OTHER): Payer: Medicare Other | Admitting: Family Medicine

## 2017-03-01 VITALS — BP 110/68 | HR 72 | Temp 99.1°F | Ht 63.0 in | Wt 200.0 lb

## 2017-03-01 DIAGNOSIS — L918 Other hypertrophic disorders of the skin: Secondary | ICD-10-CM | POA: Diagnosis not present

## 2017-03-01 NOTE — Patient Instructions (Signed)
Skin Tag, Adult A skin tag (acrochordon) is a soft, extra growth of skin. Most skin tags are flesh-colored and rarely bigger than a pencil eraser. They commonly form near areas where there are folds in the skin, such as the armpit or groin. Skin tags are not dangerous, and they do not spread from person to person (are not contagious). You may have one skin tag or several. Skin tags do not require treatment. However, your health care provider may recommend removal of a skin tag if it:  Gets irritated from clothing.  Bleeds.  Is visible and unsightly.  Your health care provider can remove skin tags with a simple surgical procedure or a procedure that involves freezing the skin tag. Follow these instructions at home:  Watch for any changes in your skin tag. A normal skin tag does not require any other special care at home.  Take over-the-counter and prescription medicines only as told by your health care provider.  Keep all follow-up visits as told by your health care provider. This is important. Contact a health care provider if:  You have a skin tag that: ? Becomes painful. ? Changes color. ? Bleeds. ? Swells.  You develop more skin tags. This information is not intended to replace advice given to you by your health care provider. Make sure you discuss any questions you have with your health care provider. Document Released: 06/01/2015 Document Revised: 01/11/2016 Document Reviewed: 06/01/2015 Elsevier Interactive Patient Education  [employer].  It was a pleasure meeting you today.   Today we discussed your skin tag and removed the tag. We sent the tag for pathology review.  Please follow up in 1 week or sooner if symptoms persist or worsen. Please call the clinic immediately if you develop fever, swelling, drainage, or redness.   Our clinic's number is [phone]. Please call with questions or concerns.   Thank you,  Caroline More, DO

## 2017-03-01 NOTE — Assessment & Plan Note (Signed)
Both verbal and written informed consent obtained prior to procedure. Skin tag shave excision performed in clinic using sterile technique.   Area was initially prepped with alcohol swab prior to lidocaine injection. Needle with lidocaine was inserted bilaterally and aspirated to ensure no blood was drawn back. 2cc 1% Lidocaine with epi was injected to raise a wheal. Area was prepped with betadine prior to procedure. Prior to removal with scalpel, area was palpated to ensure adequate anesthesia. Skin tag was lifted off skin with forceps. Scalpel was placed at base of skin tag and used smooth strokes to remove tag. Gauze applied with pressure and silver nitrate was applied to area until adequate hemostasis achieved. Band aid for patient comfort was placed.   Patient tolerated procedure well with negligible blood loss. Removed skin tag was sent for pathology   Advised patient to look for signs of bleeding or infection and to return if problems arise.   Follow up in 1 week

## 2017-03-01 NOTE — Progress Notes (Signed)
   Subjective:    Patient ID: Victoria Rodriguez, female    DOB: August 16, 1980, 36 y.o.   MRN: [id number]   CC: Skin tag   HPI:  Skin tag  Patient reports skin tag on inner right thigh. Skin tag has been present for >1 year. Patient states skin tag is now painful and bigger x 2 weeks. Pain is throughout the day regardless of activity. Patient also notes some stinging pain. Patient has also noticed that area is slightly swollen x 2 weeks. Denies itching. States skin tag is not getting caught on clothes. Denies changes in color, drainage, or  bleeding. Patient denies redness to area. Denies fever, chills, or diaphoresis.  Smoking status reviewed. No history or current smoking.   Review of Systems All negative other than noted in HPI  Objective:  BP 110/68   Pulse 72   Temp 99.1 F (37.3 C) (Oral)   Ht 5\' 3"  (1.6 m)   Wt 200 lb (90.7 kg)   LMP 02/25/2017 (Exact Date)   BMI 35.43 kg/m  Vitals and nursing note reviewed  General: well nourished, in no acute distress Cardiac: RRR, clear S1 and S2, no murmurs, rubs, or gallops Neuro: alert and oriented, no focal deficits Extremities: Right inner thigh slightly swollen  Skin:      Assessment & Plan:    Skin tag Both verbal and written informed consent obtained prior to procedure. Skin tag shave excision performed in clinic using sterile technique.   Area was initially prepped with alcohol swab prior to lidocaine injection. Needle with lidocaine was inserted bilaterally and aspirated to ensure no blood was drawn back. 2cc 1% Lidocaine with epi was injected to raise a wheal. Area was prepped with betadine prior to procedure. Prior to removal with scalpel, area was palpated to ensure adequate anesthesia. Skin tag was lifted off skin with forceps. Scalpel was placed at base of skin tag and used smooth strokes to remove tag. Gauze applied with pressure and silver nitrate was applied to area until adequate hemostasis achieved. Band aid for  patient comfort was placed.   Patient tolerated procedure well with negligible blood loss. Removed skin tag was sent for pathology   Advised patient to look for signs of bleeding or infection and to return if problems arise.   Follow up in 1 week   Return in about 1 week (around 03/08/2017).   Caroline More, DO, PGY-1

## 2017-03-05 ENCOUNTER — Encounter: Payer: Self-pay | Admitting: Family Medicine

## 2017-03-09 ENCOUNTER — Emergency Department (HOSPITAL_COMMUNITY): Payer: Medicare Other

## 2017-03-09 ENCOUNTER — Encounter (HOSPITAL_COMMUNITY): Payer: Self-pay | Admitting: Emergency Medicine

## 2017-03-09 ENCOUNTER — Emergency Department (HOSPITAL_COMMUNITY)
Admission: EM | Admit: 2017-03-09 | Discharge: 2017-03-10 | Disposition: A | Discharge status: Home / Self Care | Payer: Medicare Other | Attending: Emergency Medicine | Admitting: Emergency Medicine

## 2017-03-09 DIAGNOSIS — S9032XA Contusion of left foot, initial encounter: Secondary | ICD-10-CM | POA: Diagnosis not present

## 2017-03-09 DIAGNOSIS — Z791 Long term (current) use of non-steroidal anti-inflammatories (NSAID): Secondary | ICD-10-CM | POA: Insufficient documentation

## 2017-03-09 DIAGNOSIS — W231XXA Caught, crushed, jammed, or pinched between stationary objects, initial encounter: Secondary | ICD-10-CM | POA: Insufficient documentation

## 2017-03-09 DIAGNOSIS — S9782XA Crushing injury of left foot, initial encounter: Secondary | ICD-10-CM | POA: Diagnosis not present

## 2017-03-09 DIAGNOSIS — Y929 Unspecified place or not applicable: Secondary | ICD-10-CM | POA: Insufficient documentation

## 2017-03-09 DIAGNOSIS — Y9389 Activity, other specified: Secondary | ICD-10-CM | POA: Insufficient documentation

## 2017-03-09 DIAGNOSIS — Y999 Unspecified external cause status: Secondary | ICD-10-CM | POA: Insufficient documentation

## 2017-03-09 NOTE — ED Triage Notes (Signed)
Pt with complaints of Left toe and foot pain. Reports foot was slammed in truck door yesterday. Reports pain and difficulty walking on foot.

## 2017-03-10 MED ORDER — IBUPROFEN 600 MG PO TABS: 600.0000 mg | ORAL_TABLET | Freq: Four times a day (QID) | ORAL | 0 refills | Status: DC | PRN

## 2017-03-10 NOTE — ED Notes (Addendum)
Pt left without d/c instructions and prescription

## 2017-03-10 NOTE — Progress Notes (Signed)
Orthopedic Tech Progress Note Patient Details:  Victoria Rodriguez [birth year] [id number]  Ortho Devices Type of Ortho Device: Postop shoe/boot Ortho Device/Splint Location: lle Ortho Device/Splint Interventions: Ordered, Application, Adjustment   Karolee Stamps 03/10/2017, 12:59 AM

## 2017-03-10 NOTE — ED Provider Notes (Signed)
[facility] DEPT Provider Note   CSN: [id number] Arrival date & time: 03/09/17  2102     History   Chief Complaint Chief Complaint  Patient presents with  . Foot Pain    HPI Victoria Rodriguez is a 36 y.o. female.  HPI Patient slammed her left foot in the car door yesterday. She reports that is been very painful today and difficult to walk on. Most of the pain is across the tops of her first second and third toes. No other associated symptoms. Past Medical History:  Diagnosis Date  . Right axillary hidradenitis 01/2017    Patient Active Problem List   Diagnosis Date Noted  . Skin tag 03/01/2017  . BPPV (benign paroxysmal positional vertigo) 02/16/2016  . BMI 40.0-44.9, adult ([place]) 12/23/2015  . Chronic Recurrent Hidradenitis Suppurativa 04/18/2012  . Keratosis punctata 02/29/2012    Past Surgical History:  Procedure Laterality Date  . AXILLARY HIDRADENITIS EXCISION Bilateral 05/15/2007  . AXILLARY HIDRADENITIS EXCISION Left 01/23/2007; 10/25/2007  . AXILLARY HIDRADENITIS EXCISION Right 09/11/2009  . CESAREAN SECTION N/A 01/25/2016   Procedure: CESAREAN SECTION;  Surgeon: Mora Bellman, MD;  Location: [facility];  Service: Obstetrics;  Laterality: N/A;  . HYDRADENITIS EXCISION Left 02/12/2014   Procedure: EXCISION HIDRADENITIS AXILLA;  Surgeon: Coralie Keens, MD;  Location: [facility];  Service: General;  Laterality: Left;  . HYDRADENITIS EXCISION Right 02/08/2017   Procedure: WIDE EXCISION HIDRADENITIS RIGHT  AXILLA;  Surgeon: Coralie Keens, MD;  Location: [facility];  Service: General;  Laterality: Right;  . IRRIGATION AND DEBRIDEMENT ABSCESS Left 11/16/2013   Procedure: IRRIGATION AND DEBRIDEMENT LEFT AXILLARY ABSCESS;  Surgeon: Pedro Earls, MD;  Location: [facility];  Service: General;  Laterality: Left;  . LAPAROSCOPIC TUBAL LIGATION Bilateral 04/02/2016   Procedure: LAPAROSCOPIC TUBAL LIGATION;  Surgeon: Woodroe Mode,  MD;  Location: [facility];  Service: Gynecology;  Laterality: Bilateral;    OB History    Gravida Para Term Preterm AB Living   5 2 2  0 3 2   SAB TAB Ectopic Multiple Live Births   1 2   0 1      Obstetric Comments   2000: 6lbs 9oz TSVD       Home Medications    Prior to Admission medications   Medication Sig Start Date End Date Taking? Authorizing Provider  doxycycline (VIBRA-TABS) 100 MG tablet Take 1 tablet (100 mg total) by mouth 2 (two) times daily. 02/08/17   Coralie Keens, MD  ibuprofen (ADVIL,MOTRIN) 200 MG tablet Take 200 mg by mouth every 6 (six) hours as needed.    [provider]  ibuprofen (ADVIL,MOTRIN) 600 MG tablet Take 1 tablet (600 mg total) by mouth every 6 (six) hours as needed. 03/10/17   Charlesetta Shanks, MD  oxyCODONE (OXY IR/ROXICODONE) 5 MG immediate release tablet Take 1-2 tablets (5-10 mg total) by mouth every 6 (six) hours as needed for moderate pain or severe pain. 02/08/17   Coralie Keens, MD    Family History Family History  Problem Relation Age of Onset  . Asthma Mother   . Diabetes Father   . Asthma Sister   . Asthma Brother     Social History Social History  Substance Use Topics  . Smoking status: Never Smoker  . Smokeless tobacco: Never Used  . Alcohol use No     Allergies   Vicodin [hydrocodone-acetaminophen]   Review of Systems Review of Systems Constitutional: No fever chills or general illness Respiratory:  No shortness of breath  Physical Exam Updated Vital Signs BP 131/76 (BP Location: Right Arm)   Pulse 72   Temp 98.2 F (36.8 C) (Oral)   Resp 18   Ht 5\' 3"  (1.6 m)   Wt 90.7 kg (200 lb)   LMP 02/25/2017 (Exact Date)   SpO2 99%   BMI 35.43 kg/m   Physical Exam  Constitutional: She is oriented to person, place, and time. She appears well-developed and well-nourished. No distress.  HENT:  Head: Normocephalic and atraumatic.  Eyes: EOM are normal.  Pulmonary/Chest: Effort normal.  Musculoskeletal:  Normal range of motion.  Minor bruising across the dorsal surface of the first second and third toes. No significant swelling. Tenderness of the ankle, arch or sole of the foot.  Neurological: She is alert and oriented to person, place, and time. No cranial nerve deficit. She exhibits normal muscle tone. Coordination normal.  Skin: Skin is warm and dry.  Psychiatric: She has a normal mood and affect.     ED Treatments / Results  Labs (all labs ordered are listed, but only abnormal results are displayed) Labs Reviewed - No data to display  EKG  EKG Interpretation None       Radiology Dg Foot Complete Left  Result Date: 03/09/2017 CLINICAL DATA:  Crush injury in a truck door tonight. EXAM: LEFT FOOT - COMPLETE 3+ VIEW COMPARISON:  None. FINDINGS: There is no evidence of fracture or dislocation. There is no evidence of arthropathy or other focal bone abnormality. Soft tissues are unremarkable. IMPRESSION: Negative. Electronically Signed   By: Andreas Newport M.D.   On: 03/09/2017 21:43    Procedures Procedures (including critical care time)  Medications Ordered in ED Medications - No data to display   Initial Impression / Assessment and Plan / ED Course  I have reviewed the triage vital signs and the nursing notes.  Pertinent labs & imaging results that were available during my care of the patient were reviewed by me and considered in my medical decision making (see chart for details).      Final Clinical Impressions(s) / ED Diagnoses   Final diagnoses:  Contusion of left foot, initial encounter  X-rays negative for fracture. On physical examination patient does not have any deformities or large swelling. Bruising present. Plan will be for postop shoe, ibuprofen, elevation and icing.  New Prescriptions New Prescriptions   IBUPROFEN (ADVIL,MOTRIN) 600 MG TABLET    Take 1 tablet (600 mg total) by mouth every 6 (six) hours as needed.     Charlesetta Shanks, MD 03/10/17  [phone]

## 2017-03-25 ENCOUNTER — Ambulatory Visit (INDEPENDENT_AMBULATORY_CARE_PROVIDER_SITE_OTHER): Payer: Medicare Other | Admitting: Family Medicine

## 2017-03-25 ENCOUNTER — Encounter: Payer: Self-pay | Admitting: Family Medicine

## 2017-03-25 ENCOUNTER — Other Ambulatory Visit (HOSPITAL_COMMUNITY)
Admission: RE | Admit: 2017-03-25 | Discharge: 2017-03-25 | Disposition: A | Discharge status: Home / Self Care | Payer: Medicare Other | Source: Ambulatory Visit | Attending: Family Medicine | Admitting: Family Medicine

## 2017-03-25 VITALS — BP 110/60 | HR 74 | Temp 98.5°F | Ht 63.0 in | Wt 200.0 lb

## 2017-03-25 DIAGNOSIS — N898 Other specified noninflammatory disorders of vagina: Secondary | ICD-10-CM | POA: Insufficient documentation

## 2017-03-25 HISTORY — DX: Other specified noninflammatory disorders of vagina: N89.8

## 2017-03-25 LAB — POCT URINALYSIS DIP (MANUAL ENTRY)
Bilirubin, UA: NEGATIVE
GLUCOSE UA: NEGATIVE mg/dL
Leukocytes, UA: NEGATIVE
NITRITE UA: NEGATIVE
PH UA: 6 (ref 5.0–8.0)
SPEC GRAV UA: 1.025 (ref 1.010–1.025)
UROBILINOGEN UA: 0.2 U/dL

## 2017-03-25 LAB — POCT UA - MICROSCOPIC ONLY

## 2017-03-25 LAB — POCT WET PREP (WET MOUNT)
CLUE CELLS WET PREP WHIFF POC: POSITIVE
Trichomonas Wet Prep HPF POC: ABSENT

## 2017-03-25 MED ORDER — METRONIDAZOLE 500 MG PO TABS: 500.0000 mg | ORAL_TABLET | Freq: Two times a day (BID) | ORAL | 0 refills | Status: AC

## 2017-03-25 MED ORDER — FLUCONAZOLE 150 MG PO TABS: 150.0000 mg | ORAL_TABLET | Freq: Once | ORAL | 0 refills | Status: AC

## 2017-03-25 NOTE — Assessment & Plan Note (Addendum)
  Patient with itching, pain. U/a negative for UTI. Wet prep shows clue cells and some yeast. Will check gc/chlamydia as well per patient request  -rx for flagyl given -rx for diflucan  -will inform patient of results from gonorrhea/chlamydia tests -follow up as needed

## 2017-03-25 NOTE — Patient Instructions (Signed)
   It was great seeing you today!  Please take the complete course of Flagyl for BV (see below). If you develop a yeast infection you can take the diflucan, one time dose.   Please return to be seen if your symptoms fail to improve.  If you have questions or concerns please do not hesitate to call at [phone].  Lucila Maine, DO PGY-2, [facility] 03/25/2017 3:13 PM   Bacterial Vaginosis Bacterial vaginosis is an infection of the vagina. It happens when too many germs (bacteria) grow in the vagina. This infection puts you at risk for infections from sex (STIs). Treating this infection can lower your risk for some STIs. You should also treat this if you are pregnant. It can cause your baby to be born early. Follow these instructions at home: Medicines  Take over-the-counter and prescription medicines only as told by your doctor.  Take or use your antibiotic medicine as told by your doctor. Do not stop taking or using it even if you start to feel better. General instructions  If you your sexual partner is a woman, tell her that you have this infection. She needs to get treatment if she has symptoms. If you have a female partner, he does not need to be treated.  During treatment: ? Avoid sex. ? Do not douche. ? Avoid alcohol as told. ? Avoid breastfeeding as told.  Drink enough fluid to keep your pee (urine) clear or pale yellow.  Keep your vagina and butt (rectum) clean. ? Wash the area with warm water every day. ? Wipe from front to back after you use the toilet.  Keep all follow-up visits as told by your doctor. This is important. Preventing this condition  Do not douche.  Use only warm water to wash around your vagina.  Use protection when you have sex. This includes: ? Latex condoms. ? Dental dams.  Limit how many people you have sex with. It is best to only have sex with the same person (be monogamous).  Get tested for STIs. Have your partner  get tested.  Wear underwear that is cotton or lined with cotton.  Avoid tight pants and pantyhose. This is most important in summer.  Do not use any products that have nicotine or tobacco in them. These include cigarettes and e-cigarettes. If you need help quitting, ask your doctor.  Do not use illegal drugs.  Limit how much alcohol you drink. Contact a doctor if:  Your symptoms do not get better, even after you are treated.  You have more discharge or pain when you pee (urinate).  You have a fever.  You have pain in your belly (abdomen).  You have pain with sex.  Your bleed from your vagina between periods. Summary  This infection happens when too many germs (bacteria) grow in the vagina.  Treating this condition can lower your risk for some infections from sex (STIs).  You should also treat this if you are pregnant. It can cause early (premature) birth.  Do not stop taking or using your antibiotic medicine even if you start to feel better. This information is not intended to replace advice given to you by your health care provider. Make sure you discuss any questions you have with your health care provider. Document Released: 02/24/2008 Document Revised: 01/31/2016 Document Reviewed: 01/31/2016 Elsevier Interactive Patient Education  2017 [employer].

## 2017-03-25 NOTE — Progress Notes (Signed)
    Subjective:    Patient ID: Victoria Rodriguez, female    DOB: June 07, 1980, 36 y.o.   MRN: [id number]   CC: vaginal irritation  Patient reports several day history of vaginal irritation. She is unsure of discharge or not. She reports some pain with urination, denies urinary urgency or frequency. She denies fevers, chills, nausea, vomiting, pelvic pain. She reports she gets frequent yeast infections but this does not seem similar. She is about to come on to her menstrual cycle. Denies new sexual partners but would like to get checked for gc/chlamydia.  Smoking status reviewed- non-smoker  Review of Systems- see HPI   Objective:  BP 110/60   Pulse 74   Temp 98.5 F (36.9 C) (Oral)   Ht 5\' 3"  (1.6 m)   Wt 200 lb (90.7 kg)   LMP 02/25/2017 (Exact Date)   BMI 35.43 kg/m  Vitals and nursing note reviewed  General: well nourished, in no acute distress GU: normal female external genitalia, no lesions or rashes appreciated. Scant amount of vaginal discharge present. Cervix pink without lesions. No CMT tenderness. Extremities: no edema or cyanosis. Skin: warm and dry, no rashes noted Neuro: alert and oriented, no focal deficits   Assessment & Plan:    Vaginal irritation  Patient with itching, pain. U/a negative for UTI. Wet prep shows clue cells and some yeast. Will check gc/chlamydia as well per patient request  -rx for flagyl given -rx for diflucan  -will inform patient of results from gonorrhea/chlamydia tests -follow up as needed     Return if symptoms worsen or fail to improve.   Lucila Maine, DO Family Medicine Resident PGY-2

## 2017-03-28 LAB — CERVICOVAGINAL ANCILLARY ONLY
Chlamydia: NEGATIVE
Neisseria Gonorrhea: NEGATIVE

## 2017-03-31 ENCOUNTER — Encounter: Payer: Self-pay | Admitting: Family Medicine

## 2017-06-29 ENCOUNTER — Telehealth: Payer: Self-pay | Admitting: Family Medicine

## 2017-06-29 NOTE — Telephone Encounter (Signed)
Pt wants a referral to a dentist. She only has medicare, because her medicaid is family plan only. She want's to know where she can go for dental. Please advise

## 2017-07-01 NOTE — Telephone Encounter (Signed)
Unfortunately, Medicare does not cover dental care.   I recommend that Victoria Rodriguez contact [facility] department to see if there are free or low-coast dental programs in the area.

## 2017-07-01 NOTE — Telephone Encounter (Signed)
LM for patient to call back.  Please relay message from Dr. McDiarmid regarding dental care. Zyler Hyson,CMA

## 2017-07-07 ENCOUNTER — Other Ambulatory Visit: Payer: Self-pay

## 2017-07-07 ENCOUNTER — Ambulatory Visit (INDEPENDENT_AMBULATORY_CARE_PROVIDER_SITE_OTHER): Payer: Medicare Other | Admitting: Family Medicine

## 2017-07-07 ENCOUNTER — Encounter: Payer: Self-pay | Admitting: Family Medicine

## 2017-07-07 VITALS — BP 108/60 | Temp 98.9°F | Wt 192.8 lb

## 2017-07-07 DIAGNOSIS — Z79899 Other long term (current) drug therapy: Secondary | ICD-10-CM

## 2017-07-07 DIAGNOSIS — J209 Acute bronchitis, unspecified: Secondary | ICD-10-CM | POA: Diagnosis not present

## 2017-07-07 DIAGNOSIS — L732 Hidradenitis suppurativa: Secondary | ICD-10-CM | POA: Diagnosis not present

## 2017-07-07 MED ORDER — GUAIFENESIN-CODEINE 100-10 MG/5ML PO SOLN: 10.0000 mL | Freq: Every evening | ORAL | 0 refills | Status: DC | PRN

## 2017-07-07 MED ORDER — DOXYCYCLINE HYCLATE 100 MG PO TABS: 100.0000 mg | ORAL_TABLET | Freq: Every day | ORAL | 2 refills | Status: DC

## 2017-07-07 MED ORDER — BENZONATATE 200 MG PO CAPS: 200.0000 mg | ORAL_CAPSULE | Freq: Three times a day (TID) | ORAL | 0 refills | Status: DC | PRN

## 2017-07-07 NOTE — Patient Instructions (Signed)
It was good to see you again. I believe you have a viral bronchitis.   You can take the cough medicine with codeine at bedtime if you need it.  Take the Tessalon pearls for cough during the day if you need it.    Hidradenitis  We will set up an consultation with the dermatologist at [facility] to discuss treatment options for control of your hidradenitis.    Start taking Doxycycline 100 mg daily to try to keep your hidradenitis under some control.  We will need to check your blood work to make sure the doxycycline is not injuring your liver or blood cells.  This is rare.

## 2017-07-08 ENCOUNTER — Encounter: Payer: Self-pay | Admitting: Family Medicine

## 2017-07-08 DIAGNOSIS — J209 Acute bronchitis, unspecified: Secondary | ICD-10-CM | POA: Insufficient documentation

## 2017-07-08 LAB — CBC WITH DIFFERENTIAL/PLATELET
BASOS ABS: 0 10*3/uL (ref 0.0–0.2)
BASOS: 0 %
EOS (ABSOLUTE): 0.1 10*3/uL (ref 0.0–0.4)
Eos: 2 %
HEMATOCRIT: 30.2 % — AB (ref 34.0–46.6)
Hemoglobin: 8.9 g/dL — ABNORMAL LOW (ref 11.1–15.9)
Immature Grans (Abs): 0 10*3/uL (ref 0.0–0.1)
Immature Granulocytes: 0 %
LYMPHS ABS: 2.1 10*3/uL (ref 0.7–3.1)
Lymphs: 39 %
MCH: 21 pg — AB (ref 26.6–33.0)
MCHC: 29.5 g/dL — AB (ref 31.5–35.7)
MCV: 71 fL — AB (ref 79–97)
MONOCYTES: 7 %
Monocytes Absolute: 0.4 10*3/uL (ref 0.1–0.9)
NEUTROS ABS: 2.7 10*3/uL (ref 1.4–7.0)
Neutrophils: 52 %
Platelets: 454 10*3/uL — ABNORMAL HIGH (ref 150–379)
RBC: 4.24 x10E6/uL (ref 3.77–5.28)
RDW: 17.3 % — ABNORMAL HIGH (ref 12.3–15.4)
WBC: 5.4 10*3/uL (ref 3.4–10.8)

## 2017-07-08 LAB — CMP14+EGFR
A/G RATIO: 1.2 (ref 1.2–2.2)
ALK PHOS: 78 IU/L (ref 39–117)
ALT: 10 IU/L (ref 0–32)
AST: 15 IU/L (ref 0–40)
Albumin: 4.4 g/dL (ref 3.5–5.5)
BILIRUBIN TOTAL: 0.3 mg/dL (ref 0.0–1.2)
BUN / CREAT RATIO: 13 (ref 9–23)
BUN: 8 mg/dL (ref 6–20)
CO2: 20 mmol/L (ref 20–29)
Calcium: 9.2 mg/dL (ref 8.7–10.2)
Chloride: 104 mmol/L (ref 96–106)
Creatinine, Ser: 0.6 mg/dL (ref 0.57–1.00)
GFR calc non Af Amer: 116 mL/min/{1.73_m2} (ref >59)
GFR, EST AFRICAN AMERICAN: 134 mL/min/{1.73_m2} (ref >59)
GLOBULIN, TOTAL: 3.6 g/dL (ref 1.5–4.5)
GLUCOSE: 81 mg/dL (ref 65–99)
POTASSIUM: 4.1 mmol/L (ref 3.5–5.2)
SODIUM: 140 mmol/L (ref 134–144)
TOTAL PROTEIN: 8 g/dL (ref 6.0–8.5)

## 2017-07-08 NOTE — Assessment & Plan Note (Addendum)
Established problem  A/ Inadequate control of hidradenitis     Active multiple nodular lesions (?interconnected?) in left submammary, bilateral inguinal and perineum. I believe this may    be Stage III Hur[place] hidradenitis  P/ Referral to specialist in hidradenitis, Dr Buddy Duty, at [facility].      Meantime, start daily low dose doxycycline 100 mg daily.      Baseline labs obtained.  Periodic CMET and CBC while on this doxycycline therapy.

## 2017-07-08 NOTE — Assessment & Plan Note (Signed)
New problem No further workup Suspect post-infectious cough Discussed prolonged cough after viral respiratory infection in some patients.  Nocturnal cough was most distressing to patient, given Robitussion with codeine to take at bedtime as needed for nighttime cough.  For daytime cough, tessalon pearls TID prn. RTC if cough persists beyond 8 weeks.

## 2017-07-08 NOTE — Progress Notes (Signed)
Subjective:    Patient ID: Victoria Rodriguez, female    DOB: 12/22/1980, 37 y.o.   MRN: [id number] Victoria Rodriguez is alone Sources of clinical information for visit is/are patient and past medical records. Nursing assessment for this office visit was reviewed with the patient for accuracy and revision.  Previous Report(s) Reviewed: historical medical records  Depression screen [facility] 2/9 03/25/2017  Decreased Interest 0  Down, Depressed, Hopeless 0  PHQ - 2 Score 0  Altered sleeping -  Tired, decreased energy -  Change in appetite -  Feeling bad or failure about yourself  -  Trouble concentrating -  Moving slowly or fidgety/restless -  Suicidal thoughts -  PHQ-9 Score -   Fall Risk  12/27/2016 07/21/2016 07/19/2016 03/20/2015 11/15/2013  Falls in the past year? 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  Worse with: lying down  Better with: upright posture  Symptoms Sputum:scant, clear  Fever: no  Shortness of breath:no  Leg Swelling:no  Heart Burn or Reflux:no  Wheezing:no  Post Nasal Drip: no  Rhinorrhea: no    Red Flags Weight Loss:  no Immunocompromised:  no  PMH Asthma or COPD: no  PMH of Smoking: no  Using ACEIs: no      Relevant Medications   guaiFENesin-codeine 100-10 MG/5ML syrup   benzonatate (TESSALON) 200 MG capsule     Unprioritized   Chronic Recurrent Hidradenitis Suppurativa - Primary Location: bilateral axilla s/p axillary skin resection bilaterally (repeated) with left occasional drainage; bilateral submammary with left > right drainage; left area with current drainage and "skin knots"; Bilateral groin into perineum and perianal with frequent and current  drainage  Onset: about 10 to 15 years ago    Course: progressive Self-treated with: gentle cleansing               Improvement with treatment: axillary surgical resection, courses of antibiotics, occasional lancing  History Pruritis: no  Tenderness: yes   Red Flags Feeling ill: no, other than current annoying cough  Fever: no  Mouth lesions: no  Facial/tongue swelling/difficulty breathing:  no  Diabetic or immunocompromised: no      Relevant Medications   doxycycline (VIBRA-TABS) 100 MG tablet   Other Relevant Orders   CBC with Differential/Platelet (Completed)   CMP14+EGFR (Completed)   Ambulatory referral to Dermatology    Other Visit Diagnoses    High risk medication use       Relevant Orders   CBC with Differential/Platelet (Completed)   CMP14+EGFR (Completed)     SH: never smoked FH: lives with her two sons, 3 and 8 yrs   Review of Systems See HPI    Objective:   Physical Exam VS reviewed GEN: Alert, Cooperative, Groomed, NAD HEENT: PERRL; EAC bilaterally not occluded, TM's translucent with normal LM, (+) LR;                No cervical LAN, No thyromegaly, No palpable masses COR: RRR, No M/G/R, No JVD, Normal PMI size and location LUNGS: BCTA, No Acc mm use, speaking in full sentences EXT: No peripheral leg edema  SKIN:  Axilla: surgical firm scars bilateral axilla; left axilla with scarring with skin deformities,  no drainage Submammary: skin scarring bilaterally,  nodules in left summary with a one draining clear fluid Inguinal and Peroneal: Skin scarring, multiple nodules in bilateral inguinal and perineum, several nosules draining clear fluid bilaterally  Psych: Normal affect/thought/speech/language   Assessment & Plan:  See problem list

## 2017-08-30 DIAGNOSIS — L732 Hidradenitis suppurativa: Secondary | ICD-10-CM | POA: Diagnosis not present

## 2017-09-14 ENCOUNTER — Other Ambulatory Visit: Payer: Self-pay | Admitting: Surgery

## 2017-09-14 DIAGNOSIS — L732 Hidradenitis suppurativa: Secondary | ICD-10-CM | POA: Diagnosis not present

## 2017-09-15 DIAGNOSIS — L732 Hidradenitis suppurativa: Secondary | ICD-10-CM | POA: Diagnosis not present

## 2017-09-20 ENCOUNTER — Ambulatory Visit (INDEPENDENT_AMBULATORY_CARE_PROVIDER_SITE_OTHER): Payer: Medicare Other | Admitting: Family Medicine

## 2017-09-20 DIAGNOSIS — J209 Acute bronchitis, unspecified: Secondary | ICD-10-CM | POA: Diagnosis present

## 2017-09-20 MED ORDER — BENZONATATE 100 MG PO CAPS: 100.0000 mg | ORAL_CAPSULE | Freq: Two times a day (BID) | ORAL | 0 refills | Status: DC | PRN

## 2017-09-20 NOTE — Assessment & Plan Note (Signed)
Patient's 1 week of cough and nasal congestion without fever, rhonchi on exam, and recent sick contact (daughter) are consistent with acute bronchitis. Non-focal lung findings and absence of SOB or dyspnea make pneumonia unlikely. - Prescribed Tessalon pearls to help with daytime cough and guaifenesin with codeine for cough at night - Counseled patient on return precautions including development of fever, shortness of breath/dyspnea, or worsening of symptoms

## 2017-09-20 NOTE — Patient Instructions (Addendum)
Your cough and congestion are most likely caused by a viral upper respiratory infection. This should get better on its own over the next 4-5 days. We sent in a prescription for 2 different medications to help with the cough. You can take the Tessalon pearls up to 2x a day as needed for cough. You can take the guaifenesin-codeine syrup at night for cough relief. If you develop a fever, start feeling short of breath, or experience worsening of your symptoms, please return to clinic.

## 2017-09-20 NOTE — Progress Notes (Signed)
Date of Visit: 09/20/2017   HPI:  Patient presents for a same day appointment to discuss 1 week of cough and congestion.  Patient first noticed congestion and rhinorrhea about a week ago. This was soon followed by a cough with yellow/green mucus production. The cough is constant throughout the day, and does not seem to be worse at a particular time of the day. She developed body aches on Friday (4/19), but these resolved on Sunday (4/21). She has tried taking Robitussin, Theraflu, and Nyquil with no relief of her symptoms. Her 76-month-old daughter has recently been sick with cough, rhinorrhea, and fever, and she was seen in the emergency department yesterday where a diagnosis of upper respiratory infection was made. She works at a daycare facility. She denies SOB, fever, nausea, vomiting, diarrhea, and orthopnea.   ROS: See HPI  PMH: Acute bronchitis (February 2019), chronic hidradenitis suppurativa PHYSICAL EXAM: BP 118/65 (BP Location: Right Arm, Patient Position: Sitting, Cuff Size: Normal)   Pulse 69   Temp 98.7 F (37.1 C) (Oral)   Wt 193 lb 9.6 oz (87.8 kg)   LMP 09/11/2017 (Exact Date)   SpO2 99%   BMI 34.29 kg/m  Gen: Well-appearing, sitting comfortably in chair HEENT: Moist mucous membranes, no tonsilar erythema or exudates; no tenderness over frontal or maxillary sinuses Heart: RRR, no murmurs/rubs/gallops Lungs: Mild end-expiratory rhonchi bilaterally; normal work of breathing Neuro: Grossly intact  ASSESSMENT/PLAN:  Acute bronchitis Patient's 1 week of cough and nasal congestion without fever, rhonchi on exam, and recent sick contact (daughter) are consistent with acute bronchitis. Non-focal lung findings and absence of SOB or dyspnea make pneumonia unlikely. - Prescribed Tessalon pearls to help with daytime cough and guaifenesin with codeine for cough at night - Counseled patient on return precautions including development of fever, shortness of breath/dyspnea, or  worsening of symptoms   FOLLOW UP: Follow up if you develop shortness of breath, fever, or symptoms suddenly worsen.  I was present during key history and exam and agree with above Lind Covert

## 2017-09-20 NOTE — Progress Notes (Signed)
S 

## 2017-09-23 ENCOUNTER — Emergency Department (HOSPITAL_COMMUNITY)
Admission: EM | Admit: 2017-09-23 | Discharge: 2017-09-23 | Discharge status: Against Medical Advice | Payer: Medicare Other

## 2017-09-23 NOTE — ED Notes (Signed)
Called pt's name to bring back to triage. No answer, triage RN notified.

## 2017-09-23 NOTE — ED Notes (Signed)
Called pt's name for triage, no answer again. Triage RN notified.

## 2017-09-24 ENCOUNTER — Encounter (HOSPITAL_COMMUNITY): Payer: Self-pay | Admitting: Emergency Medicine

## 2017-09-24 ENCOUNTER — Emergency Department (HOSPITAL_COMMUNITY)
Admission: EM | Admit: 2017-09-24 | Discharge: 2017-09-24 | Disposition: A | Discharge status: Home / Self Care | Payer: Medicare Other | Attending: Emergency Medicine | Admitting: Emergency Medicine

## 2017-09-24 DIAGNOSIS — R05 Cough: Secondary | ICD-10-CM | POA: Insufficient documentation

## 2017-09-24 DIAGNOSIS — R42 Dizziness and giddiness: Secondary | ICD-10-CM | POA: Insufficient documentation

## 2017-09-24 DIAGNOSIS — J029 Acute pharyngitis, unspecified: Secondary | ICD-10-CM | POA: Diagnosis not present

## 2017-09-24 DIAGNOSIS — Z5321 Procedure and treatment not carried out due to patient leaving prior to being seen by health care provider: Secondary | ICD-10-CM | POA: Diagnosis not present

## 2017-09-24 DIAGNOSIS — R509 Fever, unspecified: Secondary | ICD-10-CM | POA: Diagnosis not present

## 2017-09-24 DIAGNOSIS — R079 Chest pain, unspecified: Secondary | ICD-10-CM | POA: Diagnosis not present

## 2017-09-24 DIAGNOSIS — R197 Diarrhea, unspecified: Secondary | ICD-10-CM | POA: Diagnosis not present

## 2017-09-24 NOTE — ED Notes (Signed)
Per Peds nurse Pt stated "She would not be seen if her child was admitted, would like to remain at child's side.

## 2017-09-24 NOTE — ED Notes (Signed)
Remains in PEDS with child

## 2017-09-24 NOTE — ED Notes (Signed)
Remains in pediatrics with child.

## 2017-09-24 NOTE — ED Triage Notes (Signed)
Mother reports cough, sore throat, dizziness, fever, chest pain, and diarrhea for x 2 weeks. Decreased appetite reported.  Normal fluid intake reported.  Patient reports coughing up mucus green in color.  Theraflu and nyquil being taken by patient.  Patient reports headaches as well.

## 2017-09-24 NOTE — ED Notes (Signed)
Pt still with peds pt.

## 2017-09-24 NOTE — ED Notes (Signed)
Remains in PEDS

## 2017-09-26 ENCOUNTER — Emergency Department (HOSPITAL_COMMUNITY): Payer: Medicare Other

## 2017-09-26 ENCOUNTER — Ambulatory Visit: Payer: Medicare Other | Admitting: Internal Medicine

## 2017-09-26 ENCOUNTER — Other Ambulatory Visit: Payer: Self-pay

## 2017-09-26 ENCOUNTER — Encounter (HOSPITAL_COMMUNITY): Payer: Self-pay | Admitting: Emergency Medicine

## 2017-09-26 ENCOUNTER — Emergency Department (HOSPITAL_COMMUNITY)
Admission: EM | Admit: 2017-09-26 | Discharge: 2017-09-26 | Disposition: A | Discharge status: Home / Self Care | Payer: Medicare Other | Attending: Emergency Medicine | Admitting: Emergency Medicine

## 2017-09-26 DIAGNOSIS — J069 Acute upper respiratory infection, unspecified: Secondary | ICD-10-CM

## 2017-09-26 DIAGNOSIS — R05 Cough: Secondary | ICD-10-CM | POA: Diagnosis not present

## 2017-09-26 DIAGNOSIS — R079 Chest pain, unspecified: Secondary | ICD-10-CM | POA: Diagnosis not present

## 2017-09-26 DIAGNOSIS — B9789 Other viral agents as the cause of diseases classified elsewhere: Secondary | ICD-10-CM | POA: Diagnosis not present

## 2017-09-26 LAB — I-STAT TROPONIN, ED: TROPONIN I, POC: 0 ng/mL (ref 0.00–0.08)

## 2017-09-26 LAB — BASIC METABOLIC PANEL
ANION GAP: 6 (ref 5–15)
BUN: 6 mg/dL (ref 6–20)
CALCIUM: 8.8 mg/dL — AB (ref 8.9–10.3)
CO2: 25 mmol/L (ref 22–32)
Chloride: 105 mmol/L (ref 101–111)
Creatinine, Ser: 0.55 mg/dL (ref 0.44–1.00)
GFR calc Af Amer: >60 mL/min (ref >60)
GLUCOSE: 92 mg/dL (ref 65–99)
POTASSIUM: 3.4 mmol/L — AB (ref 3.5–5.1)
Sodium: 136 mmol/L (ref 135–145)

## 2017-09-26 LAB — I-STAT BETA HCG BLOOD, ED (MC, WL, AP ONLY): I-stat hCG, quantitative: <5 m[IU]/mL (ref <5)

## 2017-09-26 LAB — CBC
HEMATOCRIT: 29.3 % — AB (ref 36.0–46.0)
HEMOGLOBIN: 8.9 g/dL — AB (ref 12.0–15.0)
MCH: 21.1 pg — ABNORMAL LOW (ref 26.0–34.0)
MCHC: 30.4 g/dL (ref 30.0–36.0)
MCV: 69.4 fL — ABNORMAL LOW (ref 78.0–100.0)
Platelets: 394 10*3/uL (ref 150–400)
RBC: 4.22 MIL/uL (ref 3.87–5.11)
RDW: 17.9 % — ABNORMAL HIGH (ref 11.5–15.5)
WBC: 7.3 10*3/uL (ref 4.0–10.5)

## 2017-09-26 MED ORDER — BENZONATATE 100 MG PO CAPS: 100.0000 mg | ORAL_CAPSULE | Freq: Three times a day (TID) | ORAL | 0 refills | Status: DC

## 2017-09-26 MED ORDER — ACETAMINOPHEN 325 MG PO TABS
650.0000 mg | ORAL_TABLET | Freq: Once | ORAL | Status: AC
  Administered 2017-09-26: 650 mg via ORAL
  Filled 2017-09-26: qty 2

## 2017-09-26 MED ORDER — ALBUTEROL SULFATE HFA 108 (90 BASE) MCG/ACT IN AERS: 2.0000 | INHALATION_SPRAY | RESPIRATORY_TRACT | 0 refills | Status: DC | PRN

## 2017-09-26 NOTE — ED Triage Notes (Signed)
Reports URI symptoms and chest congestion for two weeks and now having right side sharp chest pain for the last two days.  Endorses productive cough with green phlegm.

## 2017-09-26 NOTE — ED Provider Notes (Signed)
[facility] EMERGENCY DEPARTMENT Provider Note   CSN: [id number] Arrival date & time: 09/26/17  [id number]     History   Chief Complaint Chief Complaint  Patient presents with  . URI  . Chest Pain    HPI LACEE GREY is a 37 y.o. female.  37 year old female presents with cough times several days.  Cough is been productive of green sputum.  Notes positive sick exposures with the daughter who was diagnosed with bronchiolitis.  No vomiting or diarrhea.  No anginal or CHF or PE type symptoms.  Symptoms not responsive to over-the-counter medications.  Has had some sharp chest pain is worse with coughing.  No leg pain or swelling.     Past Medical History:  Diagnosis Date  . BPPV (benign paroxysmal positional vertigo) 02/16/2016  . Chronic Recurrent Hidradenitis Suppurativa 04/18/2012   Axillary, submammary and perineal chronic recurrent hidradenitis   . Keratosis punctata 02/29/2012  . Right axillary hidradenitis 01/2017    Patient Active Problem List   Diagnosis Date Noted  . Acute bronchitis 07/08/2017  . BPPV (benign paroxysmal positional vertigo) 02/16/2016  . BMI 40.0-44.9, adult ([place]) 12/23/2015  . Chronic [place] Stage III Hidradenitis Suppurativa 04/18/2012    Past Surgical History:  Procedure Laterality Date  . AXILLARY HIDRADENITIS EXCISION Bilateral 05/15/2007  . AXILLARY HIDRADENITIS EXCISION Left 01/23/2007; 10/25/2007  . AXILLARY HIDRADENITIS EXCISION Right 09/11/2009  . CESAREAN SECTION N/A 01/25/2016   Procedure: CESAREAN SECTION;  Surgeon: Mora Bellman, MD;  Location: [facility];  Service: Obstetrics;  Laterality: N/A;  . HYDRADENITIS EXCISION Left 02/12/2014   Procedure: EXCISION HIDRADENITIS AXILLA;  Surgeon: Coralie Keens, MD;  Location: [facility];  Service: General;  Laterality: Left;  . HYDRADENITIS EXCISION Right 02/08/2017   Procedure: WIDE EXCISION HIDRADENITIS RIGHT  AXILLA;  Surgeon: Coralie Keens,  MD;  Location: [facility];  Service: General;  Laterality: Right;  . IRRIGATION AND DEBRIDEMENT ABSCESS Left 11/16/2013   Procedure: IRRIGATION AND DEBRIDEMENT LEFT AXILLARY ABSCESS;  Surgeon: Pedro Earls, MD;  Location: [facility];  Service: General;  Laterality: Left;  . LAPAROSCOPIC TUBAL LIGATION Bilateral 04/02/2016   Procedure: LAPAROSCOPIC TUBAL LIGATION;  Surgeon: Woodroe Mode, MD;  Location: [facility];  Service: Gynecology;  Laterality: Bilateral;     OB History    Gravida  5   Para  2   Term  2   Preterm  0   AB  3   Living  2     SAB  1   TAB  2   Ectopic      Multiple  0   Live Births  1        Obstetric Comments  2000: 6lbs 9oz TSVD         Home Medications    Prior to Admission medications   Medication Sig Start Date End Date Taking? Authorizing Provider  guaiFENesin-codeine 100-10 MG/5ML syrup Take 10 mLs by mouth at bedtime as needed for cough. 07/07/17  Yes McDiarmid, Blane Ohara, MD  benzonatate (TESSALON) 100 MG capsule Take 1 capsule (100 mg total) by mouth 2 (two) times daily as needed for cough. Patient not taking: Reported on 09/26/2017 09/20/17   Lind Covert, MD    Family History Family History  Problem Relation Age of Onset  . Asthma Mother   . Diabetes Father   . Asthma Sister   . Asthma Brother     Social History Social History   Tobacco  Use  . Smoking status: Never Smoker  . Smokeless tobacco: Never Used  Substance Use Topics  . Alcohol use: No  . Drug use: No     Allergies   Vicodin [hydrocodone-acetaminophen]   Review of Systems Review of Systems  All other systems reviewed and are negative.    Physical Exam Updated Vital Signs BP 117/72   Pulse 95   Temp 99.1 F (37.3 C) (Oral)   Resp 19   Ht 1.6 m (5\' 3" )   Wt 87.5 kg (193 lb)   LMP 09/11/2017 (Exact Date)   SpO2 100%   BMI 34.19 kg/m   Physical Exam  Constitutional: She is oriented to person, place, and time. She appears  well-developed and well-nourished.  Non-toxic appearance. No distress.  HENT:  Head: Normocephalic and atraumatic.  Eyes: Pupils are equal, round, and reactive to light. Conjunctivae, EOM and lids are normal.  Neck: Normal range of motion. Neck supple. No tracheal deviation present. No thyroid mass present.  Cardiovascular: Normal rate, regular rhythm and normal heart sounds. Exam reveals no gallop.  No murmur heard. Pulmonary/Chest: Effort normal. No stridor. No respiratory distress. She has decreased breath sounds. She has no wheezes. She has no rhonchi. She has no rales.  Abdominal: Soft. Normal appearance and bowel sounds are normal. She exhibits no distension. There is no tenderness. There is no rebound and no CVA tenderness.  Musculoskeletal: Normal range of motion. She exhibits no edema or tenderness.  Neurological: She is alert and oriented to person, place, and time. She has normal strength. No cranial nerve deficit or sensory deficit. GCS eye subscore is 4. GCS verbal subscore is 5. GCS motor subscore is 6.  Skin: Skin is warm and dry. No abrasion and no rash noted.  Psychiatric: She has a normal mood and affect. Her speech is normal and behavior is normal.  Nursing note and vitals reviewed.    ED Treatments / Results  Labs (all labs ordered are listed, but only abnormal results are displayed) Labs Reviewed  BASIC METABOLIC PANEL - Abnormal; Notable for the following components:      Result Value   Potassium 3.4 (*)    Calcium 8.8 (*)    All other components within normal limits  CBC - Abnormal; Notable for the following components:   Hemoglobin 8.9 (*)    HCT 29.3 (*)    MCV 69.4 (*)    MCH 21.1 (*)    RDW 17.9 (*)    All other components within normal limits  I-STAT TROPONIN, ED  I-STAT BETA HCG BLOOD, ED ([facility], [facility], [facility] ONLY)    EKG EKG Interpretation  Date/Time:  Monday September 26 2017 06:47:29 EDT Ventricular Rate:  93 PR Interval:  144 QRS Duration: 88 QT  Interval:  362 QTC Calculation: 450 R Axis:   78 Text Interpretation:  Normal sinus rhythm Normal ECG Confirmed by Lacretia Leigh ([id number]) on 09/26/2017 12:53:41 PM   Radiology Dg Chest 2 View  Result Date: 09/26/2017 CLINICAL DATA:  Chest pain. EXAM: CHEST - 2 VIEW COMPARISON:  Radiographs of May 15, 2016. FINDINGS: The heart size and mediastinal contours are within normal limits. Both lungs are clear. No pneumothorax or pleural effusion is noted. The visualized skeletal structures are unremarkable. IMPRESSION: No active cardiopulmonary disease. Electronically Signed   By: Marijo Conception, M.D.   On: 09/26/2017 07:29    Procedures Procedures (including critical care time)  Medications Ordered in ED Medications - No data to display  Initial Impression / Assessment and Plan / ED Course  I have reviewed the triage vital signs and the nursing notes.  Pertinent labs & imaging results that were available during my care of the patient were reviewed by me and considered in my medical decision making (see chart for details).    Patient with negative chest x-ray here.  Troponin negative.  EKG without ischemic changes.  Suspect viral URI and patient be discharged with cough medication as well as an inhaler.   Final Clinical Impressions(s) / ED Diagnoses   Final diagnoses:  None    ED Discharge Orders    None       Lacretia Leigh, MD 09/26/17 1258

## 2017-09-28 ENCOUNTER — Ambulatory Visit (INDEPENDENT_AMBULATORY_CARE_PROVIDER_SITE_OTHER): Payer: Medicare Other | Admitting: Family Medicine

## 2017-09-28 ENCOUNTER — Encounter: Payer: Self-pay | Admitting: Family Medicine

## 2017-09-28 ENCOUNTER — Other Ambulatory Visit: Payer: Self-pay

## 2017-09-28 VITALS — BP 102/60 | HR 100 | Temp 98.2°F | Ht 63.0 in | Wt 187.6 lb

## 2017-09-28 DIAGNOSIS — J209 Acute bronchitis, unspecified: Secondary | ICD-10-CM

## 2017-09-28 DIAGNOSIS — R300 Dysuria: Secondary | ICD-10-CM

## 2017-09-28 DIAGNOSIS — R058 Other specified cough: Secondary | ICD-10-CM | POA: Insufficient documentation

## 2017-09-28 DIAGNOSIS — B3731 Acute candidiasis of vulva and vagina: Secondary | ICD-10-CM

## 2017-09-28 DIAGNOSIS — R05 Cough: Secondary | ICD-10-CM | POA: Diagnosis not present

## 2017-09-28 DIAGNOSIS — B373 Candidiasis of vulva and vagina: Secondary | ICD-10-CM | POA: Diagnosis not present

## 2017-09-28 LAB — POCT URINALYSIS DIP (MANUAL ENTRY)
Bilirubin, UA: NEGATIVE
Glucose, UA: NEGATIVE mg/dL
NITRITE UA: NEGATIVE
PH UA: 7 (ref 5.0–8.0)
Protein Ur, POC: 30 mg/dL — AB
Spec Grav, UA: 1.02 (ref 1.010–1.025)
Urobilinogen, UA: 1 E.U./dL

## 2017-09-28 LAB — POCT UA - MICROSCOPIC ONLY

## 2017-09-28 MED ORDER — HYDROCODONE-HOMATROPINE 5-1.5 MG/5ML PO SYRP
5.0000 mL | ORAL_SOLUTION | Freq: Three times a day (TID) | ORAL | 0 refills | Status: DC | PRN
Start: 2017-09-28 — End: 2017-11-21

## 2017-09-28 NOTE — Progress Notes (Signed)
Date of Visit: 09/28/2017   HPI:  Patient presents for a same day appointment to discuss cough and congestion.  Cough/Congestion:  - Cough started ~2 weeks ago; patient seen in clinic on 4/23, diagnosed with acute bronchitis, for which Tessalon pearls and guafenesin-codeine cough syrup were prescribed; neither of these has provided any relief from cough - Symptoms improve slightly about a week ago, then worsened again over past week - Presented to E.D. on 09/26/17 for cough/congestion due to new-onset right-sided chest pain; CXR unremarkable, and patient discharged w/ albuterol MDI and Tessalon pearls - Cough is productive of yellow/green mucus - Daughter has been ill with fever, cough, nasal congestion and was hospitalized over weekend due to respiratory symptoms - New onset, episodic RLQ pain over past 2-3 days; ibuprofen provides some relief from pain; endorses feeling of "irritation" after urinating, but denies frequency, urgency - Denies fever, chills, body aches, nausea, vomiting, diarrhea - Works in daycare   ROS: See HPI  [place]: Acute bronchitis (February 2019, 09/20/17)  PHYSICAL EXAM: BP 102/60   Pulse 100   Temp 98.2 F (36.8 C) (Oral)   Ht 5\' 3"  (1.6 m)   Wt 187 lb 9.6 oz (85.1 kg)   LMP 09/11/2017 (Exact Date)   SpO2 97%   BMI 33.23 kg/m    Gen: Well-appearing, sitting comfortably in chair with occasional paroxysms of coughing HEENT: moist mucous membranes; no tonsillar erythema, exudates Heart: RRR, no murmurs/rubs/gallops Lungs: CTAB, normal work of breathing Abdomen: no CVA tenderness Neuro: grossly intact   ASSESSMENT/PLAN:  Post-viral cough syndrome HPI is consistent with 2 back-to-back viral respiratory illnesses that likely caused damage to airway lining, resulting in post-viral cough syndrome. - Prescribed Hycodan cough syrup; advised patient that medication is not covered by Medicaid and that she can choose whether or not to pick it up based on cost -  Recommended OTC dextromethorphan-containing products as an alternative to prescription cough syrup; also recommended honey as another potential cough remedy - Counseled patient to expect cough to slowly improve over next ~6 weeks as airway lining heals - Provided return precautions, including acute worsening of symptoms or development of shortness of breath, fever, chills   Vaginal yeast infection Point-of-care UA microscopy revealed yeast. - Patient instructed to take Diflucan tablet she has from previous yeast infection

## 2017-09-28 NOTE — Patient Instructions (Signed)
I think you have had back to back infections that have damaged the lining of your breathing tubes.  It may take 6 weeks for the cough to go away.  You should do nothing but get better.  If you get worse, please let us see you again.  I sent in a prescription cough medicine that Medicaid probably won't cover. You decide whether it is worth picking up.  The over the counter Dextromethorphan containing products (like Robitussin DM) are supposed to be as good as codeine in keeping down cough.  Don't forget to take the pill for the yeast infection.  I hope we don't see you any time soon - that will mean that you got better    Give honey a try for the cough.

## 2017-09-28 NOTE — Assessment & Plan Note (Addendum)
HPI is consistent with 2 back-to-back viral respiratory illnesses that likely caused damage to airway lining, resulting in post-viral cough syndrome. - Prescribed Hycodan cough syrup; advised patient that medication is not covered by Medicaid and that she can choose whether or not to pick it up based on cost - Recommended OTC dextromethorphan-containing products as an alternative to prescription cough syrup; also recommended honey as another potential cough remedy - Counseled patient to expect cough to slowly improve over next ~6 weeks as airway lining heals - Provided return precautions, including acute worsening of symptoms or development of shortness of breath, fever, chills

## 2017-09-28 NOTE — Assessment & Plan Note (Addendum)
Point-of-care UA microscopy revealed yeast. - Patient instructed to take Diflucan tablet she has from previous yeast infection

## 2017-09-29 ENCOUNTER — Encounter: Payer: Self-pay | Admitting: Family Medicine

## 2017-09-30 NOTE — Pre-Procedure Instructions (Signed)
Victoria Rodriguez  09/30/2017      [id number] - [place], [place] AT [address] [place] [address] Phone: [phone] Fax: [phone]    Your procedure is scheduled on Tuesday May 14.  Report to [facility] Admitting at 8:00 A.M.  Call this number if you have problems the morning of surgery:  [phone]   Remember:  Do not eat food or drink liquids after midnight.  **Please drink Ensure Pre-surgery drink prior to leaving home the morning of surgery**   Take these medicines the morning of surgery with A SIP OF WATER:   Albuterol if needed (please bring inhaler to hospital with you) Benzonatate (Tessalon) if needed  7 days prior to surgery STOP taking any Aspirin(unless otherwise instructed by your surgeon), Aleve, Naproxen, Ibuprofen, Motrin, Advil, Goody's, BC's, all herbal medications, fish oil, and all vitamins    Do not wear jewelry, make-up or nail polish.  Do not wear lotions, powders, or perfumes, or deodorant.  Do not shave 48 hours prior to surgery.  Men may shave face and neck.  Do not bring valuables to the hospital.  [facility] is not responsible for any belongings or valuables.  Contacts, dentures or bridgework may not be worn into surgery.  Leave your suitcase in the car.  After surgery it may be brought to your room.  For patients admitted to the hospital, discharge time will be determined by your treatment team.  Patients discharged the day of surgery will not be allowed to drive home.   Special instructions:    [place]- Preparing For Surgery  Before surgery, you can play an important role. Because skin is not sterile, your skin needs to be as free of germs as possible. You can reduce the number of germs on your skin by washing with CHG (chlorahexidine gluconate) Soap before surgery.  CHG is an antiseptic cleaner which kills germs and  bonds with the skin to continue killing germs even after washing.  Please do not use if you have an allergy to CHG or antibacterial soaps. If your skin becomes reddened/irritated stop using the CHG.  Do not shave (including legs and underarms) for at least 48 hours prior to first CHG shower. It is OK to shave your face.  Please follow these instructions carefully.   1. Shower the NIGHT BEFORE SURGERY and the MORNING OF SURGERY with CHG.   2. If you chose to wash your hair, wash your hair first as usual with your normal shampoo.  3. After you shampoo, rinse your hair and body thoroughly to remove the shampoo.  4. Use CHG as you would any other liquid soap. You can apply CHG directly to the skin and wash gently with a scrungie or a clean washcloth.   5. Apply the CHG Soap to your body ONLY FROM THE NECK DOWN.  Do not use on open wounds or open sores. Avoid contact with your eyes, ears, mouth and genitals (private parts). Wash Face and genitals (private parts)  with your normal soap.  6. Wash thoroughly, paying special attention to the area where your surgery will be performed.  7. Thoroughly rinse your body with warm water from the neck down.  8. DO NOT shower/wash with your normal soap after using and rinsing off the CHG Soap.  9. Pat yourself dry with a CLEAN TOWEL.  10. Wear CLEAN PAJAMAS to bed  the night before surgery, wear comfortable clothes the morning of surgery  11. Place CLEAN SHEETS on your bed the night of your first shower and DO NOT SLEEP WITH PETS.    Day of Surgery: Do not apply any deodorants/lotions. Please wear clean clothes to the hospital/surgery center.      Please read over the following fact sheets that you were given. Coughing and Deep Breathing and Surgical Site Infection Prevention

## 2017-10-03 ENCOUNTER — Encounter (HOSPITAL_COMMUNITY): Payer: Self-pay

## 2017-10-03 ENCOUNTER — Other Ambulatory Visit: Payer: Self-pay

## 2017-10-03 ENCOUNTER — Encounter (HOSPITAL_COMMUNITY)
Admission: RE | Admit: 2017-10-03 | Discharge: 2017-10-03 | Disposition: A | Discharge status: Home / Self Care | Payer: Medicare Other | Source: Ambulatory Visit | Attending: Surgery | Admitting: Surgery

## 2017-10-03 DIAGNOSIS — Z01812 Encounter for preprocedural laboratory examination: Secondary | ICD-10-CM | POA: Diagnosis not present

## 2017-10-03 LAB — BASIC METABOLIC PANEL
Anion gap: 8 (ref 5–15)
BUN: 9 mg/dL (ref 6–20)
CALCIUM: 9 mg/dL (ref 8.9–10.3)
CHLORIDE: 106 mmol/L (ref 101–111)
CO2: 25 mmol/L (ref 22–32)
CREATININE: 0.55 mg/dL (ref 0.44–1.00)
GFR calc Af Amer: >60 mL/min (ref >60)
GFR calc non Af Amer: >60 mL/min (ref >60)
Glucose, Bld: 80 mg/dL (ref 65–99)
Potassium: 3.7 mmol/L (ref 3.5–5.1)
Sodium: 139 mmol/L (ref 135–145)

## 2017-10-03 LAB — CBC
HEMATOCRIT: 29.5 % — AB (ref 36.0–46.0)
Hemoglobin: 8.7 g/dL — ABNORMAL LOW (ref 12.0–15.0)
MCH: 20.7 pg — AB (ref 26.0–34.0)
MCHC: 29.5 g/dL — AB (ref 30.0–36.0)
MCV: 70.2 fL — ABNORMAL LOW (ref 78.0–100.0)
Platelets: 486 10*3/uL — ABNORMAL HIGH (ref 150–400)
RBC: 4.2 MIL/uL (ref 3.87–5.11)
RDW: 18.5 % — AB (ref 11.5–15.5)
WBC: 5 10*3/uL (ref 4.0–10.5)

## 2017-10-03 NOTE — Progress Notes (Signed)
PCP - [facility] Cardiologist - denies  Chest x-ray - 09/26/17 EKG - 09/27/17  Anesthesia review: Pt with cough, states she has been sick since 09/13/17. Pt reports she did have green mucus, now mucus is white per patient. Pt reports no chest pain or SOB, nasal congestion as well per patient. Willeen Cass with Anesthesia notified to come see patient at PAT appointment.   Patient denies shortness of breath, fever, and chest pain at PAT appointment  Patient verbalized understanding of instructions that were given to them at the PAT appointment. Patient was also instructed that they will need to review over the PAT instructions again at home before surgery.

## 2017-10-04 NOTE — Progress Notes (Signed)
Anesthesia Consult:   Case:  [id number] Date/Time:  10/11/17 0945   Procedure:  WIDE EXCISION HIDRADENITIS LEFT AXILLA ERAS PATHWAY (Left )   Anesthesia type:  General   Pre-op diagnosis:  Hidradenitis   Location:  [facility] OR ROOM 02 / [place] [facility]   Surgeon:  Coralie Keens, MD      DISCUSSION:  - Pt is a 37 year old female   - Was seen in ED 09/26/17 for viral URI with cough.  Pt reports she has been sick for approximately a month.  Initially she felt usual cold symptoms.  Almost all symptoms have completely resolved except still has occasional cough and has some nasal congestion.  Occasionally blows clear drainage from nose or coughs up clear drainage.  No fever. No longer feels as if she is ill, but still has these symptoms.  Denies history of seasonal allergies.  On exam, pt is well-appearing; HRRR, lungs CTA B.  Has occasional dry cough.  I do not have equipment for doing ENT exam.  Instructed pt to try cold medicine or antihistamine to manage sx.  If she is still having symptoms by the end of the week, I asked her to notify surgeon and see her PCP.    VS: BP 125/73   Pulse 72   Temp 36.8 C   Resp 20   Ht 5\' 3"  (1.6 m)   Wt 185 lb 11.2 oz (84.2 kg)   LMP 09/11/2017 (Exact Date)   SpO2 100%   BMI 32.90 kg/m   PROVIDERS: McDiarmid, Blane Ohara, MD   LABS:  - H/H 8.7/29.5  (all labs ordered are listed, but only abnormal results are displayed)  Labs Reviewed  CBC - Abnormal; Notable for the following components:      Result Value   Hemoglobin 8.7 (*)    HCT 29.5 (*)    MCV 70.2 (*)    MCH 20.7 (*)    MCHC 29.5 (*)    RDW 18.5 (*)    Platelets 486 (*)    All other components within normal limits  BASIC METABOLIC PANEL     IMAGES:  CXR 09/26/17: No active cardiopulmonary disease   EKG 09/26/17: NSR    Past Medical History:  Diagnosis Date  . BPPV (benign paroxysmal positional vertigo) 02/16/2016  . Chronic Recurrent Hidradenitis Suppurativa 04/18/2012   Axillary,  submammary and perineal chronic recurrent hidradenitis   . Keratosis punctata 02/29/2012  . Right axillary hidradenitis 01/2017    Past Surgical History:  Procedure Laterality Date  . AXILLARY HIDRADENITIS EXCISION Bilateral 05/15/2007  . AXILLARY HIDRADENITIS EXCISION Left 01/23/2007; 10/25/2007  . AXILLARY HIDRADENITIS EXCISION Right 09/11/2009  . CESAREAN SECTION N/A 01/25/2016   Procedure: CESAREAN SECTION;  Surgeon: Mora Bellman, MD;  Location: [place];  Service: Obstetrics;  Laterality: N/A;  . HYDRADENITIS EXCISION Left 02/12/2014   Procedure: EXCISION HIDRADENITIS AXILLA;  Surgeon: Coralie Keens, MD;  Location: [place];  Service: General;  Laterality: Left;  . HYDRADENITIS EXCISION Right 02/08/2017   Procedure: WIDE EXCISION HIDRADENITIS RIGHT  AXILLA;  Surgeon: Coralie Keens, MD;  Location: [place];  Service: General;  Laterality: Right;  . IRRIGATION AND DEBRIDEMENT ABSCESS Left 11/16/2013   Procedure: IRRIGATION AND DEBRIDEMENT LEFT AXILLARY ABSCESS;  Surgeon: Pedro Earls, MD;  Location: [facility];  Service: General;  Laterality: Left;  . LAPAROSCOPIC TUBAL LIGATION Bilateral 04/02/2016   Procedure: LAPAROSCOPIC TUBAL LIGATION;  Surgeon: Woodroe Mode, MD;  Location: [facility] [facility];  Service: Gynecology;  Laterality: Bilateral;  . TUBAL LIGATION      MEDICATIONS: . albuterol (PROVENTIL HFA;VENTOLIN HFA) 108 (90 Base) MCG/ACT inhaler  . benzonatate (TESSALON) 100 MG capsule  . benzonatate (TESSALON) 100 MG capsule  . HYDROcodone-homatropine (HYCODAN) 5-1.5 MG/5ML syrup   No current facility-administered medications for this encounter.     If no signs/sx acute illness, I anticipate pt can proceed with surgery as scheduled.   Willeen Cass, FNP-BC [facility] Stay [facility]/Anesthesiology Phone: [phone] 10/04/2017 2:20 PM

## 2017-10-10 NOTE — H&P (Signed)
  [name]  Location: [facility] Surgery Patient #: [id number] DOB: 1980-06-14 Single / Language: [name] / Race: Black or African American Female   History of Present Illness (Bren Borys A. Ninfa Linden MD; The patient is a 37 year old female who presents with a complaint of Hidradenitis. She is here for another evaluation of her hidradenitis. She recently had an inflammation and infection in the left axilla. She was placed on doxycycline and reports improvement. She has multiple draining areas in the left axilla. The last time I performed wide excision on the left axilla was 2015. I operated on her right axilla in 2018 and she has done well from this standpoint. She also reports now having multiple draining areas in both her inguinal creases   Allergies Alean Rinne, RMA;  Vicodin *ANALGESICS - OPIOID*  Allergies Reconciled   Medication History Alean Rinne, RMA; Clindamycin Phosphate (1% Gel, 1 (one) Application External two times daily, Taken starting 04/29/2017) Active. Doxycycline Hyclate (100MG  Tablet, 1 (one) Oral two times daily, Taken starting 09/06/2017) Active. Medications Reconciled  Vitals Mardene Celeste King RMA  Weight: 194 lb Height: 63in Body Surface Area: 1.91 m Body Mass Index: 34.37 kg/m  Temp.: 100.71F  Pulse: 79 (Regular)  BP: 130/82 (Sitting, Left Arm, Standard)    Physical Exam (Lella Mullany A. Ninfa Linden MD;  The physical exam findings are as follows: Note:She is well in appearance Lungs are clear bilaterally Cardiovascular is regular rate and rhythm The right axilla is well healed, the left axilla shows at least for chronic draining sinus tracts. She has multiple areas in both her inguinal creases consistent with hidradenitis    Assessment & Plan  HIDRADENITIS AXILLARIS (L73.2)  Impression: We again discussed her disease process in detail. She again understands this is not a curable disease. Because of the multiple draining  sinus areas, reexcision of the left axilla was recommended. I also discussed excision of the hidradenitis both her inguinal creases. After long discussion, we elected to do this as a stage procedure saw do just the left axilla first. We discussed the risk of the surgery which includes but is not limited to bleeding, infection, injury to surrounding structures, having a chronic open wound, need for further procedures, etc. She understands and wishes to proceed with surgery which will be scheduled

## 2017-10-11 ENCOUNTER — Encounter (HOSPITAL_COMMUNITY)
Admission: RE | Disposition: A | Discharge status: Home / Self Care | Payer: Self-pay | Source: Ambulatory Visit | Attending: Surgery

## 2017-10-11 ENCOUNTER — Ambulatory Visit (HOSPITAL_COMMUNITY): Payer: Medicare Other | Admitting: Anesthesiology

## 2017-10-11 ENCOUNTER — Encounter (HOSPITAL_COMMUNITY): Payer: Self-pay | Admitting: Certified Registered Nurse Anesthetist

## 2017-10-11 ENCOUNTER — Ambulatory Visit (HOSPITAL_COMMUNITY): Payer: Medicare Other | Admitting: Emergency Medicine

## 2017-10-11 ENCOUNTER — Other Ambulatory Visit: Payer: Self-pay

## 2017-10-11 ENCOUNTER — Ambulatory Visit (HOSPITAL_COMMUNITY)
Admission: RE | Admit: 2017-10-11 | Discharge: 2017-10-11 | Disposition: A | Discharge status: Home / Self Care | Payer: Medicare Other | Source: Ambulatory Visit | Attending: Surgery | Admitting: Surgery

## 2017-10-11 DIAGNOSIS — L732 Hidradenitis suppurativa: Secondary | ICD-10-CM | POA: Diagnosis not present

## 2017-10-11 HISTORY — PX: HYDRADENITIS EXCISION: SHX5243

## 2017-10-11 LAB — POCT PREGNANCY, URINE: Preg Test, Ur: NEGATIVE

## 2017-10-11 SURGERY — EXCISION, HIDRADENITIS, AXILLA
Anesthesia: General | Site: Axilla | Laterality: Left

## 2017-10-11 MED ORDER — MIDAZOLAM HCL 2 MG/2ML IJ SOLN
INTRAMUSCULAR | Status: AC
  Filled 2017-10-11: qty 2

## 2017-10-11 MED ORDER — LIDOCAINE 2% (20 MG/ML) 5 ML SYRINGE
INTRAMUSCULAR | Status: AC
  Filled 2017-10-11: qty 5

## 2017-10-11 MED ORDER — ONDANSETRON HCL 4 MG/2ML IJ SOLN
INTRAMUSCULAR | Status: AC
  Filled 2017-10-11: qty 2

## 2017-10-11 MED ORDER — PROPOFOL 10 MG/ML IV BOLUS
INTRAVENOUS | Status: AC
  Filled 2017-10-11: qty 20

## 2017-10-11 MED ORDER — CEFAZOLIN SODIUM-DEXTROSE 2-4 GM/100ML-% IV SOLN
2.0000 g | INTRAVENOUS | Status: AC
  Administered 2017-10-11: 2 g via INTRAVENOUS
  Filled 2017-10-11: qty 100

## 2017-10-11 MED ORDER — GABAPENTIN 300 MG PO CAPS
300.0000 mg | ORAL_CAPSULE | ORAL | Status: AC
  Administered 2017-10-11: 300 mg via ORAL
  Filled 2017-10-11: qty 1

## 2017-10-11 MED ORDER — PROPOFOL 10 MG/ML IV BOLUS
INTRAVENOUS | Status: DC | PRN
  Administered 2017-10-11: 170 mg via INTRAVENOUS
  Administered 2017-10-11: 30 mg via INTRAVENOUS

## 2017-10-11 MED ORDER — DEXAMETHASONE SODIUM PHOSPHATE 10 MG/ML IJ SOLN
INTRAMUSCULAR | Status: DC | PRN
  Administered 2017-10-11: 10 mg via INTRAVENOUS

## 2017-10-11 MED ORDER — OXYCODONE HCL 5 MG PO TABS: 5.0000 mg | ORAL_TABLET | Freq: Once | ORAL | Status: DC

## 2017-10-11 MED ORDER — CELECOXIB 200 MG PO CAPS
200.0000 mg | ORAL_CAPSULE | ORAL | Status: AC
Start: 2017-10-11 — End: 2017-10-11
  Administered 2017-10-11: 200 mg via ORAL
  Filled 2017-10-11: qty 1

## 2017-10-11 MED ORDER — ACETAMINOPHEN 500 MG PO TABS
1000.0000 mg | ORAL_TABLET | ORAL | Status: AC
  Administered 2017-10-11: 1000 mg via ORAL
  Filled 2017-10-11: qty 2

## 2017-10-11 MED ORDER — DEXAMETHASONE SODIUM PHOSPHATE 10 MG/ML IJ SOLN
INTRAMUSCULAR | Status: AC
Start: 2017-10-11 — End: ?
  Filled 2017-10-11: qty 1

## 2017-10-11 MED ORDER — SUCCINYLCHOLINE CHLORIDE 200 MG/10ML IV SOSY
PREFILLED_SYRINGE | INTRAVENOUS | Status: AC
  Filled 2017-10-11: qty 10

## 2017-10-11 MED ORDER — BUPIVACAINE-EPINEPHRINE (PF) 0.25% -1:200000 IJ SOLN
INTRAMUSCULAR | Status: AC
  Filled 2017-10-11: qty 30

## 2017-10-11 MED ORDER — BUPIVACAINE-EPINEPHRINE 0.25% -1:200000 IJ SOLN
INTRAMUSCULAR | Status: DC | PRN
  Administered 2017-10-11: 20 mL

## 2017-10-11 MED ORDER — DEXAMETHASONE SODIUM PHOSPHATE 10 MG/ML IJ SOLN
INTRAMUSCULAR | Status: AC
  Filled 2017-10-11: qty 1

## 2017-10-11 MED ORDER — MIDAZOLAM HCL 5 MG/5ML IJ SOLN
INTRAMUSCULAR | Status: DC | PRN
  Administered 2017-10-11: 2 mg via INTRAVENOUS

## 2017-10-11 MED ORDER — CHLORHEXIDINE GLUCONATE CLOTH 2 % EX PADS: 6.0000 | MEDICATED_PAD | Freq: Once | CUTANEOUS | Status: DC

## 2017-10-11 MED ORDER — LACTATED RINGERS IV SOLN
INTRAVENOUS | Status: DC
  Administered 2017-10-11: 09:00:00 via INTRAVENOUS

## 2017-10-11 MED ORDER — 0.9 % SODIUM CHLORIDE (POUR BTL) OPTIME
TOPICAL | Status: DC | PRN
  Administered 2017-10-11: 1000 mL

## 2017-10-11 MED ORDER — FENTANYL CITRATE (PF) 100 MCG/2ML IJ SOLN
INTRAMUSCULAR | Status: DC | PRN
  Administered 2017-10-11 (×3): 50 ug via INTRAVENOUS

## 2017-10-11 MED ORDER — ONDANSETRON HCL 4 MG/2ML IJ SOLN
INTRAMUSCULAR | Status: DC | PRN
  Administered 2017-10-11: 4 mg via INTRAVENOUS

## 2017-10-11 MED ORDER — OXYCODONE HCL 5 MG PO TABS: 5.0000 mg | ORAL_TABLET | Freq: Four times a day (QID) | ORAL | 0 refills | Status: DC | PRN

## 2017-10-11 MED ORDER — FENTANYL CITRATE (PF) 100 MCG/2ML IJ SOLN: 25.0000 ug | INTRAMUSCULAR | Status: DC | PRN

## 2017-10-11 MED ORDER — LIDOCAINE 2% (20 MG/ML) 5 ML SYRINGE
INTRAMUSCULAR | Status: DC | PRN
  Administered 2017-10-11: 60 mg via INTRAVENOUS

## 2017-10-11 MED ORDER — ONDANSETRON HCL 4 MG/2ML IJ SOLN
4.0000 mg | Freq: Once | INTRAMUSCULAR | Status: AC | PRN
  Administered 2017-10-11: 4 mg via INTRAVENOUS

## 2017-10-11 MED ORDER — FENTANYL CITRATE (PF) 250 MCG/5ML IJ SOLN
INTRAMUSCULAR | Status: AC
  Filled 2017-10-11: qty 5

## 2017-10-11 SURGICAL SUPPLY — 31 items
CANISTER SUCT 3000ML PPV (MISCELLANEOUS) ×2 IMPLANT
CHLORAPREP W/TINT 26ML (MISCELLANEOUS) ×2 IMPLANT
COVER SURGICAL LIGHT HANDLE (MISCELLANEOUS) ×2 IMPLANT
DECANTER SPIKE VIAL GLASS SM (MISCELLANEOUS) ×2 IMPLANT
DERMABOND ADVANCED (GAUZE/BANDAGES/DRESSINGS)
DERMABOND ADVANCED .7 DNX12 (GAUZE/BANDAGES/DRESSINGS) IMPLANT
DRAPE LAPAROSCOPIC ABDOMINAL (DRAPES) IMPLANT
DRAPE LAPAROTOMY 100X72 PEDS (DRAPES) ×2 IMPLANT
ELECT CAUTERY BLADE 6.4 (BLADE) ×2 IMPLANT
ELECT REM PT RETURN 9FT ADLT (ELECTROSURGICAL) ×2
ELECTRODE REM PT RTRN 9FT ADLT (ELECTROSURGICAL) ×1 IMPLANT
GAUZE SPONGE 4X4 12PLY STRL (GAUZE/BANDAGES/DRESSINGS) ×2 IMPLANT
GLOVE SURG SIGNA 7.5 PF LTX (GLOVE) ×2 IMPLANT
GOWN STRL REUS W/ TWL LRG LVL3 (GOWN DISPOSABLE) IMPLANT
GOWN STRL REUS W/ TWL XL LVL3 (GOWN DISPOSABLE) ×2 IMPLANT
GOWN STRL REUS W/TWL LRG LVL3 (GOWN DISPOSABLE)
GOWN STRL REUS W/TWL XL LVL3 (GOWN DISPOSABLE) ×2
KIT BASIN OR (CUSTOM PROCEDURE TRAY) ×2 IMPLANT
KIT TURNOVER KIT B (KITS) ×2 IMPLANT
NEEDLE HYPO 25GX1X1/2 BEV (NEEDLE) ×2 IMPLANT
NS IRRIG 1000ML POUR BTL (IV SOLUTION) ×2 IMPLANT
PACK GENERAL/GYN (CUSTOM PROCEDURE TRAY) ×2 IMPLANT
PAD ARMBOARD 7.5X6 YLW CONV (MISCELLANEOUS) ×2 IMPLANT
SPECIMEN JAR MEDIUM (MISCELLANEOUS) IMPLANT
SUT ETHILON 2 0 FS 18 (SUTURE) ×6 IMPLANT
SUT ETHILON 3 0 FSL (SUTURE) IMPLANT
SUT MNCRL AB 4-0 PS2 18 (SUTURE) ×2 IMPLANT
SUT VIC AB 3-0 SH 18 (SUTURE) ×2 IMPLANT
SYR CONTROL 10ML LL (SYRINGE) ×2 IMPLANT
TOWEL OR 17X24 6PK STRL BLUE (TOWEL DISPOSABLE) ×2 IMPLANT
TOWEL OR 17X26 10 PK STRL BLUE (TOWEL DISPOSABLE) ×2 IMPLANT

## 2017-10-11 NOTE — Discharge Instructions (Signed)
Ok to shower starting tomorrow  Expect drainage from the wound  No vigorous activity for 2 weeks  Change bandages as needed  Ice pack, tylenol, ibuprofen also for pain

## 2017-10-11 NOTE — Transfer of Care (Signed)
Immediate Anesthesia Transfer of Care Note  Patient: Victoria Rodriguez  Procedure(s) Performed: WIDE EXCISION HIDRADENITIS LEFT AXILLA ERAS PATHWAY (Left Axilla)  Patient Location: PACU  Anesthesia Type:General  Level of Consciousness: patient cooperative and responds to stimulation  Airway & Oxygen Therapy: Patient Spontanous Breathing and Patient connected to nasal cannula oxygen  Post-op Assessment: Report given to RN and Post -op Vital signs reviewed and stable  Post vital signs: Reviewed and stable  Last Vitals:  Vitals Value Taken Time  BP 138/77 10/11/2017 10:11 AM  Temp    Pulse 96 10/11/2017 10:13 AM  Resp 19 10/11/2017 10:13 AM  SpO2 98 % 10/11/2017 10:13 AM  Vitals shown include unvalidated device data.  Last Pain:  Vitals:   10/11/17 1010  TempSrc:   PainSc: (P) 0-No pain      Patients Stated Pain Goal: 2 (04/01/10 1735)  Complications: No apparent anesthesia complications

## 2017-10-11 NOTE — Anesthesia Preprocedure Evaluation (Signed)

## 2017-10-11 NOTE — Interval H&P Note (Signed)
History and Physical Interval Note: no change in H and P  10/11/2017 9:20 AM  Victoria Rodriguez  has presented today for surgery, with the diagnosis of Hidradenitis  The various methods of treatment have been discussed with the patient and family. After consideration of risks, benefits and other options for treatment, the patient has consented to  Procedure(s): WIDE EXCISION HIDRADENITIS LEFT AXILLA ERAS PATHWAY (Left) as a surgical intervention .  The patient's history has been reviewed, patient examined, no change in status, stable for surgery.  I have reviewed the patient's chart and labs.  Questions were answered to the patient's satisfaction.     [name] A

## 2017-10-11 NOTE — Op Note (Signed)
WIDE EXCISION HIDRADENITIS LEFT AXILLA ERAS PATHWAY  Procedure Note  Victoria Rodriguez 10/11/2017   Pre-op Diagnosis: HIDRADENITIS LEFT AXILLA     Post-op Diagnosis: same  Procedure(s): 18 cm square WIDE EXCISION HIDRADENITIS LEFT AXILLA ERAS PATHWAY  Surgeon(s): Coralie Keens, MD  Anesthesia: General  Staff:  Circulator: Paulette Blanch, RN Relief Circulator: Nicholos Johns, RN Scrub Person: Terri Piedra  Estimated Blood Loss: Minimal               Procedure: The patient was brought to the operating room and identified as the correct patient.  She was placed supine on the operating table and general anesthesia was induced.  Her left axilla was prepped and draped in usual sterile fashion.  I anesthetized the skin around the multiple chronic sinus tracts in the left axilla with Marcaine.  I then made an elliptical incision incorporating several nodules in sinus tracts with a scalpel.  I then completely excised the skin and subcutaneous tissue containing the hidradenitis for approximately 18 cm with the electrocautery.  Hemostasis was achieved with the cautery.  I then reapproximated some of the subcutaneous tissue with 3-0 Vicryl sutures.  I then closed the skin with interrupted and mattress 2-0 nylon sutures.  Gauze and tape were then applied.  The patient tolerated the procedure well.  All the counts were correct at the end of the procedure.  The patient was then extubated in the operating room and taken in a stable condition to the recovery room.          Victoria Rodriguez A   Date: 10/11/2017  Time: 10:06 AM

## 2017-10-11 NOTE — Anesthesia Postprocedure Evaluation (Signed)
Anesthesia Post Note  Patient: GELSEY AMYX  Procedure(s) Performed: WIDE EXCISION HIDRADENITIS LEFT AXILLA ERAS PATHWAY (Left Axilla)     Patient location during evaluation: PACU Anesthesia Type: General Level of consciousness: awake and alert Pain management: pain level controlled Vital Signs Assessment: post-procedure vital signs reviewed and stable Respiratory status: spontaneous breathing, nonlabored ventilation, respiratory function stable and patient connected to nasal cannula oxygen Cardiovascular status: blood pressure returned to baseline and stable Postop Assessment: no apparent nausea or vomiting Anesthetic complications: no    Last Vitals:  Vitals:   10/11/17 1130 10/11/17 1144  BP: 128/83 120/87  Pulse: 71 73  Resp: 19 20  Temp: 36.5 C   SpO2: 99% 100%    Last Pain:  Vitals:   10/11/17 1130  TempSrc:   PainSc: 0-No pain                 Cornelious Bartolucci COKER

## 2017-10-12 ENCOUNTER — Encounter (HOSPITAL_COMMUNITY): Payer: Self-pay | Admitting: Surgery

## 2017-11-07 ENCOUNTER — Ambulatory Visit (INDEPENDENT_AMBULATORY_CARE_PROVIDER_SITE_OTHER): Payer: Medicare Other | Admitting: Internal Medicine

## 2017-11-07 ENCOUNTER — Encounter: Payer: Self-pay | Admitting: Internal Medicine

## 2017-11-07 DIAGNOSIS — N920 Excessive and frequent menstruation with regular cycle: Secondary | ICD-10-CM

## 2017-11-07 HISTORY — DX: Excessive and frequent menstruation with regular cycle: N92.0

## 2017-11-07 MED ORDER — NORGESTIMATE-ETH ESTRADIOL 0.25-35 MG-MCG PO TABS: 1.0000 | ORAL_TABLET | Freq: Every day | ORAL | 3 refills | Status: DC

## 2017-11-07 NOTE — Progress Notes (Signed)
   Subjective:   Patient: Victoria Rodriguez       Birthdate: Apr 23, 1981       MRN: [id number]      HPI  Victoria Rodriguez is a 37 y.o. female presenting for same day appt for heavy menstrual bleeding.   Heavy menstrual bleeding Began after the birth of her daughter who is now 17 months old. Has continued to worsen over the past few months. Has regular monthly periods that last about 5d, with two heavy days on which she wears both a tampon and a pad. Cannot say how many tampons she goes through on a heavy day. Endorses significant cramping for which she takes Motrin which helps some. Endorses large blood clots with menstrual bleeding. Denies lightheadedness, dizziness. Has had tubal ligation.   Smoking status reviewed. Patient is never smoker.   Review of Systems See HPI.     Objective:  Physical Exam  Constitutional: She is oriented to person, place, and time. She appears well-developed and well-nourished. No distress.  HENT:  Head: Normocephalic.  Pulmonary/Chest: Effort normal. No respiratory distress.  Neurological: She is alert and oriented to person, place, and time.  Psychiatric: She has a normal mood and affect. Her behavior is normal.   Assessment & Plan:  Heavy menstrual bleeding Beginning after birth of her last child. Regular monthly periods lasting 5d. No reported dizziness or lightheadedness that would necessitate checking Hgb today. Will begin trial of Sprintec. Also discussed using NSAIDs for three days in advance of beginning of menstrual cycle, in addition to using for heavy days of cycle to improve cramping.    Adin Hector, MD, MPH PGY-3 [facility] Pager [phone]

## 2017-11-07 NOTE — Patient Instructions (Addendum)
It was nice seeing you today Victoria Rodriguez!  Please begin taking Sprintec (birth control pills) to help control your period. You can either begin today or begin on Sunday to stay on track with the pill packet.   To help with cramping, begin taking 1-2 ibuprofen tablets about every 8 hours 3 days before your period starts, and continuing until the heaviest days of your period have ended.   If you have any questions or concerns, please feel free to call the clinic.   Be well,  Dr. Avon Gully

## 2017-11-07 NOTE — Assessment & Plan Note (Signed)
Beginning after birth of her last child. Regular monthly periods lasting 5d. No reported dizziness or lightheadedness that would necessitate checking Hgb today. Will begin trial of Sprintec. Also discussed using NSAIDs for three days in advance of beginning of menstrual cycle, in addition to using for heavy days of cycle to improve cramping.

## 2017-11-21 ENCOUNTER — Encounter: Payer: Self-pay | Admitting: Internal Medicine

## 2017-11-21 ENCOUNTER — Ambulatory Visit (INDEPENDENT_AMBULATORY_CARE_PROVIDER_SITE_OTHER): Payer: Medicare Other | Admitting: Internal Medicine

## 2017-11-21 ENCOUNTER — Other Ambulatory Visit: Payer: Self-pay

## 2017-11-21 ENCOUNTER — Other Ambulatory Visit (HOSPITAL_COMMUNITY)
Admission: RE | Admit: 2017-11-21 | Discharge: 2017-11-21 | Disposition: A | Discharge status: Home / Self Care | Payer: Medicare Other | Source: Ambulatory Visit | Attending: Family Medicine | Admitting: Family Medicine

## 2017-11-21 VITALS — BP 110/60 | Temp 98.6°F | Ht 63.0 in | Wt 188.0 lb

## 2017-11-21 DIAGNOSIS — Z114 Encounter for screening for human immunodeficiency virus [HIV]: Secondary | ICD-10-CM | POA: Diagnosis not present

## 2017-11-21 DIAGNOSIS — D649 Anemia, unspecified: Secondary | ICD-10-CM

## 2017-11-21 DIAGNOSIS — E7889 Other lipoprotein metabolism disorders: Secondary | ICD-10-CM | POA: Diagnosis not present

## 2017-11-21 DIAGNOSIS — D509 Iron deficiency anemia, unspecified: Secondary | ICD-10-CM

## 2017-11-21 DIAGNOSIS — Z113 Encounter for screening for infections with a predominantly sexual mode of transmission: Secondary | ICD-10-CM | POA: Insufficient documentation

## 2017-11-21 DIAGNOSIS — Z0000 Encounter for general adult medical examination without abnormal findings: Secondary | ICD-10-CM

## 2017-11-21 NOTE — Progress Notes (Signed)
[facility] Phone: [phone]   Date of Visit: 11/21/2017   HPI:  Patient presents today for a well woman exam.   Concerns today: none Periods: monthly periods but very heavy. Last about 5 days.  Contraception: none currently. Is planning to start OCP which was prescribed earlier this month  Pelvic symptoms: denies pelvic pain, vaginal discharge or irritation  Sexual activity: 1 partner; female  STD Screening: Would like HIV and syphilis screening as well as gonorrhea and chlamydia Pap smear status: normal cytology with negative HPV on 07/2016 Exercise: Does not exercise regularly.  She reports that she does not have any specific obstacles that prevent her from doing this.  She knows she needs to start being active.  Reports that she was in the past; she used to walk 3 miles a day at least.  She would like to set a goal for herself.  She would like to start walking 1 mile a day. Diet: vegetables three times a day. Fast food about once a week. No sweets.  She knows that she can improve her dietary intake significantly. Smoking: never smoker Alcohol: 1 or 2 drinks once a month or less Drugs: Denies Mood: PHQ 2 negative Dentist: Sees regularly  ROS:  Review of Systems  Constitutional: Negative for chills, fever, malaise/fatigue and weight loss.  HENT: Negative for ear discharge, ear pain, hearing loss and tinnitus.   Eyes: Negative for blurred vision, double vision and pain.  Respiratory: Negative for cough and shortness of breath.   Cardiovascular: Negative for chest pain, palpitations, orthopnea and leg swelling.  Gastrointestinal: Negative for abdominal pain, blood in stool, constipation, diarrhea, nausea and vomiting.  Genitourinary: Negative for dysuria, frequency, hematuria and urgency.  Musculoskeletal: Negative for joint pain and myalgias.  Skin: Negative for rash.  Neurological: Negative for dizziness, sensory change, focal weakness, loss of  consciousness, weakness and headaches.  Endo/Heme/Allergies: Negative for polydipsia.  Psychiatric/Behavioral: Negative for depression, memory loss, substance abuse and suicidal ideas. The patient does not have insomnia.    [place]:  Cancers in family: None that she knows of No MI or strokes in family history Other family history reviewed and updated in her chart.  PMH: Obesity  BPPV Hidradenitis Suppurativa  PHYSICAL EXAM: BP 110/60   Temp 98.6 F (37 C) (Oral)   Ht 5\' 3"  (1.6 m)   Wt 188 lb (85.3 kg)   LMP 11/11/2017 (Approximate)   Breastfeeding? No   BMI 33.30 kg/m  Gen: NAD, pleasant, cooperative HEENT: NCAT, PERRL, no palpable thyromegaly or anterior cervical lymphadenopathy Heart: RRR, no murmurs Lungs: CTAB, NWOB Abdomen: soft, nontender to palpation Neuro: grossly nonfocal, speech normal GU: normal appearing external genitalia without lesions. Vagina is moist with white discharge. Cervix normal in appearance. No cervical motion tenderness or tenderness on bimanual exam. No adnexal masses.   ASSESSMENT/PLAN:  # Health maintenance:  -STD screening: HIV, RPR, gonorrhea and Chlamydia testing -pap smear: Up-to-date, next Pap will be due in February 2023  -lipid screening: ordered as a future order.  Patient knows to make a lab visit and come in fasting. -immunizations: Up-to-date -information given on health maintenance topics  Microcytic anemia: Appears chronic but worsened in February 2019.  Last CBC with hemoglobin of 8.7 and MCV of 70.2 in May 2019.  She likely has iron deficiency anemia due to her menorrhagia.  She will start taking OCPs prescribed to her earlier this month.  Hopefully this will help her control menorrhagia.   Will  obtain a repeat CBC as well as iron studies.  Likely will benefit from iron supplement.  Smiley Houseman, MD PGY [place]

## 2017-11-21 NOTE — Patient Instructions (Addendum)
1) Exercise goal: walk 1 mile a day  2) Check out [email or web address]  3) please make a lab visit and come in fasting for your cholesterol check. (please call to make a lab visit before)  4) we will get in touch with you about your labs

## 2017-11-22 ENCOUNTER — Telehealth: Payer: Self-pay | Admitting: Internal Medicine

## 2017-11-22 ENCOUNTER — Encounter: Payer: Self-pay | Admitting: Internal Medicine

## 2017-11-22 LAB — CBC
Hematocrit: 28.2 % — ABNORMAL LOW (ref 34.0–46.6)
Hemoglobin: 8.6 g/dL — ABNORMAL LOW (ref 11.1–15.9)
MCH: 20.9 pg — AB (ref 26.6–33.0)
MCHC: 30.5 g/dL — ABNORMAL LOW (ref 31.5–35.7)
MCV: 69 fL — ABNORMAL LOW (ref 79–97)
PLATELETS: 463 10*3/uL — AB (ref 150–450)
RBC: 4.11 x10E6/uL (ref 3.77–5.28)
RDW: 18.6 % — AB (ref 12.3–15.4)
WBC: 4.3 10*3/uL (ref 3.4–10.8)

## 2017-11-22 LAB — RPR: RPR: NONREACTIVE

## 2017-11-22 LAB — CERVICOVAGINAL ANCILLARY ONLY
CHLAMYDIA, DNA PROBE: NEGATIVE
NEISSERIA GONORRHEA: NEGATIVE
Trichomonas: NEGATIVE

## 2017-11-22 LAB — FERRITIN: Ferritin: 6 ng/mL — ABNORMAL LOW (ref 15–150)

## 2017-11-22 LAB — IRON AND TIBC
IRON SATURATION: 7 % — AB (ref 15–55)
IRON: 26 ug/dL — AB (ref 27–159)
Total Iron Binding Capacity: 349 ug/dL (ref 250–450)
UIBC: 323 ug/dL (ref 131–425)

## 2017-11-22 LAB — HIV ANTIBODY (ROUTINE TESTING W REFLEX): HIV Screen 4th Generation wRfx: NONREACTIVE

## 2017-11-22 MED ORDER — FERROUS SULFATE 325 (65 FE) MG PO TABS: 325.0000 mg | ORAL_TABLET | Freq: Two times a day (BID) | ORAL | 3 refills | Status: DC

## 2017-11-22 NOTE — Telephone Encounter (Signed)
Attempted to call patient to discuss lab work. Will send mychart message as well.   1) she has iron deficiency anemia. I would recommend starting Ferrous Sulfate 325 mg twice a day. This can cause constipation; if this happens she can let me know. I will go ahead and send in the medication to pharmacy. She should have her hemoglobin checked in about 1 month to make sure her iron deficiency is improving. I would continue iron supplements for a few months so her body stores can be replenished. Because she is on daily doxycycline, she should take the iron tablets at least 2 hours before, or 4 hours after taking her Doxycycline.   2) her STI screening tests were all negative.   Will also release results in my-chart.

## 2017-11-23 NOTE — Telephone Encounter (Signed)
Pt called back, she was informed of the message. Deverick Pruss, Salome Spotted, CMA

## 2017-11-24 NOTE — Telephone Encounter (Signed)
Noted  

## 2017-12-29 ENCOUNTER — Ambulatory Visit: Payer: Medicare Other | Admitting: Family Medicine

## 2018-01-02 ENCOUNTER — Ambulatory Visit (INDEPENDENT_AMBULATORY_CARE_PROVIDER_SITE_OTHER): Payer: Medicare Other | Admitting: Student in an Organized Health Care Education/Training Program

## 2018-01-02 ENCOUNTER — Other Ambulatory Visit: Payer: Self-pay

## 2018-01-02 VITALS — BP 114/64 | HR 65 | Temp 98.6°F | Ht 63.0 in | Wt 192.2 lb

## 2018-01-02 DIAGNOSIS — L0292 Furuncle, unspecified: Secondary | ICD-10-CM | POA: Diagnosis not present

## 2018-01-02 NOTE — Progress Notes (Signed)
   CC: boil  HPI: Victoria Rodriguez is a 37 y.o. female with PMH significant for hidradenitis who presents to [facility] today with a boil of 1 to 2 days duration.  "Bump" on head Patient presents with a bump on her head that she noticed 1 to 2 days ago.  She reports that it is tender to palpation but has not been warm.  She does have a history of hidradenitis.  She does have a history of a boil in this spot previously.  She underwent incision and drainage for her previous boil, however reports that there was no drainage from that boil.  The boil later drained on its own.  This time, she reports she has had no fevers.  She has tried warm compresses.  She is not interested in incision and drainage today.  She does take prophylactic Bactrim for hidradenitis.  Review of Symptoms:  See HPI for ROS.   CC, SH/smoking status, and VS noted.  Objective: BP 114/64   Pulse 65   Temp 98.6 F (37 C) (Oral)   Ht 5\' 3"  (1.6 m)   Wt 192 lb 3.2 oz (87.2 kg)   LMP 12/31/2017 (Exact Date)   SpO2 100%   BMI 34.05 kg/m  GEN: NAD, alert, cooperative, and pleasant. SKIN: 3 cm boil with no surrounding redness or erythema. Tender to palpation over the lesion. No other rashes or lesions noted.  Assessment and plan:  Boil on head No surrounding erythema or redness. Patient reports that previous boil in that area drained on its own. Offered I&D but patient is not interested at this time. Patient is on prophylactic antibiotics for hidradenitis.  - warm compresses - follow up if symptoms worsen or fail to improve. - OTC pain control PRN  Everrett Coombe, MD,MS,  PGY3 01/07/2018 10:24 AM

## 2018-01-02 NOTE — Patient Instructions (Signed)
It was a pleasure seeing you today in our clinic.   Please use warm compresses and return if you notice heat, redness, or more discomfort over the knot in your neck.  Our clinic's number is [phone]. Please call with questions or concerns about what we discussed today.  Be well, Dr. Burr Medico

## 2018-01-05 ENCOUNTER — Other Ambulatory Visit: Payer: Self-pay

## 2018-01-05 ENCOUNTER — Ambulatory Visit (INDEPENDENT_AMBULATORY_CARE_PROVIDER_SITE_OTHER): Payer: Medicare Other | Admitting: Family Medicine

## 2018-01-05 ENCOUNTER — Encounter: Payer: Self-pay | Admitting: Family Medicine

## 2018-01-05 VITALS — BP 108/62 | Temp 99.0°F | Ht 63.0 in | Wt 190.8 lb

## 2018-01-05 DIAGNOSIS — L0212 Furuncle of neck: Secondary | ICD-10-CM | POA: Insufficient documentation

## 2018-01-05 HISTORY — DX: Furuncle of neck: L02.12

## 2018-01-05 MED ORDER — SULFAMETHOXAZOLE-TRIMETHOPRIM 800-160 MG PO TABS: 1.0000 | ORAL_TABLET | Freq: Two times a day (BID) | ORAL | 0 refills | Status: DC

## 2018-01-05 NOTE — Patient Instructions (Signed)
Take bactrim twice a day for 7 days.  Come back on Monday to see if the boil can be drained at that time.

## 2018-01-05 NOTE — Assessment & Plan Note (Signed)
Treat with bactrim for 7 days, advised RTC if not improved on Monday to assess for potential I&D.

## 2018-01-05 NOTE — Progress Notes (Signed)
    Subjective:  Victoria Rodriguez is a 37 y.o. female who presents to the [facility] today for reevaluation of boil.  HPI:  Patient has a knot on the back of her R head, was seen for this on 01/02/18 and was told to do warm compresses.  She is back today because she thinks the boil has gotten bigger. Has been on chronic doxycycline for hidradenitis, supposed to be on it daily but has only been taking it more like twice a day because she is tired of taking medication. No fever, chills, neck stiffness, redness, drainage.  ROS: Per HPI   Objective:  Physical Exam: BP 108/62   Temp 99 F (37.2 C) (Oral)   Ht 5\' 3"  (1.6 m)   Wt 190 lb 12.8 oz (86.5 kg)   LMP 12/31/2017 (Exact Date)   BMI 33.80 kg/m   Gen: NAD, resting comfortably Neck: no neck stiffness from reduced ROM Skin: large nonerythematous tender boil as per picture below without drainage or fluctuance. Neuro: grossly normal, moves all extremities Psych: Normal affect and thought content      Assessment/Plan:  Boil of neck Treat with bactrim for 7 days, advised RTC if not improved on Monday to assess for potential I&D.   Bufford Lope, DO PGY-3, [facility] 01/05/2018 10:07 AM

## 2018-01-07 ENCOUNTER — Encounter (HOSPITAL_COMMUNITY): Payer: Self-pay | Admitting: Emergency Medicine

## 2018-01-07 ENCOUNTER — Emergency Department (HOSPITAL_COMMUNITY)
Admission: EM | Admit: 2018-01-07 | Discharge: 2018-01-07 | Disposition: A | Discharge status: Home / Self Care | Payer: Medicare Other | Attending: Emergency Medicine | Admitting: Emergency Medicine

## 2018-01-07 DIAGNOSIS — L02811 Cutaneous abscess of head [any part, except face]: Secondary | ICD-10-CM | POA: Diagnosis not present

## 2018-01-07 DIAGNOSIS — Z79899 Other long term (current) drug therapy: Secondary | ICD-10-CM | POA: Diagnosis not present

## 2018-01-07 DIAGNOSIS — L0211 Cutaneous abscess of neck: Secondary | ICD-10-CM | POA: Diagnosis not present

## 2018-01-07 MED ORDER — LIDOCAINE-EPINEPHRINE (PF) 2 %-1:200000 IJ SOLN
20.0000 mL | Freq: Once | INTRAMUSCULAR | Status: AC
  Administered 2018-01-07: 20 mL
  Filled 2018-01-07: qty 20

## 2018-01-07 NOTE — ED Provider Notes (Signed)
[facility] EMERGENCY DEPARTMENT Provider Note   CSN: [id number] Arrival date & time: 01/07/18  0330     History   Chief Complaint Chief Complaint  Patient presents with  . Abscess    HPI Victoria SOTTO is a 37 y.o. female.  HPI Patient is a 37 year old female who presents the emergency department complaints of abscess to the back of her neck and head of the past week.  She was started on antibiotics 2 days ago but today the pain seemed more severe and that she came to the ER for further evaluation.  No fevers or chills.  History of recurrent hidradenitis.  No other complaints.  Pain is worse with palpation and is moderate in severity.  No drainage.   Past Medical History:  Diagnosis Date  . BPPV (benign paroxysmal positional vertigo) 02/16/2016  . Chronic Recurrent Hidradenitis Suppurativa 04/18/2012   Axillary, submammary and perineal chronic recurrent hidradenitis   . Keratosis punctata 02/29/2012  . Right axillary hidradenitis 01/2017    Patient Active Problem List   Diagnosis Date Noted  . Boil of neck 01/05/2018  . Heavy menstrual bleeding 11/07/2017  . Post-viral cough syndrome 09/28/2017  . BPPV (benign paroxysmal positional vertigo) 02/16/2016  . BMI 40.0-44.9, adult ([place]) 12/23/2015  . Chronic Hurley Stage III Hidradenitis Suppurativa 04/18/2012    Past Surgical History:  Procedure Laterality Date  . AXILLARY HIDRADENITIS EXCISION Bilateral 05/15/2007  . AXILLARY HIDRADENITIS EXCISION Left 01/23/2007; 10/25/2007  . AXILLARY HIDRADENITIS EXCISION Right 09/11/2009  . CESAREAN SECTION N/A 01/25/2016   Procedure: CESAREAN SECTION;  Surgeon: Mora Bellman, MD;  Location: [facility];  Service: Obstetrics;  Laterality: N/A;  . HYDRADENITIS EXCISION Left 02/12/2014   Procedure: EXCISION HIDRADENITIS AXILLA;  Surgeon: Coralie Keens, MD;  Location: [facility];  Service: General;  Laterality: Left;  . HYDRADENITIS EXCISION  Right 02/08/2017   Procedure: WIDE EXCISION HIDRADENITIS RIGHT  AXILLA;  Surgeon: Coralie Keens, MD;  Location: [facility];  Service: General;  Laterality: Right;  . HYDRADENITIS EXCISION Left 10/11/2017   Procedure: WIDE EXCISION HIDRADENITIS LEFT AXILLA ERAS PATHWAY;  Surgeon: Coralie Keens, MD;  Location: [facility];  Service: General;  Laterality: Left;  . IRRIGATION AND DEBRIDEMENT ABSCESS Left 11/16/2013   Procedure: IRRIGATION AND DEBRIDEMENT LEFT AXILLARY ABSCESS;  Surgeon: Pedro Earls, MD;  Location: [facility];  Service: General;  Laterality: Left;  . LAPAROSCOPIC TUBAL LIGATION Bilateral 04/02/2016   Procedure: LAPAROSCOPIC TUBAL LIGATION;  Surgeon: Woodroe Mode, MD;  Location: [facility];  Service: Gynecology;  Laterality: Bilateral;  . TUBAL LIGATION       OB History    Gravida  5   Para  2   Term  2   Preterm  0   AB  3   Living  2     SAB  1   TAB  2   Ectopic      Multiple  0   Live Births  1        Obstetric Comments  2000: 6lbs 9oz TSVD         Home Medications    Prior to Admission medications   Medication Sig Start Date End Date Taking? Authorizing Provider  doxycycline (ADOXA) 100 MG tablet Take 100 mg by mouth daily.    [provider]  ferrous sulfate 325 (65 FE) MG tablet Take 1 tablet (325 mg total) by mouth 2 (two) times daily. 11/22/17   Smiley Houseman,  MD  norgestimate-ethinyl estradiol (ORTHO-CYCLEN,SPRINTEC,PREVIFEM) 0.25-35 MG-MCG tablet Take 1 tablet by mouth daily. 11/07/17   Verner Mould, MD  sulfamethoxazole-trimethoprim (BACTRIM DS,SEPTRA DS) 800-160 MG tablet Take 1 tablet by mouth 2 (two) times daily. 01/05/18   Bufford Lope, DO    Family History Family History  Problem Relation Age of Onset  . Asthma Mother   . Diabetes Father   . High blood pressure Father   . Asthma Sister   . Asthma Brother     Social History Social History   Tobacco Use  . Smoking status: Never Smoker    . Smokeless tobacco: Never Used  Substance Use Topics  . Alcohol use: No  . Drug use: No     Allergies   Vicodin [hydrocodone-acetaminophen]   Review of Systems Review of Systems  All other systems reviewed and are negative.    Physical Exam Updated Vital Signs BP 139/85 (BP Location: Right Arm)   Pulse 75   Temp 98.6 F (37 C) (Oral)   Resp 16   LMP 12/31/2017 (Exact Date)   SpO2 100%   Physical Exam  Constitutional: She is oriented to person, place, and time. She appears well-developed and well-nourished.  HENT:  Head: Normocephalic.  Posterior scalp fluctuant mass without drainage in the left posterior space.  Hair loss in this area.  No surrounding erythema.  Full range of motion of neck.  Eyes: EOM are normal.  Neck: Normal range of motion.  Pulmonary/Chest: Effort normal.  Abdominal: She exhibits no distension.  Musculoskeletal: Normal range of motion.  Neurological: She is alert and oriented to person, place, and time.  Psychiatric: She has a normal mood and affect.  Nursing note and vitals reviewed.    ED Treatments / Results  Labs (all labs ordered are listed, but only abnormal results are displayed) Labs Reviewed - No data to display  EKG None  Radiology No results found.  Procedures .Marland KitchenIncision and Drainage Performed by: Jola Schmidt, MD Authorized by: Jola Schmidt, MD     INCISION AND DRAINAGE Performed by: Jola Schmidt Consent: Verbal consent obtained. Risks and benefits: risks, benefits and alternatives were discussed Time out performed prior to procedure Type: abscess Body area: posterior scalp Anesthesia: local infiltration Incision was made with a scalpel. Local anesthetic: lidocaine 2% with epinephrine Anesthetic total: 5 ml Complexity: complex Blunt dissection to break up loculations Drainage: purulent Drainage amount: moderate Packing material: none Patient tolerance: Patient tolerated the procedure well with no  immediate complications.     Medications Ordered in ED Medications  lidocaine-EPINEPHrine (XYLOCAINE W/EPI) 2 %-1:200000 (PF) injection 20 mL (has no administration in time range)     Initial Impression / Assessment and Plan / ED Course  I have reviewed the triage vital signs and the nursing notes.  Pertinent labs & imaging results that were available during my care of the patient were reviewed by me and considered in my medical decision making (see chart for details).     Incision and drainage performed.  Patient feels better.  Patient tolerated procedure well.  She is currently on antibiotics.  She will continue these.  She understands to return to the ER for new or worsening symptoms  Final Clinical Impressions(s) / ED Diagnoses   Final diagnoses:  Abscess of skin of neck    ED Discharge Orders    None       Jola Schmidt, MD 01/07/18 [phone]

## 2018-01-07 NOTE — Discharge Instructions (Addendum)
Continue your antibiotics

## 2018-01-07 NOTE — ED Triage Notes (Signed)
Pt reports she has had the return of an abscess on the back of her head X1 week.  Pt has been to her PCP twice however it continues to grow and cause her pain.

## 2018-01-09 ENCOUNTER — Ambulatory Visit: Payer: Medicare Other | Admitting: Family Medicine

## 2018-01-17 ENCOUNTER — Emergency Department (HOSPITAL_COMMUNITY)
Admission: EM | Admit: 2018-01-17 | Discharge: 2018-01-17 | Disposition: A | Discharge status: Home / Self Care | Payer: Medicare Other | Attending: Emergency Medicine | Admitting: Emergency Medicine

## 2018-01-17 ENCOUNTER — Emergency Department (HOSPITAL_COMMUNITY): Payer: Medicare Other

## 2018-01-17 ENCOUNTER — Encounter (HOSPITAL_COMMUNITY): Payer: Self-pay | Admitting: *Deleted

## 2018-01-17 ENCOUNTER — Other Ambulatory Visit: Payer: Self-pay

## 2018-01-17 DIAGNOSIS — M542 Cervicalgia: Secondary | ICD-10-CM | POA: Diagnosis not present

## 2018-01-17 DIAGNOSIS — M549 Dorsalgia, unspecified: Secondary | ICD-10-CM | POA: Diagnosis not present

## 2018-01-17 DIAGNOSIS — Z5321 Procedure and treatment not carried out due to patient leaving prior to being seen by health care provider: Secondary | ICD-10-CM | POA: Diagnosis not present

## 2018-01-17 NOTE — ED Notes (Signed)
No reply when called for room at 13:29.

## 2018-01-17 NOTE — ED Notes (Signed)
No reply when called for room at 13:17.

## 2018-01-17 NOTE — ED Notes (Signed)
No reply when called for room at 13:47.

## 2018-01-17 NOTE — ED Notes (Signed)
Called X-Ray; Pt is not there.
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
## 2018-01-18 NOTE — ED Notes (Signed)
Follow up call made  Has appointment in am  01/18/18  1149  s Contessa Preuss rn

## 2018-01-20 ENCOUNTER — Ambulatory Visit (INDEPENDENT_AMBULATORY_CARE_PROVIDER_SITE_OTHER): Payer: Medicare Other | Admitting: Family Medicine

## 2018-01-20 ENCOUNTER — Encounter: Payer: Self-pay | Admitting: Family Medicine

## 2018-01-20 ENCOUNTER — Other Ambulatory Visit: Payer: Self-pay

## 2018-01-20 VITALS — BP 130/62 | HR 72 | Temp 98.7°F | Ht 63.0 in | Wt 188.0 lb

## 2018-01-20 DIAGNOSIS — L732 Hidradenitis suppurativa: Secondary | ICD-10-CM | POA: Diagnosis not present

## 2018-01-20 NOTE — Progress Notes (Signed)
   CC: boil  HPI  Hx of hiddraadenitis, hx of having it once before in this area of her neck. Thinks she had a persistent knot once in this area. Has had surgery several times  Under both arms. Has had intertriginous episodes as well. Always on doxy for prophylaxis. I&D in ED on 8/10. Now neck hasn't stopped hurting. Told to continune doxy after ED. She has some left and has been taking it. Also wanted to ask about hair falling out wonders if this is related to doxy. No fevers.   ROS: Denies CP, SOB, abdominal pain, dysuria, changes in BMs.   CC, SH/smoking status, and VS noted  Objective: BP 130/62   Pulse 72   Temp 98.7 F (37.1 C) (Oral)   Ht 5\' 3"  (1.6 m)   Wt 188 lb (85.3 kg)   LMP 12/31/2017 (Exact Date)   SpO2 99%   BMI 33.30 kg/m  Gen: NAD, alert, cooperative, and pleasant. HEENT: NCAT, EOMI, PERRL. 3cm x2cm indurated area of L posterior occiput with TTP. No pustule or fluctuance. Nonerythematous, not warm.  CV: RRR, no murmur Resp: CTAB, no wheezes, non-labored Ext: No edema, warm Neuro: Alert and oriented, Speech clear, No gross deficits  I&D: verbal consent obtained for attempted I&D of L neck boil. Cleansed with betadine. Anesthesia with topical spray. Attempted needle I&D with 18ga, only blood return. Hemostatic with pressure. Topical antibiotic and bandaid applied.   Assessment and plan:  Chronic Hurley Stage III Hidradenitis Suppurativa Recurrence at area of previous outbreak. Has not gotten to start on biologics with derm, although saw them. She would like to start and Dr. McDiarmid will reach out to derm. Induration on exam without fluctuance, attempted needle drainage, but only blood returned. Topical abx applied, continue doxy and follow up when she feels more fluctuance is present. Return if febrile or worsening pain. Can continue warm compresses.    Ralene Ok, MD, PGY3 01/20/2018 12:03 PM

## 2018-01-20 NOTE — Assessment & Plan Note (Addendum)
Recurrence at area of previous outbreak. Has not gotten to start on biologics with derm, although saw them. She would like to start and Dr. McDiarmid will reach out to derm. Induration on exam without fluctuance, attempted needle drainage, but only blood returned. Topical abx applied, continue doxy and follow up when she feels more fluctuance is present. Return if febrile or worsening pain. Can continue warm compresses.

## 2018-01-20 NOTE — Patient Instructions (Signed)
It was a pleasure to see you today! Thank you for choosing [facility] for your primary care. Victoria Rodriguez was seen for boil.   Our plans for today were:  You are doing a good job with warm compresses. Please continue the doxy.   Come back when you feel like it's ready.   Best,  Dr. Lindell Noe

## 2018-02-12 ENCOUNTER — Emergency Department (HOSPITAL_COMMUNITY)
Admission: EM | Admit: 2018-02-12 | Discharge: 2018-02-12 | Disposition: A | Discharge status: Home / Self Care | Payer: Medicare Other | Attending: Emergency Medicine | Admitting: Emergency Medicine

## 2018-02-12 ENCOUNTER — Encounter (HOSPITAL_COMMUNITY): Payer: Self-pay

## 2018-02-12 ENCOUNTER — Other Ambulatory Visit: Payer: Self-pay

## 2018-02-12 DIAGNOSIS — Z79899 Other long term (current) drug therapy: Secondary | ICD-10-CM | POA: Insufficient documentation

## 2018-02-12 DIAGNOSIS — Y929 Unspecified place or not applicable: Secondary | ICD-10-CM | POA: Diagnosis not present

## 2018-02-12 DIAGNOSIS — S161XXA Strain of muscle, fascia and tendon at neck level, initial encounter: Secondary | ICD-10-CM | POA: Diagnosis not present

## 2018-02-12 DIAGNOSIS — Y999 Unspecified external cause status: Secondary | ICD-10-CM | POA: Diagnosis not present

## 2018-02-12 DIAGNOSIS — Y939 Activity, unspecified: Secondary | ICD-10-CM | POA: Insufficient documentation

## 2018-02-12 MED ORDER — CYCLOBENZAPRINE HCL 10 MG PO TABS: 10.0000 mg | ORAL_TABLET | Freq: Two times a day (BID) | ORAL | 0 refills | Status: DC | PRN

## 2018-02-12 NOTE — ED Notes (Signed)
Patient verbalized understanding of discharge instructions and denies any further needs or questions at this time. VS stable. Patient ambulatory with steady gait.  
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
## 2018-02-12 NOTE — Discharge Instructions (Addendum)
Do not drive while taking the muscle relaxer as it will make you sleepy. °
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
## 2018-03-07 ENCOUNTER — Ambulatory Visit: Payer: Medicare Other

## 2018-03-07 ENCOUNTER — Emergency Department (HOSPITAL_COMMUNITY)
Admission: EM | Admit: 2018-03-07 | Discharge: 2018-03-07 | Disposition: A | Discharge status: Home / Self Care | Payer: No Typology Code available for payment source

## 2018-03-07 NOTE — Progress Notes (Signed)
  Subjective:   Patient ID: Victoria Rodriguez    DOB: July 04, 1980, 37 y.o. female   MRN: [id number]  Victoria Rodriguez is a 37 y.o. female with a history of HS, obesity here for   SORE THROAT  Sore throat started last Wednesday. The next day started having body aches x1 day, now resolved. Endorses difficulty eating and drinking. Medications tried: nyquil, ibuprofen, theraflu, throat spray doesn't help. Strep throat exposure: no STD exposure: no concerns, denies oral sex  Symptoms Fever: no Cough: no Runny nose: no Muscle aches: yes Swollen Glands: yes Trouble breathing: only at night, will sometimes wake up catching her breath, has h/o snoring. Drooling: no Weight loss: no  Review of Systems:  Per HPI.  [place], medications and smoking status reviewed.  Objective:   BP 112/70   Temp 98.7 F (37.1 C) (Oral)   Wt 187 lb (84.8 kg)   LMP 02/27/2018 (Approximate)   BMI 33.13 kg/m  Vitals and nursing note reviewed.  General: overweight female, in no acute distress with non-toxic appearance HEENT: normocephalic, atraumatic, moist mucous membranes. No ulcers or lesions to MM. Some enlargement to bilateral adenoids (class 2) with exudate. Minimal erythema to oropharynx although difficult to appreciate, class 3 size of oropharynx Neck: supple, non-tender without lymphadenopathy CV: regular rate and rhythm without murmurs, rubs, or gallops Lungs: clear to auscultation bilaterally with normal work of breathing Skin: warm, dry, no rashes or lesions Extremities: warm and well perfused, normal tone MSK: ROM grossly intact, strength intact, gait normal Neuro: Alert and oriented, speech normal  Assessment & Plan:   Sore throat Rapid strep swab negative. Not likely false positive given lack of fever, cervical lymphadenopathy and no sick contact. Counseled on likely viral etiology with supportive care. Return precautions reviewed.   Orders Placed This Encounter  Procedures  . Flu  Vaccine QUAD 36+ mos IM  . Rapid Strep A   No orders of the defined types were placed in this encounter.  Rory Percy, DO PGY-2, [facility] 03/08/2018 11:42 AM

## 2018-03-08 ENCOUNTER — Ambulatory Visit (INDEPENDENT_AMBULATORY_CARE_PROVIDER_SITE_OTHER): Payer: Medicare Other | Admitting: Family Medicine

## 2018-03-08 ENCOUNTER — Other Ambulatory Visit: Payer: Self-pay

## 2018-03-08 VITALS — BP 112/70 | Temp 98.7°F | Wt 187.0 lb

## 2018-03-08 DIAGNOSIS — J029 Acute pharyngitis, unspecified: Secondary | ICD-10-CM | POA: Diagnosis not present

## 2018-03-08 DIAGNOSIS — Z23 Encounter for immunization: Secondary | ICD-10-CM | POA: Diagnosis not present

## 2018-03-08 LAB — POCT RAPID STREP A (OFFICE): RAPID STREP A SCREEN: NEGATIVE

## 2018-03-08 NOTE — Assessment & Plan Note (Signed)
Rapid strep swab negative. Not likely false positive given lack of fever, cervical lymphadenopathy and no sick contact. Counseled on likely viral etiology with supportive care. Return precautions reviewed.

## 2018-03-08 NOTE — Patient Instructions (Signed)
It was great to see you!  Our plans for today:  - You likely have a virus causing your symptoms which your body will clear on its own. Continue to use warm teas and soups to help soothe your throat as well as 1-2 tablespoons of honey. - If your symptoms don't improve in about a week, come back to see [place].  Take care and seek immediate care sooner if you develop any concerns.   Dr. [facility]   Pharyngitis Pharyngitis is redness, pain, and swelling (inflammation) of the throat (pharynx). It is a very common cause of sore throat. Pharyngitis can be caused by a bacteria, but it is usually caused by a virus. Most cases of pharyngitis get better on their own without treatment. What are the causes? This condition may be caused by:  Infection by viruses (viral). Viral pharyngitis spreads from person to person (is contagious) through coughing, sneezing, and sharing of personal items or utensils such as cups, forks, spoons, and toothbrushes.  Infection by bacteria (bacterial). Bacterial pharyngitis may be spread by touching the nose or face after coming in contact with the bacteria, or through more intimate contact, such as kissing.  Allergies. Allergies can cause buildup of mucus in the throat (post-nasal drip), leading to inflammation and irritation. Allergies can also cause blocked nasal passages, forcing breathing through the mouth, which dries and irritates the throat.  What increases the risk? You are more likely to develop this condition if:  You are 44-56 years old.  You are exposed to crowded environments such as daycare, school, or dormitory living.  You live in a cold climate.  You have a weakened disease-fighting (immune) system.  What are the signs or symptoms? Symptoms of this condition vary by the cause (viral, bacterial, or allergies) and can include:  Sore throat.  Fatigue.  Low-grade fever.  Headache.  Joint pain and muscle aches.  Skin  rashes.  Swollen glands in the throat (lymph nodes).  Plaque-like film on the throat or tonsils. This is often a symptom of bacterial pharyngitis.  Vomiting.  Stuffy nose (nasal congestion).  Cough.  Red, itchy eyes (conjunctivitis).  Loss of appetite.  How is this diagnosed? This condition is often diagnosed based on your medical history and a physical exam. Your health care provider will ask you questions about your illness and your symptoms. A swab of your throat may be done to check for bacteria (rapid strep test). Other lab tests may also be done, depending on the suspected cause, but these are rare. How is this treated? This condition usually gets better in 3-4 days without medicine. Bacterial pharyngitis may be treated with antibiotic medicines. Follow these instructions at home:  Take over-the-counter and prescription medicines only as told by your health care provider. ? If you were prescribed an antibiotic medicine, take it as told by your health care provider. Do not stop taking the antibiotic even if you start to feel better. ? Do not give children aspirin because of the association with Reye syndrome.  Drink enough water and fluids to keep your urine clear or pale yellow.  Get a lot of rest.  Gargle with a salt-water mixture 3-4 times a day or as needed. To make a salt-water mixture, completely dissolve -1 tsp of salt in 1 cup of warm water.  If your health care provider approves, you may use throat lozenges or sprays to soothe your throat. Contact a health care provider if:  You have large, tender  lumps in your neck.  You have a rash.  You cough up green, yellow-brown, or bloody spit. Get help right away if:  Your neck becomes stiff.  You drool or are unable to swallow liquids.  You cannot drink or take medicines without vomiting.  You have severe pain that does not go away, even after you take medicine.  You have trouble breathing, and it is not  caused by a stuffy nose.  You have new pain and swelling in your joints such as the knees, ankles, wrists, or elbows. Summary  Pharyngitis is redness, pain, and swelling (inflammation) of the throat (pharynx).  While pharyngitis can be caused by a bacteria, the most common causes are viral.  Most cases of pharyngitis get better on their own without treatment.  Bacterial pharyngitis is treated with antibiotic medicines. This information is not intended to replace advice given to you by your health care provider. Make sure you discuss any questions you have with your health care provider. Document Released: 05/17/2005 Document Revised: 06/22/2016 Document Reviewed: 06/22/2016 Elsevier Interactive Patient Education  [employer].

## 2018-03-17 ENCOUNTER — Other Ambulatory Visit: Payer: Self-pay

## 2018-03-17 ENCOUNTER — Ambulatory Visit (INDEPENDENT_AMBULATORY_CARE_PROVIDER_SITE_OTHER): Payer: Medicare Other | Admitting: Family Medicine

## 2018-03-17 VITALS — BP 116/70 | HR 78 | Temp 99.1°F | Ht 63.0 in | Wt 189.0 lb

## 2018-03-17 DIAGNOSIS — T753XXA Motion sickness, initial encounter: Secondary | ICD-10-CM

## 2018-03-17 MED ORDER — SCOPOLAMINE 1 MG/3DAYS TD PT72: 1.0000 | MEDICATED_PATCH | TRANSDERMAL | 0 refills | Status: DC

## 2018-03-17 NOTE — Patient Instructions (Signed)
It was nice meeting you today.  You were seen in clinic for see sickness patches.  I have prescribed you some scopolamine patches and would advise wearing one today as that would be 4 days prior to your trip.  You can then change the patches every 3 days (72 hours).   Please call clinic if you have any questions.  I hope you enjoy your trip!  Lovenia Kim MD

## 2018-03-17 NOTE — Progress Notes (Signed)
   Subjective:   Patient ID: Victoria Rodriguez    DOB: [birth year], 37 y.o. female   MRN: [id number]  CC: needs patches for sea sickness   HPI: Victoria Rodriguez is a 37 y.o. female who presents to clinic today for the following issue.  Seasickness Patient reports she is leaving on Monday to go on a cruise to [place] with some friends and family.  The cruise will be five days long.  This will be her first time cruising however she has a weak stomach and would like to prevent seasickness.  She has history of BPPV which sometimes cause nausea and dizziness.  No other concerns at this time.  ROS: No fever, chills, nausea, vomiting.  No chest pain, shortness of breath.  Social: Patient is a never smoker. Medications reviewed. Objective:   BP 116/70   Pulse 78   Temp 99.1 F (37.3 C) (Oral)   Ht 5\' 3"  (1.6 m)   Wt 189 lb (85.7 kg)   LMP 02/27/2018 (Approximate)   SpO2 99%   BMI 33.48 kg/m  Vitals and nursing note reviewed.  General: 37 year old female, NAD Neck: supple  CV: RRR no MRG  Lungs: CTAB, normal effort  Abdomen: soft, +bs  Skin: warm, dry, no rash  Extremities: warm and well perfused   Assessment & Plan:   Sea sickness -Rx: Scopalamine patches, quantity 10, to avoid seasickness. -Advised to wear one today as will need to start 4 days prior to trip -may then change q72 hours   Meds ordered this encounter  Medications  . scopolamine (TRANSDERM-SCOP) 1 MG/3DAYS    Sig: Place 1 patch (1.5 mg total) onto the skin every 3 (three) days.    Dispense:  10 patch    Refill:  0   Follow-up: PRN  Lovenia Kim, MD [place] PGY-3

## 2018-07-17 ENCOUNTER — Ambulatory Visit (INDEPENDENT_AMBULATORY_CARE_PROVIDER_SITE_OTHER): Payer: Medicare Other | Admitting: Family Medicine

## 2018-07-17 ENCOUNTER — Other Ambulatory Visit: Payer: Self-pay

## 2018-07-17 VITALS — BP 122/68 | HR 66 | Temp 98.5°F | Wt 198.0 lb

## 2018-07-17 DIAGNOSIS — L732 Hidradenitis suppurativa: Secondary | ICD-10-CM | POA: Diagnosis not present

## 2018-07-17 MED ORDER — DOXYCYCLINE HYCLATE 100 MG PO TABS: 100.0000 mg | ORAL_TABLET | Freq: Two times a day (BID) | ORAL | 0 refills | Status: DC

## 2018-07-17 NOTE — Patient Instructions (Signed)
Thank you for coming in to see [place] today. Please see below to review our plan for today's visit.  You would benefit from Humira.  Please contact the dermatologist to discuss getting the medication.  Take the doxycycline twice daily for 10 days.  Please call the clinic at [phone] if your symptoms worsen or you have any concerns. It was our pleasure to serve you.  Harriet Butte, [place], PGY-3

## 2018-07-17 NOTE — Assessment & Plan Note (Signed)
Chronic.  Multisite involvement.  Current flare under left breast.  Would benefit from Humira, however patient was lost to follow-up regarding prescription.  We discussed starting Biologics at length and patient is amenable to starting medication. - Given short course of doxycycline 100 mg twice daily for 10 days - Advised patient to contact dermatologist to discuss obtaining Humira prescription - Reviewed return precautions, RTC with PCP

## 2018-07-17 NOTE — Progress Notes (Signed)
   Subjective   Patient ID: Victoria Rodriguez    DOB: July 03, 1980, 38 y.o. female   MRN: [id number]  CC: "Boils"  HPI: Victoria Rodriguez is a 38 y.o. female who presents to clinic today for the following:  Hydradenitis: Patient has longstanding chronic stage III hidradenitis suppurativa.  She is presented today for new lesions under her breast bilaterally with 1 particularly under her left breast which has been draining clear fluid.  She denies any fevers or chills, purulent discharge or bleeding, nausea or vomiting, tracking or erythema.  She has undergone several excisions with her last being in May.  She was referred to dermatology for consideration of Humira, however patient was unable to obtain the medication after being seen.  There was some confusion on where the medication was sent and she gave up on obtaining the medication.  She has not received doxycycline since May.  This usually clears up flares.  ROS: see HPI for pertinent.  [place]: Reviewed. Smoking status reviewed. Medications reviewed.  Objective   BP 122/68   Pulse 66   Temp 98.5 F (36.9 C) (Oral)   Wt 198 lb (89.8 kg)   SpO2 99%   BMI 35.07 kg/m  Vitals and nursing note reviewed.  General: well nourished, well developed, NAD with non-toxic appearance HEENT: normocephalic, atraumatic, moist mucous membranes Lungs: normal work of breathing Abdomen: soft, non-tender, non-distended, normoactive bowel sounds Skin: warm, dry, cap refill < 2 seconds, multiple small discrete cysts under right and left breast without palpable fluctuance or tracking, there is a ruptured cyst under the left breast with clear drainage and no erythema or edema or tracking, well-healed scars under axilla from prior excisions Extremities: warm and well perfused, normal tone, no edema  Assessment & Plan   Chronic Hurley Stage III Hidradenitis Suppurativa Chronic.  Multisite involvement.  Current flare under left breast.  Would benefit from  Humira, however patient was lost to follow-up regarding prescription.  We discussed starting Biologics at length and patient is amenable to starting medication. - Given short course of doxycycline 100 mg twice daily for 10 days - Advised patient to contact dermatologist to discuss obtaining Humira prescription - Reviewed return precautions, RTC with PCP  No orders of the defined types were placed in this encounter.  Meds ordered this encounter  Medications  . doxycycline (VIBRA-TABS) 100 MG tablet    Sig: Take 1 tablet (100 mg total) by mouth 2 (two) times daily.    Dispense:  20 tablet    Refill:  0    Harriet Butte, [place], PGY-3 07/17/2018, 9:27 AM

## 2018-07-24 ENCOUNTER — Ambulatory Visit (HOSPITAL_COMMUNITY)
Admission: EM | Admit: 2018-07-24 | Discharge: 2018-07-24 | Disposition: A | Discharge status: Home / Self Care | Payer: Medicare Other | Attending: Family Medicine | Admitting: Family Medicine

## 2018-07-24 ENCOUNTER — Other Ambulatory Visit: Payer: Self-pay

## 2018-07-24 ENCOUNTER — Ambulatory Visit: Payer: Medicare Other

## 2018-07-24 ENCOUNTER — Encounter (HOSPITAL_COMMUNITY): Payer: Self-pay

## 2018-07-24 DIAGNOSIS — L02811 Cutaneous abscess of head [any part, except face]: Secondary | ICD-10-CM

## 2018-07-24 DIAGNOSIS — L0291 Cutaneous abscess, unspecified: Secondary | ICD-10-CM

## 2018-07-24 MED ORDER — LIDOCAINE HCL 2 % IJ SOLN
INTRAMUSCULAR | Status: AC
  Filled 2018-07-24: qty 20

## 2018-07-24 MED ORDER — LIDOCAINE VISCOUS HCL 2 % MT SOLN: 15.0000 mL | Freq: Once | OROMUCOSAL | Status: DC

## 2018-07-24 NOTE — ED Provider Notes (Signed)
[facility]    CSN: [id number] Arrival date & time: 07/24/18  3790     History   Chief Complaint Chief Complaint  Patient presents with  . Abscess    HPI Victoria Rodriguez is a 38 y.o. female who presents to the UC with c/o abscess to the back of her scalp at the hair line. Patient reports this a recurrent problem. She reports severe hidradenitis and last week was started on Doxycycline for that. She has had surgery by CC surgery when her axillary infection was so bad. Patient requesting I&D of the area on the back of her head.  HPI  Past Medical History:  Diagnosis Date  . BPPV (benign paroxysmal positional vertigo) 02/16/2016  . Chronic Recurrent Hidradenitis Suppurativa 04/18/2012   Axillary, submammary and perineal chronic recurrent hidradenitis   . Keratosis punctata 02/29/2012  . Right axillary hidradenitis 01/2017    Patient Active Problem List   Diagnosis Date Noted  . Sore throat 03/08/2018  . Boil of neck 01/05/2018  . Heavy menstrual bleeding 11/07/2017  . Post-viral cough syndrome 09/28/2017  . BPPV (benign paroxysmal positional vertigo) 02/16/2016  . BMI 40.0-44.9, adult ([facility]) 12/23/2015  . Chronic Hurley Stage III Hidradenitis Suppurativa 04/18/2012    Past Surgical History:  Procedure Laterality Date  . AXILLARY HIDRADENITIS EXCISION Bilateral 05/15/2007  . AXILLARY HIDRADENITIS EXCISION Left 01/23/2007; 10/25/2007  . AXILLARY HIDRADENITIS EXCISION Right 09/11/2009  . CESAREAN SECTION N/A 01/25/2016   Procedure: CESAREAN SECTION;  Surgeon: Mora Bellman, MD;  Location: [facility];  Service: Obstetrics;  Laterality: N/A;  . HYDRADENITIS EXCISION Left 02/12/2014   Procedure: EXCISION HIDRADENITIS AXILLA;  Surgeon: Coralie Keens, MD;  Location: [facility];  Service: General;  Laterality: Left;  . HYDRADENITIS EXCISION Right 02/08/2017   Procedure: WIDE EXCISION HIDRADENITIS RIGHT  AXILLA;  Surgeon: Coralie Keens, MD;   Location: [facility];  Service: General;  Laterality: Right;  . HYDRADENITIS EXCISION Left 10/11/2017   Procedure: WIDE EXCISION HIDRADENITIS LEFT AXILLA ERAS PATHWAY;  Surgeon: Coralie Keens, MD;  Location: [facility];  Service: General;  Laterality: Left;  . IRRIGATION AND DEBRIDEMENT ABSCESS Left 11/16/2013   Procedure: IRRIGATION AND DEBRIDEMENT LEFT AXILLARY ABSCESS;  Surgeon: Pedro Earls, MD;  Location: [facility];  Service: General;  Laterality: Left;  . LAPAROSCOPIC TUBAL LIGATION Bilateral 04/02/2016   Procedure: LAPAROSCOPIC TUBAL LIGATION;  Surgeon: Woodroe Mode, MD;  Location: [facility];  Service: Gynecology;  Laterality: Bilateral;  . TUBAL LIGATION      OB History    Gravida  5   Para  2   Term  2   Preterm  0   AB  3   Living  2     SAB  1   TAB  2   Ectopic      Multiple  0   Live Births  1        Obstetric Comments  2000: 6lbs 9oz TSVD         Home Medications    Prior to Admission medications   Medication Sig Start Date End Date Taking? Authorizing Provider  cyclobenzaprine (FLEXERIL) 10 MG tablet Take 1 tablet (10 mg total) by mouth 2 (two) times daily as needed for muscle spasms. 02/12/18   Ashley Murrain, NP  doxycycline (ADOXA) 100 MG tablet Take 100 mg by mouth daily.    [provider]  doxycycline (VIBRA-TABS) 100 MG tablet Take 1 tablet (100 mg total) by  mouth 2 (two) times daily. 07/17/18   [place] Bing, DO  ferrous sulfate 325 (65 FE) MG tablet Take 1 tablet (325 mg total) by mouth 2 (two) times daily. 11/22/17   Smiley Houseman, MD  norgestimate-ethinyl estradiol (ORTHO-CYCLEN,SPRINTEC,PREVIFEM) 0.25-35 MG-MCG tablet Take 1 tablet by mouth daily. 11/07/17   Verner Mould, MD  scopolamine (TRANSDERM-SCOP) 1 MG/3DAYS Place 1 patch (1.5 mg total) onto the skin every 3 (three) days. 03/17/18   Lovenia Kim, MD    Family History Family History  Problem Relation Age of Onset  . Asthma Mother   .  Diabetes Father   . High blood pressure Father   . Asthma Sister   . Asthma Brother     Social History Social History   Tobacco Use  . Smoking status: Never Smoker  . Smokeless tobacco: Never Used  Substance Use Topics  . Alcohol use: No  . Drug use: No     Allergies   Vicodin [hydrocodone-acetaminophen]   Review of Systems Review of Systems  Skin: Positive for wound.  All other systems reviewed and are negative.    Physical Exam Triage Vital Signs ED Triage Vitals  Enc Vitals Group     BP 07/24/18 1039 129/70     Pulse Rate 07/24/18 1039 76     Resp 07/24/18 1039 18     Temp 07/24/18 1039 98.2 F (36.8 C)     Temp src --      SpO2 07/24/18 1039 99 %     Weight 07/24/18 1040 198 lb (89.8 kg)     Height --      Head Circumference --      Peak Flow --      Pain Score 07/24/18 1040 7     Pain Loc --      Pain Edu? --      Excl. in [place]? --    No data found.  Updated Vital Signs BP 129/70 (BP Location: Right Arm)   Pulse 76   Temp 98.2 F (36.8 C)   Resp 18   Wt 198 lb (89.8 kg)   LMP 07/17/2018   SpO2 99%   BMI 35.07 kg/m   Visual Acuity Right Eye Distance:   Left Eye Distance:   Bilateral Distance:    Right Eye Near:   Left Eye Near:    Bilateral Near:     Physical Exam Vitals signs and nursing note reviewed.  Constitutional:      Appearance: She is well-developed.  HENT:     Head:      Comments: 3 cm raised, tender, fluctuant area to the poster scalp at the hair line.     Mouth/Throat:     Mouth: Mucous membranes are moist.  Neck:     Musculoskeletal: Normal range of motion and neck supple. No neck rigidity.  Cardiovascular:     Rate and Rhythm: Normal rate.  Pulmonary:     Effort: Pulmonary effort is normal.  Abdominal:     Palpations: Abdomen is soft.     Tenderness: There is no abdominal tenderness.  Musculoskeletal: Normal range of motion.  Skin:    General: Skin is warm and dry.  Neurological:     Mental Status: She is  alert and oriented to person, place, and time.  Psychiatric:        Mood and Affect: Mood normal.      UC Treatments / Results  Labs (all labs ordered are listed, but only  abnormal results are displayed) Labs Reviewed - No data to display  Radiology No results found.  Procedures Incision and Drainage Date/Time: 07/24/2018 12:07 PM Performed by: Ashley Murrain, NP Authorized by: Raylene Everts, MD   Consent:    Consent obtained:  Verbal   Consent given by:  Patient   Risks discussed:  Bleeding and incomplete drainage   Alternatives discussed:  Alternative treatment Location:    Type:  Abscess   Location:  Head   Head location:  Scalp Pre-procedure details:    Skin preparation:  Betadine Anesthesia (see MAR for exact dosages):    Anesthesia method:  Local infiltration   Local anesthetic:  Lidocaine 2% w/o epi Procedure type:    Complexity:  Complex Procedure details:    Needle aspiration: no     Incision types:  Single straight   Incision depth:  Dermal   Scalpel blade:  11   Wound management:  Probed and deloculated and irrigated with saline   Drainage:  Purulent   Drainage amount:  Moderate   Packing materials:  1/4 in iodoform gauze Post-procedure details:    Patient tolerance of procedure:  Tolerated well, no immediate complications   (including critical care time)  Medications Ordered in UC Medications - No data to display  Initial Impression / Assessment and Plan / UC Course  I have reviewed the triage vital signs and the nursing notes. 38 y.o. female here with abscess to the scalp stable for d/c without fever or stiff neck. Patient to continue doxycycline and f/u in 2 days for packing removal and wound check.  Final Clinical Impressions(s) / UC Diagnoses   Final diagnoses:  Abscess     Discharge Instructions     Continue your doxycycline and apply warm wet compresses to the area. Follow up with your doctor or return here in 2 days for wound  check and packing removal. Return sooner for any problems.     ED Prescriptions    None     Controlled Substance Prescriptions [place] Controlled Substance Registry consulted? Not Applicable   Ashley Murrain, [place] 07/24/18 1216

## 2018-07-24 NOTE — Discharge Instructions (Addendum)
Continue your doxycycline and apply warm wet compresses to the area. Follow up with your doctor or return here in 2 days for wound check and packing removal. Return sooner for any problems.

## 2018-07-24 NOTE — ED Triage Notes (Signed)
Pt cc she has a abscess on her neck near her hairline. X 1 week.

## 2018-09-20 ENCOUNTER — Encounter: Payer: Self-pay | Admitting: Family Medicine

## 2018-11-10 ENCOUNTER — Ambulatory Visit: Payer: Medicare Other | Admitting: Student in an Organized Health Care Education/Training Program

## 2018-11-23 ENCOUNTER — Other Ambulatory Visit (HOSPITAL_COMMUNITY)
Admission: RE | Admit: 2018-11-23 | Discharge: 2018-11-23 | Disposition: A | Discharge status: Home / Self Care | Payer: Medicare Other | Source: Ambulatory Visit | Attending: Family Medicine | Admitting: Family Medicine

## 2018-11-23 ENCOUNTER — Encounter: Payer: Self-pay | Admitting: Family Medicine

## 2018-11-23 ENCOUNTER — Other Ambulatory Visit: Payer: Self-pay

## 2018-11-23 ENCOUNTER — Ambulatory Visit (INDEPENDENT_AMBULATORY_CARE_PROVIDER_SITE_OTHER): Payer: Medicare Other | Admitting: Family Medicine

## 2018-11-23 VITALS — BP 124/68 | HR 72

## 2018-11-23 DIAGNOSIS — N6310 Unspecified lump in the right breast, unspecified quadrant: Secondary | ICD-10-CM

## 2018-11-23 DIAGNOSIS — N63 Unspecified lump in unspecified breast: Secondary | ICD-10-CM | POA: Diagnosis not present

## 2018-11-23 DIAGNOSIS — Z7251 High risk heterosexual behavior: Secondary | ICD-10-CM | POA: Insufficient documentation

## 2018-11-23 DIAGNOSIS — N6320 Unspecified lump in the left breast, unspecified quadrant: Secondary | ICD-10-CM

## 2018-11-23 DIAGNOSIS — Z114 Encounter for screening for human immunodeficiency virus [HIV]: Secondary | ICD-10-CM | POA: Diagnosis not present

## 2018-11-23 DIAGNOSIS — L732 Hidradenitis suppurativa: Secondary | ICD-10-CM

## 2018-11-23 LAB — POCT WET PREP (WET MOUNT)
Clue Cells Wet Prep Whiff POC: NEGATIVE
Trichomonas Wet Prep HPF POC: ABSENT

## 2018-11-23 MED ORDER — MINOCYCLINE HCL 50 MG PO CAPS: ORAL_CAPSULE | ORAL | 0 refills | Status: AC

## 2018-11-23 NOTE — Patient Instructions (Signed)
Take minocycline 50 mg capsule, two capsules twice a day for 14 days then take 1 capsule twice a day to prevent boils.  Continue to 6 months.   Dr Jermany Rimel will schedule you for a breast mammogram to make sure the fullnesses around your breast are only boils.   Dr Wojciech Willetts will call you if your tests are not good. Otherwise he will send you a letter.  If you sign up for MyChart online, you will be able to see your test results once Dr Adelfo Diebel has reviewed them.  If you do not hear from [place] with in 2 weeks please call our office

## 2018-11-24 ENCOUNTER — Encounter: Payer: Self-pay | Admitting: Family Medicine

## 2018-11-24 ENCOUNTER — Telehealth: Payer: Self-pay | Admitting: *Deleted

## 2018-11-24 DIAGNOSIS — Z7251 High risk heterosexual behavior: Secondary | ICD-10-CM | POA: Insufficient documentation

## 2018-11-24 LAB — RPR: RPR Ser Ql: NONREACTIVE

## 2018-11-24 LAB — CERVICOVAGINAL ANCILLARY ONLY
Chlamydia: NEGATIVE
Neisseria Gonorrhea: NEGATIVE

## 2018-11-24 LAB — HIV ANTIBODY (ROUTINE TESTING W REFLEX): HIV Screen 4th Generation wRfx: NONREACTIVE

## 2018-11-24 NOTE — Progress Notes (Signed)
Subjective:    Patient ID: Victoria Rodriguez, female    DOB: July 24, 1980, 38 y.o.   MRN: [id number] Victoria Rodriguez is alone Sources of clinical information for visit is/are patient and past medical records. Nursing assessment for this office visit was reviewed with the patient for accuracy and revision.   Previous Report(s) Reviewed: historical medical records  Depression screen [facility] 2/9 11/23/2018  Decreased Interest 0  Down, Depressed, Hopeless 0  PHQ - 2 Score 0  Altered sleeping -  Tired, decreased energy -  Change in appetite -  Feeling bad or failure about yourself  -  Trouble concentrating -  Moving slowly or fidgety/restless -  Suicidal thoughts -  PHQ-9 Score -  Some recent data might be hidden   Fall Risk  07/17/2018 01/02/2018 09/28/2017 12/27/2016 07/21/2016  Falls in the past year? 0 No No No No    History/P.E. limitations: none  Adult vaccines due  Topic Date Due  . TETANUS/TDAP  12/08/2025   There are no preventive care reminders to display for this patient. There are no preventive care reminders to display for this patient.   Chief Complaint  Patient presents with  . Annual Exam  . knot on breast     HPI   Knots on breasts - History elements: Onset / Duration: several weeks ago Quality: Subacute; burning Location: bilateral infra-mammillary creases Severity: mild Timing: constant Course: chronic Modifying factors / Treatment prior to visit: Has been off suppressive Doxycycline but had thought it had stopped helping.  Context: pt with know severe hidradenitis suppurativa of axilla and inguinal creases. Last acute tx hidradenitis was in February this year. Patient on oral OCP. Associated symptoms: No fever, no weight loss   High Risk sexual activity - New sexual partner in last couple months - No vaginal pain / itching.  No vaginal discharge. - Partner with no known STD hx.  Presumed monogamous partner.  - (+) unprotected sexual intercourse.    Med list reviewed and updated SH: smoking status reviewed.  Review of Systems No cough nor shob No myalgias, no diarrhea    Objective:   Physical Exam Victoria Rodriguez, CMA, chaperoned for the entire duration of the physical exam. VS reviewed GEN: Alert, Cooperative, Groomed, NAD HEENT: no LAN, no thyromegaly COR: RRR, No M/G/R LUNGS: BCTA, No Acc mm use, speaking in full sentences ABDOMEN: (+)BS, soft, NT, ND, No HSM, No palpable masses GU:  EXT: No peripheral leg edema. Feet without deformity or lesions. Palpable bilateral pedal pulses.  SKIN: Breast: subcutaneous, fluctuant, mobile 1-2 cm nodules symmetric location parasternal/infra-mammary creases. No punctums  .   Multiple areas of firm, shiny scar tissue in inframammary creases bilaterally.   Breast exam with scattered mobile symmetric < 1 cm deep nodules in body of breasts.  No nipple discharge. No axillary nor supraclavicular lymphadenopathy.    Inguinal skin: shiny areas of scarring, area of right inguinal fold with punctum of firm scar.   Gait: Normal speed, No significant path deviation, Step through +,  Psych: Normal affect/thought/speech/language    Assessment & Plan:  Visit Problem List with A/P  Chronic Hurley Stage III Hidradenitis Suppurativa Established problem worsened.   Likely new, active suppurative lesions in skin of torso of mammary creases, but given the ambiguity of whether this instead could be mammary tissue origin, will order a breast ultrasound to investigate the lesions likely origins.   Given the decline in efficacy of doxycycline as prophylactic against hidradenitis lesions, will change  to a different tetracycline, minocycline, for both acute treatment of the current, possible hidradenitis abscesses and the suppression of future ones.   Rx is for 3 months with one refill.  Efficacy should be reevaluated in 6 months.  Pt was unable to get biological medication that dermatologist had  recommended. Will consider addition of antiadrenergic spironolactone.    High risk sexual behavior New problem New sexual partner of uncertain monogamy and practice of unprotected sex.   Self vaginal prep for GC/Chlamydia and wet prep RPR and HIV drawn.  Discussion about safe sexual practices.

## 2018-11-24 NOTE — Assessment & Plan Note (Signed)
Established problem worsened.   Likely new, active suppurative lesions in skin of torso of mammary creases, but given the ambiguity of whether this instead could be mammary tissue origin, will order a breast ultrasound to investigate the lesions likely origins.   Given the decline in efficacy of doxycycline as prophylactic against hidradenitis lesions, will change to a different tetracycline, minocycline, for both acute treatment of the current, possible hidradenitis abscesses and the suppression of future ones.   Rx is for 3 months with one refill.  Efficacy should be reevaluated in 6 months.  Pt was unable to get biological medication that dermatologist had recommended. Will consider addition of antiadrenergic spironolactone.

## 2018-11-24 NOTE — Telephone Encounter (Signed)
Pt would like results from labs mailed to her once reviewed by the MD. Christen Bame, CMA

## 2018-11-24 NOTE — Assessment & Plan Note (Signed)
New problem New sexual partner of uncertain monogamy and practice of unprotected sex.   Self vaginal prep for GC/Chlamydia and wet prep RPR and HIV drawn.  Discussion about safe sexual practices.

## 2018-12-07 ENCOUNTER — Other Ambulatory Visit: Payer: Self-pay | Admitting: Family Medicine

## 2018-12-07 DIAGNOSIS — N6320 Unspecified lump in the left breast, unspecified quadrant: Secondary | ICD-10-CM

## 2018-12-07 DIAGNOSIS — L732 Hidradenitis suppurativa: Secondary | ICD-10-CM

## 2018-12-07 DIAGNOSIS — N6310 Unspecified lump in the right breast, unspecified quadrant: Secondary | ICD-10-CM

## 2018-12-07 DIAGNOSIS — N63 Unspecified lump in unspecified breast: Secondary | ICD-10-CM

## 2018-12-08 NOTE — Telephone Encounter (Signed)
Patient informed.  Jazmin Hartsell,CMA  

## 2018-12-08 NOTE — Telephone Encounter (Signed)
Please let Victoria Rodriguez know her lab results were mailed today.

## 2018-12-13 ENCOUNTER — Other Ambulatory Visit: Payer: Self-pay

## 2018-12-13 ENCOUNTER — Ambulatory Visit
Admission: RE | Admit: 2018-12-13 | Discharge: 2018-12-13 | Disposition: A | Discharge status: Home / Self Care | Payer: Medicare Other | Source: Ambulatory Visit | Attending: Family Medicine | Admitting: Family Medicine

## 2018-12-13 DIAGNOSIS — N6310 Unspecified lump in the right breast, unspecified quadrant: Secondary | ICD-10-CM

## 2018-12-13 DIAGNOSIS — N6320 Unspecified lump in the left breast, unspecified quadrant: Secondary | ICD-10-CM

## 2018-12-13 DIAGNOSIS — N63 Unspecified lump in unspecified breast: Secondary | ICD-10-CM

## 2018-12-13 DIAGNOSIS — N6324 Unspecified lump in the left breast, lower inner quadrant: Secondary | ICD-10-CM | POA: Diagnosis not present

## 2018-12-13 DIAGNOSIS — L732 Hidradenitis suppurativa: Secondary | ICD-10-CM

## 2018-12-13 DIAGNOSIS — N6312 Unspecified lump in the right breast, upper inner quadrant: Secondary | ICD-10-CM | POA: Diagnosis not present

## 2018-12-13 DIAGNOSIS — R928 Other abnormal and inconclusive findings on diagnostic imaging of breast: Secondary | ICD-10-CM | POA: Diagnosis not present

## 2018-12-13 DIAGNOSIS — N6322 Unspecified lump in the left breast, upper inner quadrant: Secondary | ICD-10-CM | POA: Diagnosis not present

## 2018-12-13 DIAGNOSIS — N6314 Unspecified lump in the right breast, lower inner quadrant: Secondary | ICD-10-CM | POA: Diagnosis not present

## 2018-12-14 ENCOUNTER — Encounter: Payer: Self-pay | Admitting: Family Medicine

## 2018-12-14 DIAGNOSIS — N63 Unspecified lump in unspecified breast: Secondary | ICD-10-CM

## 2018-12-14 HISTORY — DX: Unspecified lump in unspecified breast: N63.0

## 2018-12-14 NOTE — Assessment & Plan Note (Addendum)
Dx MM/US: 12/13/18: 1. Bilateral skin findings corresponding with the patient's medial inframammary palpable lumps. This is in keeping with a history of hidradenitis. Recommendation is for clinical and symptomatic follow-up. 2. No mammographic evidence of malignancy in either breast.  RECOMMENDATION: 1. Clinical and symptomatic follow-up for the patient's bilateral skin changes. 2.  Screening mammogram in one year  Assessment and interpretation given to patient at [facility].

## 2019-01-19 ENCOUNTER — Ambulatory Visit (INDEPENDENT_AMBULATORY_CARE_PROVIDER_SITE_OTHER): Payer: Medicare Other | Admitting: Student in an Organized Health Care Education/Training Program

## 2019-01-19 ENCOUNTER — Telehealth (INDEPENDENT_AMBULATORY_CARE_PROVIDER_SITE_OTHER): Payer: Medicare Other | Admitting: Family Medicine

## 2019-01-19 ENCOUNTER — Other Ambulatory Visit (HOSPITAL_COMMUNITY)
Admission: RE | Admit: 2019-01-19 | Discharge: 2019-01-19 | Disposition: A | Discharge status: Home / Self Care | Payer: Medicare Other | Source: Ambulatory Visit | Attending: Family Medicine | Admitting: Family Medicine

## 2019-01-19 ENCOUNTER — Other Ambulatory Visit: Payer: Self-pay

## 2019-01-19 VITALS — BP 110/60 | HR 75 | Wt 191.2 lb

## 2019-01-19 DIAGNOSIS — J029 Acute pharyngitis, unspecified: Secondary | ICD-10-CM | POA: Diagnosis not present

## 2019-01-19 DIAGNOSIS — N898 Other specified noninflammatory disorders of vagina: Secondary | ICD-10-CM

## 2019-01-19 DIAGNOSIS — L732 Hidradenitis suppurativa: Secondary | ICD-10-CM | POA: Diagnosis not present

## 2019-01-19 LAB — POCT WET PREP (WET MOUNT)
Clue Cells Wet Prep Whiff POC: POSITIVE
Trichomonas Wet Prep HPF POC: ABSENT

## 2019-01-19 LAB — POCT URINE PREGNANCY: Preg Test, Ur: NEGATIVE

## 2019-01-19 MED ORDER — FLUCONAZOLE 150 MG PO TABS: 150.0000 mg | ORAL_TABLET | Freq: Once | ORAL | 0 refills | Status: AC

## 2019-01-19 MED ORDER — DOXYCYCLINE HYCLATE 100 MG PO TABS: 100.0000 mg | ORAL_TABLET | Freq: Two times a day (BID) | ORAL | 0 refills | Status: DC

## 2019-01-19 NOTE — Patient Instructions (Signed)
It was a pleasure to see you today!  To summarize our discussion for this visit:  We have tested for STI and yeast infection today.  Some results will be back next week, but the test for yeast is positive so I sent a treatment to your pharmacy  Call the clinic at [phone] if your symptoms worsen or you have any concerns.   Thank you for allowing me to take part in your care,  Dr. Doristine Mango

## 2019-01-19 NOTE — Assessment & Plan Note (Signed)
Patient with vague vaginal irritation, in the setting of new sexual partner.  Patient is concerned she may have an STI and would like to be seen in clinic for STI check. -Rescheduling patient to return this afternoon for STI check, wet prep

## 2019-01-19 NOTE — Assessment & Plan Note (Signed)
Obtained GC chlamydia and wet prep. Performed visual and manual pelvic exam. Obtained urine pregnancy test. Patient declined HIV and RPR testing today.

## 2019-01-19 NOTE — Assessment & Plan Note (Signed)
Patient with throat irritation, younger daughter attends daycare and came home with runny nose and cough, the daughters symptoms have improved, but patient continues to have feeling of mucus in her throat and cough.  She is concerned for an STI in her throat -Patient coming to clinic this afternoon to have throat looked at, STI check

## 2019-01-19 NOTE — Assessment & Plan Note (Signed)
Unspecified acute pharyngitis without systemic symptoms. Most likely due to viral infection. Possibility that could be from STI. Did not collect swab at this time head exam was negative and has a child with viral illness. Recommended patient return for a swab if not improved and would recommend collection of a swab if patient is positive for STI from pelvic swab.

## 2019-01-19 NOTE — Progress Notes (Signed)
195lb   BP- N/A  T- NO FEVER  PT GAVE CONSENT TO TELEPHONE VISIT. Salvatore Marvel, CMA

## 2019-01-19 NOTE — Assessment & Plan Note (Signed)
Patient is having worsening hidradenitis supperativa underneath her breast line, recent imaging did not reveal any other ominous cause of the bumps/sores underneath her breast.  At her last appointment patient was prescribed minocycline 100 mg twice daily for 2 weeks, followed by 50 mg twice daily to suppress further outbreaks, but patient stated she has not yet picked up this medicine. -Patient instructed to pick up medicine from pharmacy and take as prescribed -Patient will still follow-up this afternoon for other concerns

## 2019-01-19 NOTE — Progress Notes (Signed)
[facility] Visit  Patient consented to have virtual visit. Method of visit: Telephone  Encounter participants: Patient: Victoria Rodriguez - located at home Provider: Daisy Floro - located at clinic Others (if applicable): None  Chief Complaint: "few things going on"  HPI: Hidradenitis suppurativa: The patient has an extensive history of hidradenitis suppurativa, for which she has had multiple operations on either axilla.  Over the last couple of months they have been flaring up under her breast and the crease of legs.  The patient has previously treated this condition with doxycycline.  I asked the patient multiple times that there was any chance she was pregnant, to which she responded "no".  Vaginal irritation: For the last few days patient has been having vaginal irritation which she is finding difficult to describe.  She states it is not an itch or burning sensation, she has no pain when she uses the bathroom.  She states that she does have a new sexual partner, that she has been with since before her June 25 appointment (all labs for STIs were negative).  She denies any fevers, rashes, joint pains, eye pains, vaginal itching, vaginal burning, change in discharge, or foul odor.  She states she thinks she detects "a light odor".  Throat irritation: Since Saturday the patient started having minor cough, but no runny nose, sneezing, shortness of breath, chest pain, HA or fever. On Friday her youngest daughter came home from daycare with a runny nose (her daughter started going back to daycare about 2 weeks ago).  The patient states that on Sunday she was feeling better and that on Monday she felt fine.  On Monday, her daughter was completely back to normal.  The patient, however, continues to have minor cough and feeling of "mucus" in her throat.  She is concerned for an STI in her throat.   ROS: per HPI  Pertinent PMHx: Hidradenitis suppurativa,    Exam:  Respiratory: normal work of breathing  Assessment/Plan: Vaginal irritation Patient with vague vaginal irritation, in the setting of new sexual partner.  Patient is concerned she may have an STI and would like to be seen in clinic for STI check. -Rescheduling patient to return this afternoon for STI check, wet prep  Sore throat Patient with throat irritation, younger daughter attends daycare and came home with runny nose and cough, the daughters symptoms have improved, but patient continues to have feeling of mucus in her throat and cough.  She is concerned for an STI in her throat -Patient coming to clinic this afternoon to have throat looked at, STI check  Chronic Hurley Stage III Hidradenitis Suppurativa Patient is having worsening hidradenitis supperativa underneath her breast line, recent imaging did not reveal any other ominous cause of the bumps/sores underneath her breast.  At her last appointment patient was prescribed minocycline 100 mg twice daily for 2 weeks, followed by 50 mg twice daily to suppress further outbreaks, but patient stated she has not yet picked up this medicine. -Patient instructed to pick up medicine from pharmacy and take as prescribed -Patient will still follow-up this afternoon for other concerns  Time spent during visit with patient: 20:03 minutes  Milus Banister, [place], PGY-2 01/19/2019 11:35 AM

## 2019-01-19 NOTE — Progress Notes (Signed)
   Subjective:    Patient ID: Victoria Rodriguez, female    DOB: [birth year], 38 y.o.   MRN: [id number]   CC: pelvic exam  HPI:  Had telemedicine visit this morning.  Endorsed having nondescript vaginal irritation with about discharge and a mild odor, negative for dysuria or hematuria.  Recently had negative HIV and RPR test within the last month and declines retesting today.  She feels safe in her relationship.   Patiently, patient states that she has had a mild sore throat for a few days.  She also has a young child who came home from daycare with a sore throat runny nose and cough.  She denies having any runny nose or cough or fever.  She is able to eat and drink like normal.  She would like to have it examined today for fear that it may be caused by STI due to performing oral sex.  Smoking status reviewed   ROS: pertinent noted in the HPI   Past medical history, surgical, family, and social history reviewed and updated in the EMR as appropriate.  Objective:  BP 110/60   Pulse 75   Wt 191 lb 3.2 oz (86.7 kg)   LMP 01/13/2019   SpO2 100%   BMI 33.87 kg/m   Vitals and nursing note reviewed  General: NAD, pleasant, able to participate in exam Oropharynx: Moist mucous membranes.  Negative for oral lesions.  Pharynx negative for erythema, exudates, edema.  Negative for cervical lymphadenitis Pelvic: Various stages of healing hidradenitis lesions on external genitalia and thighs.  No lesions appear to be infected or draining.  Exam was non-painful to patient.  Thick white discharge was present in vaginal canal.  Cervical loss was observed and nonfriable.  No significant odor was detected.  Wet prep and GC chlamydia swabs collected. Extremities: no edema or cyanosis. Neuro: alert, no obvious focal deficits Psych: Normal affect and mood   Assessment & Plan:    Vaginal irritation Obtained GC chlamydia and wet prep. Performed visual and manual pelvic exam. Obtained urine pregnancy  test. Patient declined HIV and RPR testing today.  Pharyngitis, acute Unspecified acute pharyngitis without systemic symptoms. Most likely due to viral infection. Possibility that could be from STI. Did not collect swab at this time head exam was negative and has a child with viral illness. Recommended patient return for a swab if not improved and would recommend collection of a swab if patient is positive for STI from pelvic swab.    Doristine Mango, [place] [facility] PGY-2

## 2019-01-20 LAB — CERVICOVAGINAL ANCILLARY ONLY
Chlamydia: NEGATIVE
Neisseria Gonorrhea: NEGATIVE

## 2019-01-30 ENCOUNTER — Telehealth: Payer: Self-pay | Admitting: Family Medicine

## 2019-01-30 NOTE — Telephone Encounter (Signed)
Pt is calling and would like to know if Dr. McDiarmid can write a letter for her son's college. The son is not allowed to have his car on campus without an excused reason. Pt states that her son should be able to have his car on campus due to her disability. She said he needs to have the car at all times incase she needs him or to assist her for her appointments.   The pt sent the school a copy of her disability forms but they said they need a signed letter from her PCP.   Please call the pt when form is ready to be picked up at the front desk. [name] will most likely be picking up this form up.

## 2019-01-30 NOTE — Telephone Encounter (Signed)
Will forward to MD. Meili Kleckley,CMA  

## 2019-02-06 ENCOUNTER — Other Ambulatory Visit: Payer: Self-pay

## 2019-02-06 DIAGNOSIS — R6889 Other general symptoms and signs: Secondary | ICD-10-CM | POA: Diagnosis not present

## 2019-02-06 DIAGNOSIS — Z20822 Contact with and (suspected) exposure to covid-19: Secondary | ICD-10-CM

## 2019-02-06 NOTE — Telephone Encounter (Signed)
Please contact Ms Whan to find out What is Ms Vianne Bulls son's name and date of birth.  Is Laiyla Slagel his PCP? Do we have documentation of his disability?

## 2019-02-07 NOTE — Telephone Encounter (Signed)
Spoke with patient and her son's name is Victoria Rodriguez, dob 05/11/1999.  He is not an established patient with our office.  She states that the letter will allow for him to have his car on campus so that he can come home when she needs him to take her to appointments.  Will forward to MD to update him on status.  Jazmin Hartsell,CMA

## 2019-02-08 ENCOUNTER — Telehealth: Payer: Self-pay

## 2019-02-08 ENCOUNTER — Encounter: Payer: Self-pay | Admitting: Family Medicine

## 2019-02-08 ENCOUNTER — Telehealth: Payer: Self-pay | Admitting: Family Medicine

## 2019-02-08 LAB — NOVEL CORONAVIRUS, NAA: SARS-CoV-2, NAA: NOT DETECTED

## 2019-02-08 NOTE — Telephone Encounter (Signed)
Patient came by the office to pick up letter.  Verenise Moulin,CMA

## 2019-02-08 NOTE — Telephone Encounter (Signed)
Pt calling nurse line for COVID test results. Please call pt on [phone]. Ottis Stain, CMA

## 2019-02-08 NOTE — Telephone Encounter (Signed)
Patient called in and received her covid test result  °

## 2019-02-08 NOTE — Telephone Encounter (Signed)
Letter written supporting Mr Jann keeping his car on campus.  The letter is to be mailed to Ms Rosana Berger.

## 2019-02-09 NOTE — Telephone Encounter (Signed)
Please let pt know COVID test was negative for Coronavirus infection

## 2019-02-09 NOTE — Telephone Encounter (Signed)
Patient informed.  Victoria Rodriguez,CMA  

## 2019-03-14 ENCOUNTER — Other Ambulatory Visit: Payer: Self-pay | Admitting: Family Medicine

## 2019-03-14 NOTE — Telephone Encounter (Signed)
The patient is requesting some medication for a yeast infection which she says she gets after taking a certain other medication.  She did not want to come in or have to pay for a virtual appt.  Please call her to let her know if we can call something in for this, to [facility] on [address].  Her contact # is [phone].

## 2019-03-14 NOTE — Telephone Encounter (Signed)
Spoke with patient and she states that she has doxycycline on hand for when her boils start flaring up.  She had to take it the past few days due to some that are coming up in her groin area.  Patient states that she is having a yeast infection and would like to know if she can get diflucan called in for this.  Will forward to MD. Johnney Ou

## 2019-03-15 MED ORDER — FLUCONAZOLE 150 MG PO TABS: 150.0000 mg | ORAL_TABLET | Freq: Once | ORAL | 2 refills | Status: AC

## 2019-03-15 NOTE — Telephone Encounter (Signed)
Please notify patient that prescription/refill was sent into their pharmacy.  

## 2019-03-15 NOTE — Telephone Encounter (Signed)
Patient informed.  Keefe Zawistowski,CMA  

## 2019-03-15 NOTE — Telephone Encounter (Signed)
Patient called again to check on status of getting this prescription.  I informed her the doctor was sent a message yesterday afternoon.  She wants to know if it would be faster to just make an appointment and also why she has not received an answer yet.  Please call her at [phone].

## 2019-05-07 ENCOUNTER — Emergency Department (HOSPITAL_COMMUNITY)
Admission: EM | Admit: 2019-05-07 | Discharge: 2019-05-07 | Disposition: A | Discharge status: Home / Self Care | Payer: Medicare Other | Attending: Emergency Medicine | Admitting: Emergency Medicine

## 2019-05-07 DIAGNOSIS — Z5321 Procedure and treatment not carried out due to patient leaving prior to being seen by health care provider: Secondary | ICD-10-CM | POA: Insufficient documentation

## 2019-05-07 DIAGNOSIS — R079 Chest pain, unspecified: Secondary | ICD-10-CM | POA: Insufficient documentation

## 2019-05-07 LAB — CBC
HCT: 25.7 % — ABNORMAL LOW (ref 36.0–46.0)
Hemoglobin: 7.1 g/dL — ABNORMAL LOW (ref 12.0–15.0)
MCH: 17.9 pg — ABNORMAL LOW (ref 26.0–34.0)
MCHC: 27.6 g/dL — ABNORMAL LOW (ref 30.0–36.0)
MCV: 64.9 fL — ABNORMAL LOW (ref 80.0–100.0)
Platelets: 429 10*3/uL — ABNORMAL HIGH (ref 150–400)
RBC: 3.96 MIL/uL (ref 3.87–5.11)
RDW: 20.6 % — ABNORMAL HIGH (ref 11.5–15.5)
WBC: 5.6 10*3/uL (ref 4.0–10.5)
nRBC: 0 % (ref 0.0–0.2)

## 2019-05-07 LAB — BASIC METABOLIC PANEL
Anion gap: 6 (ref 5–15)
BUN: 9 mg/dL (ref 6–20)
CO2: 25 mmol/L (ref 22–32)
Calcium: 8.8 mg/dL — ABNORMAL LOW (ref 8.9–10.3)
Chloride: 107 mmol/L (ref 98–111)
Creatinine, Ser: 0.55 mg/dL (ref 0.44–1.00)
GFR calc Af Amer: >60 mL/min (ref >60)
GFR calc non Af Amer: >60 mL/min (ref >60)
Glucose, Bld: 96 mg/dL (ref 70–99)
Potassium: 3.6 mmol/L (ref 3.5–5.1)
Sodium: 138 mmol/L (ref 135–145)

## 2019-05-07 LAB — I-STAT BETA HCG BLOOD, ED (MC, WL, AP ONLY): I-stat hCG, quantitative: <5 m[IU]/mL (ref <5)

## 2019-05-07 LAB — TROPONIN I (HIGH SENSITIVITY): Troponin I (High Sensitivity): <2 ng/L (ref <18)

## 2019-05-07 MED ORDER — SODIUM CHLORIDE 0.9% FLUSH: 3.0000 mL | Freq: Once | INTRAVENOUS | Status: DC

## 2019-05-07 NOTE — ED Triage Notes (Signed)
t reports since Thursday she has had a pressure in the middle of her chest that comes and goes does not seem to be associated with anything or any other sxs.

## 2019-05-08 ENCOUNTER — Other Ambulatory Visit: Payer: Self-pay

## 2019-05-08 ENCOUNTER — Ambulatory Visit (INDEPENDENT_AMBULATORY_CARE_PROVIDER_SITE_OTHER): Payer: Medicare Other | Admitting: Family Medicine

## 2019-05-08 DIAGNOSIS — R079 Chest pain, unspecified: Secondary | ICD-10-CM | POA: Diagnosis not present

## 2019-05-08 DIAGNOSIS — D509 Iron deficiency anemia, unspecified: Secondary | ICD-10-CM | POA: Insufficient documentation

## 2019-05-08 DIAGNOSIS — D500 Iron deficiency anemia secondary to blood loss (chronic): Secondary | ICD-10-CM

## 2019-05-08 MED ORDER — NORGESTIMATE-ETH ESTRADIOL 0.25-35 MG-MCG PO TABS: 1.0000 | ORAL_TABLET | Freq: Every day | ORAL | 3 refills | Status: DC

## 2019-05-08 MED ORDER — FAMOTIDINE 20 MG PO TABS: 20.0000 mg | ORAL_TABLET | Freq: Two times a day (BID) | ORAL | 0 refills | Status: DC

## 2019-05-08 NOTE — Patient Instructions (Signed)
I think that you chest pain is most likely due to your low blood level (anemia). You would benefit from IV iron because you are quite low. You can also try to take the famotidine once daily for a week to see if that makes a difference but I have a low suspicion that this discomfort is related to reflux.  Make sure you follow up in 3 months for a repeat blood test to check you blood level.

## 2019-05-08 NOTE — Assessment & Plan Note (Signed)
The differential for her chest pain includes symptomatic anemia, GERD, anxiety.  Her labs are notable for a hemoglobin of 7.1.  No evidence of ischemic changes on EKG.  Troponin x1 is unremarkable.  No significant cardiac history in the family.  Will treat as symptomatic anemia and add an H2 blocker for possible contribution from GERD. -Feraheme as noted under anemia problem -Famotidine 20 mg daily for 1 week and monitor for symptom improvement.

## 2019-05-08 NOTE — Progress Notes (Signed)
Subjective:  Victoria Rodriguez is a 38 y.o. female who presents to the [facility] today with a chief complaint of follow-up for chest pain.   HPI: Chest pain Ms. Mesidor reports that she has been experiencing chest pain for the past 5 days.  This chest pain is described as a mild cramping feeling underneath her sternum.  She has not noted any radiation to left arm, jaw, back.  She notices this pain most at night when she lies down.  She is not associated this chest discomfort with exertion.  The pain does seem to be progressive in the last several days and she got concerned enough to be assessed in the emergency room yesterday.  She had a number of labs drawn in the ED but did not stay long enough to hear the results.  She decided to leave the emergency room because she could see that she was surrounded by sick people and she was concerned that she might get coronavirus.  She denies fever, cough, nausea, vomiting, shortness of breath, abdominal pain, changes in bowel movements.  Iron deficiency anemia She notes that she has a long history of heavy menstrual bleeding.  She notes that her periods typically come predictably and last for about 5 days but are very heavy.  She has been recommended to take OCPs in the past but has not started them.  She endorses vague symptoms of fatigue.  She has not noticed any bright red blood per rectum or dark black, sticky stools.  Chief Complaint noted Review of Symptoms - see HPI PMH -menorrhagia   Objective:  Physical Exam: BP 132/68   Pulse 68   Wt 192 lb (87.1 kg)   LMP 04/26/2019 (Exact Date)   SpO2 97%   BMI 34.01 kg/m    Gen: NAD, resting comfortably CV: Regular rate and rhythm.  2/6 systolic murmur crescendo, decrescendo quality.  Loudest at left second intercostal space.  Some radiation to carotid noted.  No tenderness to palpation of the costochondral junction.  No rubs or gallops noted.  No thrills. Pulm: Breathing comfortably on room  air.  Results for orders placed or performed during the hospital encounter of 05/07/19 (from the past 72 hour(s))  Basic metabolic panel     Status: Abnormal   Collection Time: 05/07/19  3:49 PM  Result Value Ref Range   Sodium 138 135 - 145 mmol/L   Potassium 3.6 3.5 - 5.1 mmol/L   Chloride 107 98 - 111 mmol/L   CO2 25 22 - 32 mmol/L   Glucose, Bld 96 70 - 99 mg/dL   BUN 9 6 - 20 mg/dL   Creatinine, Ser 0.55 0.44 - 1.00 mg/dL   Calcium 8.8 (L) 8.9 - 10.3 mg/dL   GFR calc non Af Amer >60 >60 mL/min   GFR calc Af Amer >60 >60 mL/min   Anion gap 6 5 - 15    Comment: Performed at [facility], [place] [address] [place], [place] [address]  CBC     Status: Abnormal   Collection Time: 05/07/19  3:49 PM  Result Value Ref Range   WBC 5.6 4.0 - 10.5 K/uL   RBC 3.96 3.87 - 5.11 MIL/uL   Hemoglobin 7.1 (L) 12.0 - 15.0 g/dL    Comment: Reticulocyte Hemoglobin testing may be clinically indicated, consider ordering this additional test [id number]    HCT 25.7 (L) 36.0 - 46.0 %   MCV 64.9 (L) 80.0 - 100.0 fL   MCH  17.9 (L) 26.0 - 34.0 pg   MCHC 27.6 (L) 30.0 - 36.0 g/dL   RDW 20.6 (H) 11.5 - 15.5 %   Platelets 429 (H) 150 - 400 K/uL    Comment: REPEATED TO VERIFY   nRBC 0.0 0.0 - 0.2 %    Comment: Performed at [facility], [place] [address], [place], [place] [address]  Troponin I (High Sensitivity)     Status: None   Collection Time: 05/07/19  3:49 PM  Result Value Ref Range   Troponin I (High Sensitivity) <2 <18 ng/L    Comment: (NOTE) Elevated high sensitivity troponin I (hsTnI) values and significant  changes across serial measurements may suggest ACS but many other  chronic and acute conditions are known to elevate hsTnI results.  Refer to the Links section for chest pain algorithms and additional  guidance. Performed at [facility], [place] [address] [place],  [address]   I-Stat beta hCG blood, ED     Status: None   Collection Time: 05/07/19  4:23 PM   Result Value Ref Range   I-stat hCG, quantitative <5.0 <5 mIU/mL   Comment 3            Comment:   GEST. AGE      CONC.  (mIU/mL)   <=1 WEEK        5 - 50     2 WEEKS       50 - 500     3 WEEKS       100 - 10,000     4 WEEKS     1,000 - 30,000        FEMALE AND NON-PREGNANT FEMALE:     LESS THAN 5 mIU/mL    EKG: EKG from 12/7 shows normal sinus rhythm with no new Q waves, T wave inversions or ST changes.  Old Q waves noted in lead III and aVF.  Assessment/Plan:  Anemia, iron deficiency CBC from yesterday significant for a hemoglobin of 7.1 is decreased from her hemoglobin of 8.6 noted 1.5 years ago.  Iron studies from 1.5 years ago are consistent with iron deficiency anemia.  She does not remember ever being diagnosed with iron deficiency anemia or been recommended to take iron supplements.  History and physical heart consistent with symptomatic iron deficiency anemia secondary to menorrhagia.  For a broader differential please see notes under chest pain. -Feraheme ordered today and scheduled -She is encouraged to take OCPs for improved control of her menorrhagia, OCPs ordered -Follow-up in 3 months for a repeat CBC  Chest pain The differential for her chest pain includes symptomatic anemia, GERD, anxiety.  Her labs are notable for a hemoglobin of 7.1.  No evidence of ischemic changes on EKG.  Troponin x1 is unremarkable.  No significant cardiac history in the family.  Will treat as symptomatic anemia and add an H2 blocker for possible contribution from GERD. -Feraheme as noted under anemia problem -Famotidine 20 mg daily for 1 week and monitor for symptom improvement.

## 2019-05-08 NOTE — Assessment & Plan Note (Signed)
CBC from yesterday significant for a hemoglobin of 7.1 is decreased from her hemoglobin of 8.6 noted 1.5 years ago.  Iron studies from 1.5 years ago are consistent with iron deficiency anemia.  She does not remember ever being diagnosed with iron deficiency anemia or been recommended to take iron supplements.  History and physical heart consistent with symptomatic iron deficiency anemia secondary to menorrhagia.  For a broader differential please see notes under chest pain. -Feraheme ordered today and scheduled -She is encouraged to take OCPs for improved control of her menorrhagia, OCPs ordered -Follow-up in 3 months for a repeat CBC

## 2019-05-16 ENCOUNTER — Other Ambulatory Visit (HOSPITAL_COMMUNITY): Payer: Self-pay | Admitting: *Deleted

## 2019-05-17 ENCOUNTER — Other Ambulatory Visit: Payer: Self-pay

## 2019-05-17 ENCOUNTER — Ambulatory Visit (HOSPITAL_COMMUNITY)
Admission: RE | Admit: 2019-05-17 | Discharge: 2019-05-17 | Disposition: A | Discharge status: Home / Self Care | Payer: Medicare Other | Source: Ambulatory Visit | Attending: Family Medicine | Admitting: Family Medicine

## 2019-05-17 DIAGNOSIS — D509 Iron deficiency anemia, unspecified: Secondary | ICD-10-CM | POA: Diagnosis not present

## 2019-05-17 MED ORDER — SODIUM CHLORIDE 0.9 % IV SOLN
510.0000 mg | Freq: Once | INTRAVENOUS | Status: AC
  Administered 2019-05-17: 510 mg via INTRAVENOUS
  Filled 2019-05-17: qty 17

## 2019-05-17 NOTE — Discharge Instructions (Signed)

## 2019-08-25 DIAGNOSIS — U071 COVID-19: Secondary | ICD-10-CM | POA: Diagnosis not present

## 2019-09-24 ENCOUNTER — Ambulatory Visit (INDEPENDENT_AMBULATORY_CARE_PROVIDER_SITE_OTHER): Payer: Medicare Other | Admitting: Family Medicine

## 2019-09-24 ENCOUNTER — Encounter: Payer: Self-pay | Admitting: Family Medicine

## 2019-09-24 ENCOUNTER — Other Ambulatory Visit (HOSPITAL_COMMUNITY)
Admission: RE | Admit: 2019-09-24 | Discharge: 2019-09-24 | Disposition: A | Discharge status: Home / Self Care | Payer: Medicare Other | Source: Ambulatory Visit | Attending: Family Medicine | Admitting: Family Medicine

## 2019-09-24 ENCOUNTER — Other Ambulatory Visit: Payer: Self-pay

## 2019-09-24 VITALS — BP 100/70 | HR 69 | Temp 98.1°F | Wt 185.0 lb

## 2019-09-24 DIAGNOSIS — M25442 Effusion, left hand: Secondary | ICD-10-CM | POA: Diagnosis not present

## 2019-09-24 DIAGNOSIS — Z113 Encounter for screening for infections with a predominantly sexual mode of transmission: Secondary | ICD-10-CM

## 2019-09-24 DIAGNOSIS — Z114 Encounter for screening for human immunodeficiency virus [HIV]: Secondary | ICD-10-CM | POA: Diagnosis not present

## 2019-09-24 DIAGNOSIS — Z1151 Encounter for screening for human papillomavirus (HPV): Secondary | ICD-10-CM | POA: Diagnosis not present

## 2019-09-24 DIAGNOSIS — Z124 Encounter for screening for malignant neoplasm of cervix: Secondary | ICD-10-CM | POA: Diagnosis not present

## 2019-09-24 DIAGNOSIS — Z7251 High risk heterosexual behavior: Secondary | ICD-10-CM

## 2019-09-24 HISTORY — DX: Effusion, left hand: M25.442

## 2019-09-24 LAB — POCT WET PREP (WET MOUNT)
Clue Cells Wet Prep Whiff POC: NEGATIVE
Trichomonas Wet Prep HPF POC: ABSENT

## 2019-09-24 NOTE — Assessment & Plan Note (Signed)
Acute and isolated edema of left 3rd digit which is now self resolving without intervention. Most likely differential is unintentional minor trauma to the digit causing tenosynovitis. Considered arthritis as other cause such OA and RA. No evidence of Heberdens or Bouchard's nodes, ulnar deviation, Swan neck deformity or Butonierre's deformity. -Recommended regular application of ice to affected area -Ibuprofen 400mg  TID -Follow up with PCP if no improvement or edema involves other joints.

## 2019-09-24 NOTE — Assessment & Plan Note (Addendum)
Patient is having unprotected intercourse with new female partner, unclear monogamy. GC/chlamydia, wet prep taken with pap smear today. HIV, Hep c and RPR labs drawn. Will inform patient of results.
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
## 2019-09-25 LAB — CYTOLOGY - PAP
Chlamydia: NEGATIVE
Comment: NEGATIVE
Comment: NEGATIVE
Comment: NORMAL
Diagnosis: NEGATIVE
High risk HPV: NEGATIVE
Neisseria Gonorrhea: NEGATIVE

## 2019-09-25 LAB — RPR: RPR Ser Ql: NONREACTIVE

## 2019-09-25 LAB — HIV ANTIBODY (ROUTINE TESTING W REFLEX): HIV Screen 4th Generation wRfx: NONREACTIVE

## 2019-10-01 ENCOUNTER — Encounter: Payer: Self-pay | Admitting: Family Medicine

## 2019-10-11 ENCOUNTER — Encounter: Payer: Self-pay | Admitting: Family Medicine

## 2020-01-03 ENCOUNTER — Ambulatory Visit: Payer: Medicaid Other | Admitting: Family Medicine

## 2020-01-03 ENCOUNTER — Encounter: Payer: Self-pay | Admitting: Family Medicine

## 2020-01-17 DIAGNOSIS — Z23 Encounter for immunization: Secondary | ICD-10-CM | POA: Diagnosis not present

## 2020-02-09 DIAGNOSIS — Z23 Encounter for immunization: Secondary | ICD-10-CM | POA: Diagnosis not present

## 2020-02-20 ENCOUNTER — Other Ambulatory Visit (HOSPITAL_COMMUNITY)
Admission: RE | Admit: 2020-02-20 | Discharge: 2020-02-20 | Disposition: A | Discharge status: Home / Self Care | Payer: Medicare Other | Source: Ambulatory Visit | Attending: Family Medicine | Admitting: Family Medicine

## 2020-02-20 ENCOUNTER — Ambulatory Visit (INDEPENDENT_AMBULATORY_CARE_PROVIDER_SITE_OTHER): Payer: Medicare Other | Admitting: Family Medicine

## 2020-02-20 ENCOUNTER — Other Ambulatory Visit: Payer: Self-pay

## 2020-02-20 VITALS — BP 114/62 | HR 71 | Wt 192.2 lb

## 2020-02-20 DIAGNOSIS — R5383 Other fatigue: Secondary | ICD-10-CM | POA: Diagnosis not present

## 2020-02-20 DIAGNOSIS — N939 Abnormal uterine and vaginal bleeding, unspecified: Secondary | ICD-10-CM

## 2020-02-20 DIAGNOSIS — D500 Iron deficiency anemia secondary to blood loss (chronic): Secondary | ICD-10-CM

## 2020-02-20 DIAGNOSIS — L732 Hidradenitis suppurativa: Secondary | ICD-10-CM | POA: Diagnosis not present

## 2020-02-20 LAB — POCT HEMOGLOBIN: Hemoglobin: 8 g/dL — AB (ref 11–14.6)

## 2020-02-20 LAB — POCT WET PREP (WET MOUNT)
Clue Cells Wet Prep Whiff POC: POSITIVE
Trichomonas Wet Prep HPF POC: ABSENT

## 2020-02-20 MED ORDER — DOXYCYCLINE HYCLATE 100 MG PO TABS: 100.0000 mg | ORAL_TABLET | Freq: Two times a day (BID) | ORAL | 0 refills | Status: AC

## 2020-02-20 MED ORDER — FERROUS GLUCONATE 324 (38 FE) MG PO TABS: 324.0000 mg | ORAL_TABLET | Freq: Two times a day (BID) | ORAL | 3 refills | Status: DC

## 2020-02-20 MED ORDER — MEDROXYPROGESTERONE ACETATE 10 MG PO TABS: 10.0000 mg | ORAL_TABLET | Freq: Every day | ORAL | 0 refills | Status: DC

## 2020-02-20 NOTE — Patient Instructions (Signed)
Thank you for coming to see me today. It was a pleasure. Today we talked about:   Stop Birth control pill. Start Provera for 10 days  Ultrasound booked for    Referral to dermatology Doxycycline for 14 days Warm compresses to affected area  Please follow-up with me Oct 28 at 920 am for endometrial biopsy and Mirena insertion  If you have any questions or concerns, please do not hesitate to call the office at [phone].  Best,   Carollee Leitz, MD Family Medicine Residency

## 2020-02-21 LAB — CBC WITH DIFFERENTIAL/PLATELET
Basophils Absolute: 0 10*3/uL (ref 0.0–0.2)
Basos: 1 %
EOS (ABSOLUTE): 0.1 10*3/uL (ref 0.0–0.4)
Eos: 2 %
Hematocrit: 30.7 % — ABNORMAL LOW (ref 34.0–46.6)
Hemoglobin: 8.4 g/dL — ABNORMAL LOW (ref 11.1–15.9)
Immature Grans (Abs): 0 10*3/uL (ref 0.0–0.1)
Immature Granulocytes: 0 %
Lymphocytes Absolute: 1.8 10*3/uL (ref 0.7–3.1)
Lymphs: 41 %
MCH: 19.4 pg — ABNORMAL LOW (ref 26.6–33.0)
MCHC: 27.4 g/dL — ABNORMAL LOW (ref 31.5–35.7)
MCV: 71 fL — ABNORMAL LOW (ref 79–97)
Monocytes Absolute: 0.3 10*3/uL (ref 0.1–0.9)
Monocytes: 7 %
Neutrophils Absolute: 2.2 10*3/uL (ref 1.4–7.0)
Neutrophils: 49 %
Platelets: 433 10*3/uL (ref 150–450)
RBC: 4.32 x10E6/uL (ref 3.77–5.28)
RDW: 17.1 % — ABNORMAL HIGH (ref 11.7–15.4)
WBC: 4.4 10*3/uL (ref 3.4–10.8)

## 2020-02-21 LAB — TSH: TSH: 0.869 u[IU]/mL (ref 0.450–4.500)

## 2020-02-22 LAB — CERVICOVAGINAL ANCILLARY ONLY
Chlamydia: NEGATIVE
Comment: NEGATIVE
Comment: NEGATIVE
Comment: NORMAL
Neisseria Gonorrhea: NEGATIVE
Trichomonas: NEGATIVE

## 2020-02-24 ENCOUNTER — Encounter: Payer: Self-pay | Admitting: Family Medicine

## 2020-02-24 DIAGNOSIS — N939 Abnormal uterine and vaginal bleeding, unspecified: Secondary | ICD-10-CM | POA: Insufficient documentation

## 2020-02-24 LAB — WOUND CULTURE: Organism ID, Bacteria: NONE SEEN

## 2020-02-24 NOTE — Progress Notes (Signed)
    SUBJECTIVE:   CHIEF COMPLAINT / HPI:  HS flare and heavy periods  HS flare up History of HS with surgical intervention under both axillary areas.  Today she reports painful knot inside vaginal area. She reports she is usually prescribed Doxy when having flares.   Denies any discharge from boil, vaginal discharge or urinary symptoms.  AUB Reports heavy menses.  Usually lasting 5-6 days and having to change super tampons and sanitary napkins every hr.  She is currently on OCP which she reports has not helped.  She does endorse some fatigue and dizziness when having her periods.  No dizziness at present.  She is sexually active with 1 partner for the past 2 years.  She has had tubal ligation.  PERTINENT  PMH / PSH:  HS AUB Anemia  OBJECTIVE:   BP 114/62   Pulse 71   Wt 192 lb 3.2 oz (87.2 kg)   LMP 02/04/2020 (Approximate)   SpO2 100%   BMI 34.05 kg/m    General: Alert and oriented, no apparent distress  Neck: nontender, no thyroid enlargement appreciated Cardiovascular: RRR with no murmurs noted Respiratory: CTA bilaterally  Gastrointestinal: Bowel sounds present. No abdominal pain EVE: hard movable lump noted on left lower labia, tender to touch and noted to have white head with small amount of white drainage. No abrasions noted SVE: normal cervix and vaginal vault, mod amount creamy discharge noted in vaginal vault Bimanual: cervix firm without CMT, pelvic muscles intact    ASSESSMENT/PLAN:   Chronic Hurley Stage III Hidradenitis Suppurativa HS lesion noted to left lower labia -Culture wound, negative -Refer to Derm -Doxycycline 200 mg BID x14/7 -Follow up with PCP as needed  Anemia, iron deficiency Last Hbg 7.1 and was infused with Feraheme 12/20. Currently asymptomatic. -Hbg 8 today -CBC, Hbg 8.4 -Start Ferrous Gluconate BID -Will recheck at next visit   Abnormal uterine bleeding (AUB) Continues to have heavy periods having to change tampons every hour  when on cycle.  OCP has not helped.  Given age cannot rule out endometrial cancer, but AUB could likely be from fibroids.  Last PAP 04/21 NLIM and HPV negative -Pelvic U/S with TVUS -TSH and CBC today -Stop OCP -Start Provera 10 mg daily x 10/7 -Appointment booked for endometrial biopsy and IUD insertion Oct 28 at [place], MD [place]

## 2020-02-24 NOTE — Assessment & Plan Note (Signed)
HS lesion noted to left lower labia -Culture wound, negative -Refer to Derm -Doxycycline 200 mg BID x14/7 -Follow up with PCP as needed

## 2020-02-24 NOTE — Assessment & Plan Note (Signed)
Continues to have heavy periods having to change tampons every hour when on cycle.  OCP has not helped.  Given age cannot rule out endometrial cancer, but AUB could likely be from fibroids.  Last PAP 04/21 NLIM and HPV negative -Pelvic U/S with TVUS -TSH and CBC today -Stop OCP -Start Provera 10 mg daily x 10/7 -Appointment booked for endometrial biopsy and IUD insertion Oct 28 at 920

## 2020-02-24 NOTE — Assessment & Plan Note (Signed)
Last Hbg 7.1 and was infused with Feraheme 12/20. Currently asymptomatic. -Hbg 8 today -CBC, Hbg 8.4 -Start Ferrous Gluconate BID -Will recheck at next visit

## 2020-02-27 ENCOUNTER — Ambulatory Visit
Admission: RE | Admit: 2020-02-27 | Discharge: 2020-02-27 | Disposition: A | Discharge status: Home / Self Care | Payer: Medicare Other | Source: Ambulatory Visit | Attending: Family Medicine | Admitting: Family Medicine

## 2020-02-27 ENCOUNTER — Telehealth: Payer: Self-pay | Admitting: Family Medicine

## 2020-02-27 ENCOUNTER — Other Ambulatory Visit: Payer: Self-pay | Admitting: Family Medicine

## 2020-02-27 DIAGNOSIS — N939 Abnormal uterine and vaginal bleeding, unspecified: Secondary | ICD-10-CM | POA: Diagnosis not present

## 2020-02-27 DIAGNOSIS — N852 Hypertrophy of uterus: Secondary | ICD-10-CM | POA: Diagnosis not present

## 2020-02-27 DIAGNOSIS — D259 Leiomyoma of uterus, unspecified: Secondary | ICD-10-CM | POA: Diagnosis not present

## 2020-02-27 NOTE — Telephone Encounter (Signed)
Called patient to discuss results of ultrasound and recommendation for pelvic MRI.  Discussed with patient that I think a GYNE referral would be appropriate at this time given concern for adenomyosis and poorly defined fibroid.  Patient ok with referral.  Will cancel upcoming appointment for Endometrial Biopsy and IUD insertion.  Can rebook pending GYNE recommendations.  Carollee Leitz, MD Family Medicine Residency

## 2020-03-20 ENCOUNTER — Telehealth: Payer: Self-pay | Admitting: *Deleted

## 2020-03-20 NOTE — Telephone Encounter (Signed)
Contacted pt and 9:20 appointment on 03/27/2020 kept for pt to follow up with Dr. Volanda Napoleon, the others scheduled that day were cancelled due to being referred to gyn. Kaveon Blatz Zimmerman Rumple, CMA

## 2020-03-20 NOTE — Telephone Encounter (Signed)
-----   Message from Carollee Leitz, MD sent at 03/19/2020 11:19 AM EDT ----- Lysle Rubens, I had this patient booked for one hr on Oct 28 for endometrial biopsy and IUD insertion.  I have since placed a referral for Gyne for her to be evaluated in Nov.  I would like her to keep one of those appointments on the 28 for follow up AUB but the other two can be canceled.  I had spoken to her and she is aware of the [facility] referral but I had originally told her that she did not need to follow up with me as I thought she would have been seen by Gyne before then.  Can you please call her and ask her what appointment she can keep on the 28? I'm in clinic tomorrow if you have any questions.  Thanks  Lavella Lemons

## 2020-03-25 NOTE — Progress Notes (Signed)
    SUBJECTIVE:   CHIEF COMPLAINT / HPI: follow up  AUB  AUB Completed 10 days Provera from last visit. Help with bleeding and pain. Currently on menses, stared couple of days ago.  Bleeding not as heavy. Scheduled to see Gyne 11/23 for follow up pelvic u/s results.    Anemia Denies any fatigue, dizziness, chest pain or SOB.  Last Hbg 8.0 and was started on Iron supplements. Tolerated medication for 1.5 weeks but stopped due to constipation.  PERTINENT  PMH / PSH:  AUB Uterine Fibroids  OBJECTIVE:   BP 100/60   Pulse 67   Ht 5\' 3"  (1.6 m)   Wt 195 lb 2 oz (88.5 kg)   LMP 03/26/2020   SpO2 100%   BMI 34.56 kg/m    General: Alert and oriented, no apparent distress  Cardiovascular: RRR with no murmurs noted Respiratory: CTA bilaterally  Gastrointestinal: Bowel sounds present. No abdominal pain   ASSESSMENT/PLAN:   Abnormal uterine bleeding (AUB) Continue current management. If continues to have heavy bleeding can restart Provera until seen by Gyne Follow up with Gyne 11/23 Ibuprofen and Tylenol for menstrual discomfort Follow up in 4 weeks  Anemia, iron deficiency Asymptomatic.  Last hemoglobin 8.0.  Not taking iron supplements Hemoglobin today 9.0, improving Encouraged to take iron supplements every other day Strict return precautions provided     Carollee Leitz, MD [place]

## 2020-03-26 NOTE — Patient Instructions (Addendum)
Thank you for coming to see me today. It was a pleasure.  Ibuprofen 600 mg every 8 hours Tylenol 1300 mg three times a day  If you have heavy bleeding with this period let me know and I can call in a prescription for Provera  Iron supplements every other day  Follow up with Gyne as scheduled.  Please follow-up with me the first week of December  If you have any questions or concerns, please do not hesitate to call the office at [phone].  Best,   Carollee Leitz, MD Family Medicine Residency

## 2020-03-27 ENCOUNTER — Other Ambulatory Visit: Payer: Self-pay

## 2020-03-27 ENCOUNTER — Ambulatory Visit (INDEPENDENT_AMBULATORY_CARE_PROVIDER_SITE_OTHER): Payer: Medicare Other | Admitting: Family Medicine

## 2020-03-27 ENCOUNTER — Encounter: Payer: Self-pay | Admitting: Family Medicine

## 2020-03-27 ENCOUNTER — Ambulatory Visit: Payer: Medicaid Other | Admitting: Family Medicine

## 2020-03-27 VITALS — BP 100/60 | HR 67 | Ht 63.0 in | Wt 195.1 lb

## 2020-03-27 DIAGNOSIS — N939 Abnormal uterine and vaginal bleeding, unspecified: Secondary | ICD-10-CM

## 2020-03-27 DIAGNOSIS — D500 Iron deficiency anemia secondary to blood loss (chronic): Secondary | ICD-10-CM | POA: Diagnosis not present

## 2020-03-27 LAB — POCT HEMOGLOBIN: Hemoglobin: 9 g/dL — AB (ref 11–14.6)

## 2020-03-27 MED ORDER — ACETAMINOPHEN ER 650 MG PO TBCR: 1300.0000 mg | EXTENDED_RELEASE_TABLET | Freq: Three times a day (TID) | ORAL | 0 refills | Status: DC | PRN

## 2020-03-27 MED ORDER — IBUPROFEN 600 MG PO TABS: 600.0000 mg | ORAL_TABLET | Freq: Three times a day (TID) | ORAL | 0 refills | Status: DC | PRN

## 2020-03-27 NOTE — Assessment & Plan Note (Signed)
Continue current management. If continues to have heavy bleeding can restart Provera until seen by Gyne Follow up with Gyne 11/23 Ibuprofen and Tylenol for menstrual discomfort Follow up in 4 weeks

## 2020-03-27 NOTE — Assessment & Plan Note (Signed)
Asymptomatic.  Last hemoglobin 8.0.  Not taking iron supplements Hemoglobin today 9.0, improving Encouraged to take iron supplements every other day Strict return precautions provided

## 2020-04-22 ENCOUNTER — Encounter: Payer: Self-pay | Admitting: Advanced Practice Midwife

## 2020-04-22 ENCOUNTER — Ambulatory Visit (INDEPENDENT_AMBULATORY_CARE_PROVIDER_SITE_OTHER): Payer: Medicare Other | Admitting: Advanced Practice Midwife

## 2020-04-22 ENCOUNTER — Other Ambulatory Visit (HOSPITAL_COMMUNITY)
Admission: RE | Admit: 2020-04-22 | Discharge: 2020-04-22 | Disposition: A | Discharge status: Home / Self Care | Payer: Medicare Other | Source: Ambulatory Visit | Attending: Advanced Practice Midwife | Admitting: Advanced Practice Midwife

## 2020-04-22 ENCOUNTER — Other Ambulatory Visit: Payer: Self-pay

## 2020-04-22 VITALS — BP 135/75 | HR 60 | Ht 63.0 in | Wt 192.0 lb

## 2020-04-22 DIAGNOSIS — N939 Abnormal uterine and vaginal bleeding, unspecified: Secondary | ICD-10-CM

## 2020-04-22 DIAGNOSIS — R9389 Abnormal findings on diagnostic imaging of other specified body structures: Secondary | ICD-10-CM | POA: Diagnosis not present

## 2020-04-22 LAB — POCT PREGNANCY, URINE: Preg Test, Ur: NEGATIVE

## 2020-04-22 NOTE — Patient Instructions (Signed)
Abnormal Uterine Bleeding Abnormal uterine bleeding means bleeding more than usual from your uterus. It can include:  Bleeding between periods.  Bleeding after sex.  Bleeding that is heavier than normal.  Periods that last longer than usual.  Bleeding after you have stopped having your period (menopause). There are many problems that may cause this. You should see a doctor for any kind of bleeding that is not normal. Treatment depends on the cause of the bleeding. Follow these instructions at home:  Watch your condition for any changes.  Do not use tampons, douche, or have sex, if your doctor tells you not to.  Change your pads often.  Get regular well-woman exams. Make sure they include a pelvic exam and cervical cancer screening.  Keep all follow-up visits as told by your doctor. This is important. Contact a doctor if:  The bleeding lasts more than one week.  You feel dizzy at times.  You feel like you are going to throw up (nauseous).  You throw up. Get help right away if:  You pass out.  You have to change pads every hour.  You have belly (abdominal) pain.  You have a fever.  You get sweaty.  You get weak.  You passing large blood clots from your vagina. Summary  Abnormal uterine bleeding means bleeding more than usual from your uterus.  There are many problems that may cause this. You should see a doctor for any kind of bleeding that is not normal.  Treatment depends on the cause of the bleeding. This information is not intended to replace advice given to you by your health care provider. Make sure you discuss any questions you have with your health care provider. Document Revised: 05/11/2016 Document Reviewed: 05/11/2016 Elsevier Patient Education  2020 [employer].

## 2020-04-22 NOTE — Progress Notes (Signed)
Subjective:     Patient ID: Victoria Rodriguez, female   DOB: May 10, 1981, 39 y.o.   MRN: [id number]  BERNIS STECHER is a 39 y.o. [id number] who is here today with abnormal uterine bleeding. She states that after her last child apprx 4 years ago her periods became more painful. Now in the last year they have been really heavy and painful. She saw her PCP and was given provera. She reports that with that medication the bleeding was about the same, but the pain was less than it had been. She only took that for 10 days.   Patient states that when she has her period she is unable to leave her house for 3 days because she will always soak through a pad and tampon.   Pelvic US on 02/27/2020 IMPRESSION: 1. Enlarged uterus with at least 1 discrete fibroid. There is heterogenous bulky enlargement of the anterior uterine fundus without well-defined border, potentially representing adenomyosis or poorly defined fibroid. Pelvic MRI could be considered for further evaluation.  2. Trace free fluid in the pelvis.  Adnexa are within normal limits.  Electronically Signed   By: Donavan Foil M.D.   On: 02/27/2020 15:26  Vaginal Bleeding The patient's primary symptoms include pelvic pain and vaginal bleeding. This is a new problem. The current episode started more than 1 month ago. The problem occurs constantly. The problem has been unchanged. The pain is severe. The problem affects both sides. She is not pregnant. Pertinent negatives include no chills or fever. Associated symptoms comments: Always has pain in there RLQ when she is on her period. . The vaginal bleeding is heavier than menses. She has been passing clots (Approx the size of a ping pong ball. ). Nothing aggravates the symptoms. She has tried NSAIDs and acetaminophen for the symptoms. The treatment provided mild relief. She uses tubal ligation for contraception. Her menstrual history has been regular. Her past medical history is significant for a  Cesarean section.     Review of Systems  Constitutional: Negative for chills and fever.  Genitourinary: Positive for pelvic pain and vaginal bleeding.       Objective:   Physical Exam Vitals and nursing note reviewed. Exam conducted with a chaperone present.  Constitutional:      Appearance: Normal appearance.  HENT:     Head: Normocephalic.     Mouth/Throat:     Lips: Pink.     Mouth: Mucous membranes are moist.  Cardiovascular:     Rate and Rhythm: Normal rate.  Pulmonary:     Effort: Pulmonary effort is normal.  Abdominal:     Palpations: Abdomen is soft.     Tenderness: There is no abdominal tenderness.  Genitourinary:    Comments:  External: no lesion Vagina: small amount of white discharge Cervix: pink, smooth, no CMT Uterus: NSSC Adnexa: NT  Skin:    General: Skin is warm and dry.  Neurological:     Mental Status: She is alert and oriented to person, place, and time.  Psychiatric:        Mood and Affect: Mood normal.        Behavior: Behavior normal.    ENDOMETRIAL BIOPSY     The indications for endometrial biopsy were reviewed.   Risks of the biopsy including cramping, bleeding, infection, uterine perforation, inadequate specimen and need for additional procedures  were discussed. The patient states she understands and agrees to undergo procedure today. Consent was signed. Time out was performed. Urine HCG  was negative.  During the pelvic exam, the cervix was prepped with Betadine. The 3 mm pipelle was introduced into the endometrial cavity without difficulty to a depth of 10 cm, and a moderate amount of tissue was obtained and sent to pathology. The instruments were removed from the patient's vagina. Minimal bleeding from the cervix was noted. The patient tolerated the procedure well. Routine post-procedure instructions were given to the patient.       Assessment:     1. Abnormal uterine bleeding   2. Abnormal pelvic ultrasound       Plan:     -  Patient had normal pap on 09/24/2019 - Endometrial biopsy today - Pelvic MRI ordered - Discussed options to include IUD, ablation or hysterectomy. Ultimate plan with depend on results from MRI/endometrial biopsy and only reviewed options today as an introduction to possible ways to help with her pain and bleeding  Marcille Buffy DNP, CNM  04/22/20  2:51 PM

## 2020-04-25 LAB — SURGICAL PATHOLOGY

## 2020-05-01 ENCOUNTER — Ambulatory Visit (HOSPITAL_COMMUNITY)
Admission: RE | Admit: 2020-05-01 | Discharge: 2020-05-01 | Disposition: A | Discharge status: Home / Self Care | Payer: Medicare Other | Source: Ambulatory Visit | Attending: Advanced Practice Midwife | Admitting: Advanced Practice Midwife

## 2020-05-01 ENCOUNTER — Other Ambulatory Visit: Payer: Self-pay

## 2020-05-01 DIAGNOSIS — N920 Excessive and frequent menstruation with regular cycle: Secondary | ICD-10-CM | POA: Diagnosis not present

## 2020-05-01 DIAGNOSIS — N939 Abnormal uterine and vaginal bleeding, unspecified: Secondary | ICD-10-CM | POA: Insufficient documentation

## 2020-05-01 DIAGNOSIS — D252 Subserosal leiomyoma of uterus: Secondary | ICD-10-CM | POA: Diagnosis not present

## 2020-05-01 DIAGNOSIS — M5127 Other intervertebral disc displacement, lumbosacral region: Secondary | ICD-10-CM | POA: Diagnosis not present

## 2020-05-01 MED ORDER — GADOBUTROL 1 MMOL/ML IV SOLN
9.0000 mL | Freq: Once | INTRAVENOUS | Status: AC | PRN
  Administered 2020-05-01: 9 mL via INTRAVENOUS

## 2020-05-01 NOTE — Progress Notes (Signed)
MRI order changed per radiology request. Allergies reviewed. Last BMP completed 05/07/19; no hx of kidney disease on record. Appt today at 1300.

## 2020-05-02 ENCOUNTER — Other Ambulatory Visit: Payer: Self-pay | Admitting: Advanced Practice Midwife

## 2020-05-15 ENCOUNTER — Ambulatory Visit (INDEPENDENT_AMBULATORY_CARE_PROVIDER_SITE_OTHER): Payer: Medicare Other | Admitting: Obstetrics & Gynecology

## 2020-05-15 ENCOUNTER — Other Ambulatory Visit: Payer: Self-pay

## 2020-05-15 ENCOUNTER — Encounter: Payer: Self-pay | Admitting: Obstetrics & Gynecology

## 2020-05-15 VITALS — BP 119/70 | HR 77 | Ht 63.0 in | Wt 186.3 lb

## 2020-05-15 DIAGNOSIS — N939 Abnormal uterine and vaginal bleeding, unspecified: Secondary | ICD-10-CM | POA: Diagnosis not present

## 2020-05-15 DIAGNOSIS — Z30014 Encounter for initial prescription of intrauterine contraceptive device: Secondary | ICD-10-CM | POA: Diagnosis not present

## 2020-05-15 DIAGNOSIS — D500 Iron deficiency anemia secondary to blood loss (chronic): Secondary | ICD-10-CM

## 2020-05-15 MED ORDER — LEVONORGESTREL 19.5 MCG/DAY IU IUD: INTRAUTERINE_SYSTEM | Freq: Once | INTRAUTERINE | Status: AC

## 2020-05-15 NOTE — Progress Notes (Signed)
    GYNECOLOGY OFFICE PROCEDURE NOTE  Victoria Rodriguez is a 39 y.o. [phone] here for discussion of management of menorrhagia, possible Liletta IUD insertion.  Last pap smear was on 08/2019 and was normal. She considered ablation vs. LNIUD and requests IUD to manage her symptoms. Endometrial biopsy and MRI results reviewed  IUD Insertion Procedure Note Patient identified, informed consent performed, consent signed.   Discussed risks of irregular bleeding, cramping, infection, malpositioning or misplacement of the IUD outside the uterus which may require further procedure such as laparoscopy. Time out was performed.  Urine pregnancy test negative.  Speculum placed in the vagina.  Cervix visualized.  Cleaned with Betadine x 2.  Grasped anteriorly with a single tooth tenaculum.  Uterus sounded to 8 cm.  Liletta IUD placed per manufacturer's recommendations.  Strings trimmed to 3 cm. Tenaculum was removed, good hemostasis noted.  Patient tolerated procedure well.   Patient was given post-procedure instructions. Patient was also asked to check IUD strings periodically and follow up in 4 weeks for IUD check.   [name], MD, [place] Attending [place], [facility] for [facility], [place]

## 2020-05-21 ENCOUNTER — Telehealth: Payer: Self-pay | Admitting: Family Medicine

## 2020-05-21 ENCOUNTER — Encounter: Payer: Self-pay | Admitting: Family Medicine

## 2020-05-21 DIAGNOSIS — N946 Dysmenorrhea, unspecified: Secondary | ICD-10-CM

## 2020-05-21 MED ORDER — IBUPROFEN 600 MG PO TABS: 600.0000 mg | ORAL_TABLET | Freq: Three times a day (TID) | ORAL | 1 refills | Status: DC | PRN

## 2020-05-21 NOTE — Telephone Encounter (Signed)
Called and spoke with patient, she notes she is has just started her menses and is having cramping, notes this is normal for her and does not feel it is worse than usual. Still passing gas, having normal bowel movement, urinating normally, eating and drinking normally. She notes both ibuprofen and tylenol have been helpful for her pain in the past. She had IUD placed on 05/15/20 and has not had a cycle yet with that. Sent ibuprofen to requested pharmacy. Discussed return precautions including fevers, vaginal discharge, heavy bleeding, worsening abdominal pain, inability to tolerate PO with nausea and vomiting. Answered all patient questions.  Yehuda Savannah MD (covering for Dr McDiarmid this week).

## 2020-06-16 ENCOUNTER — Ambulatory Visit: Payer: Medicare Other | Admitting: Obstetrics & Gynecology

## 2020-07-14 ENCOUNTER — Ambulatory Visit: Payer: Medicare Other | Admitting: Obstetrics & Gynecology

## 2020-07-14 ENCOUNTER — Telehealth: Payer: Self-pay

## 2020-07-14 NOTE — Telephone Encounter (Signed)
Called pt to follow up on missed appt; VM left. MyChart message sent. °

## 2020-07-20 DIAGNOSIS — R42 Dizziness and giddiness: Secondary | ICD-10-CM | POA: Insufficient documentation

## 2020-07-20 NOTE — Progress Notes (Signed)
Patient had late cancellation to her 07/21/2020 appointment. Patient also had a no-show in August 2021 (only 2 no shows within the last year).   Patient sent a letter regarding no-show policy on 79/07/8331.  Milus Banister, [place], PGY-3 07/22/2020 6:39 AM

## 2020-07-21 ENCOUNTER — Ambulatory Visit (INDEPENDENT_AMBULATORY_CARE_PROVIDER_SITE_OTHER): Payer: Medicare Other | Admitting: Family Medicine

## 2020-07-21 DIAGNOSIS — R42 Dizziness and giddiness: Secondary | ICD-10-CM

## 2020-07-21 DIAGNOSIS — Z5329 Procedure and treatment not carried out because of patient's decision for other reasons: Secondary | ICD-10-CM

## 2020-07-22 DIAGNOSIS — Z5329 Procedure and treatment not carried out because of patient's decision for other reasons: Secondary | ICD-10-CM | POA: Insufficient documentation

## 2020-07-22 DIAGNOSIS — Z91199 Patient's noncompliance with other medical treatment and regimen due to unspecified reason: Secondary | ICD-10-CM | POA: Insufficient documentation

## 2020-07-22 NOTE — Progress Notes (Deleted)
     SUBJECTIVE:   CHIEF COMPLAINT / HPI:   Victoria Rodriguez is a 40 y.o. female presents with dizziness and fatigue  She had IUD placed on 05/15/20 for ***. LMP  Dizziness Feeling dizzy for *** days. Dizziness is *** Feels like room spins: *** Lightheadedness when stands: *** Palpitations or heart racing: *** Prior dizziness: *** Medications tried: *** Taking blood thinners: ***  Symptoms Hearing Loss: *** Ear Pain or fullness: *** Nausea or vomiting: *** Vision difficulty or double vision: *** Falls: *** Head trauma: *** Weakness in arm or leg: *** Speaking problems: *** Headache: ***  ROS see HPI Smoking Status noted  PERTINENT  PMH / PSH:   Anemia, dysfunctional uterine bleeding  OBJECTIVE:   There were no vitals taken for this visit.   General: Alert, no acute distress Cardio: Normal S1 and S2, RRR, no r/m/g Pulm: CTAB, normal work of breathing Abdomen: Bowel sounds normal. Abdomen soft and non-tender.  Extremities: No peripheral edema.  Neuro: Cranial nerves grossly intact   ASSESSMENT/PLAN:   No problem-specific Assessment & Plan notes found for this encounter.     Lattie Haw, MD PGY-2 [place]

## 2020-07-23 ENCOUNTER — Ambulatory Visit: Payer: Medicare Other

## 2020-08-25 ENCOUNTER — Ambulatory Visit (INDEPENDENT_AMBULATORY_CARE_PROVIDER_SITE_OTHER): Payer: Medicare Other | Admitting: Family Medicine

## 2020-08-25 ENCOUNTER — Other Ambulatory Visit: Payer: Self-pay

## 2020-08-25 ENCOUNTER — Ambulatory Visit (INDEPENDENT_AMBULATORY_CARE_PROVIDER_SITE_OTHER): Payer: Medicare Other

## 2020-08-25 VITALS — BP 130/66 | HR 68 | Ht 63.0 in | Wt 193.0 lb

## 2020-08-25 DIAGNOSIS — L732 Hidradenitis suppurativa: Secondary | ICD-10-CM

## 2020-08-25 DIAGNOSIS — Z23 Encounter for immunization: Secondary | ICD-10-CM | POA: Diagnosis not present

## 2020-08-25 DIAGNOSIS — D500 Iron deficiency anemia secondary to blood loss (chronic): Secondary | ICD-10-CM | POA: Diagnosis not present

## 2020-08-25 MED ORDER — DOXYCYCLINE HYCLATE 100 MG PO TABS: 100.0000 mg | ORAL_TABLET | Freq: Two times a day (BID) | ORAL | 0 refills | Status: DC

## 2020-08-25 NOTE — Assessment & Plan Note (Signed)
-  Follow-up CBC, ferritin

## 2020-08-25 NOTE — Progress Notes (Addendum)
    SUBJECTIVE:   CHIEF COMPLAINT / HPI:   Hidradenitis suppurativa Victoria Rodriguez has a significant history of hidradenitis which is required previous surgical interventions of both axilla.  She continues to have flares under her arms, breasts and in her groin.  For roughly the past week, she has had a flareup that has been most bothersome under her left arm and bilateral groin.  She does not currently have any open, draining tracts she does have multiple cysts forming.  She is hoping to have a trial of antibiotics performed today.  Currently has an IUD for birth control.  Iron deficiency anemia Currently taking oral iron.  Would like to recheck labs today.  Continues to feel symptoms of fatigue.  Menorrhagia significantly improved since receiving her IUD.  PERTINENT  PMH / PSH: Hidradenitis  OBJECTIVE:   BP 130/66   Pulse 68   Ht 5\' 3"  (1.6 m)   Wt 193 lb (87.5 kg)   LMP 08/04/2020   SpO2 96%   BMI 34.19 kg/m    General: Alert and cooperative and appears to be in no acute distress Axilla: Without axillary demonstrate large surgical scars, mild evidence of inflammation/irritation.  No evidence of drainage tracks.  Left axilla does have 1 mildly tender cyst. Cardio: Normal S1 and S2, no S3 or S4. Rhythm is regular. No murmurs or rubs.   Pulm: Clear to auscultation bilaterally, no crackles, wheezing, or diminished breath sounds. Normal respiratory effort Pelvis: External pelvic exam demonstrated multiple cysts forming bilaterally.  No evidence of open drainage tracks at this time. Extremities: No peripheral edema. Warm/ well perfused.  Strong radial pulses. Neuro: Cranial nerves grossly intact  ASSESSMENT/PLAN:   Chronic Hurley Stage III Hidradenitis Suppurativa Flaring up again.  Not interested in lancing any cyst at this time. -Doxycycline 100 mg twice daily 10 days -Encouraged to return to clinic if symptoms do not improve or worsen  Anemia, iron deficiency -Follow-up CBC,  ferritin     Victoria Haymaker, MD [place]

## 2020-08-25 NOTE — Assessment & Plan Note (Signed)
Flaring up again.  Not interested in lancing any cyst at this time. -Doxycycline 100 mg twice daily 10 days -Encouraged to return to clinic if symptoms do not improve or worsen

## 2020-08-25 NOTE — Patient Instructions (Signed)
It was great to see you today.  Here is a quick review of the things we talked about:  Hidradenitis suppurativa: I am sorry you have had another flare.  I do think it is reasonable to start off with a course of antibiotics as usual.  I have called in a prescription for 10-day course of doxycycline.  I do recommend warm showers and trying to keep the draining areas open.  If this does not improve after a course of antibiotics, it may be necessary to drain some the affected area.  It sounds like you are already familiar with this procedure.  Please come back to clinic as needed.

## 2020-08-26 ENCOUNTER — Other Ambulatory Visit: Payer: Self-pay | Admitting: Family Medicine

## 2020-08-26 LAB — CBC
Hematocrit: 27.3 % — ABNORMAL LOW (ref 34.0–46.6)
Hemoglobin: 7.6 g/dL — ABNORMAL LOW (ref 11.1–15.9)
MCH: 18.7 pg — ABNORMAL LOW (ref 26.6–33.0)
MCHC: 27.8 g/dL — ABNORMAL LOW (ref 31.5–35.7)
MCV: 67 fL — ABNORMAL LOW (ref 79–97)
Platelets: 516 10*3/uL — ABNORMAL HIGH (ref 150–450)
RBC: 4.06 x10E6/uL (ref 3.77–5.28)
RDW: 19.9 % — ABNORMAL HIGH (ref 11.7–15.4)
WBC: 4.6 10*3/uL (ref 3.4–10.8)

## 2020-08-26 LAB — FERRITIN: Ferritin: 2 ng/mL — ABNORMAL LOW (ref 15–150)

## 2020-08-26 MED ORDER — FERROUS GLUCONATE 324 (38 FE) MG PO TABS: 324.0000 mg | ORAL_TABLET | ORAL | 3 refills | Status: DC

## 2020-08-26 NOTE — Progress Notes (Unsigned)
Victoria Rodriguez was called and informed that her labs from yesterday do demonstrate that she has a worsening anemia due to iron deficiency.  She reports that she has had this diagnosis before as a result of her uterine bleeding.  She did have an IUD placed about 3 months ago which she feels has improved her uterine bleeding.  She is not currently taking oral iron.  After some discussion, we decided to restart her oral iron and have her reassessed in clinic in 6 weeks.  Iron was sent to her pharmacy and she was encouraged to schedule a visit with her primary care provider in 6 weeks.    She was told to return to clinic sooner if she experienced worsening fatigue, shortness of breath or dizziness which would indicate symptomatic anemia.

## 2020-08-27 ENCOUNTER — Encounter: Payer: Self-pay | Admitting: Family Medicine

## 2020-09-01 ENCOUNTER — Encounter: Payer: Self-pay | Admitting: Family Medicine

## 2020-09-01 ENCOUNTER — Encounter: Payer: Self-pay | Admitting: General Practice

## 2020-09-02 ENCOUNTER — Other Ambulatory Visit: Payer: Self-pay

## 2020-09-02 ENCOUNTER — Encounter: Payer: Self-pay | Admitting: Family Medicine

## 2020-09-02 ENCOUNTER — Ambulatory Visit (INDEPENDENT_AMBULATORY_CARE_PROVIDER_SITE_OTHER): Payer: Medicare Other | Admitting: Family Medicine

## 2020-09-02 VITALS — BP 129/60 | HR 67 | Ht 63.0 in | Wt 193.6 lb

## 2020-09-02 DIAGNOSIS — L309 Dermatitis, unspecified: Secondary | ICD-10-CM | POA: Diagnosis not present

## 2020-09-02 DIAGNOSIS — D500 Iron deficiency anemia secondary to blood loss (chronic): Secondary | ICD-10-CM | POA: Diagnosis not present

## 2020-09-02 DIAGNOSIS — L732 Hidradenitis suppurativa: Secondary | ICD-10-CM | POA: Diagnosis not present

## 2020-09-02 MED ORDER — HYDROXYZINE HCL 25 MG PO TABS: 25.0000 mg | ORAL_TABLET | Freq: Three times a day (TID) | ORAL | 0 refills | Status: AC | PRN

## 2020-09-02 MED ORDER — CLINDAMYCIN HCL 300 MG PO CAPS: 300.0000 mg | ORAL_CAPSULE | Freq: Two times a day (BID) | ORAL | 0 refills | Status: AC

## 2020-09-02 MED ORDER — TRIAMCINOLONE ACETONIDE 0.1 % EX CREA: 1.0000 "application " | TOPICAL_CREAM | Freq: Two times a day (BID) | CUTANEOUS | 0 refills | Status: AC

## 2020-09-02 NOTE — Telephone Encounter (Signed)
Patient is calling to inform Dr. McDiarmid that she did receive the letter on mychart but when she faxed it to disability they contacted her to inform her that this letter needed to be faxed by our office attached to the form that was sent to be filled out on 08/27/20. They said they have not yet received this form.   Patient would like for Dr. Wendy Poet to print the letter and fax it with the form once it is completed.   The best call back with any questions is [phone].

## 2020-09-02 NOTE — Assessment & Plan Note (Signed)
As discussed with her, it is less likely her itching and dry skin is due to iron. Perhaps allergic reaction or contact dermatitis. Her iron prescription was for every other day, but she was taking it daily. I advised her to attempt every other day regimen since she need this to improve her anemia. Return to clinic in 4-8 weeks for repeat lab. ED precaution discussed regarding her anemia. She agreed with the plan.

## 2020-09-02 NOTE — Progress Notes (Signed)
    SUBJECTIVE:   CHIEF COMPLAINT / HPI:   Hidradenitis/Allergic reaction:  Her provider saw the patient for Hidradenitis and was started on Doxy. However, this medication makes her sick to her stomach. She experienced a similar presentation while on this medication in the past. She has since d/ced the meds.   Anemia: She was started on an iron supplement about a week ago. However, she felt this medication makes her itch with a rash. She uses Benedryl prn with no improvement. She was taking her iron pill daily and stopped taking it about three days ago. However, her itching persists with no improvement. No other concerns. No acute bleed.   PERTINENT  PMH / PSH: PMX reviewed  OBJECTIVE:   BP 129/60   Pulse 67   Ht 5\' 3"  (1.6 m)   Wt 193 lb 9.6 oz (87.8 kg)   LMP 08/04/2020   SpO2 99%   BMI 34.29 kg/m   Physical Exam Vitals and nursing note reviewed. Chaperone present: Lavell Anchors.  Cardiovascular:     Rate and Rhythm: Normal rate and regular rhythm.     Pulses: Normal pulses.     Heart sounds: Normal heart sounds. No murmur heard.   Pulmonary:     Effort: Pulmonary effort is normal. No respiratory distress.     Breath sounds: Normal breath sounds. No wheezing.  Abdominal:     General: Abdomen is flat. Bowel sounds are normal. There is no distension.     Palpations: Abdomen is soft. There is no mass.     Tenderness: There is no abdominal tenderness.  Skin:          ASSESSMENT/PLAN:   Hidradenitis Hurtley stage II Discussed topical vs oral. Given the severity of her symptoms, she will benefit from oral A/B. D/C Doxy. Started Clindamycin. Consider adding Rifampin after consulting with a dermatologist. Might benefit from surgical or derm referral. F/U soon if there is no improvement.  Anemia, iron deficiency As discussed with her, it is less likely her itching and dry skin is due to iron. Perhaps allergic reaction or contact dermatitis. Her iron  prescription was for every other day, but she was taking it daily. I advised her to attempt every other day regimen since she need this to improve her anemia. Return to clinic in 4-8 weeks for repeat lab. ED precaution discussed regarding her anemia. She agreed with the plan.   Skin rash: Allergy vs dermatitis. Less likely related to iron supplement. Hydroxyzine and Triamcinolone prescribed. F/U as needed.  Andrena Mews, MD [place]

## 2020-09-02 NOTE — Assessment & Plan Note (Signed)
Hurtley stage II Discussed topical vs oral. Given the severity of her symptoms, she will benefit from oral A/B. D/C Doxy. Started Clindamycin. Consider adding Rifampin after consulting with a dermatologist. Might benefit from surgical or derm referral. F/U soon if there is no improvement.

## 2020-09-02 NOTE — Patient Instructions (Signed)
Hidradenitis Suppurativa Hidradenitis suppurativa is a long-term (chronic) skin disease. It is similar to a severe form of acne, but it affects areas of the body where acne would be unusual, especially areas of the body where skin rubs against skin and becomes moist. These include:  Underarms.  Groin.  Genital area.  Buttocks.  Upper thighs.  Breasts. Hidradenitis suppurativa may start out as small lumps or pimples caused by blocked sweat glands or hair follicles. Pimples may develop into deep sores that break open (rupture) and drain pus. Over time, affected areas of skin may thicken and become scarred. This condition is rare and does not spread from person to person (non-contagious). What are the causes? The exact cause of this condition is not known. It may be related to:  Female and female hormones.  An overactive disease-fighting system (immune system). The immune system may over-react to blocked hair follicles or sweat glands and cause swelling and pus-filled sores. What increases the risk? You are more likely to develop this condition if you:  Are female.  Are 11-55 years old.  Have a family history of hidradenitis suppurativa.  Have a personal history of acne.  Are overweight.  Smoke.  Take the medicine lithium. What are the signs or symptoms? The first symptoms are usually painful bumps in the skin, similar to pimples. The condition may get worse over time (progress), or it may only cause mild symptoms. If the disease progresses, symptoms may include:  Skin bumps getting bigger and growing deeper into the skin.  Bumps rupturing and draining pus.  Itchy, infected skin.  Skin getting thicker and scarred.  Tunnels under the skin (fistulas) where pus drains from a bump.  Pain during daily activities, such as pain during walking if your groin area is affected.  Emotional problems, such as stress or depression. This condition may affect your appearance and your  ability or willingness to wear certain clothes or do certain activities. How is this diagnosed? This condition is diagnosed by a health care provider who specializes in skin diseases (dermatologist). You may be diagnosed based on:  Your symptoms and medical history.  A physical exam.  Testing a pus sample for infection.  Blood tests. How is this treated? Your treatment will depend on how severe your symptoms are. The same treatment will not work for everybody with this condition. You may need to try several treatments to find what works best for you. Treatment may include:  Cleaning and bandaging (dressing) your wounds as needed.  Lifestyle changes, such as new skin care routines.  Taking medicines, such as: ? Antibiotics. ? Acne medicines. ? Medicines to reduce the activity of the immune system. ? A diabetes medicine (metformin). ? Birth control pills, for women. ? Steroids to reduce swelling and pain.  Working with a mental health care provider, if you experience emotional distress due to this condition. If you have severe symptoms that do not get better with medicine, you may need surgery. Surgery may involve:  Using a laser to clear the skin and remove hair follicles.  Opening and draining deep sores.  Removing the areas of skin that are diseased and scarred. Follow these instructions at home: Medicines  Take over-the-counter and prescription medicines only as told by your health care provider.  If you were prescribed an antibiotic medicine, take it as told by your health care provider. Do not stop taking the antibiotic even if your condition improves.   Skin care  If you have open wounds,   cover them with a clean dressing as told by your health care provider. Keep wounds clean by washing them gently with soap and water when you bathe.  Do not shave the areas where you get hidradenitis suppurativa.  Do not wear deodorant.  Wear loose-fitting clothes.  Try to avoid  getting overheated or sweaty. If you get sweaty or wet, change into clean, dry clothes as soon as you can.  To help relieve pain and itchiness, cover sore areas with a warm, clean washcloth (warm compress) for 5-10 minutes as often as needed.  If told by your health care provider, take a bleach bath twice a week: ? Fill your bathtub halfway with water. ? Pour in  cup of unscented household bleach. ? Soak in the tub for 5-10 minutes. ? Only soak from the neck down. Avoid water on your face and hair. ? Shower to rinse off the bleach from your skin. General instructions  Learn as much as you can about your disease so that you have an active role in your treatment. Work closely with your health care provider to find treatments that work for you.  If you are overweight, work with your health care provider to lose weight as recommended.  Do not use any products that contain nicotine or tobacco, such as cigarettes and e-cigarettes. If you need help quitting, ask your health care provider.  If you struggle with living with this condition, talk with your health care provider or work with a mental health care provider as recommended.  Keep all follow-up visits as told by your health care provider. This is important. Where to find more information  Hidradenitis Suppurativa Foundation, Inc.: https://www.hs-foundation.org/  American Academy of Dermatology: https://www.aad.org Contact a health care provider if you have:  A flare-up of hidradenitis suppurativa.  A fever or chills.  Trouble controlling your symptoms at home.  Trouble doing your daily activities because of your symptoms.  Trouble dealing with emotional problems related to your condition. Summary  Hidradenitis suppurativa is a long-term (chronic) skin disease. It is similar to a severe form of acne, but it affects areas of the body where acne would be unusual.  The first symptoms are usually painful bumps in the skin, similar  to pimples. The condition may only cause mild symptoms, or it may get worse over time (progress).  If you have open wounds, cover them with a clean dressing as told by your health care provider. Keep wounds clean by washing them gently with soap and water when you bathe.  Besides skin care, treatment may include medicines, laser treatment, and surgery. This information is not intended to replace advice given to you by your health care provider. Make sure you discuss any questions you have with your health care provider. Document Revised: 03/11/2020 Document Reviewed: 03/11/2020 Elsevier Patient Education  2021 Elsevier Inc.  

## 2020-09-14 NOTE — Progress Notes (Signed)
Subjective:   Patient ID: Victoria Rodriguez    DOB: 31-Mar-1981, 40 y.o. female   MRN: [id number]  Victoria Rodriguez is a 40 y.o. female with a history of hidradenitis, AUB, iron deficiency anemia, obesity here for headaches  HEADACHE Headache started over the last few months, worse during her menstrual cycle. Notes history of this a few years ago where this happened, they were here and there. Pain is located along bilateral front. No improvement with motrin. She notes waking up with the headache. Nothing seems to make it better, less intense when she takes medicine. Minimal improvement with tylenol. Mild photophobia but no phonophobia. She can sleep but will wake up with the same headache. Intensity has not changed intensity. Denies cough, congestion, ear pain, weakness, vision changes. Medications tried: tylenol, motrin Patient thinks cause of headache might be: maybe the iron deficiency and anemia is contributing.  She notes her menstrual cycle is still very heavy. It has improved some with the the IUD but still very heavy.  Head trauma: no Sudden onset: no Previous similar headaches: years ago that were mild Taking blood thinners: no History of cancer: no  Symptoms Nose congestion stuffiness: no Nausea vomiting: no Photophobia: very mild Noise sensitivity: no Double vision or loss of vision: no Fever: no Neck Stiffness: no Trouble walking or speaking: no  Review of Systems:  Per HPI.   Objective:   BP 116/76   Pulse 86   Ht 5\' 3"  (1.6 m)   Wt 198 lb (89.8 kg)   LMP 09/09/2020 (Exact Date)   SpO2 100%   BMI 35.07 kg/m  Vitals and nursing note reviewed.  General: pleasant middle aged woman, sitting comfortably in exam chair, well nourished, well developed, in no acute distress with non-toxic appearance HEENT: normocephalic, atraumatic, moist mucous membranes, oropharynx clear without erythema or exudate, TM normal bilaterally  CV: regular rate and rhythm without  murmurs, rubs, or gallops, no lower extremity edema Lungs: clear to auscultation bilaterally with normal work of breathing on room air, speaking in full sentences Skin: warm, dry Extremities: warm and well perfused, normal tone MSK:  strength intact, gait normal Neuro: Alert and oriented, speech normal, PERRL, EOMI, CN grossly intact, UE and LE strength equal and normal  Assessment & Plan:   Bilateral headaches Acute on chronic. Differential at this time includes headache secondary to severe iron deficiency anemia vs tension type headache. Normal neurologic exam today thus low suspicion for intracranial pathology. No infectious symptoms and normal HEENT exam thus unlikely contributing. No physical exam findings concerning for giant cell arteritis. POC Hgb 7.6. Long discussion regarding suspected pathology and recommended next steps. - Will obtain follow up CBC and ferritin. Likely benefit from iron infusion but will wait on results.  - Recommended follow up with Gyn for more definitive management of menorrhagia. Patient open to hysterectomy.  - Start QOD iron supplement.  - Stay well hydrated.  - Recommended Excedrin PRN for headache. Patient voiced understanding and agreement with plan. - Red flag symptoms discussed for return  Anemia, iron deficiency Chronic. Mildly symptomatic with fatigue. In setting of menorrhagia. LMP 09/09/20. Not consistent with iron supplement. POC Hgb 7.6. MR Pelvis in 04/2020 notable for adenomyosis and small subserosal fibroids. Only mild improvement with IUD.  Long discussion regarding her iron deficiency anemia and need for better management of her menorrhagia. She voiced interest in hysterectomy. - CBC and ferritin today - likely schedule iron transfusion - recommended QOD iron supplement - follow  up with Gyn to discuss definitve treatment - stay hydrated  Orders Placed This Encounter  Procedures  . Ferritin  . CBC  . POCT hemoglobin    Associate with  Z13.0   No orders of the defined types were placed in this encounter.     Mina Marble, DO PGY-3, [facility] 09/16/2020 1:22 PM

## 2020-09-16 ENCOUNTER — Other Ambulatory Visit: Payer: Self-pay

## 2020-09-16 ENCOUNTER — Encounter: Payer: Self-pay | Admitting: Family Medicine

## 2020-09-16 ENCOUNTER — Ambulatory Visit (INDEPENDENT_AMBULATORY_CARE_PROVIDER_SITE_OTHER): Payer: Medicare Other | Admitting: Family Medicine

## 2020-09-16 VITALS — BP 116/76 | HR 86 | Ht 63.0 in | Wt 198.0 lb

## 2020-09-16 DIAGNOSIS — R519 Headache, unspecified: Secondary | ICD-10-CM | POA: Insufficient documentation

## 2020-09-16 DIAGNOSIS — D500 Iron deficiency anemia secondary to blood loss (chronic): Secondary | ICD-10-CM | POA: Diagnosis not present

## 2020-09-16 HISTORY — DX: Headache, unspecified: R51.9

## 2020-09-16 LAB — POCT HEMOGLOBIN: Hemoglobin: 7.6 g/dL — AB (ref 11–14.6)

## 2020-09-16 NOTE — Assessment & Plan Note (Signed)
Acute on chronic. Differential at this time includes headache secondary to severe iron deficiency anemia vs tension type headache. Normal neurologic exam today thus low suspicion for intracranial pathology. No infectious symptoms and normal HEENT exam thus unlikely contributing. No physical exam findings concerning for giant cell arteritis. POC Hgb 7.6. Long discussion regarding suspected pathology and recommended next steps. - Will obtain follow up CBC and ferritin. Likely benefit from iron infusion but will wait on results.  - Recommended follow up with Gyn for more definitive management of menorrhagia. Patient open to hysterectomy.  - Start QOD iron supplement.  - Stay well hydrated.  - Recommended Excedrin PRN for headache. Patient voiced understanding and agreement with plan. - Red flag symptoms discussed for return

## 2020-09-16 NOTE — Assessment & Plan Note (Signed)
Chronic. Mildly symptomatic with fatigue. In setting of menorrhagia. LMP 09/09/20. Not consistent with iron supplement. POC Hgb 7.6. MR Pelvis in 04/2020 notable for adenomyosis and small subserosal fibroids. Only mild improvement with IUD.  Long discussion regarding her iron deficiency anemia and need for better management of her menorrhagia. She voiced interest in hysterectomy. - CBC and ferritin today - likely schedule iron transfusion - recommended QOD iron supplement - follow up with Gyn to discuss definitve treatment - stay hydrated

## 2020-09-16 NOTE — Patient Instructions (Signed)
I suspect that your headaches are likely due from your iron deficiency anemia.  I recommend that you try to stay well-hydrated and you can use his Excedrin as needed to help with headaches. I would recommend starting a every other day iron supplement. I am getting labs today to check your anemia level.  However, the best treatment would be to solve your heavy menstrual bleeding.  I recommend that you follow-up with your gynecologist for further discussion on definitive treatment options as this is likely not going to improve anytime soon.  I will call you with the results of your labs and next steps.

## 2020-09-17 LAB — CBC
Hematocrit: 27.8 % — ABNORMAL LOW (ref 34.0–46.6)
Hemoglobin: 7.7 g/dL — ABNORMAL LOW (ref 11.1–15.9)
MCH: 18.7 pg — ABNORMAL LOW (ref 26.6–33.0)
MCHC: 27.7 g/dL — ABNORMAL LOW (ref 31.5–35.7)
MCV: 68 fL — ABNORMAL LOW (ref 79–97)
Platelets: 525 10*3/uL — ABNORMAL HIGH (ref 150–450)
RBC: 4.11 x10E6/uL (ref 3.77–5.28)
RDW: 20.4 % — ABNORMAL HIGH (ref 11.7–15.4)
WBC: 4.2 10*3/uL (ref 3.4–10.8)

## 2020-09-17 LAB — FERRITIN: Ferritin: 3 ng/mL — ABNORMAL LOW (ref 15–150)

## 2020-09-19 ENCOUNTER — Other Ambulatory Visit: Payer: Self-pay | Admitting: Family Medicine

## 2020-09-19 DIAGNOSIS — D500 Iron deficiency anemia secondary to blood loss (chronic): Secondary | ICD-10-CM

## 2020-09-19 MED ORDER — FERROUS GLUCONATE 324 (38 FE) MG PO TABS: 324.0000 mg | ORAL_TABLET | ORAL | 3 refills | Status: DC

## 2020-09-21 ENCOUNTER — Other Ambulatory Visit: Payer: Self-pay

## 2020-09-21 ENCOUNTER — Encounter (HOSPITAL_COMMUNITY): Payer: Self-pay | Admitting: Emergency Medicine

## 2020-09-21 ENCOUNTER — Ambulatory Visit (HOSPITAL_COMMUNITY)
Admission: EM | Admit: 2020-09-21 | Discharge: 2020-09-21 | Disposition: A | Discharge status: Home / Self Care | Payer: Medicare Other | Attending: Student | Admitting: Student

## 2020-09-21 DIAGNOSIS — L02214 Cutaneous abscess of groin: Secondary | ICD-10-CM | POA: Insufficient documentation

## 2020-09-21 DIAGNOSIS — B9689 Other specified bacterial agents as the cause of diseases classified elsewhere: Secondary | ICD-10-CM | POA: Diagnosis not present

## 2020-09-21 DIAGNOSIS — Z113 Encounter for screening for infections with a predominantly sexual mode of transmission: Secondary | ICD-10-CM | POA: Insufficient documentation

## 2020-09-21 DIAGNOSIS — Z9851 Tubal ligation status: Secondary | ICD-10-CM | POA: Insufficient documentation

## 2020-09-21 DIAGNOSIS — N760 Acute vaginitis: Secondary | ICD-10-CM | POA: Diagnosis not present

## 2020-09-21 DIAGNOSIS — D509 Iron deficiency anemia, unspecified: Secondary | ICD-10-CM

## 2020-09-21 DIAGNOSIS — L732 Hidradenitis suppurativa: Secondary | ICD-10-CM | POA: Insufficient documentation

## 2020-09-21 MED ORDER — METRONIDAZOLE 500 MG PO TABS: 500.0000 mg | ORAL_TABLET | Freq: Two times a day (BID) | ORAL | 0 refills | Status: DC
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
## 2020-09-21 NOTE — ED Triage Notes (Signed)
Pt presents today with c/o of lower abdominal pain described as "sharp, intermittent" that began yesterday. Denies n/v/d. +vag discharge, denies dysuria x 2 days.

## 2020-09-21 NOTE — Discharge Instructions (Addendum)
-  Flagyl (metronidazole), two pills daily for 7 days. This is the treatment for BV.  Make sure to avoid alcohol while on this medication as it will make you very nauseous. -Continue the clindamycin that you take daily. -Continue warm compresses to encourage pus to come out at home. -We are testing for gonorrhea, chlamydia, trichomonas, BV, yeast.  We will call you if anything else is positive and can send additional treatment if necessary.

## 2020-09-22 LAB — CERVICOVAGINAL ANCILLARY ONLY
Bacterial Vaginitis (gardnerella): POSITIVE — AB
Candida Glabrata: NEGATIVE
Candida Vaginitis: NEGATIVE
Chlamydia: NEGATIVE
Comment: NEGATIVE
Comment: NEGATIVE
Comment: NEGATIVE
Comment: NEGATIVE
Comment: NEGATIVE
Comment: NORMAL
Neisseria Gonorrhea: NEGATIVE
Trichomonas: NEGATIVE

## 2020-09-30 NOTE — Discharge Instructions (Signed)
Ferumoxytol injection What is this medicine? FERUMOXYTOL is an iron complex. Iron is used to make healthy red blood cells, which carry oxygen and nutrients throughout the body. This medicine is used to treat iron deficiency anemia. This medicine may be used for other purposes; ask your health care provider or pharmacist if you have questions. COMMON BRAND NAME(S): Feraheme What should I tell my health care provider before I take this medicine? They need to know if you have any of these conditions:  anemia not caused by low iron levels  high levels of iron in the blood  magnetic resonance imaging (MRI) test scheduled  an unusual or allergic reaction to iron, other medicines, foods, dyes, or preservatives  pregnant or trying to get pregnant  breast-feeding How should I use this medicine? This medicine is for injection into a vein. It is given by a health care professional in a hospital or clinic setting. Talk to your pediatrician regarding the use of this medicine in children. Special care may be needed. Overdosage: If you think you have taken too much of this medicine contact a poison control center or emergency room at once. NOTE: This medicine is only for you. Do not share this medicine with others. What if I miss a dose? It is important not to miss your dose. Call your doctor or health care professional if you are unable to keep an appointment. What may interact with this medicine? This medicine may interact with the following medications:  other iron products This list may not describe all possible interactions. Give your health care provider a list of all the medicines, herbs, non-prescription drugs, or dietary supplements you use. Also tell them if you smoke, drink alcohol, or use illegal drugs. Some items may interact with your medicine. What should I watch for while using this medicine? Visit your doctor or healthcare professional regularly. Tell your doctor or healthcare  professional if your symptoms do not start to get better or if they get worse. You may need blood work done while you are taking this medicine. You may need to follow a special diet. Talk to your doctor. Foods that contain iron include: whole grains/cereals, dried fruits, beans, or peas, leafy green vegetables, and organ meats (liver, kidney). What side effects may I notice from receiving this medicine? Side effects that you should report to your doctor or health care professional as soon as possible:  allergic reactions like skin rash, itching or hives, swelling of the face, lips, or tongue  breathing problems  changes in blood pressure  feeling faint or lightheaded, falls  fever or chills  flushing, sweating, or hot feelings  swelling of the ankles or feet Side effects that usually do not require medical attention (report to your doctor or health care professional if they continue or are bothersome):  diarrhea  headache  nausea, vomiting  stomach pain This list may not describe all possible side effects. Call your doctor for medical advice about side effects. You may report side effects to FDA at 1-800-FDA-1088. Where should I keep my medicine? This drug is given in a hospital or clinic and will not be stored at home. NOTE: This sheet is a summary. It may not cover all possible information. If you have questions about this medicine, talk to your doctor, pharmacist, or health care provider.  2021 Elsevier/Gold Standard (2016-07-05 20:21:10)  

## 2020-10-01 ENCOUNTER — Other Ambulatory Visit: Payer: Self-pay

## 2020-10-01 ENCOUNTER — Ambulatory Visit (HOSPITAL_COMMUNITY)
Admission: RE | Admit: 2020-10-01 | Discharge: 2020-10-01 | Disposition: A | Discharge status: Home / Self Care | Payer: Medicare Other | Source: Ambulatory Visit | Attending: Family Medicine | Admitting: Family Medicine

## 2020-10-01 DIAGNOSIS — D500 Iron deficiency anemia secondary to blood loss (chronic): Secondary | ICD-10-CM | POA: Diagnosis not present

## 2020-10-01 MED ORDER — FERUMOXYTOL INJECTION 510 MG/17 ML
510.0000 mg | Freq: Once | INTRAVENOUS | Status: AC
  Administered 2020-10-01: 510 mg via INTRAVENOUS
  Filled 2020-10-01: qty 510

## 2020-10-15 ENCOUNTER — Other Ambulatory Visit: Payer: Self-pay

## 2020-10-15 ENCOUNTER — Encounter: Payer: Self-pay | Admitting: Obstetrics & Gynecology

## 2020-10-15 ENCOUNTER — Ambulatory Visit (INDEPENDENT_AMBULATORY_CARE_PROVIDER_SITE_OTHER): Payer: Medicare Other | Admitting: Obstetrics & Gynecology

## 2020-10-15 VITALS — BP 123/84 | HR 75 | Wt 194.1 lb

## 2020-10-15 DIAGNOSIS — N939 Abnormal uterine and vaginal bleeding, unspecified: Secondary | ICD-10-CM | POA: Diagnosis not present

## 2020-10-15 DIAGNOSIS — Z30431 Encounter for routine checking of intrauterine contraceptive device: Secondary | ICD-10-CM | POA: Diagnosis not present

## 2020-10-15 MED ORDER — NORGESTIMATE-ETH ESTRADIOL 0.25-35 MG-MCG PO TABS: 1.0000 | ORAL_TABLET | Freq: Every day | ORAL | 11 refills | Status: DC
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
## 2020-10-15 NOTE — Patient Instructions (Signed)
Abnormal Uterine Bleeding Abnormal uterine bleeding means bleeding more than usual from your womb (uterus). It can include:  Bleeding between menstrual periods.  Bleeding after sex.  Bleeding that is heavier than normal.  Menstrual periods that last longer than usual.  Bleeding after you have stopped having your menstrual period (menopause). There are many problems that may cause this. You should see a doctor for any kind of bleeding that is not normal. Treatment depends on the cause of the bleeding. Follow these instructions at home: Medicines  Take over-the-counter and prescription medicines only as told by your doctor.  Tell your doctor about other medicines that you take. ? If told by your doctor, stop taking aspirin or medicines that have aspirin in them. These medicines can make you bleed more.  You may be given iron pills to replace iron that your body loses because of this condition. Take them as told by your doctor. Managing constipation If you are taking iron pills, you may have trouble pooping (constipation). To prevent or treat trouble pooping, you may need to:  Drink enough fluid to keep your pee (urine) pale yellow.  Take over-the-counter or prescription medicines.  Eat foods that are high in fiber. These include beans, whole grains, and fresh fruits and vegetables.  Limit foods that are high in fat and sugar. These include fried or sweet foods. General instructions  Watch your condition for any changes.  Do not use tampons, douche, or have sex, if your doctor tells you not to.  Change your pads often.  Get regular exams. This includes pelvic exams and cervical cancer screenings. ? It is up to you to get the results of any tests that are done. Ask your doctor, or the department that is doing the tests, when your results will be ready.  Keep all follow-up visits as told by your doctor. This is important. Contact a doctor if:  The bleeding lasts more than 1  week.  You feel dizzy at times.  You feel like you may vomit (nausea).  You vomit.  You feel light-headed or weak.  Your symptoms get worse. Get help right away if:  You pass out.  You have to change pads every hour.  You have pain in your belly.  You have a fever or chills.  You get sweaty.  You get weak.  You pass large blood clots from your vagina. Summary  Abnormal uterine bleeding means bleeding more than usual from your womb (uterus).  Any kind of bleeding that is not normal should be checked by a doctor.  Treatment depends on the cause of the bleeding.  Get help right away if you pass out, you have to change pads every hour, or you pass large blood clots from your vagina. This information is not intended to replace advice given to you by your health care provider. Make sure you discuss any questions you have with your health care provider. Document Revised: 03/20/2019 Document Reviewed: 03/20/2019 Elsevier Patient Education  2021 [employer] Inc[employer]  

## 2020-10-21 ENCOUNTER — Other Ambulatory Visit: Payer: Self-pay

## 2020-10-21 ENCOUNTER — Ambulatory Visit (HOSPITAL_COMMUNITY)
Admission: RE | Admit: 2020-10-21 | Discharge: 2020-10-21 | Disposition: A | Discharge status: Home / Self Care | Payer: Medicare Other | Source: Ambulatory Visit | Attending: Obstetrics & Gynecology | Admitting: Obstetrics & Gynecology

## 2020-10-21 DIAGNOSIS — N852 Hypertrophy of uterus: Secondary | ICD-10-CM | POA: Diagnosis not present

## 2020-10-21 DIAGNOSIS — N858 Other specified noninflammatory disorders of uterus: Secondary | ICD-10-CM | POA: Diagnosis not present

## 2020-10-21 DIAGNOSIS — N939 Abnormal uterine and vaginal bleeding, unspecified: Secondary | ICD-10-CM | POA: Diagnosis not present

## 2020-10-21 DIAGNOSIS — D252 Subserosal leiomyoma of uterus: Secondary | ICD-10-CM | POA: Diagnosis not present

## 2020-11-18 ENCOUNTER — Ambulatory Visit: Payer: Medicare Other | Admitting: Obstetrics & Gynecology

## 2020-12-31 ENCOUNTER — Telehealth: Payer: Self-pay

## 2020-12-31 NOTE — Telephone Encounter (Signed)
AWV - Scheduled for 9.2 at 10am (SD) 8.3

## 2021-01-05 ENCOUNTER — Ambulatory Visit (INDEPENDENT_AMBULATORY_CARE_PROVIDER_SITE_OTHER): Payer: Medicare Other | Admitting: Family Medicine

## 2021-01-05 ENCOUNTER — Other Ambulatory Visit: Payer: Self-pay

## 2021-01-05 ENCOUNTER — Encounter: Payer: Self-pay | Admitting: Family Medicine

## 2021-01-05 VITALS — BP 136/81 | HR 77 | Ht 63.0 in | Wt 192.2 lb

## 2021-01-05 DIAGNOSIS — D500 Iron deficiency anemia secondary to blood loss (chronic): Secondary | ICD-10-CM | POA: Diagnosis not present

## 2021-01-05 DIAGNOSIS — N939 Abnormal uterine and vaginal bleeding, unspecified: Secondary | ICD-10-CM

## 2021-01-05 MED ORDER — IBUPROFEN 400 MG PO TABS: 400.0000 mg | ORAL_TABLET | Freq: Four times a day (QID) | ORAL | 1 refills | Status: DC | PRN

## 2021-01-05 NOTE — Patient Instructions (Signed)
I recommend that you make a follow-up appointment with your gynecologist to discuss the concern for adeno myosis versus leiomyoma seen on the ultrasound imaging.  We can always have a further discussion about birth control which may help with your heavy menstrual bleeding as well as your headaches.  We are going to check a CBC today to look at your blood counts for your history of anemia.  I believe it would be okay to go ahead and start the ibuprofen to use to prevent your headaches.  I recommend that you take 1 of these in the morning on days that you are expecting to have your periods to help minimize the risk of a headache.  I do not recommend that she take this more than 5 days in a row without a break to help prevent against stomach ulcers or kidney stress.

## 2021-01-05 NOTE — Assessment & Plan Note (Signed)
Continues on iron supplementation but does not take it consistently.  We will check a CBC today.  Patient is planning to follow-up with gynecology regarding her ultrasound results suggesting adenomyosis versus leiomyoma on the anterior aspect of the uterus.  She does not have the IUD in place per her ultrasound and will consider restarting contraception as this may help with her headaches and abnormal uterine bleeding.

## 2021-01-05 NOTE — Progress Notes (Signed)
    SUBJECTIVE:   CHIEF COMPLAINT / HPI:   Headaches: Lately has been waking up with them. She has tried ibuprofen and it hasn't helped a lot. She has some associated dizziness with them. Most of these happen when she is on her menstrual  period. Happens almost every day when on her period and she often wakes up on them. Denies snoring or waking as if she has sleep apnea. Headaches seem to happen "all over" head at various times. Throbbing consistency. No photo or phonophobic. Was not having them prior to when she felt she lost her IUD.  Anemia: Has been feeling more tired lately and having headaches. Feels similar to last time blood counts were low. No blood in stool or black stools. Has ~7 days of heavy bleeding on periods. Uses tampon and pads and sometimes bleeds though these.  PERTINENT  PMH / PSH:   OBJECTIVE:   BP 136/81   Pulse 77   Ht '5\' 3"'$  (1.6 m)   Wt 192 lb 4 oz (87.2 kg)   SpO2 99%   BMI 34.06 kg/m    General: NAD, pleasant, able to participate in exam Cardiac: RRR, no murmurs. Respiratory: CTAB, normal effort, No wheezes, rales or rhonchi Abdomen: Bowel sounds present, nontender Skin: warm and dry, no rashes noted Neuro: alert, CN II through XII intact, strength 5/5 in upper and lower extremities bilaterally, fine touch sensation intact in upper and lower extremities bilaterally. Psych: Normal affect and mood  ASSESSMENT/PLAN:   Abnormal uterine bleeding (AUB) Continues on iron supplementation but does not take it consistently.  We will check a CBC today.  Patient is planning to follow-up with gynecology regarding her ultrasound results suggesting adenomyosis versus leiomyoma on the anterior aspect of the uterus.  She does not have the IUD in place per her ultrasound and will consider restarting contraception as this may help with her headaches and abnormal uterine bleeding.   Headaches: Normal neurologic exam.  Seem consistent with tension style headaches and are  likely related to her periods as it seems to be worse since losing her birth control and seems to happen only on mornings with her menstrual cycle.  Recommended starting ibuprofen 400 mg in the morning on days that she is expecting to have her periods.  She does not have any known history of GI bleed and denies melena or bright red blood per rectum, however she does have a history of anemia which has been from a menstrual source.  We will check CBC today as above.  Lurline Del, [place]

## 2021-01-06 LAB — CBC
Hematocrit: 27.9 % — ABNORMAL LOW (ref 34.0–46.6)
Hemoglobin: 8.4 g/dL — ABNORMAL LOW (ref 11.1–15.9)
MCH: 22.2 pg — ABNORMAL LOW (ref 26.6–33.0)
MCHC: 30.1 g/dL — ABNORMAL LOW (ref 31.5–35.7)
MCV: 74 fL — ABNORMAL LOW (ref 79–97)
Platelets: 405 10*3/uL (ref 150–450)
RBC: 3.79 x10E6/uL (ref 3.77–5.28)
RDW: 18.4 % — ABNORMAL HIGH (ref 11.7–15.4)
WBC: 4.6 10*3/uL (ref 3.4–10.8)

## 2021-01-13 ENCOUNTER — Ambulatory Visit (INDEPENDENT_AMBULATORY_CARE_PROVIDER_SITE_OTHER): Payer: Medicare Other | Admitting: Family Medicine

## 2021-01-13 ENCOUNTER — Other Ambulatory Visit: Payer: Self-pay

## 2021-01-13 ENCOUNTER — Other Ambulatory Visit (HOSPITAL_COMMUNITY)
Admission: RE | Admit: 2021-01-13 | Discharge: 2021-01-13 | Disposition: A | Discharge status: Home / Self Care | Payer: Medicare Other | Source: Ambulatory Visit | Attending: Family Medicine | Admitting: Family Medicine

## 2021-01-13 VITALS — BP 134/73 | HR 79 | Ht 63.0 in | Wt 190.4 lb

## 2021-01-13 DIAGNOSIS — Z113 Encounter for screening for infections with a predominantly sexual mode of transmission: Secondary | ICD-10-CM | POA: Insufficient documentation

## 2021-01-13 NOTE — Patient Instructions (Addendum)
It was great seeing you today.  We have collected samples and will send them off.  When the results come back I will let you know through your MyChart.  We did collect a wet prep which we will have those results today and I will either call you with those results or send you a MyChart message.  If you have any worsening symptoms please feel free to be reevaluated.  Follow-up with gynecology regarding your heavy menses.  I hope you have a wonderful day!

## 2021-01-13 NOTE — Progress Notes (Signed)
wet 

## 2021-01-13 NOTE — Progress Notes (Signed)
    SUBJECTIVE:   CHIEF COMPLAINT / HPI:   STD screening Patient reports that she is here for routine STD screening.  She is sexually active and does not always use protection.  She has history of a tubal.  LMP was at the beginning of this month.  Reports mild increased discharge as well as some right sided pelvic pain.  Denies dysuria or increased urination.  Denies any known contact with someone with an STD. OBJECTIVE:   BP 134/73   Pulse 79   Ht '5\' 3"'$  (1.6 m)   Wt 190 lb 6.4 oz (86.4 kg)   BMI 33.73 kg/m   General: Well-appearing 40 year old female, no acute distress Cardiac: Regular rate and rhythm, no murmurs appreciated Abdomen: Soft, nontender, positive bowel sounds, no costovertebral angle tenderness GU: Normal external female vaginal tissue, normal internal vaginal tissue with moderate to large amount of clear discharge from the cervix.  Cervix is nontender, nonfriable, nonerythematous.  ASSESSMENT/PLAN:   Screening examination for STD (sexually transmitted disease) Patient presents for STD screening.  Mild amount of abdominal pain and discharge.  No cervical motion tenderness or costovertebral angle tenderness.  Do not feel this is PID.  Collected gonorrhea/chlamydia as well as trichomonas, BV, yeast.  Will call patient with results when they come back if there are any abnormalities.  Strict return precautions given and patient is agreeable to this.     Gifford Shave, MD [place]

## 2021-01-14 DIAGNOSIS — Z113 Encounter for screening for infections with a predominantly sexual mode of transmission: Secondary | ICD-10-CM | POA: Insufficient documentation

## 2021-01-14 NOTE — Assessment & Plan Note (Signed)
Patient presents for STD screening.  Mild amount of abdominal pain and discharge.  No cervical motion tenderness or costovertebral angle tenderness.  Do not feel this is PID.  Collected gonorrhea/chlamydia as well as trichomonas, BV, yeast.  Will call patient with results when they come back if there are any abnormalities.  Strict return precautions given and patient is agreeable to this.

## 2021-01-15 ENCOUNTER — Telehealth: Payer: Self-pay | Admitting: Family Medicine

## 2021-01-15 DIAGNOSIS — Z20822 Contact with and (suspected) exposure to covid-19: Secondary | ICD-10-CM | POA: Diagnosis not present

## 2021-01-15 DIAGNOSIS — Z6828 Body mass index (BMI) 28.0-28.9, adult: Secondary | ICD-10-CM | POA: Diagnosis not present

## 2021-01-15 LAB — CERVICOVAGINAL ANCILLARY ONLY
Bacterial Vaginitis (gardnerella): POSITIVE — AB
Candida Glabrata: NEGATIVE
Candida Vaginitis: NEGATIVE
Chlamydia: NEGATIVE
Chlamydia: NEGATIVE
Comment: NEGATIVE
Comment: NEGATIVE
Comment: NEGATIVE
Comment: NEGATIVE
Comment: NEGATIVE
Comment: NEGATIVE
Comment: NORMAL
Comment: NORMAL
Neisseria Gonorrhea: NEGATIVE
Neisseria Gonorrhea: NEGATIVE
Trichomonas: NEGATIVE

## 2021-01-15 MED ORDER — METRONIDAZOLE 500 MG PO TABS: 500.0000 mg | ORAL_TABLET | Freq: Two times a day (BID) | ORAL | 0 refills | Status: DC

## 2021-01-15 NOTE — Telephone Encounter (Signed)
Called patient to inform her of positive BV results.  She reports that she is taking metronidazole before; a prescription for this. No further questions or concerns.

## 2021-01-27 NOTE — Patient Instructions (Addendum)
Health Maintenance, Female Adopting a healthy lifestyle and getting preventive care are important in promoting health and wellness. Ask your health care provider about: The right schedule for you to have regular tests and exams. Things you can do on your own to prevent diseases and keep yourself healthy. What should I know about diet, weight, and exercise? Eat a healthy diet  Eat a diet that includes plenty of vegetables, fruits, low-fat dairy products, and lean protein. Do not eat a lot of foods that are high in solid fats, added sugars, or sodium. Maintain a healthy weight Body mass index (BMI) is used to identify weight problems. It estimates body fat based on height and weight. Your health care provider can help determine your BMI and help you achieve or maintain a healthy weight. Get regular exercise Get regular exercise. This is one of the most important things you can do for your health. Most adults should: Exercise for at least 150 minutes each week. The exercise should increase your heart rate and make you sweat (moderate-intensity exercise). Do strengthening exercises at least twice a week. This is in addition to the moderate-intensity exercise. Spend less time sitting. Even light physical activity can be beneficial. Watch cholesterol and blood lipids Have your blood tested for lipids and cholesterol at 41 years of age, then have this test every 5 years. Have your cholesterol levels checked more often if: Your lipid or cholesterol levels are high. You are older than 40 years of age. You are at high risk for heart disease. What should I know about cancer screening? Depending on your health history and family history, you may need to have cancer screening at various ages. This may include screening for: Breast cancer. Cervical cancer. Colorectal cancer. Skin cancer. Lung cancer. What should I know about heart disease, diabetes, and high blood pressure? Blood pressure and heart  disease High blood pressure causes heart disease and increases the risk of stroke. This is more likely to develop in people who have high blood pressure readings, are of African descent, or are overweight. Have your blood pressure checked: Every 3-5 years if you are 18-39 years of age. Every year if you are 40 years old or older. Diabetes Have regular diabetes screenings. This checks your fasting blood sugar level. Have the screening done: Once every three years after age 40 if you are at a normal weight and have a low risk for diabetes. More often and at a younger age if you are overweight or have a high risk for diabetes. What should I know about preventing infection? Hepatitis B If you have a higher risk for hepatitis B, you should be screened for this virus. Talk with your health care provider to find out if you are at risk for hepatitis B infection. Hepatitis C Testing is recommended for: Everyone born from 1945 through 1965. Anyone with known risk factors for hepatitis C. Sexually transmitted infections (STIs) Get screened for STIs, including gonorrhea and chlamydia, if: You are sexually active and are younger than 41 years of age. You are older than 41 years of age and your health care provider tells you that you are at risk for this type of infection. Your sexual activity has changed since you were last screened, and you are at increased risk for chlamydia or gonorrhea. Ask your health care provider if you are at risk. Ask your health care provider about whether you are at high risk for HIV. Your health care provider may recommend a prescription medicine   to help prevent HIV infection. If you choose to take medicine to prevent HIV, you should first get tested for HIV. You should then be tested every 3 months for as long as you are taking the medicine. Pregnancy If you are about to stop having your period (premenopausal) and you may become pregnant, seek counseling before you get  pregnant. Take 400 to 800 micrograms (mcg) of folic acid every day if you become pregnant. Ask for birth control (contraception) if you want to prevent pregnancy. Osteoporosis and menopause Osteoporosis is a disease in which the bones lose minerals and strength with aging. This can result in bone fractures. If you are 65 years old or older, or if you are at risk for osteoporosis and fractures, ask your health care provider if you should: Be screened for bone loss. Take a calcium or vitamin D supplement to lower your risk of fractures. Be given hormone replacement therapy (HRT) to treat symptoms of menopause. Follow these instructions at home: Lifestyle Do not use any products that contain nicotine or tobacco, such as cigarettes, e-cigarettes, and chewing tobacco. If you need help quitting, ask your health care provider. Do not use street drugs. Do not share needles. Ask your health care provider for help if you need support or information about quitting drugs. Alcohol use Do not drink alcohol if: Your health care provider tells you not to drink. You are pregnant, may be pregnant, or are planning to become pregnant. If you drink alcohol: Limit how much you use to 0-1 drink a day. Limit intake if you are breastfeeding. Be aware of how much alcohol is in your drink. In the [place] one drink equals one 12 oz bottle of beer (355 mL), one 5 oz glass of wine (148 mL), or one 1 oz glass of hard liquor (44 mL). General instructions Schedule regular health, dental, and eye exams. Stay current with your vaccines. Tell your health care provider if: You often feel depressed. You have ever been abused or do not feel safe at home. Summary Adopting a healthy lifestyle and getting preventive care are important in promoting health and wellness. Follow your health care provider's instructions about healthy diet, exercising, and getting tested or screened for diseases. Follow your health care provider's  instructions on monitoring your cholesterol and blood pressure. This information is not intended to replace advice given to you by your health care provider. Make sure you discuss any questions you have with your health care provider. Document Revised: 07/25/2020 Document Reviewed: 05/10/2018 Elsevier Patient Education  2022 [employer]  
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
## 2021-01-30 ENCOUNTER — Ambulatory Visit (INDEPENDENT_AMBULATORY_CARE_PROVIDER_SITE_OTHER): Payer: Medicare Other

## 2021-01-30 DIAGNOSIS — Z0000 Encounter for general adult medical examination without abnormal findings: Secondary | ICD-10-CM

## 2021-01-30 DIAGNOSIS — Z5329 Procedure and treatment not carried out because of patient's decision for other reasons: Secondary | ICD-10-CM

## 2021-01-30 NOTE — Progress Notes (Signed)
1st Attempt - 9:59 am - LVM for pt to return call.  2nd Attempt - 10:07am - LVM for pt to return call.  3rd Attempt - 10:20 am - LVM for pt to return call.

## 2021-02-18 ENCOUNTER — Telehealth: Payer: Self-pay | Admitting: Family Medicine

## 2021-02-18 NOTE — Telephone Encounter (Signed)
Patient is dropping off Certification of disability form to be completed. LDOS 01-13-21.  Patient would like for someone to call her when form is completed and ready to be picked up at the front desk.   I have placed form in blue team folder.

## 2021-02-20 NOTE — Telephone Encounter (Signed)
Alexis - I did not see the form in my box? Could you have put in someone' elses box accidentally? Sherren Mocha

## 2021-02-25 ENCOUNTER — Encounter: Payer: Self-pay | Admitting: Family Medicine

## 2021-02-25 ENCOUNTER — Telehealth: Payer: Self-pay | Admitting: Family Medicine

## 2021-02-25 DIAGNOSIS — Z560 Unemployment, unspecified: Secondary | ICD-10-CM

## 2021-02-25 HISTORY — DX: Unemployment, unspecified: Z56.0

## 2021-02-25 NOTE — Telephone Encounter (Signed)
I spoke with Ms Lona about the Certification for Disability for property tax exclusion.  Ms Leason reports that she has been designated as totally and permanently disabled by DSS for about the last 6 years.   State of [place] for property tax exclusion signed. Form placed in RN box

## 2021-02-26 NOTE — Telephone Encounter (Signed)
Form placed up front for pick up and a copy was made for batch scanning.   Attempted to call patient to inform, however I had to LVM.

## 2021-03-09 ENCOUNTER — Other Ambulatory Visit (HOSPITAL_COMMUNITY)
Admission: RE | Admit: 2021-03-09 | Discharge: 2021-03-09 | Disposition: A | Discharge status: Home / Self Care | Payer: Medicare Other | Source: Ambulatory Visit | Attending: Obstetrics & Gynecology | Admitting: Obstetrics & Gynecology

## 2021-03-09 ENCOUNTER — Ambulatory Visit (INDEPENDENT_AMBULATORY_CARE_PROVIDER_SITE_OTHER): Payer: Medicare Other | Admitting: Obstetrics and Gynecology

## 2021-03-09 ENCOUNTER — Other Ambulatory Visit: Payer: Self-pay

## 2021-03-09 ENCOUNTER — Encounter: Payer: Self-pay | Admitting: Obstetrics and Gynecology

## 2021-03-09 VITALS — BP 129/77 | HR 75 | Wt 197.1 lb

## 2021-03-09 DIAGNOSIS — T8332XA Displacement of intrauterine contraceptive device, initial encounter: Secondary | ICD-10-CM

## 2021-03-09 DIAGNOSIS — Z114 Encounter for screening for human immunodeficiency virus [HIV]: Secondary | ICD-10-CM | POA: Diagnosis not present

## 2021-03-09 DIAGNOSIS — Z113 Encounter for screening for infections with a predominantly sexual mode of transmission: Secondary | ICD-10-CM | POA: Insufficient documentation

## 2021-03-09 DIAGNOSIS — N939 Abnormal uterine and vaginal bleeding, unspecified: Secondary | ICD-10-CM

## 2021-03-09 DIAGNOSIS — T8332XD Displacement of intrauterine contraceptive device, subsequent encounter: Secondary | ICD-10-CM

## 2021-03-09 MED ORDER — TRANEXAMIC ACID 650 MG PO TABS: 1300.0000 mg | ORAL_TABLET | Freq: Three times a day (TID) | ORAL | 2 refills | Status: DC

## 2021-03-09 NOTE — Progress Notes (Signed)
  CC: gyn follow up Subjective:    Patient ID: Victoria Rodriguez, female    DOB: Jun 26, 1980, 40 y.o.   MRN: [id number]  HPI Pt seen for gyn follow up.  Pt last seen 09/2020 for evaluation of IUD placement.  Strings were not seen and pelvic u/s was ordered which showed no IUD in place. Pt did not return for follow up Pt continues to have 7 days menses which are heavy the first 5 days.  Discussed treatment options including lysteda and uterine ablation.    Review of Systems     Objective:   Physical Exam Vitals:   03/09/21 0954  BP: 129/77  Pulse: 75         Assessment & Plan:   1. Abnormal uterine bleeding (AUB) 3 month trial of oral TXA to see if it helps bleeding become more manageable If ineffective consider uterine ablation versus hysterectomy - tranexamic acid (LYSTEDA) 650 MG TABS tablet; Take 2 tablets (1,300 mg total) by mouth 3 (three) times daily. Take during menses for a maximum of five days  Dispense: 30 tablet; Refill: 2  2. Screening examination for STD (sexually transmitted disease)  - Cervicovaginal ancillary only( [facility]) - Hepatitis B Surface AntiGEN - Hepatitis C Antibody - HIV Antibody (routine testing w rflx) - RPR  3. Intrauterine contraceptive device threads lost, initial encounter Will order abdominal scan.  If IUD not seen will presume it has been expelled - DG Abd 1 View; Future  4. Encounter for screening for human immunodeficiency virus (HIV)  Added per medicare diagnosis for order - HIV Antibody (routine testing w rflx)  5. Intrauterine contraceptive device threads lost, subsequent encounter  I spent 20 minutes dedicated to the care of this patient including previsit review of records, face to face time with the patient discussing treatment options and post visit testing.   Virtual visit in 3 months Griffin Basil, MD Faculty Attending, [facility] [employer]

## 2021-03-10 LAB — HEPATITIS C ANTIBODY: Hep C Virus Ab: <0.1 s/co ratio (ref 0.0–0.9)

## 2021-03-10 LAB — CERVICOVAGINAL ANCILLARY ONLY
Bacterial Vaginitis (gardnerella): POSITIVE — AB
Candida Glabrata: NEGATIVE
Candida Vaginitis: NEGATIVE
Chlamydia: NEGATIVE
Comment: NEGATIVE
Comment: NEGATIVE
Comment: NEGATIVE
Comment: NEGATIVE
Comment: NEGATIVE
Comment: NORMAL
Neisseria Gonorrhea: NEGATIVE
Trichomonas: NEGATIVE

## 2021-03-10 LAB — HIV ANTIBODY (ROUTINE TESTING W REFLEX): HIV Screen 4th Generation wRfx: NONREACTIVE

## 2021-03-10 LAB — RPR: RPR Ser Ql: NONREACTIVE

## 2021-03-10 LAB — HEPATITIS B SURFACE ANTIGEN: Hepatitis B Surface Ag: NEGATIVE

## 2021-03-11 ENCOUNTER — Telehealth: Payer: Self-pay | Admitting: Lactation Services

## 2021-03-11 NOTE — Telephone Encounter (Signed)
Called patient to inform her of + BV on recent swab and offer treatment. Was not able to reach patient. LM For patient to call the office at [phone] for results and to also check her My Chart message.

## 2021-03-11 NOTE — Telephone Encounter (Signed)
-----   Message from Griffin Basil, MD sent at 03/11/2021  8:28 AM EDT ----- STD labs negative, BV positive will offer treatment

## 2021-03-22 ENCOUNTER — Other Ambulatory Visit: Payer: Self-pay | Admitting: Family Medicine

## 2021-04-27 ENCOUNTER — Encounter: Payer: Self-pay | Admitting: Obstetrics and Gynecology

## 2021-04-28 MED ORDER — METRONIDAZOLE 500 MG PO TABS: 500.0000 mg | ORAL_TABLET | Freq: Two times a day (BID) | ORAL | 0 refills | Status: DC

## 2021-05-15 ENCOUNTER — Other Ambulatory Visit: Payer: Self-pay | Admitting: Family Medicine

## 2021-05-15 MED ORDER — IBUPROFEN 600 MG PO TABS: 600.0000 mg | ORAL_TABLET | Freq: Three times a day (TID) | ORAL | 2 refills | Status: DC | PRN

## 2021-05-15 MED ORDER — ACETAMINOPHEN ER 650 MG PO TBCR: 1300.0000 mg | EXTENDED_RELEASE_TABLET | Freq: Three times a day (TID) | ORAL | 0 refills | Status: DC | PRN

## 2021-05-27 ENCOUNTER — Other Ambulatory Visit: Payer: Self-pay

## 2021-05-27 ENCOUNTER — Ambulatory Visit (HOSPITAL_COMMUNITY)
Admission: RE | Admit: 2021-05-27 | Discharge: 2021-05-27 | Disposition: A | Discharge status: Home / Self Care | Payer: Medicare Other | Source: Ambulatory Visit | Attending: Obstetrics and Gynecology | Admitting: Obstetrics and Gynecology

## 2021-05-27 DIAGNOSIS — T8332XA Displacement of intrauterine contraceptive device, initial encounter: Secondary | ICD-10-CM | POA: Diagnosis not present

## 2021-06-30 ENCOUNTER — Ambulatory Visit (INDEPENDENT_AMBULATORY_CARE_PROVIDER_SITE_OTHER): Payer: Medicare Other | Admitting: Family Medicine

## 2021-06-30 ENCOUNTER — Other Ambulatory Visit: Payer: Self-pay

## 2021-06-30 VITALS — BP 126/79 | HR 79 | Ht 63.0 in | Wt 203.2 lb

## 2021-06-30 DIAGNOSIS — D509 Iron deficiency anemia, unspecified: Secondary | ICD-10-CM | POA: Diagnosis not present

## 2021-06-30 DIAGNOSIS — Z23 Encounter for immunization: Secondary | ICD-10-CM

## 2021-06-30 DIAGNOSIS — R21 Rash and other nonspecific skin eruption: Secondary | ICD-10-CM | POA: Diagnosis not present

## 2021-06-30 MED ORDER — TRIAMCINOLONE ACETONIDE 0.1 % EX OINT: 1.0000 "application " | TOPICAL_OINTMENT | Freq: Two times a day (BID) | CUTANEOUS | 1 refills | Status: DC

## 2021-06-30 NOTE — Progress Notes (Signed)
° ° °  SUBJECTIVE:   CHIEF COMPLAINT / HPI:   Patient presents with sores all over had body for the past month. Denies any changes and have not grown. Denies use of any new products including body wash and laundry detergent. She first noticed it on her arm and then her abdomen. Denies any recent illness, fever, chills or appetite changes. Denies itching, burning sensation or other associated symptoms.   Patient also has a history of iron deficiency anemia, endorsing intermittent fatigue. She was also on iron supplementation but recently stopped and started back again. Currently compliant on iron supplementation every other day.   OBJECTIVE:   BP 126/79    Pulse 79    Ht 5\' 3"  (1.6 m)    Wt 203 lb 4 oz (92.2 kg)    LMP 06/10/2021    SpO2 100%    BMI 36.00 kg/m   General: Patient well-appearing, in no acute distress. CV: RRR, no murmurs or gallops auscultated Resp: CTAB Derm: 0.5 cm hyperpigmented area without umbilication or drainage noted along left arm and abdomen without surrounding erythema   ASSESSMENT/PLAN:   Rash -patient has history of hidradenitis suppurativa but rash does not seem consistent with this, eczema also on the differential. Possibly could be a manifestation of iron deficiency anemia. Reassuringly seem to be healing. -trial of triamcinolone prescribed with instructions to be used on new lesions only   Anemia, iron deficiency -consider obtaining repeat CBC at next visit  -likely secondary to iron deficiency anemia with history of abnormal uterine bleeding but may consider early colonoscopy if repeat CBC demonstrates worsened anemia  -continue iron supplementation  -follow up with PCP      Donney Dice, [place]

## 2021-06-30 NOTE — Assessment & Plan Note (Signed)
-  patient has history of hidradenitis suppurativa but rash does not seem consistent with this, eczema also on the differential. Possibly could be a manifestation of iron deficiency anemia. Reassuringly seem to be healing. -trial of triamcinolone prescribed with instructions to be used on new lesions only

## 2021-06-30 NOTE — Patient Instructions (Addendum)
It was great seeing you today!  It seems like your sores are healing, I can give you triamcinolone cream for the new bumps that arise.   Please continue to take your iron supplement every other day.   Please follow up at your next scheduled appointment with your primary care doctor, if anything arises between now and then, please don't hesitate to contact our office.   Thank you for allowing [place] to be a part of your medical care!  Thank you, Dr. Larae Grooms

## 2021-06-30 NOTE — Assessment & Plan Note (Signed)
-  consider obtaining repeat CBC at next visit  -likely secondary to iron deficiency anemia with history of abnormal uterine bleeding but may consider early colonoscopy if repeat CBC demonstrates worsened anemia  -continue iron supplementation  -follow up with PCP

## 2021-07-09 ENCOUNTER — Encounter: Payer: Self-pay | Admitting: Family Medicine

## 2021-07-09 DIAGNOSIS — N946 Dysmenorrhea, unspecified: Secondary | ICD-10-CM

## 2021-07-10 MED ORDER — IBUPROFEN 600 MG PO TABS: 600.0000 mg | ORAL_TABLET | Freq: Three times a day (TID) | ORAL | 2 refills | Status: DC | PRN

## 2021-07-21 ENCOUNTER — Other Ambulatory Visit: Payer: Self-pay | Admitting: Family Medicine

## 2021-07-21 ENCOUNTER — Other Ambulatory Visit: Payer: Self-pay

## 2021-07-21 ENCOUNTER — Encounter: Payer: Self-pay | Admitting: Family Medicine

## 2021-07-21 ENCOUNTER — Ambulatory Visit (INDEPENDENT_AMBULATORY_CARE_PROVIDER_SITE_OTHER): Payer: Medicare Other | Admitting: Family Medicine

## 2021-07-21 VITALS — BP 110/68 | HR 81 | Ht 63.0 in | Wt 199.2 lb

## 2021-07-21 DIAGNOSIS — R928 Other abnormal and inconclusive findings on diagnostic imaging of breast: Secondary | ICD-10-CM | POA: Diagnosis not present

## 2021-07-21 DIAGNOSIS — N939 Abnormal uterine and vaginal bleeding, unspecified: Secondary | ICD-10-CM | POA: Diagnosis not present

## 2021-07-21 DIAGNOSIS — Z1231 Encounter for screening mammogram for malignant neoplasm of breast: Secondary | ICD-10-CM

## 2021-07-21 DIAGNOSIS — L0292 Furuncle, unspecified: Secondary | ICD-10-CM | POA: Diagnosis not present

## 2021-07-21 DIAGNOSIS — L0293 Carbuncle, unspecified: Secondary | ICD-10-CM

## 2021-07-21 DIAGNOSIS — L659 Nonscarring hair loss, unspecified: Secondary | ICD-10-CM

## 2021-07-21 DIAGNOSIS — D500 Iron deficiency anemia secondary to blood loss (chronic): Secondary | ICD-10-CM | POA: Diagnosis not present

## 2021-07-21 MED ORDER — MUPIROCIN CALCIUM 2 % EX CREA: 1.0000 "application " | TOPICAL_CREAM | Freq: Two times a day (BID) | CUTANEOUS | 0 refills | Status: DC

## 2021-07-21 NOTE — Progress Notes (Signed)
Victoria Rodriguez is alone Sources of clinical information for visit is/are patient. Nursing assessment for this office visit was reviewed with the patient for accuracy and revision.     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 0 0 No No No  Number falls in past yr: 0 - - - -  Follow up Falls evaluation completed - - - -    PHQ9 SCORE ONLY 07/21/2021 06/30/2021 03/09/2021  PHQ-9 Total Score 1 1 0    Adult vaccines due  Topic Date Due   TETANUS/TDAP  12/08/2025    There are no preventive care reminders to display for this patient.    History/P.E. limitations: none  Adult vaccines due  Topic Date Due   TETANUS/TDAP  12/08/2025   There are no preventive care reminders to display for this patient. There are no preventive care reminders to display for this patient.   Chief Complaint  Patient presents with   Annual Exam    CPT E&M Office Visit Time Before Visit; reviewing medical records (e.g. recent visits, labs, studies): 5 minutes During Visit (F2F time): 20 minutes After Visit (discussion with family or HCP,  prescribing, ordering, referring, calling result/recommendations or documenting on same day): 10 minutes Total Visit Time: 45 minutes  Level 2 Est: 10-19 min     New: 15-29 min Level 3 Est: 20-29 min     New: 30-44 min Level 4 Est: 30-39 min     New: 45-59 min Level 5 Est: 40-54 min     New: 60-74 min   > Level 5 - see prolonged service CPT E&M codes; 99XXX

## 2021-07-21 NOTE — Patient Instructions (Addendum)
It was good to see you again Victoria Rodriguez.  We are checking your hemoglobin and iron today.   Keep taking as much of the iron as you can tolerate.  We are checking your thyroid today to see if it could be causing your hair loss.  I believe the sores on your bottom and abdomin are boils caused by the germ Staph. Whenever the boils start, rub the sore with the antibiotic cream, mupirocin, two to three times a day.   This will make the boils dry up quicker.   A referral back to see Dr Lynnda Shields, gynecologist, about your heavy menstrual bleeding was made.  You may let him know your preference for hysterectomy.   A referral was sent for your screening mammogram.   If you have not heard from your referrals within the next 5 business days, please let my office know.

## 2021-07-22 ENCOUNTER — Telehealth: Payer: Self-pay | Admitting: Family Medicine

## 2021-07-22 DIAGNOSIS — L0292 Furuncle, unspecified: Secondary | ICD-10-CM | POA: Insufficient documentation

## 2021-07-22 DIAGNOSIS — L659 Nonscarring hair loss, unspecified: Secondary | ICD-10-CM | POA: Insufficient documentation

## 2021-07-22 LAB — CBC
Hematocrit: 30.1 % — ABNORMAL LOW (ref 34.0–46.6)
Hemoglobin: 8.4 g/dL — ABNORMAL LOW (ref 11.1–15.9)
MCH: 19.6 pg — ABNORMAL LOW (ref 26.6–33.0)
MCHC: 27.9 g/dL — ABNORMAL LOW (ref 31.5–35.7)
MCV: 70 fL — ABNORMAL LOW (ref 79–97)
Platelets: 430 10*3/uL (ref 150–450)
RBC: 4.28 x10E6/uL (ref 3.77–5.28)
RDW: 24.1 % — ABNORMAL HIGH (ref 11.7–15.4)
WBC: 4 10*3/uL (ref 3.4–10.8)

## 2021-07-22 LAB — TSH: TSH: 1.61 u[IU]/mL (ref 0.450–4.500)

## 2021-07-22 LAB — FERRITIN: Ferritin: 5 ng/mL — ABNORMAL LOW (ref 15–150)

## 2021-07-22 NOTE — Assessment & Plan Note (Signed)
Established problem worsened.  Ms Witte has had trouble tolerating oral iron because of constipation.  She has not taken iron in over two weeks.  She has a history of oral iron intolerance requiring IV iron infusions CBC:    Component Value Date/Time   WBC 4.0 07/21/2021 1041   WBC 5.6 05/07/2019 1549   HGB 8.4 (L) 07/21/2021 1041   HGB 10.5 06/12/2015 0000   HCT 30.1 (L) 07/21/2021 1041   HCT 34 06/12/2015 0000   PLT 430 07/21/2021 1041   PLT 446 06/12/2015 0000   MCV 70 (L) 07/21/2021 1041   NEUTROABS 2.2 02/20/2020 1201   LYMPHSABS 1.8 02/20/2020 1201   MONOABS 0.5 05/23/2015 1723   EOSABS 0.1 02/20/2020 1201   BASOSABS 0.0 02/20/2020 1201   Lab Results  Component Value Date   FERRITIN 5 (L) 07/21/2021   A/ Iron deficiency Anemia - Secondary to Heavy Menstrual Bleeding Oral iron supplement intolerance  P/ Arrange Venofer 500 mg IV infusion at short-stay hospital Referral to Hematology to provide better monitoring and management of this chronic issue. Referral to GYN for management of Heavy Menstrual Bleeding.

## 2021-07-22 NOTE — Assessment & Plan Note (Signed)
Established problem that has improved.  Multiple healed skin sites on buttocks and anterior lower abdomin No currently active infectious sites.  P/ Mupirocin cream three times a day to boils as they arise.

## 2021-07-22 NOTE — Telephone Encounter (Signed)
LM for patient to call back.  She has been scheduled for her iron infusion on March 16 at 8am at [facility].  She can enter through [employer]	[place] and register in admitting and they will walk her over to the right department.  Yancy Hascall,CMA

## 2021-07-22 NOTE — Assessment & Plan Note (Signed)
Established problem, uncontrolled Continues to have menstrual bleeds last ~ 7 days with several days  Interfering with activities Onset about 3 to 4 years ago, after birth of her last child. Found to have possible uterine adenomyosis on pelvic MRI 2022.  Patient has required two IV iron transfusions for her IDA. She is intolerant of iron supplements, developing severe constipation despite stool softners and stimulants.   Trials of OCPs, NSAIDs, IUD, pulse doses of Provera, and most recently Tranexamic acid by Dr [name] (GYN) 02/2021.  No success with these therapies.  Patient is interested in hysterectomy.  She does not desire having any more children.   Plan Referral to Dr [name] to discuss other treatment options for her HMB.

## 2021-07-22 NOTE — Assessment & Plan Note (Signed)
New complaint Several years of gradual scalp hair loss, primarily on top of head and not on sides.  She believes there have been some sores on her scalp, but currently has a weave covering up her scalp.  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

## 2021-07-23 ENCOUNTER — Ambulatory Visit
Admission: RE | Admit: 2021-07-23 | Discharge: 2021-07-23 | Disposition: A | Discharge status: Home / Self Care | Payer: Medicare Other | Source: Ambulatory Visit | Attending: Family Medicine | Admitting: Family Medicine

## 2021-07-23 ENCOUNTER — Other Ambulatory Visit: Payer: Self-pay

## 2021-07-23 DIAGNOSIS — Z1231 Encounter for screening mammogram for malignant neoplasm of breast: Secondary | ICD-10-CM | POA: Diagnosis not present

## 2021-07-24 NOTE — Telephone Encounter (Signed)
Reviewed and agree.

## 2021-07-27 ENCOUNTER — Other Ambulatory Visit: Payer: Self-pay | Admitting: Family Medicine

## 2021-07-27 ENCOUNTER — Encounter: Payer: Self-pay | Admitting: Family Medicine

## 2021-07-27 ENCOUNTER — Other Ambulatory Visit: Payer: Self-pay

## 2021-07-27 ENCOUNTER — Ambulatory Visit
Admission: RE | Admit: 2021-07-27 | Discharge: 2021-07-27 | Disposition: A | Discharge status: Home / Self Care | Payer: Medicare Other | Source: Ambulatory Visit | Attending: Family Medicine | Admitting: Family Medicine

## 2021-07-27 ENCOUNTER — Telehealth: Payer: Self-pay

## 2021-07-27 DIAGNOSIS — R928 Other abnormal and inconclusive findings on diagnostic imaging of breast: Secondary | ICD-10-CM

## 2021-07-27 DIAGNOSIS — R922 Inconclusive mammogram: Secondary | ICD-10-CM | POA: Diagnosis not present

## 2021-07-27 NOTE — Telephone Encounter (Signed)
Received phone call from [facility] regarding appointment for today. They placed orders for mammogram and ultrasound, however, they need order cosigned prior to patient being able to have appointment.   Text paged provider at [phone].  Talbot Grumbling, RN

## 2021-08-12 ENCOUNTER — Other Ambulatory Visit (HOSPITAL_COMMUNITY): Payer: Self-pay | Admitting: *Deleted

## 2021-08-13 ENCOUNTER — Encounter (HOSPITAL_COMMUNITY)
Admission: RE | Admit: 2021-08-13 | Discharge: 2021-08-13 | Disposition: A | Discharge status: Home / Self Care | Payer: Medicare Other | Source: Ambulatory Visit | Attending: Family Medicine | Admitting: Family Medicine

## 2021-08-13 ENCOUNTER — Other Ambulatory Visit: Payer: Self-pay

## 2021-08-13 DIAGNOSIS — D509 Iron deficiency anemia, unspecified: Secondary | ICD-10-CM | POA: Insufficient documentation

## 2021-08-13 MED ORDER — SODIUM CHLORIDE 0.9 % IV SOLN
500.0000 mg | Freq: Once | INTRAVENOUS | Status: AC
  Administered 2021-08-13: 500 mg via INTRAVENOUS
  Filled 2021-08-13: qty 25

## 2021-09-04 ENCOUNTER — Encounter: Payer: Self-pay | Admitting: Family Medicine

## 2021-09-04 DIAGNOSIS — N946 Dysmenorrhea, unspecified: Secondary | ICD-10-CM

## 2021-09-07 MED ORDER — IBUPROFEN 600 MG PO TABS: 600.0000 mg | ORAL_TABLET | Freq: Three times a day (TID) | ORAL | 2 refills | Status: DC | PRN

## 2021-10-20 ENCOUNTER — Encounter: Payer: Self-pay | Admitting: Obstetrics and Gynecology

## 2021-10-20 ENCOUNTER — Ambulatory Visit (INDEPENDENT_AMBULATORY_CARE_PROVIDER_SITE_OTHER): Payer: Medicare Other | Admitting: Obstetrics and Gynecology

## 2021-10-20 VITALS — BP 129/71 | HR 78 | Ht 63.0 in | Wt 197.1 lb

## 2021-10-20 DIAGNOSIS — N939 Abnormal uterine and vaginal bleeding, unspecified: Secondary | ICD-10-CM

## 2021-10-20 NOTE — Progress Notes (Signed)
  CC; irregular bleeding Subjective:    Patient ID: Victoria Rodriguez, female    DOB: May 16, 1981, 41 y.o.   MRN: [id number]  HPI Pt seen for follow up of TXA treatment.  Pt still notes menses lasting 6-7 days with heavier bleeding for 4-5 days.  Pt notes no improvement with TXA.  Discussed alternative treatments including Novasure or hysterectomy.  Pt will review and discuss with family.  Info  and pamphlets given.  Pt will need current endometrial biopsy prior to either procedure.  She had fairly recent ultrasound 1 year ago.  Questionable adenomyosis but no fibroids noted.   Review of Systems     Objective:   Physical Exam Vitals:   10/20/21 1419  BP: 129/71  Pulse: 78         Assessment & Plan:   1. Abnormal uterine bleeding (AUB) Endometrial biopsy in 2-3 weeks Discuss treatment option at that time.  I spent 10 minutes dedicated to the care of this patient including previsit review of records, face to face time with the patient discussing treatment options and post visit testing.     Griffin Basil, MD Faculty Attending, Center for [facility]

## 2021-10-20 NOTE — Progress Notes (Signed)
Pt states has been hvng this bleeding for some time now, IUD didn't help & the TXA didn't help.Pt states still having real heavy bleeding during cycles.

## 2021-10-20 NOTE — Patient Instructions (Signed)
novasure

## 2021-10-27 ENCOUNTER — Ambulatory Visit (INDEPENDENT_AMBULATORY_CARE_PROVIDER_SITE_OTHER): Payer: Medicare Other | Admitting: Family Medicine

## 2021-10-27 ENCOUNTER — Other Ambulatory Visit (HOSPITAL_COMMUNITY)
Admission: RE | Admit: 2021-10-27 | Discharge: 2021-10-27 | Disposition: A | Discharge status: Home / Self Care | Payer: Medicare Other | Source: Ambulatory Visit | Attending: Family Medicine | Admitting: Family Medicine

## 2021-10-27 ENCOUNTER — Other Ambulatory Visit: Payer: Self-pay

## 2021-10-27 VITALS — BP 125/86 | HR 76 | Wt 198.6 lb

## 2021-10-27 DIAGNOSIS — L732 Hidradenitis suppurativa: Secondary | ICD-10-CM | POA: Diagnosis not present

## 2021-10-27 DIAGNOSIS — Z113 Encounter for screening for infections with a predominantly sexual mode of transmission: Secondary | ICD-10-CM | POA: Insufficient documentation

## 2021-10-27 DIAGNOSIS — N760 Acute vaginitis: Secondary | ICD-10-CM | POA: Diagnosis not present

## 2021-10-27 DIAGNOSIS — Z114 Encounter for screening for human immunodeficiency virus [HIV]: Secondary | ICD-10-CM

## 2021-10-27 DIAGNOSIS — B9689 Other specified bacterial agents as the cause of diseases classified elsewhere: Secondary | ICD-10-CM | POA: Diagnosis not present

## 2021-10-27 DIAGNOSIS — N940 Mittelschmerz: Secondary | ICD-10-CM | POA: Diagnosis not present

## 2021-10-27 LAB — POCT WET PREP (WET MOUNT)
Clue Cells Wet Prep Whiff POC: POSITIVE
Trichomonas Wet Prep HPF POC: ABSENT
WBC, Wet Prep HPF POC: NONE SEEN

## 2021-10-27 MED ORDER — DOXYCYCLINE HYCLATE 100 MG PO TABS: 100.0000 mg | ORAL_TABLET | Freq: Two times a day (BID) | ORAL | 0 refills | Status: AC

## 2021-10-27 MED ORDER — METRONIDAZOLE 500 MG PO TABS: 500.0000 mg | ORAL_TABLET | Freq: Two times a day (BID) | ORAL | 0 refills | Status: AC

## 2021-10-27 NOTE — Progress Notes (Cosign Needed Addendum)
    SUBJECTIVE:   CHIEF COMPLAINT / HPI:   STD testing States that she intermittently uses condoms.  Denies vaginal discharge or bleeding.  Having right-sided pelvic pain that is intermittent.  She endorses it happens around the time of her menstrual cycle.  This is not a new issue.  She does not currently have pain.  History of tubal ligation for contraception.  Up-to-date on Pap.   Treatment for Hidradenitis flare Now experiencing at bedtime flare in left axilla in buttocks area.  Is mildly painful without drainage.  History of surgical management for at bedtime.  Previously treated with doxycycline with reported improvement of symptoms.  PERTINENT  PMH / PSH: As above.   OBJECTIVE:   BP 125/86   Pulse 76   Wt 198 lb 9.6 oz (90.1 kg)   LMP 10/03/2021 (Approximate)   SpO2 100%   BMI 35.18 kg/m  Results for orders placed or performed in visit on 10/27/21 (from the past 24 hour(s))  POCT Wet Prep [facility] [place])     Status: Abnormal   Collection Time: 10/27/21  9:35 AM  Result Value Ref Range   Source Wet Prep POC VAG    WBC, Wet Prep HPF POC NONE SEEN    Bacteria Wet Prep HPF POC Moderate (A) Few   Clue Cells Wet Prep HPF POC Moderate (A) None   Clue Cells Wet Prep Whiff POC Positive Whiff    Yeast Wet Prep HPF POC None None   KOH Wet Prep POC None None   Trichomonas Wet Prep HPF POC Absent Absent  General: Appears well, no acute distress. Age appropriate. Respiratory: normal effort Pelvic exam: HS stage I on right buttock at vaginal opening, vulva, vagina, cervix, uterus and adnexa. Skin: Warm and dry, left axilla with stage I HS lesion Neuro: alert and oriented Psych: normal affect   ASSESSMENT/PLAN:   1. Screening examination for STD (sexually transmitted disease) - RPR - Cervicovaginal ancillary only - POCT Wet Prep [facility]) - Cervicovaginal ancillary only  2. Encounter for screening for human immunodeficiency virus (HIV) - HIV antibody (with reflex)  3.  Hidradenitis - doxycycline (VIBRA-TABS) 100 MG tablet; Take 1 tablet (100 mg total) by mouth 2 (two) times daily for 14 days.  Dispense: 28 tablet; Refill: 0  4. Mittelschmerz Several months of intermittent right sided pelvic pain surrounding her menstrual cycle. Return precautions for ectopic pregnancy discussed. Reasonably sure she is not pregnant given LMP and tubal ligation.   5. Bacterial vaginosis Notified via phone shortly following OV.  - metroNIDAZOLE (FLAGYL) 500 MG tablet; Take 1 tablet (500 mg total) by mouth 2 (two) times daily for 7 days.  Dispense: 14 tablet; Refill: 0   Gerlene Fee, [place]

## 2021-10-27 NOTE — Patient Instructions (Addendum)
It was wonderful to see you today.  Please bring ALL of your medications with you to every visit.   Today we talked about:  -I will notify you when labs are available - I have sent an antibiotic treatment for hidradenitis  Mittelschmerz Mittelschmerz is pain in the belly (abdomen) that happens between menstrual periods. The pain: Affects one side of the lower belly. The side may change from month to month. May be mild or very bad. May last from minutes to hours. May happen along with a small amount of bleeding from your vagina. Mittelschmerz is caused by the growth and release of an egg from an ovary. It is a natural part of the ovulation cycle. It often happens about 2 weeks after a period ends. Follow these instructions at home: Watch for any changes in your condition. Let your doctor know about them. Managing pain Take these actions to help with the pain: Try soaking in a hot bath. Try a heating pad to relax the muscles in the belly. If told, put heat on the painful area. Do this as often as told by your doctor. Use the heat source that your doctor recommends, such as a moist heat pack or a heating pad. Place a towel between your skin and the heat source. Leave the heat on for 20-30 minutes. Take off the heat if your skin turns bright red. This is very important. If you cannot feel pain, heat, or cold, you have a greater risk of getting burned.  General instructions Take over-the-counter and prescription medicines only as told by your doctor. Keep all follow-up visits. Contact a doctor if: The pain is very bad most months. The pain lasts longer than 24 hours, or your pain medicine is not helping. You have a fever. You keep feeling like you may vomit (nauseous). You keep vomiting. You miss your period. You have more than a small amount of blood coming from your vagina between periods. Summary Mittelschmerz is pain in the belly (abdomen) that happens between periods. This  condition is caused by the growth and release of an egg from an ovary. The pain may affect one side of the belly and may be mild or very bad. To help with your pain, soak in a hot bath or use a heating pad. Watch for any changes in your condition. Know when to contact a doctor for help. This information is not intended to replace advice given to you by your health care provider. Make sure you discuss any questions you have with your health care provider. Document Revised: 09/26/2020 Document Reviewed: 09/26/2020 Elsevier Patient Education  [place].   Please call the clinic at [phone] if your symptoms worsen or you have any concerns. It was our pleasure to serve you.  Dr. Janus Molder

## 2021-10-28 LAB — HIV ANTIBODY (ROUTINE TESTING W REFLEX): HIV Screen 4th Generation wRfx: NONREACTIVE

## 2021-10-28 LAB — CERVICOVAGINAL ANCILLARY ONLY
Chlamydia: NEGATIVE
Chlamydia: NEGATIVE
Comment: NEGATIVE
Comment: NEGATIVE
Comment: NORMAL
Comment: NORMAL
Neisseria Gonorrhea: NEGATIVE
Neisseria Gonorrhea: NEGATIVE

## 2021-10-28 LAB — RPR: RPR Ser Ql: NONREACTIVE

## 2021-11-03 ENCOUNTER — Encounter: Payer: Self-pay | Admitting: *Deleted

## 2021-12-04 ENCOUNTER — Encounter: Payer: Self-pay | Admitting: Family Medicine

## 2021-12-04 ENCOUNTER — Ambulatory Visit (INDEPENDENT_AMBULATORY_CARE_PROVIDER_SITE_OTHER): Payer: Medicare Other | Admitting: Family Medicine

## 2021-12-04 VITALS — BP 130/82 | HR 72 | Ht 63.0 in | Wt 198.0 lb

## 2021-12-04 DIAGNOSIS — D509 Iron deficiency anemia, unspecified: Secondary | ICD-10-CM

## 2021-12-04 DIAGNOSIS — E611 Iron deficiency: Secondary | ICD-10-CM | POA: Diagnosis not present

## 2021-12-04 DIAGNOSIS — N939 Abnormal uterine and vaginal bleeding, unspecified: Secondary | ICD-10-CM

## 2021-12-04 DIAGNOSIS — R519 Headache, unspecified: Secondary | ICD-10-CM | POA: Diagnosis not present

## 2021-12-04 LAB — POCT HEMOGLOBIN: Hemoglobin: 8.4 g/dL — AB (ref 11–14.6)

## 2021-12-04 MED ORDER — POLYETHYLENE GLYCOL 3350 17 GM/SCOOP PO POWD: 17.0000 g | Freq: Two times a day (BID) | ORAL | 1 refills | Status: DC | PRN

## 2021-12-04 NOTE — Patient Instructions (Signed)
Thank you for coming to see me today. It was a pleasure. Today we discussed your headaches I think they are tension headaches caused by blood loss/periods. I recommend tylenol '1000mg'$  every 4-6 hrs, ibuprofen 400-'600mg'$  every 8 hrs. Stay hydrated and sleep enough  We will get some labs today.  If they are abnormal or we need to do something about them, I will call you.  If they are normal, I will send you a message on MyChart (if it is active) or a letter in the mail.  If you don't hear from [place] in 2 weeks, please call the office at the number below.   Follow up with gyn   If you have any questions or concerns, please do not hesitate to call the office at [phone].  Best wishes,   Dr Posey Pronto

## 2021-12-04 NOTE — Progress Notes (Unsigned)
     SUBJECTIVE:   CHIEF COMPLAINT / HPI:   Victoria Rodriguez is a 41 y.o. female presents for headaches  AUB Seen by gyn in 5/23. Will need endometrial biopsy, scheduled for late rtthis month. On going heavy, painful periods. Uses tampon and pad and bleeds through both within 45 mins. She usually gets 4 days of heavy bleeding Currently on cycle.  Usually bleeds once a month. Gets dizzy, tired and frequent headaches. See below. Last iron infusion was a few months ago. Takes iron PRN. Endometrial ablation/hysterectomy   Headache  Onset: gradual  Location: front  Quality: ache Frequency: 3-4 x a week  Precipitating factors: no Prior treatment: '600mg'$  ibuprofen, sleeping   Associated Symptoms Nausea/vomiting: yes for the last 4 days. No vomiting Photophobia/phonophobia: no  Tearing of eyes: no  Sinus pain/pressure: no  Family hx migraine: no  Personal stressors: no  Relation to menstrual cycle: yes and before cycle, worse on cycle   Red Flags Fever: no  Neck pain/stiffness: no  Vision/speech/swallow/hearing difficulty: no  Focal weakness/numbness: no  Altered mental status: no  Trauma: no  New type of headache: no  Anticoagulant use: no  H/o cancer/HIV/Pregnancy: no    Tubes tied.  [place] Office Visit from 12/04/2021 in [place]  PHQ-9 Total Score 3        Health Maintenance Due  Topic   COVID-19 Vaccine (4 - Pfizer series)      PERTINENT  PMH / PSH:   OBJECTIVE:   BP 130/82   Pulse 72   Ht '5\' 3"'$  (1.6 m)   Wt 198 lb (89.8 kg)   LMP 11/28/2021   SpO2 95%   BMI 35.07 kg/m    General: Alert, no acute distress Cardio: Normal S1 and S2, RRR, no r/m/g Pulm: CTAB, normal work of breathing Abdomen: Bowel sounds normal. Abdomen soft and non-tender.  Extremities: No peripheral edema.  Neuro: Cranial nerves grossly intact   ASSESSMENT/PLAN:   No problem-specific Assessment & Plan notes found for this encounter.    Lattie Haw, MD PGY-3 [place]

## 2021-12-05 LAB — CBC
Hematocrit: 29.5 % — ABNORMAL LOW (ref 34.0–46.6)
Hemoglobin: 8.6 g/dL — ABNORMAL LOW (ref 11.1–15.9)
MCH: 21.4 pg — ABNORMAL LOW (ref 26.6–33.0)
MCHC: 29.2 g/dL — ABNORMAL LOW (ref 31.5–35.7)
MCV: 74 fL — ABNORMAL LOW (ref 79–97)
Platelets: 424 10*3/uL (ref 150–450)
RBC: 4.01 x10E6/uL (ref 3.77–5.28)
RDW: 16.2 % — ABNORMAL HIGH (ref 11.7–15.4)
WBC: 3.8 10*3/uL (ref 3.4–10.8)

## 2021-12-05 LAB — FERRITIN: Ferritin: 5 ng/mL — ABNORMAL LOW (ref 15–150)

## 2021-12-08 NOTE — Assessment & Plan Note (Addendum)
Likely tension headache/"menstrual migraine" as with tte headaches are related to patient's menstrual cycles.  Patient likely has ongoing iron deficiency anemia which worsening her headaches. No red flags for headache and cranial nerve examination normal today which is reassuring. Recommend: -Point-of-care hemoglobin today, CBC and ferritin -3 meals a day, including snacks -Adequate sleep -Staying adequately hydrated especially when she is on her cycle -Tylenol 1000 mg Q4-5HPRN, can also do ibuprofen 400to 600 mg  Q8HPRN (with caution as patient is anemic) -Strict ER precautions given to patient

## 2021-12-08 NOTE — Assessment & Plan Note (Signed)
Continue to follow-up with gynecology.  Endometrial biopsy later this month.

## 2021-12-08 NOTE — Assessment & Plan Note (Signed)
POCT hemoglobin 8.4, hemoglobin on CBC 8.6, ferritin 5.  Recommended patient continues ferrous gluconate daily.  Prescribed MiraLAX for constipation secondary to iron supplementation.  Discussed with Dr. McDiarmid who recommended that patient should follow-up with him for discussion of possible iron transfusion

## 2021-12-10 ENCOUNTER — Telehealth: Payer: Self-pay | Admitting: Family Medicine

## 2021-12-10 ENCOUNTER — Other Ambulatory Visit: Payer: Self-pay | Admitting: Family Medicine

## 2021-12-10 DIAGNOSIS — D500 Iron deficiency anemia secondary to blood loss (chronic): Secondary | ICD-10-CM

## 2021-12-10 MED ORDER — FERROUS GLUCONATE 324 (38 FE) MG PO TABS: 324.0000 mg | ORAL_TABLET | Freq: Every day | ORAL | 3 refills | Status: DC

## 2021-12-10 MED ORDER — METRONIDAZOLE 500 MG PO TABS: 500.0000 mg | ORAL_TABLET | Freq: Two times a day (BID) | ORAL | 0 refills | Status: AC

## 2021-12-10 NOTE — Telephone Encounter (Signed)
Called patient to discuss recent lab results of anemia.  Recommended that she takes the ferrous gluconate daily instead of every other day.  For the constipation she can take MiraLAX daily which I have already sent in.  I explained that Dr McDiarmid would like to see her in clinic for follow-up to determine whether she would benefit from another iron infusion.  Follow-up booked on 10th August at 9 AM.  Patient also reports that she was previously diagnosed with BV in May however the pharmacy did not give her the prescription as she was too late.  She is requesting another prescription for Flagyl.  I have sent in a prescription of Flagyl for 7 days.  Follow-up with PCP as needed.

## 2021-12-24 ENCOUNTER — Other Ambulatory Visit (HOSPITAL_COMMUNITY)
Admission: RE | Admit: 2021-12-24 | Discharge: 2021-12-24 | Disposition: A | Discharge status: Home / Self Care | Payer: Medicare Other | Source: Ambulatory Visit | Attending: Obstetrics and Gynecology | Admitting: Obstetrics and Gynecology

## 2021-12-24 ENCOUNTER — Other Ambulatory Visit: Payer: Self-pay

## 2021-12-24 ENCOUNTER — Ambulatory Visit (INDEPENDENT_AMBULATORY_CARE_PROVIDER_SITE_OTHER): Payer: Medicare Other | Admitting: Obstetrics and Gynecology

## 2021-12-24 VITALS — BP 121/76 | HR 88 | Ht 63.0 in | Wt 195.7 lb

## 2021-12-24 DIAGNOSIS — N939 Abnormal uterine and vaginal bleeding, unspecified: Secondary | ICD-10-CM | POA: Insufficient documentation

## 2021-12-24 DIAGNOSIS — N938 Other specified abnormal uterine and vaginal bleeding: Secondary | ICD-10-CM | POA: Diagnosis not present

## 2021-12-24 NOTE — Progress Notes (Addendum)
ENDOMETRIAL BIOPSY      Victoria Rodriguez is a 41 y.o. [id number] here for endometrial biopsy.  The indications for endometrial biopsy were reviewed.  Risks of the biopsy including cramping, bleeding, infection, uterine perforation, inadequate specimen and need for additional procedures were discussed. The patient states she understands and agrees to undergo procedure today. Consent was signed. Time out was performed.   Indications: abnormal uterine bleeding Urine HCG: neg  A bivalve speculum was placed into the vagina and the cervix was easily visualized and was prepped with Betadine x2. A single-toothed tenaculum was placed on the anterior lip of the cervix to stabilize it. The 3 mm pipelle was introduced into the endometrial cavity without difficulty to a depth of 11 cm, and a moderate amount of tissue was obtained and sent to pathology. This was repeated for a total of 2 passes. The instruments were removed from the patient's vagina. Minimal bleeding from the cervix at the tenaculum was noted.   The patient tolerated the procedure well. Routine post-procedure instructions were given to the patient.    Will base further management on results of biopsy. Pt desires definitive therapy for bleeding.  She desires hysterectomy.  I believe she would benefit from possible vaginal hysterectomy. Will post for procedure pending biopsy results  Lynnda Shields, MD

## 2021-12-28 LAB — SURGICAL PATHOLOGY

## 2021-12-31 ENCOUNTER — Telehealth: Payer: Self-pay

## 2021-12-31 NOTE — Telephone Encounter (Signed)
Call placed to pt. Spoke with pt. Pt agreeable to come to office to sign consent on 8/7. Pt currently on vacation in [place]. [place]

## 2021-12-31 NOTE — Telephone Encounter (Signed)
-----   Message from Francia Greaves sent at 12/31/2021  1:49 PM EDT ----- Regarding: NEED HYSTERECTOMY STATEMENT - SURGERY 09/26

## 2022-01-07 ENCOUNTER — Encounter: Payer: Self-pay | Admitting: Family Medicine

## 2022-01-07 ENCOUNTER — Ambulatory Visit (INDEPENDENT_AMBULATORY_CARE_PROVIDER_SITE_OTHER): Payer: Medicare Other | Admitting: Family Medicine

## 2022-01-07 VITALS — BP 117/61 | HR 69 | Ht 63.0 in | Wt 197.8 lb

## 2022-01-07 DIAGNOSIS — D509 Iron deficiency anemia, unspecified: Secondary | ICD-10-CM | POA: Diagnosis not present

## 2022-01-07 DIAGNOSIS — L732 Hidradenitis suppurativa: Secondary | ICD-10-CM

## 2022-01-07 DIAGNOSIS — N939 Abnormal uterine and vaginal bleeding, unspecified: Secondary | ICD-10-CM | POA: Diagnosis not present

## 2022-01-07 NOTE — Patient Instructions (Signed)
We are checking your iron and hemoglobin levels today.  Dr Gennie Eisinger will get you an appointment with [facility] for your hidradenitis.

## 2022-01-07 NOTE — Assessment & Plan Note (Addendum)
Established problem Lat iron transfusion March 2023.  No dizziness, near-syncope +/- fatigue She has been able to take her iron gluconate tablet every days since her of for anemia7/7/23.   Will check CBC and ferritin with retic CBC:  HGB 9.2 (L) 01/07/2022 0929   Lab Results  Component Value Date   FERRITIN 9 (L) 01/07/2022   I discussed results with Victoria Rodriguez by phone on 01/08/22.  Given her low ferritin, inappropriately low retic and continuing low Hgb, we agreed that iron transfusion to increase her endogenous hgb level prior to her planned hysterectomy would be prudent.  Order set used for Feraheme 510 mg IV infusion x a scheduled for short-stay hospital at [facility] on 01/15/22 at 11:00 am.

## 2022-01-07 NOTE — Progress Notes (Signed)
Victoria Rodriguez is alone Sources of clinical information for visit is/are patient. Nursing assessment for this office visit was reviewed with the patient for accuracy and revision.     Previous Report(s) Reviewed: historical medical records and lab reports     01/07/2022    8:59 AM  Depression screen PHQ 2/9  Decreased Interest 0  Down, Depressed, Hopeless 0  PHQ - 2 Score 0  Altered sleeping 0  Tired, decreased energy 1  Change in appetite 0  Feeling bad or failure about yourself  0  Trouble concentrating 0  Moving slowly or fidgety/restless 0  Suicidal thoughts 0  PHQ-9 Score 1  Difficult doing work/chores Not difficult at all   [place] Visit from 01/07/2022 in [place] Procedure visit from 12/24/2021 in [facility] [place] [facility] Office Visit from 12/04/2021 in [place]  Thoughts that you would be better off dead, or of hurting yourself in some way Not at all Not at all Not at all  PHQ-9 Total Score '1 1 3          '$ 10/27/2021    9:28 AM 01/19/2019    2:13 PM 07/17/2018    8:45 AM 01/02/2018    4:28 PM 09/28/2017    3:07 PM  [place] in the past year? 0 0 0 No No  Number falls in past yr: 0 0     Injury with Fall? 0      Follow up  F[place] evaluation completed          01/07/2022    8:59 AM 12/24/2021    5:06 PM 12/04/2021   10:56 AM  PHQ9 SCORE ONLY  PHQ-9 Total Score '1 1 3    '$ Adult vaccines due  Topic Date Due   TETANUS/TDAP  12/08/2025    Health Maintenance Due  Topic Date Due   COVID-19 Vaccine (4 - Pfizer series) 10/20/2020   INFLUENZA VACCINE  12/29/2021      History/P.E. limitations: none  Adult vaccines due  Topic Date Due   TETANUS/TDAP  12/08/2025   There are no preventive care reminders to display for this patient.  Health Maintenance Due  Topic Date Due   COVID-19 Vaccine (4 - Pfizer series) 10/20/2020   INFLUENZA VACCINE  12/29/2021      Chief Complaint  Patient presents with   anemia f/u     --------------------------------------------------------------------------------------------------------------------------------------------- Visit Problem List with A/P  Anemia, iron deficiency Established problem No dizziness, near-syncope +/- fatigue She has been able to take her iron gluconate tablet every days since her of for anemia7/7/23.   Will check CBC and ferritin with retic  Abnormal uterine bleeding (AUB) Established problem We discuseed results of her recent endometrial biopsy.  She has appointment for hysterectomy scheduled for September.   Hidradenitis Established problem Well Controlled and is at goal of only monthly flares of skin drainage and nudules with meses.  Unfortunately, her local dermatology practice is closing.  She would like to establish with another dermatology practice about her chronic hidradenitis.  [name]

## 2022-01-07 NOTE — Assessment & Plan Note (Signed)
Established problem We discuseed results of her recent endometrial biopsy.  She has appointment for hysterectomy scheduled for September.

## 2022-01-07 NOTE — Assessment & Plan Note (Signed)
Established problem Well Controlled and is at goal of only monthly flares of skin drainage and nudules with meses.  Unfortunately, her local dermatology practice is closing.  She would like to establish with another dermatology practice about her chronic hidradenitis.  Victoria Rodriguez

## 2022-01-08 ENCOUNTER — Encounter: Payer: Self-pay | Admitting: Family Medicine

## 2022-01-08 LAB — CBC
Hematocrit: 30.9 % — ABNORMAL LOW (ref 34.0–46.6)
Hemoglobin: 9.2 g/dL — ABNORMAL LOW (ref 11.1–15.9)
MCH: 22.2 pg — ABNORMAL LOW (ref 26.6–33.0)
MCHC: 29.8 g/dL — ABNORMAL LOW (ref 31.5–35.7)
MCV: 75 fL — ABNORMAL LOW (ref 79–97)
Platelets: 394 10*3/uL (ref 150–450)
RBC: 4.14 x10E6/uL (ref 3.77–5.28)
RDW: 19.6 % — ABNORMAL HIGH (ref 11.7–15.4)
WBC: 4 10*3/uL (ref 3.4–10.8)

## 2022-01-08 LAB — RETICULOCYTES: Retic Ct Pct: 1.6 % (ref 0.6–2.6)

## 2022-01-08 LAB — FERRITIN: Ferritin: 9 ng/mL — ABNORMAL LOW (ref 15–150)

## 2022-01-08 NOTE — Telephone Encounter (Signed)
-----   Message from [place], MD sent at 01/08/2022  8:48 AM EDT ----- Regarding: Let patient know appointment for iron infusion Please let Victoria Rodriguez know she has an appointment for an iron transfusion at [facility] short-stay hospital 11 am on Friday, 01/15/22.  She should arrive 30 minutes before appointment to register at admissions.

## 2022-01-10 ENCOUNTER — Encounter: Payer: Self-pay | Admitting: Radiology

## 2022-01-15 ENCOUNTER — Ambulatory Visit (HOSPITAL_COMMUNITY)
Admission: RE | Admit: 2022-01-15 | Discharge: 2022-01-15 | Disposition: A | Discharge status: Home / Self Care | Payer: Medicare Other | Source: Ambulatory Visit | Attending: Family Medicine | Admitting: Family Medicine

## 2022-01-15 DIAGNOSIS — D509 Iron deficiency anemia, unspecified: Secondary | ICD-10-CM | POA: Diagnosis not present

## 2022-01-15 DIAGNOSIS — N939 Abnormal uterine and vaginal bleeding, unspecified: Secondary | ICD-10-CM

## 2022-01-15 MED ORDER — SODIUM CHLORIDE 0.9 % IV SOLN
510.0000 mg | Freq: Once | INTRAVENOUS | Status: AC
  Administered 2022-01-15: 510 mg via INTRAVENOUS
  Filled 2022-01-15: qty 510

## 2022-01-21 ENCOUNTER — Telehealth: Payer: Self-pay

## 2022-01-21 NOTE — Telephone Encounter (Signed)
Call placed to pt. Spoke with pt. Pt advised about signing hysterectomy statement. Will come to office today to sign. Surgery on 02/23/22. Colletta [place], RNC

## 2022-01-21 NOTE — Telephone Encounter (Signed)
-----   Message from Francia Greaves sent at 01/20/2022 12:19 PM EDT ----- Regarding: [place] - Surgery 09/26

## 2022-01-26 ENCOUNTER — Other Ambulatory Visit: Payer: Self-pay

## 2022-01-26 ENCOUNTER — Emergency Department (HOSPITAL_COMMUNITY): Payer: Medicare Other

## 2022-01-26 ENCOUNTER — Emergency Department (HOSPITAL_COMMUNITY)
Admission: EM | Admit: 2022-01-26 | Discharge: 2022-01-26 | Disposition: A | Discharge status: Home / Self Care | Payer: Medicare Other | Attending: Emergency Medicine | Admitting: Emergency Medicine

## 2022-01-26 ENCOUNTER — Encounter: Payer: Self-pay | Admitting: *Deleted

## 2022-01-26 DIAGNOSIS — R109 Unspecified abdominal pain: Secondary | ICD-10-CM | POA: Insufficient documentation

## 2022-01-26 DIAGNOSIS — N939 Abnormal uterine and vaginal bleeding, unspecified: Secondary | ICD-10-CM | POA: Insufficient documentation

## 2022-01-26 DIAGNOSIS — R079 Chest pain, unspecified: Secondary | ICD-10-CM | POA: Insufficient documentation

## 2022-01-26 DIAGNOSIS — R0602 Shortness of breath: Secondary | ICD-10-CM | POA: Diagnosis not present

## 2022-01-26 DIAGNOSIS — R42 Dizziness and giddiness: Secondary | ICD-10-CM | POA: Diagnosis not present

## 2022-01-26 DIAGNOSIS — R0789 Other chest pain: Secondary | ICD-10-CM | POA: Diagnosis not present

## 2022-01-26 LAB — BASIC METABOLIC PANEL
Anion gap: 5 (ref 5–15)
BUN: 6 mg/dL (ref 6–20)
CO2: 22 mmol/L (ref 22–32)
Calcium: 8.3 mg/dL — ABNORMAL LOW (ref 8.9–10.3)
Chloride: 109 mmol/L (ref 98–111)
Creatinine, Ser: 0.54 mg/dL (ref 0.44–1.00)
GFR, Estimated: >60 mL/min (ref >60)
Glucose, Bld: 110 mg/dL — ABNORMAL HIGH (ref 70–99)
Potassium: 3.2 mmol/L — ABNORMAL LOW (ref 3.5–5.1)
Sodium: 136 mmol/L (ref 135–145)

## 2022-01-26 LAB — CBC
HCT: 33.9 % — ABNORMAL LOW (ref 36.0–46.0)
Hemoglobin: 10.2 g/dL — ABNORMAL LOW (ref 12.0–15.0)
MCH: 23.8 pg — ABNORMAL LOW (ref 26.0–34.0)
MCHC: 30.1 g/dL (ref 30.0–36.0)
MCV: 79 fL — ABNORMAL LOW (ref 80.0–100.0)
Platelets: 284 10*3/uL (ref 150–400)
RBC: 4.29 MIL/uL (ref 3.87–5.11)
RDW: 22.8 % — ABNORMAL HIGH (ref 11.5–15.5)
WBC: 3.9 10*3/uL — ABNORMAL LOW (ref 4.0–10.5)
nRBC: 0 % (ref 0.0–0.2)

## 2022-01-26 LAB — I-STAT BETA HCG BLOOD, ED (MC, WL, AP ONLY): I-stat hCG, quantitative: <5 m[IU]/mL (ref <5)

## 2022-01-26 LAB — TROPONIN I (HIGH SENSITIVITY): Troponin I (High Sensitivity): 3 ng/L (ref <18)

## 2022-01-26 MED ORDER — TRANEXAMIC ACID 650 MG PO TABS: 1300.0000 mg | ORAL_TABLET | Freq: Three times a day (TID) | ORAL | 0 refills | Status: AC

## 2022-01-26 MED ORDER — MEGESTROL ACETATE 40 MG PO TABS: 40.0000 mg | ORAL_TABLET | Freq: Two times a day (BID) | ORAL | 0 refills | Status: AC

## 2022-01-26 NOTE — Progress Notes (Signed)
Patient came in and signed Hysterectomy consent. Staci Acosta

## 2022-01-26 NOTE — ED Provider Notes (Signed)
[facility] EMERGENCY DEPARTMENT Provider Note   CSN: [id number] Arrival date & time: 01/26/22  0754     History  Chief Complaint  Patient presents with   Chest Pain   Vaginal Bleeding   Dizziness    Victoria Rodriguez is a 41 y.o. female with history of AUB and anemia requiring iron transfusions previously presenting for vaginal bleeding with chest pain and dizziness. Patient reports that her menstrual cycle started yesterday and it has been extremely heavy, she is having to use ultra-sized tampons about every 30-45 minutes. She began having some chest pressure on the left size and dizziness.  Has a hysterectomy scheduled for September. Has tried OCPs, NSAIDs, IUD, Provera, and TXA without success in the past. She most recently had an iron transfusion on 01/15/2022   Chest Pain Associated symptoms: abdominal pain, dizziness, fatigue and shortness of breath   Associated symptoms: no cough, no diaphoresis, no dysphagia, no nausea, no palpitations, no vomiting and no weakness   Vaginal Bleeding Associated symptoms: abdominal pain, dizziness and fatigue   Associated symptoms: no dysuria and no nausea   Dizziness Associated symptoms: chest pain and shortness of breath   Associated symptoms: no diarrhea, no nausea, no palpitations, no vomiting and no weakness        Home Medications Prior to Admission medications   Medication Sig Start Date End Date Taking? Authorizing Provider  megestrol (MEGACE) 40 MG tablet Take 1 tablet (40 mg total) by mouth 2 (two) times daily for 10 days. 01/26/22 02/05/22 Yes Alleigh Mollica, DO  tranexamic acid (LYSTEDA) 650 MG TABS tablet Take 2 tablets (1,300 mg total) by mouth 3 (three) times daily for 5 days. 01/26/22 01/31/22 Yes Cornelious Diven, DO  acetaminophen (TYLENOL) 650 MG CR tablet Take 2 tablets (1,300 mg total) by mouth every 8 (eight) hours as needed for pain. 05/15/21   McDiarmid, Blane Ohara, MD  ferrous gluconate (FERGON) 324 MG  tablet Take 1 tablet (324 mg total) by mouth daily with breakfast. 12/10/21   Lattie Haw, MD  ibuprofen (ADVIL) 600 MG tablet Take 1 tablet (600 mg total) by mouth every 8 (eight) hours as needed. 09/07/21   McDiarmid, Blane Ohara, MD  polyethylene glycol powder (GLYCOLAX/MIRALAX) 17 GM/SCOOP powder Take 17 g by mouth 2 (two) times daily as needed. 12/04/21   Lattie Haw, MD      Allergies    Hydrocodone-acetaminophen and Vicodin [hydrocodone-acetaminophen]    Review of Systems   Review of Systems  Constitutional:  Positive for activity change and fatigue. Negative for chills and diaphoresis.  HENT:  Negative for congestion and trouble swallowing.   Respiratory:  Positive for chest tightness and shortness of breath. Negative for cough.   Cardiovascular:  Positive for chest pain. Negative for palpitations and leg swelling.  Gastrointestinal:  Positive for abdominal pain. Negative for diarrhea, nausea and vomiting.  Genitourinary:  Positive for menstrual problem and vaginal bleeding. Negative for difficulty urinating and dysuria.  Musculoskeletal:  Negative for arthralgias.  Neurological:  Positive for dizziness. Negative for weakness.    Physical Exam Updated Vital Signs BP (!) 170/81 (BP Location: Right Arm)   Pulse 62   Temp 98.4 F (36.9 C) (Oral)   Resp 14   Ht '5\' 3"'$  (1.6 m)   Wt 89.8 kg   LMP 01/25/2022   SpO2 100%   BMI 35.07 kg/m  Physical Exam Constitutional:      Appearance: She is well-developed.  HENT:     Head:  Normocephalic and atraumatic.  Cardiovascular:     Rate and Rhythm: Normal rate and regular rhythm.     Heart sounds: Normal heart sounds.  Pulmonary:     Effort: Pulmonary effort is normal. No tachypnea or respiratory distress.     Breath sounds: Normal breath sounds.  Chest:     Chest wall: No mass or tenderness.  Abdominal:     General: Bowel sounds are normal.     Palpations: Abdomen is soft.     Tenderness: There is abdominal tenderness (mildly TTP  in LLQ).  Musculoskeletal:        General: Normal range of motion.     Cervical back: Normal range of motion and neck supple.  Skin:    General: Skin is warm and dry.  Neurological:     Mental Status: She is alert.     ED Results / Procedures / Treatments   Labs (all labs ordered are listed, but only abnormal results are displayed) Labs Reviewed  BASIC METABOLIC PANEL - Abnormal; Notable for the following components:      Result Value   Potassium 3.2 (*)    Glucose, Bld 110 (*)    Calcium 8.3 (*)    All other components within normal limits  CBC - Abnormal; Notable for the following components:   WBC 3.9 (*)    Hemoglobin 10.2 (*)    HCT 33.9 (*)    MCV 79.0 (*)    MCH 23.8 (*)    RDW 22.8 (*)    All other components within normal limits  I-STAT BETA HCG BLOOD, ED ([facility], [facility], [facility] ONLY)  TROPONIN I (HIGH SENSITIVITY)    EKG EKG Interpretation  Date/Time:  Tuesday January 26 2022 07:56:37 EDT Ventricular Rate:  59 PR Interval:  158 QRS Duration: 98 QT Interval:  418 QTC Calculation: 413 R Axis:   68 Text Interpretation: Sinus bradycardia with sinus arrhythmia Otherwise normal ECG When compared with ECG of 26-Sep-2017 06:47, PREVIOUS ECG IS PRESENT No significant change since last tracing Confirmed by Lacretia Leigh ([id number]) on 01/26/2022 9:49:50 AM  Radiology DG Chest 2 View  Result Date: 01/26/2022 CLINICAL DATA:  Chest pain and dizziness. EXAM: CHEST - 2 VIEW COMPARISON:  Chest radiographs 09/26/2017 FINDINGS: The cardiomediastinal silhouette is unchanged with normal heart size. There is mild chronic prominence of the interstitial markings. No overt pulmonary edema, airspace consolidation, pleural effusion, or pneumothorax is identified. No acute osseous abnormality is seen. IMPRESSION: No active cardiopulmonary disease. Electronically Signed   By: Logan Bores M.D.   On: 01/26/2022 08:41    Procedures Procedures   Medications Ordered in ED Medications - No data to  display  ED Course/ Medical Decision Making/ A&P                           Medical Decision Making Amount and/or Complexity of Data Reviewed Labs: ordered. Radiology: ordered.   Victoria Rodriguez is a 41 y.o. female with history of AUB and anemia requiring iron transfusions previously presenting for vaginal bleeding with chest pain and dizziness. Differential includes symptomatic anemia, PE, MI, atypical chest pain.   On examination, patient is overall well-appearing without any obvious acute process. Patient describes the chest pain as a left sided pressure with some mild shortness of breath that started yesterday. Reassuringly EKG with sinus bradycardia and Troponin 3. Given patient's stability and lack of risk factors, do not feel that PE is very likely. Consideration  for anxiety contributing to some of the symptoms.   Discussed with on-call Ob provider Dr. Roselie Awkward as patient has previously done trials of multiple medications without symptom improvement. Dr. Roselie Awkward recommended doing TXA for 5 days as well as Megace at the same time for 10 days. Patient can schedule to follow-up with Ob/gyn before her surgery as needed. Discussed return precautions and also recommended patient follow-up with PCP regarding blood pressure elevations.   Final Clinical Impression(s) / ED Diagnoses Final diagnoses:  Vaginal bleeding  Chest pain, unspecified type    Rx / DC Orders ED Discharge Orders          Ordered    tranexamic acid (LYSTEDA) 650 MG TABS tablet  3 times daily        01/26/22 1111    megestrol (MEGACE) 40 MG tablet  2 times daily        01/26/22 1111              Trammell Bowden, DO 01/26/22 1115    Lacretia Leigh, MD 01/27/22 [phone]

## 2022-01-26 NOTE — ED Provider Notes (Signed)
I saw and evaluated the patient, reviewed the resident's note and I agree with the findings and plan.  EKG Interpretation  Date/Time:  Tuesday January 26 2022 07:56:37 EDT Ventricular Rate:  59 PR Interval:  158 QRS Duration: 98 QT Interval:  418 QTC Calculation: 413 R Axis:   68 Text Interpretation: Sinus bradycardia with sinus arrhythmia Otherwise normal ECG When compared with ECG of 26-Sep-2017 06:47, PREVIOUS ECG IS PRESENT No significant change since last tracing Confirmed by Lacretia Leigh ([id number]) on 01/26/2022 9:49:50 AM    Patient presented with heavy menstrual cycle as well as some episodic chest pain which began when her cycle started.  Cardiac work-up here is negative.  Hemoglobin is stable.  Consult gynecology about possible hormone therapy and patient be discharged home   Lacretia Leigh, MD 01/26/22 1105

## 2022-01-26 NOTE — Discharge Instructions (Addendum)
You were seen due to your chest pain and dizziness with you vaginal bleeding. Your anemia is better than it has been recently. We talked with Dr. Roselie Awkward and are going to send in prescriptions for both Tranexamic acid and Megace to be taken together. Make sure to follow the instructions on the bottles.   If you have any acute worsening of the chest pain or shortness of breath come back to the ER. Also feel free to call the Ob/Gyn office if  you are having further bleeding.

## 2022-01-26 NOTE — ED Triage Notes (Signed)
Pt. Stated, I started bleeding heavy on Monday and Im soaking up a pad and tampom every 30 minutes. My chest hurts some and Im a little dizzy.

## 2022-01-26 NOTE — ED Triage Notes (Signed)
Pt. Stated, Im due for a hysterectomy next month.

## 2022-01-27 ENCOUNTER — Encounter: Payer: Self-pay | Admitting: *Deleted

## 2022-01-29 ENCOUNTER — Encounter: Payer: Self-pay | Admitting: Family Medicine

## 2022-02-04 ENCOUNTER — Other Ambulatory Visit (HOSPITAL_COMMUNITY)
Admission: RE | Admit: 2022-02-04 | Discharge: 2022-02-04 | Disposition: A | Discharge status: Home / Self Care | Payer: Medicare Other | Source: Ambulatory Visit | Attending: Family Medicine | Admitting: Family Medicine

## 2022-02-04 ENCOUNTER — Ambulatory Visit (INDEPENDENT_AMBULATORY_CARE_PROVIDER_SITE_OTHER): Payer: Medicare Other | Admitting: Family Medicine

## 2022-02-04 VITALS — BP 128/84 | HR 74 | Ht 63.0 in | Wt 195.4 lb

## 2022-02-04 DIAGNOSIS — Z113 Encounter for screening for infections with a predominantly sexual mode of transmission: Secondary | ICD-10-CM | POA: Insufficient documentation

## 2022-02-04 DIAGNOSIS — B9689 Other specified bacterial agents as the cause of diseases classified elsewhere: Secondary | ICD-10-CM | POA: Diagnosis not present

## 2022-02-04 DIAGNOSIS — N898 Other specified noninflammatory disorders of vagina: Secondary | ICD-10-CM | POA: Diagnosis not present

## 2022-02-04 DIAGNOSIS — L732 Hidradenitis suppurativa: Secondary | ICD-10-CM | POA: Diagnosis not present

## 2022-02-04 DIAGNOSIS — N760 Acute vaginitis: Secondary | ICD-10-CM

## 2022-02-04 LAB — POCT WET PREP (WET MOUNT)
Clue Cells Wet Prep Whiff POC: POSITIVE
Trichomonas Wet Prep HPF POC: ABSENT
WBC, Wet Prep HPF POC: NONE SEEN

## 2022-02-04 MED ORDER — METRONIDAZOLE 500 MG PO TABS: 500.0000 mg | ORAL_TABLET | Freq: Two times a day (BID) | ORAL | 0 refills | Status: AC

## 2022-02-04 NOTE — Patient Instructions (Addendum)
It was nice seeing you today!  I will update you with results on MyChart.  Stay well, Misty Rago, MD [facility] [phone]  --  Make sure to check out at the front desk before you leave today.  Please arrive at least 15 minutes prior to your scheduled appointments.  If you had blood work today, I will send you a MyChart message or a letter if results are normal. Otherwise, I will give you a call.  If you had a referral placed, they will call you to set up an appointment. Please give us a call if you don't hear back in the next 2 weeks.  If you need additional refills before your next appointment, please call your pharmacy first.  

## 2022-02-04 NOTE — Progress Notes (Signed)
    SUBJECTIVE:   CHIEF COMPLAINT / HPI:  Chief Complaint  Patient presents with   Flare up   Std testing    Patient reports vaginal discharge ongoing for about 1 week. She also has some painful lumps, one one her labia and one in her inguinal fold for about 1 week. She thinks it is a flare-up of her HS but wants to make sure no STIs. She requests STI testing including herpes testing. No prior history of genital herpes. Denies any new sexual partners in the past year.  She has upcoming dermatology appointment next month.  PERTINENT  PMH / PSH: HS, AUB  Patient Care Team: McDiarmid, Blane Ohara, MD as PCP - General (Family Medicine)   OBJECTIVE:   BP 128/84   Pulse 74   Ht '5\' 3"'$  (1.6 m)   Wt 195 lb 6.4 oz (88.6 kg)   LMP 01/25/2022   SpO2 100%   BMI 34.61 kg/m   Physical Exam Exam conducted with a chaperone present.  Constitutional:      General: She is not in acute distress.    Appearance: She is obese.  Cardiovascular:     Rate and Rhythm: Normal rate.  Pulmonary:     Effort: Pulmonary effort is normal. No respiratory distress.  Genitourinary:    Comments: Erythematous tender papule noted on the right labial fold, no active drainage. Left inguinal fold with approximately 0.5 cm tender firm nodule without overlying erythema or drainage. Musculoskeletal:     Cervical back: Neck supple.  Neurological:     Mental Status: She is alert.         02/04/2022    2:02 PM  Depression screen PHQ 2/9  Decreased Interest 1  Down, Depressed, Hopeless 1  PHQ - 2 Score 2  Altered sleeping 0  Tired, decreased energy 1  Change in appetite 0  Feeling bad or failure about yourself  0  Trouble concentrating 0  Moving slowly or fidgety/restless 0  Suicidal thoughts 0  PHQ-9 Score 3     {Show previous vital signs (optional):23777}    ASSESSMENT/PLAN:   Vaginal discharge Wet prep shows BV. - metronidazole 500 mg BID x 7d - f/u GC/chlamydia - HSV culture obtained on labial  lesion per patient request  Hidradenitis Perennial hidradenitis, question if possible labial involvement as well.  Appears mild, no active drainage or significant erythema.  Do not feel oral antibiotics are indicated at this time.    Return if symptoms worsen or fail to improve.   Zola Button, MD [place]

## 2022-02-04 NOTE — Assessment & Plan Note (Signed)
Perennial hidradenitis, question if possible labial involvement as well.  Appears mild, no active drainage or significant erythema.  Do not feel oral antibiotics are indicated at this time.

## 2022-02-05 LAB — CERVICOVAGINAL ANCILLARY ONLY
Chlamydia: NEGATIVE
Comment: NEGATIVE
Comment: NEGATIVE
Comment: NORMAL
Neisseria Gonorrhea: NEGATIVE
Trichomonas: NEGATIVE

## 2022-02-07 LAB — HERPES SIMPLEX VIRUS CULTURE

## 2022-02-08 NOTE — Addendum Note (Signed)
Addended by: Griffin Basil on: 02/08/2022 06:01 PM   Modules accepted: Orders

## 2022-02-12 NOTE — Pre-Procedure Instructions (Signed)
Surgical Instructions    Your procedure is scheduled on Tuesday 02/23/22.   Report to [facility] Main Entrance "A" at 07:45 A.M., then check in with the Admitting office.  Call this number if you have problems the morning of surgery:  [phone]   If you have any questions prior to your surgery date call [phone]: Open Monday-Friday 8am-4pm    Remember:  Do not eat or drink after midnight the night before your surgery     Take these medicines the morning of surgery with A SIP OF WATER:   NONE   As of today, STOP taking any Aspirin (unless otherwise instructed by your surgeon) Aleve, Naproxen, Ibuprofen, Motrin, Advil, Goody's, BC's, all herbal medications, fish oil, and all vitamins.           Do not wear jewelry or makeup. Do not wear lotions, powders, perfumes/cologne or deodorant. Do not shave 48 hours prior to surgery.  Men may shave face and neck. Do not bring valuables to the hospital. Do not wear nail polish, gel polish, artificial nails, or any other type of covering on natural nails (fingers and toes) If you have artificial nails or gel coating that need to be removed by a nail salon, please have this removed prior to surgery. Artificial nails or gel coating may interfere with anesthesia's ability to adequately monitor your vital signs.  [facility] is not responsible for any belongings or valuables.    Do NOT Smoke (Tobacco/Vaping)  24 hours prior to your procedure  If you use a CPAP at night, you may bring your mask for your overnight stay.   Contacts, glasses, hearing aids, dentures or partials may not be worn into surgery, please bring cases for these belongings   For patients admitted to the hospital, discharge time will be determined by your treatment team.   Patients discharged the day of surgery will not be allowed to drive home, and someone needs to stay with them for 24 hours.   SURGICAL WAITING ROOM VISITATION Patients having surgery or a procedure  may have no more than 2 support people in the waiting area - these visitors may rotate.   Children under the age of 30 must have an adult with them who is not the patient. If the patient needs to stay at the hospital during part of their recovery, the visitor guidelines for inpatient rooms apply. Pre-op nurse will coordinate an appropriate time for 1 support person to accompany patient in pre-op.  This support person may not rotate.   Please refer to the [facility] website for the visitor guidelines for Inpatients (after your surgery is over and you are in a regular room).    Special instructions:    Oral Hygiene is also important to reduce your risk of infection.  Remember - BRUSH YOUR TEETH THE MORNING OF SURGERY WITH YOUR REGULAR TOOTHPASTE   - Preparing For Surgery  Before surgery, you can play an important role. Because skin is not sterile, your skin needs to be as free of germs as possible. You can reduce the number of germs on your skin by washing with CHG (chlorahexidine gluconate) Soap before surgery.  CHG is an antiseptic cleaner which kills germs and bonds with the skin to continue killing germs even after washing.     Please do not use if you have an allergy to CHG or antibacterial soaps. If your skin becomes reddened/irritated stop using the CHG.  Do not shave (including legs and underarms) for  at least 48 hours prior to first CHG shower. It is OK to shave your face.  Please follow these instructions carefully.     Shower the NIGHT BEFORE SURGERY and the MORNING OF SURGERY with CHG Soap.   If you chose to wash your hair, wash your hair first as usual with your normal shampoo. After you shampoo, rinse your hair and body thoroughly to remove the shampoo.  Then [employer] and genitals (private parts) with your normal soap and rinse thoroughly to remove soap.  After that Use CHG Soap as you would any other liquid soap. You can apply CHG directly to the skin and wash  gently with a scrungie or a clean washcloth.   Apply the CHG Soap to your body ONLY FROM THE NECK DOWN.  Do not use on open wounds or open sores. Avoid contact with your eyes, ears, mouth and genitals (private parts). Wash Face and genitals (private parts)  with your normal soap.   Wash thoroughly, paying special attention to the area where your surgery will be performed.  Thoroughly rinse your body with warm water from the neck down.  DO NOT shower/wash with your normal soap after using and rinsing off the CHG Soap.  Pat yourself dry with a CLEAN TOWEL.  Wear CLEAN PAJAMAS to bed the night before surgery  Place CLEAN SHEETS on your bed the night before your surgery  DO NOT SLEEP WITH PETS.   Day of Surgery:  Take a shower with CHG soap. Wear Clean/Comfortable clothing the morning of surgery Do not apply any deodorants/lotions.   Remember to brush your teeth WITH YOUR REGULAR TOOTHPASTE.    If you received a COVID test during your pre-op visit, it is requested that you wear a mask when out in public, stay away from anyone that may not be feeling well, and notify your surgeon if you develop symptoms. If you have been in contact with anyone that has tested positive in the last 10 days, please notify your surgeon.    Please read over the following fact sheets that you were given.

## 2022-02-15 ENCOUNTER — Encounter (HOSPITAL_COMMUNITY): Payer: Self-pay

## 2022-02-15 ENCOUNTER — Other Ambulatory Visit: Payer: Self-pay

## 2022-02-15 ENCOUNTER — Encounter (HOSPITAL_COMMUNITY)
Admission: RE | Admit: 2022-02-15 | Discharge: 2022-02-15 | Disposition: A | Discharge status: Home / Self Care | Payer: Medicare Other | Source: Ambulatory Visit | Attending: Obstetrics and Gynecology | Admitting: Obstetrics and Gynecology

## 2022-02-15 DIAGNOSIS — Z01812 Encounter for preprocedural laboratory examination: Secondary | ICD-10-CM | POA: Insufficient documentation

## 2022-02-15 DIAGNOSIS — N939 Abnormal uterine and vaginal bleeding, unspecified: Secondary | ICD-10-CM | POA: Diagnosis not present

## 2022-02-15 HISTORY — DX: Anemia, unspecified: D64.9

## 2022-02-15 LAB — CBC
HCT: 35.4 % — ABNORMAL LOW (ref 36.0–46.0)
Hemoglobin: 10.5 g/dL — ABNORMAL LOW (ref 12.0–15.0)
MCH: 24.5 pg — ABNORMAL LOW (ref 26.0–34.0)
MCHC: 29.7 g/dL — ABNORMAL LOW (ref 30.0–36.0)
MCV: 82.5 fL (ref 80.0–100.0)
Platelets: 303 10*3/uL (ref 150–400)
RBC: 4.29 MIL/uL (ref 3.87–5.11)
RDW: 22.3 % — ABNORMAL HIGH (ref 11.5–15.5)
WBC: 4.3 10*3/uL (ref 4.0–10.5)
nRBC: 0 % (ref 0.0–0.2)

## 2022-02-15 LAB — TYPE AND SCREEN
ABO/RH(D): B POS
Antibody Screen: NEGATIVE

## 2022-02-15 NOTE — Progress Notes (Signed)
PCP - Dr. Sherren Mocha McDiarmid Cardiologist - Denies  PPM/ICD - Denies Device Orders - n/a Rep Notified - n/a  Chest x-ray - 01/26/2022 EKG - 01/26/2022 Stress Test - Denies ECHO - Denies Cardiac Cath - Denies  Sleep Study - Denies CPAP - n/a  No DM  Blood Thinner Instructions: n/a Aspirin Instructions: n/a  NPO after midnight  COVID TEST- n/a   Anesthesia review: No.  Patient denies shortness of breath, fever, cough and chest pain at PAT appointment   All instructions explained to the patient, with a verbal understanding of the material. Patient agrees to go over the instructions while at home for a better understanding. Patient also instructed to self quarantine after being tested for COVID-19. The opportunity to ask questions was provided.

## 2022-02-19 ENCOUNTER — Ambulatory Visit (INDEPENDENT_AMBULATORY_CARE_PROVIDER_SITE_OTHER): Payer: Medicare Other | Admitting: Obstetrics and Gynecology

## 2022-02-19 ENCOUNTER — Other Ambulatory Visit: Payer: Self-pay

## 2022-02-19 ENCOUNTER — Encounter: Payer: Self-pay | Admitting: Obstetrics and Gynecology

## 2022-02-19 VITALS — BP 150/85 | HR 72 | Ht 63.0 in | Wt 192.1 lb

## 2022-02-19 DIAGNOSIS — N939 Abnormal uterine and vaginal bleeding, unspecified: Secondary | ICD-10-CM | POA: Diagnosis not present

## 2022-02-19 DIAGNOSIS — Z01818 Encounter for other preprocedural examination: Secondary | ICD-10-CM
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
## 2022-02-20 ENCOUNTER — Ambulatory Visit
Admission: EM | Admit: 2022-02-20 | Discharge: 2022-02-20 | Disposition: A | Discharge status: Home / Self Care | Payer: Medicare Other | Attending: Physician Assistant | Admitting: Physician Assistant

## 2022-02-20 ENCOUNTER — Encounter: Payer: Self-pay | Admitting: Physician Assistant

## 2022-02-20 DIAGNOSIS — N760 Acute vaginitis: Secondary | ICD-10-CM | POA: Diagnosis not present

## 2022-02-20 DIAGNOSIS — R300 Dysuria: Secondary | ICD-10-CM | POA: Diagnosis not present

## 2022-02-20 LAB — POCT URINALYSIS DIP (MANUAL ENTRY)
Bilirubin, UA: NEGATIVE
Glucose, UA: NEGATIVE mg/dL
Ketones, POC UA: NEGATIVE mg/dL
Leukocytes, UA: NEGATIVE
Nitrite, UA: NEGATIVE
Protein Ur, POC: NEGATIVE mg/dL
Spec Grav, UA: 1.025 (ref 1.010–1.025)
Urobilinogen, UA: 0.2 E.U./dL
pH, UA: 5.5 (ref 5.0–8.0)
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
## 2022-02-20 NOTE — ED Triage Notes (Signed)
Pt presents to uc with co of urinary frequency and dysuria with vaginal discomfort for 2 days. Pt reports need for sti swab

## 2022-02-21 LAB — URINE CULTURE: Culture: NO GROWTH

## 2022-02-22 ENCOUNTER — Encounter: Payer: Self-pay | Admitting: Obstetrics and Gynecology

## 2022-02-22 ENCOUNTER — Telehealth (HOSPITAL_COMMUNITY): Payer: Self-pay

## 2022-02-22 LAB — CERVICOVAGINAL ANCILLARY ONLY
Bacterial Vaginitis (gardnerella): NEGATIVE
Candida Glabrata: NEGATIVE
Candida Vaginitis: POSITIVE — AB
Chlamydia: NEGATIVE
Comment: NEGATIVE
Comment: NEGATIVE
Comment: NEGATIVE
Comment: NEGATIVE
Comment: NEGATIVE
Comment: NORMAL
Neisseria Gonorrhea: NEGATIVE
Trichomonas: NEGATIVE

## 2022-02-22 MED ORDER — FLUCONAZOLE 150 MG PO TABS: 150.0000 mg | ORAL_TABLET | Freq: Every day | ORAL | 0 refills | Status: DC

## 2022-02-23 ENCOUNTER — Observation Stay (HOSPITAL_COMMUNITY)
Admission: RE | Admit: 2022-02-23 | Discharge: 2022-02-24 | Disposition: A | Discharge status: Home / Self Care | Payer: Medicare Other | Attending: Obstetrics and Gynecology | Admitting: Obstetrics and Gynecology

## 2022-02-23 ENCOUNTER — Encounter (HOSPITAL_COMMUNITY)
Admission: RE | Disposition: A | Discharge status: Home / Self Care | Payer: Self-pay | Source: Home / Self Care | Attending: Obstetrics and Gynecology

## 2022-02-23 ENCOUNTER — Other Ambulatory Visit: Payer: Self-pay

## 2022-02-23 ENCOUNTER — Encounter (HOSPITAL_COMMUNITY): Payer: Self-pay | Admitting: Obstetrics and Gynecology

## 2022-02-23 ENCOUNTER — Ambulatory Visit (HOSPITAL_COMMUNITY): Payer: Medicare Other | Admitting: Anesthesiology

## 2022-02-23 ENCOUNTER — Ambulatory Visit (HOSPITAL_BASED_OUTPATIENT_CLINIC_OR_DEPARTMENT_OTHER): Payer: Medicare Other | Admitting: Anesthesiology

## 2022-02-23 DIAGNOSIS — D251 Intramural leiomyoma of uterus: Secondary | ICD-10-CM

## 2022-02-23 DIAGNOSIS — Z98891 History of uterine scar from previous surgery: Secondary | ICD-10-CM | POA: Diagnosis not present

## 2022-02-23 DIAGNOSIS — Z90710 Acquired absence of both cervix and uterus: Secondary | ICD-10-CM

## 2022-02-23 DIAGNOSIS — N939 Abnormal uterine and vaginal bleeding, unspecified: Secondary | ICD-10-CM | POA: Diagnosis not present

## 2022-02-23 DIAGNOSIS — D219 Benign neoplasm of connective and other soft tissue, unspecified: Secondary | ICD-10-CM | POA: Diagnosis not present

## 2022-02-23 DIAGNOSIS — D259 Leiomyoma of uterus, unspecified: Principal | ICD-10-CM | POA: Insufficient documentation

## 2022-02-23 DIAGNOSIS — N8003 Adenomyosis of the uterus: Secondary | ICD-10-CM

## 2022-02-23 DIAGNOSIS — N9489 Other specified conditions associated with female genital organs and menstrual cycle: Secondary | ICD-10-CM | POA: Diagnosis not present

## 2022-02-23 HISTORY — PX: VAGINAL HYSTERECTOMY: SHX2639

## 2022-02-23 HISTORY — DX: Acquired absence of both cervix and uterus: Z90.710

## 2022-02-23 LAB — POCT PREGNANCY, URINE: Preg Test, Ur: NEGATIVE

## 2022-02-23 SURGERY — HYSTERECTOMY, VAGINAL
Anesthesia: General | Site: Uterus | Laterality: Bilateral

## 2022-02-23 MED ORDER — ONDANSETRON HCL 4 MG/2ML IJ SOLN: 4.0000 mg | Freq: Once | INTRAMUSCULAR | Status: DC | PRN

## 2022-02-23 MED ORDER — DEXAMETHASONE SODIUM PHOSPHATE 10 MG/ML IJ SOLN
INTRAMUSCULAR | Status: DC | PRN
  Administered 2022-02-23: 10 mg via INTRAVENOUS

## 2022-02-23 MED ORDER — ENOXAPARIN SODIUM 40 MG/0.4ML IJ SOSY
40.0000 mg | PREFILLED_SYRINGE | INTRAMUSCULAR | Status: DC
  Administered 2022-02-24: 40 mg via SUBCUTANEOUS
  Filled 2022-02-23: qty 0.4

## 2022-02-23 MED ORDER — ROCURONIUM BROMIDE 10 MG/ML (PF) SYRINGE
PREFILLED_SYRINGE | INTRAVENOUS | Status: AC
  Filled 2022-02-23: qty 10

## 2022-02-23 MED ORDER — OXYCODONE HCL 5 MG PO TABS: 5.0000 mg | ORAL_TABLET | Freq: Once | ORAL | Status: DC | PRN

## 2022-02-23 MED ORDER — MIDAZOLAM HCL 2 MG/2ML IJ SOLN
INTRAMUSCULAR | Status: DC | PRN
  Administered 2022-02-23: 2 mg via INTRAVENOUS

## 2022-02-23 MED ORDER — MIDAZOLAM HCL 2 MG/2ML IJ SOLN
INTRAMUSCULAR | Status: AC
  Filled 2022-02-23: qty 2

## 2022-02-23 MED ORDER — LIDOCAINE 2% (20 MG/ML) 5 ML SYRINGE
INTRAMUSCULAR | Status: AC
  Filled 2022-02-23: qty 5

## 2022-02-23 MED ORDER — ACETAMINOPHEN 325 MG PO TABS
650.0000 mg | ORAL_TABLET | Freq: Once | ORAL | Status: AC
  Filled 2022-02-23: qty 2

## 2022-02-23 MED ORDER — ACETAMINOPHEN 325 MG PO TABS
ORAL_TABLET | ORAL | Status: AC
  Administered 2022-02-23: 650 mg via ORAL
  Filled 2022-02-23: qty 2

## 2022-02-23 MED ORDER — SCOPOLAMINE 1 MG/3DAYS TD PT72: 1.0000 | MEDICATED_PATCH | TRANSDERMAL | Status: DC

## 2022-02-23 MED ORDER — SUGAMMADEX SODIUM 200 MG/2ML IV SOLN
INTRAVENOUS | Status: DC | PRN
  Administered 2022-02-23: 200 mg via INTRAVENOUS

## 2022-02-23 MED ORDER — CEFAZOLIN SODIUM-DEXTROSE 2-4 GM/100ML-% IV SOLN
2.0000 g | INTRAVENOUS | Status: AC
  Administered 2022-02-23: 2 g via INTRAVENOUS
  Filled 2022-02-23: qty 100

## 2022-02-23 MED ORDER — ACETAMINOPHEN 325 MG PO TABS
650.0000 mg | ORAL_TABLET | ORAL | Status: DC | PRN
  Administered 2022-02-23: 650 mg via ORAL
  Filled 2022-02-23: qty 2

## 2022-02-23 MED ORDER — MORPHINE SULFATE (PF) 4 MG/ML IV SOLN
INTRAVENOUS | Status: AC
  Administered 2022-02-23: 2 mg
  Filled 2022-02-23: qty 1

## 2022-02-23 MED ORDER — LIDOCAINE-EPINEPHRINE 1 %-1:100000 IJ SOLN
INTRAMUSCULAR | Status: AC
  Filled 2022-02-23: qty 1

## 2022-02-23 MED ORDER — DEXAMETHASONE SODIUM PHOSPHATE 10 MG/ML IJ SOLN
INTRAMUSCULAR | Status: AC
  Filled 2022-02-23: qty 1

## 2022-02-23 MED ORDER — IBUPROFEN 600 MG PO TABS
600.0000 mg | ORAL_TABLET | Freq: Four times a day (QID) | ORAL | Status: DC
  Administered 2022-02-23 – 2022-02-24 (×5): 600 mg via ORAL
  Filled 2022-02-23 (×5): qty 1

## 2022-02-23 MED ORDER — ONDANSETRON HCL 4 MG/2ML IJ SOLN
INTRAMUSCULAR | Status: DC | PRN
  Administered 2022-02-23: 4 mg via INTRAVENOUS

## 2022-02-23 MED ORDER — PROPOFOL 10 MG/ML IV BOLUS
INTRAVENOUS | Status: DC | PRN
  Administered 2022-02-23: 180 mg via INTRAVENOUS

## 2022-02-23 MED ORDER — ONDANSETRON HCL 4 MG PO TABS: 4.0000 mg | ORAL_TABLET | Freq: Four times a day (QID) | ORAL | Status: DC | PRN

## 2022-02-23 MED ORDER — SIMETHICONE 80 MG PO CHEW: 80.0000 mg | CHEWABLE_TABLET | Freq: Four times a day (QID) | ORAL | Status: DC | PRN

## 2022-02-23 MED ORDER — SCOPOLAMINE 1 MG/3DAYS TD PT72
MEDICATED_PATCH | TRANSDERMAL | Status: AC
  Administered 2022-02-23: 1.5 mg via TRANSDERMAL
  Filled 2022-02-23: qty 1

## 2022-02-23 MED ORDER — MAGNESIUM HYDROXIDE 400 MG/5ML PO SUSP: 30.0000 mL | Freq: Every day | ORAL | Status: DC | PRN

## 2022-02-23 MED ORDER — PHENYLEPHRINE 80 MCG/ML (10ML) SYRINGE FOR IV PUSH (FOR BLOOD PRESSURE SUPPORT)
PREFILLED_SYRINGE | INTRAVENOUS | Status: AC
  Filled 2022-02-23: qty 10

## 2022-02-23 MED ORDER — LACTATED RINGERS IV SOLN: INTRAVENOUS | Status: DC

## 2022-02-23 MED ORDER — LIDOCAINE 2% (20 MG/ML) 5 ML SYRINGE
INTRAMUSCULAR | Status: DC | PRN
  Administered 2022-02-23 (×2): 80 mg via INTRAVENOUS

## 2022-02-23 MED ORDER — POVIDONE-IODINE 10 % EX SWAB
2.0000 | Freq: Once | CUTANEOUS | Status: AC
  Administered 2022-02-23: 2 via TOPICAL

## 2022-02-23 MED ORDER — ORAL CARE MOUTH RINSE: 15.0000 mL | Freq: Once | OROMUCOSAL | Status: AC

## 2022-02-23 MED ORDER — OXYCODONE HCL 5 MG PO TABS
5.0000 mg | ORAL_TABLET | ORAL | Status: DC | PRN
  Administered 2022-02-23 – 2022-02-24 (×2): 10 mg via ORAL
  Filled 2022-02-23 (×3): qty 2

## 2022-02-23 MED ORDER — FENTANYL CITRATE (PF) 250 MCG/5ML IJ SOLN
INTRAMUSCULAR | Status: DC | PRN
  Administered 2022-02-23: 50 ug via INTRAVENOUS
  Administered 2022-02-23: 200 ug via INTRAVENOUS

## 2022-02-23 MED ORDER — SOD CITRATE-CITRIC ACID 500-334 MG/5ML PO SOLN
30.0000 mL | ORAL | Status: AC
  Administered 2022-02-23: 30 mL via ORAL
  Filled 2022-02-23: qty 30

## 2022-02-23 MED ORDER — MORPHINE SULFATE (PF) 2 MG/ML IV SOLN
1.0000 mg | INTRAVENOUS | Status: DC | PRN
  Administered 2022-02-23 (×3): 2 mg via INTRAVENOUS

## 2022-02-23 MED ORDER — MORPHINE SULFATE (PF) 2 MG/ML IV SOLN
INTRAVENOUS | Status: AC
  Filled 2022-02-23: qty 2

## 2022-02-23 MED ORDER — ONDANSETRON HCL 4 MG/2ML IJ SOLN
INTRAMUSCULAR | Status: AC
  Filled 2022-02-23: qty 2

## 2022-02-23 MED ORDER — OXYCODONE HCL 5 MG/5ML PO SOLN: 5.0000 mg | Freq: Once | ORAL | Status: DC | PRN

## 2022-02-23 MED ORDER — PHENYLEPHRINE 80 MCG/ML (10ML) SYRINGE FOR IV PUSH (FOR BLOOD PRESSURE SUPPORT)
PREFILLED_SYRINGE | INTRAVENOUS | Status: DC | PRN
  Administered 2022-02-23: 80 ug via INTRAVENOUS

## 2022-02-23 MED ORDER — KETOROLAC TROMETHAMINE 15 MG/ML IJ SOLN
15.0000 mg | INTRAMUSCULAR | Status: AC
  Administered 2022-02-23: 15 mg via INTRAVENOUS
  Filled 2022-02-23: qty 1

## 2022-02-23 MED ORDER — PHENYLEPHRINE HCL-NACL 20-0.9 MG/250ML-% IV SOLN
INTRAVENOUS | Status: DC | PRN
  Administered 2022-02-23: 20 ug/min via INTRAVENOUS

## 2022-02-23 MED ORDER — ROCURONIUM BROMIDE 10 MG/ML (PF) SYRINGE
PREFILLED_SYRINGE | INTRAVENOUS | Status: DC | PRN
  Administered 2022-02-23: 90 mg via INTRAVENOUS
  Administered 2022-02-23: 10 mg via INTRAVENOUS

## 2022-02-23 MED ORDER — 0.9 % SODIUM CHLORIDE (POUR BTL) OPTIME
TOPICAL | Status: DC | PRN
  Administered 2022-02-23: 1000 mL

## 2022-02-23 MED ORDER — CHLORHEXIDINE GLUCONATE 0.12 % MT SOLN
15.0000 mL | Freq: Once | OROMUCOSAL | Status: AC
  Administered 2022-02-23: 15 mL via OROMUCOSAL
  Filled 2022-02-23: qty 15

## 2022-02-23 MED ORDER — ONDANSETRON HCL 4 MG/2ML IJ SOLN
4.0000 mg | Freq: Four times a day (QID) | INTRAMUSCULAR | Status: DC | PRN
  Administered 2022-02-23: 4 mg via INTRAVENOUS
  Filled 2022-02-23: qty 2

## 2022-02-23 MED ORDER — PROPOFOL 10 MG/ML IV BOLUS
INTRAVENOUS | Status: AC
  Filled 2022-02-23: qty 20

## 2022-02-23 MED ORDER — DOCUSATE SODIUM 100 MG PO CAPS
100.0000 mg | ORAL_CAPSULE | Freq: Two times a day (BID) | ORAL | Status: DC
  Administered 2022-02-23 – 2022-02-24 (×3): 100 mg via ORAL
  Filled 2022-02-23 (×3): qty 1

## 2022-02-23 MED ORDER — FENTANYL CITRATE (PF) 250 MCG/5ML IJ SOLN
INTRAMUSCULAR | Status: AC
  Filled 2022-02-23: qty 5

## 2022-02-23 SURGICAL SUPPLY — 26 items
BAG COUNTER SPONGE SURGICOUNT (BAG) ×1 IMPLANT
BLADE SURG 10 STRL SS (BLADE) ×1 IMPLANT
GAUZE 4X4 16PLY ~~LOC~~+RFID DBL (SPONGE) ×2 IMPLANT
GLOVE BIO SURGEON STRL SZ7.5 (GLOVE) ×1 IMPLANT
GLOVE BIOGEL PI IND STRL 7.0 (GLOVE) ×1 IMPLANT
GLOVE BIOGEL PI IND STRL 8 (GLOVE) ×1 IMPLANT
GLOVE SURG ORTHO 8.0 STRL STRW (GLOVE) ×1 IMPLANT
GOWN STRL REUS W/ TWL LRG LVL3 (GOWN DISPOSABLE) ×1 IMPLANT
GOWN STRL REUS W/ TWL XL LVL3 (GOWN DISPOSABLE) ×2 IMPLANT
GOWN STRL REUS W/TWL LRG LVL3 (GOWN DISPOSABLE) ×1
GOWN STRL REUS W/TWL XL LVL3 (GOWN DISPOSABLE) ×2
HIBICLENS CHG 4% 4OZ BTL (MISCELLANEOUS) ×1 IMPLANT
MANIFOLD NEPTUNE II (INSTRUMENTS) ×1 IMPLANT
NS IRRIG 1000ML POUR BTL (IV SOLUTION) ×1 IMPLANT
PACK VAGINAL WOMENS (CUSTOM PROCEDURE TRAY) ×1 IMPLANT
PAD OB MATERNITY 4.3X12.25 (PERSONAL CARE ITEMS) ×1 IMPLANT
SUT VIC AB 2-0 CT1 18 (SUTURE) ×1 IMPLANT
SUT VIC AB 2-0 CT1 27 (SUTURE) ×1
SUT VIC AB 2-0 CT1 TAPERPNT 27 (SUTURE) ×1 IMPLANT
SUT VIC AB 3-0 SH 27 (SUTURE) ×2
SUT VIC AB 3-0 SH 27X BRD (SUTURE) IMPLANT
SUT VIC AB PLUS 45CM 1-MO-4 (SUTURE) ×2 IMPLANT
SUT VICRYL 1 TIES 12X18 (SUTURE) ×1 IMPLANT
TOWEL GREEN STERILE FF (TOWEL DISPOSABLE) ×2 IMPLANT
TRAY FOLEY W/BAG SLVR 14FR LF (SET/KITS/TRAYS/PACK) ×1 IMPLANT
UNDERPAD 30X36 HEAVY ABSORB (UNDERPADS AND DIAPERS) ×1 IMPLANT

## 2022-02-23 NOTE — Anesthesia Procedure Notes (Signed)
Procedure Name: Intubation Date/Time: 02/23/2022 9:32 AM  Performed by: Lorie Phenix, CRNAPre-anesthesia Checklist: Patient identified, Emergency Drugs available, Suction available and Patient being monitored Patient Re-evaluated:Patient Re-evaluated prior to induction Oxygen Delivery Method: Circle system utilized Preoxygenation: Pre-oxygenation with 100% oxygen Induction Type: IV induction Ventilation: Mask ventilation without difficulty Laryngoscope Size: Mac and 3 Grade View: Grade I Tube type: Oral Tube size: 7.0 mm Number of attempts: 1 Airway Equipment and Method: Stylet Placement Confirmation: ETT inserted through vocal cords under direct vision, positive ETCO2 and breath sounds checked- equal and bilateral Secured at: 22 cm Tube secured with: Tape Dental Injury: Teeth and Oropharynx as per pre-operative assessment

## 2022-02-23 NOTE — Anesthesia Postprocedure Evaluation (Signed)
Anesthesia Post Note  Patient: Victoria Rodriguez  Procedure(s) Performed: VAGINAL HYSTERECTOMY VAGINAL WITH BILATEREAL SALPINGECTOMY (Bilateral: Uterus)     Patient location during evaluation: PACU Anesthesia Type: General Level of consciousness: awake and alert and oriented Pain management: pain level controlled Vital Signs Assessment: post-procedure vital signs reviewed and stable Respiratory status: spontaneous breathing, nonlabored ventilation and respiratory function stable Cardiovascular status: blood pressure returned to baseline and stable Postop Assessment: no apparent nausea or vomiting Anesthetic complications: no   No notable events documented.  Last Vitals:  Vitals:   02/23/22 1240 02/23/22 1245  BP:  117/65  Pulse: 82 79  Resp: 10 13  Temp:    SpO2: 96% 96%    Last Pain:  Vitals:   02/23/22 1230  TempSrc:   PainSc: Asleep                 Lesli Issa A.

## 2022-02-23 NOTE — Transfer of Care (Signed)
Immediate Anesthesia Transfer of Care Note  Patient: Victoria Rodriguez  Procedure(s) Performed: VAGINAL HYSTERECTOMY VAGINAL WITH BILATEREAL SALPINGECTOMY (Bilateral: Uterus)  Patient Location: PACU  Anesthesia Type:General  Level of Consciousness: drowsy  Airway & Oxygen Therapy: Patient Spontanous Breathing and Patient connected to face mask oxygen  Post-op Assessment: Report given to RN and Post -op Vital signs reviewed and stable  Post vital signs: Reviewed and stable  Last Vitals:  Vitals Value Taken Time  BP 120/74 02/23/22 1223  Temp    Pulse 87 02/23/22 1224  Resp 14 02/23/22 1224  SpO2 100 % 02/23/22 1224  Vitals shown include unvalidated device data.  Last Pain:  Vitals:   02/23/22 0841  TempSrc:   PainSc: 5       Patients Stated Pain Goal: 0 (28/24/17 5301)  Complications: No notable events documented.

## 2022-02-23 NOTE — Interval H&P Note (Signed)
History and Physical Interval Note:  02/23/2022 8:50 AM  Victoria Rodriguez  has presented today for surgery, with the diagnosis of FIBROIDS.  The various methods of treatment have been discussed with the patient and family. After consideration of risks, benefits and other options for treatment, the patient has consented to  Procedure(s): VAGINAL HYSTERECTOMY VAGINAL WITH BILATEREAL SALPINGECTOMY (Bilateral) as a surgical intervention.  The patient's history has been reviewed, patient examined, no change in status, stable for surgery.  I have reviewed the patient's chart and labs.  Questions were answered to the patient's satisfaction.     Griffin Basil

## 2022-02-23 NOTE — Op Note (Addendum)
[name] PROCEDURE DATE: 02/23/2022  PREOPERATIVE DIAGNOSIS:  Symptomatic fibroids, abnormal uterine bleeding POSTOPERATIVE DIAGNOSIS:  Symptomatic fibroids,abnormal uterine bleeding SURGEON:   Lynnda Shields M.D., FACOG ASSISTANT: Arlina Robes, M.D. OPERATION:  Total Vaginal hysterectomy , bilateral salpingectomy ANESTHESIA:  General endotracheal.   An experienced assistant was required given the standard of surgical care given the complexity of the case.  This assistant was needed for exposure, dissection, suctioning, retraction, instrument exchange,and for overall help during the procedure.  INDICATIONS: The patient is a 41 y.o. [id number] with history of symptomatic uterine fibroids and abnormal uterine bleeding. The patient made a decision to undergo definite surgical treatment. On the preoperative visit, the risks, benefits, indications, and alternatives of the procedure were reviewed with the patient.  On the day of surgery, the risks of surgery were again discussed with the patient including but not limited to: bleeding which may require transfusion or reoperation; infection which may require antibiotics; injury to bowel, bladder, ureters or other surrounding organs; need for additional procedures; thromboembolic phenomenon, incisional problems and other postoperative/anesthesia complications. Written informed consent was obtained.    OPERATIVE FINDINGS: A 12 week size uterus with normal tubes and ovaries bilaterally.  ESTIMATED BLOOD LOSS: 450 ml FLUIDS:  1200 ml of Lactated Ringers URINE OUTPUT:  100 ml of clear yellow urine. SPECIMENS:  Uterus, bilateral fallopian tubes and cervix sent to pathology COMPLICATIONS:  None immediate.  DESCRIPTION OF PROCEDURE:  The patient received intravenous antibiotics and had sequential compression devices applied to her lower extremities while in the preoperative area.  She was then taken to the operating room where general anesthesia was  administered and was found to be adequate.  She was placed in the dorsal lithotomy position, and was prepped and draped in a sterile manner.  The patients bladder was drained with a foley catheter and left in place. After an adequate timeout was performed, attention was turned to her pelvis.  A weighted speculum was then placed in the vagina, and the anterior and posterior lips of the cervix were grasped bilaterally with tenaculums.    The posterior cul-de-sac was entered sharply without difficulty. A suture was placed on the posterior vagina.  A long weighted speculum was inserted into the posterior cul-de-sac. The cervix was then circumferentially incised, and the bladder was dissected off the pubocervical fascia with some difficulty due to scarring from a previous cesarean section.  The Heaney clamp was then used to clamp the uterosacral ligaments on either side.  They were then cut and sutured ligated with 0 Vicryl, and the ligated uterosacral ligaments were transfixed to the posterior lateral vaginal epithelium to further support the vagina and provide hemostasis. Of note, all sutures used in this case were 0 Vicryl unless otherwise noted.   The cardinal ligaments were then clamped, cut and ligated. The anterior cul-de-sac was then entered sharpely. The uterine vessels and broad ligaments were then serially clamped with the Heaney clamps, cut, and suture ligated on both sides.  Excellent hemostasis was noted at this point.  Due to the size of the uterus, it was morcellated using a coring technique.  The uterus was then delivered via the posterior cul-de-sac, and the cornua were clamped with the Heaney clamps, transected, and the uterus was delivered and sent to pathology. Weight in the OR was 450 grams.  These pedicles were then suture ligated to ensure hemostasis.  After completion of the hysterectomy, The fallopian tube on the left side was grasped with a Kelly clamp, transected  and suture ligated.  This  procedure was repeated with the right fallopian tube. All pedicles from the uterosacral ligament to the cornua were examined hemostasis was confirmed.  The vaginal cuff was reefed in a running locked fashion with 2-0 vicryl and  then reapproximated using figure of eight sutures again with 2-0 vicryl.  Care was given to incorporate the uterosacral pedicles bilaterally.  All instruments were then removed from the pelvis.  The patient tolerated the procedure well.  All instruments, needles, and sponge counts were correct x 2. The patient was taken to the recovery room in stable condition.       Lynnda Shields, MD Faculty attending, [facility]

## 2022-02-23 NOTE — Anesthesia Preprocedure Evaluation (Addendum)
Anesthesia Evaluation  Patient identified by MRN, date of birth, ID band Patient awake    Reviewed: Allergy & Precautions, NPO status , Patient's Chart, lab work & pertinent test results  Airway Mallampati: II  TM Distance: >3 FB Neck ROM: Full    Dental no notable dental hx. (+) Teeth Intact   Pulmonary neg pulmonary ROS,    Pulmonary exam normal breath sounds clear to auscultation       Cardiovascular negative cardio ROS Normal cardiovascular exam Rhythm:Regular Rate:Normal     Neuro/Psych  Headaches, negative psych ROS   GI/Hepatic negative GI ROS, Neg liver ROS,   Endo/Other  Obesity  Renal/GU negative Renal ROS  negative genitourinary   Musculoskeletal negative musculoskeletal ROS (+)   Abdominal (+) + obese,   Peds  Hematology  (+) Blood dyscrasia, anemia ,   Anesthesia Other Findings   Reproductive/Obstetrics AUB  Uterine fibroids                             Anesthesia Physical Anesthesia Plan  ASA: 2  Anesthesia Plan: General   Post-op Pain Management: Minimal or no pain anticipated, Precedex, Ketamine IV*, Dilaudid IV and Tylenol PO (pre-op)*   Induction:   PONV Risk Score and Plan: 4 or greater and Treatment may vary due to age or medical condition, Ondansetron, Dexamethasone, Scopolamine patch - Pre-op and Midazolam  Airway Management Planned: Oral ETT  Additional Equipment: None  Intra-op Plan:   Post-operative Plan: Extubation in OR  Informed Consent: I have reviewed the patients History and Physical, chart, labs and discussed the procedure including the risks, benefits and alternatives for the proposed anesthesia with the patient or authorized representative who has indicated his/her understanding and acceptance.     Dental advisory given  Plan Discussed with: CRNA and Anesthesiologist  Anesthesia Plan Comments:        Anesthesia Quick Evaluation

## 2022-02-24 ENCOUNTER — Other Ambulatory Visit (HOSPITAL_COMMUNITY): Payer: Self-pay

## 2022-02-24 ENCOUNTER — Encounter (HOSPITAL_COMMUNITY): Payer: Self-pay | Admitting: Obstetrics and Gynecology

## 2022-02-24 DIAGNOSIS — Z98891 History of uterine scar from previous surgery: Secondary | ICD-10-CM | POA: Diagnosis not present

## 2022-02-24 DIAGNOSIS — D259 Leiomyoma of uterus, unspecified: Secondary | ICD-10-CM | POA: Diagnosis not present

## 2022-02-24 LAB — CBC
HCT: 27.8 % — ABNORMAL LOW (ref 36.0–46.0)
Hemoglobin: 8.9 g/dL — ABNORMAL LOW (ref 12.0–15.0)
MCH: 25.1 pg — ABNORMAL LOW (ref 26.0–34.0)
MCHC: 32 g/dL (ref 30.0–36.0)
MCV: 78.5 fL — ABNORMAL LOW (ref 80.0–100.0)
Platelets: 284 10*3/uL (ref 150–400)
RBC: 3.54 MIL/uL — ABNORMAL LOW (ref 3.87–5.11)
RDW: 20.4 % — ABNORMAL HIGH (ref 11.5–15.5)
WBC: 7.3 10*3/uL (ref 4.0–10.5)
nRBC: 0 % (ref 0.0–0.2)

## 2022-02-24 LAB — SURGICAL PATHOLOGY

## 2022-02-24 MED ORDER — SIMETHICONE 80 MG PO CHEW
80.0000 mg | CHEWABLE_TABLET | Freq: Four times a day (QID) | ORAL | 0 refills | Status: DC | PRN
  Filled 2022-02-24: qty 30, 8d supply, fill #0

## 2022-02-24 MED ORDER — ONDANSETRON HCL 4 MG PO TABS
4.0000 mg | ORAL_TABLET | Freq: Four times a day (QID) | ORAL | 0 refills | Status: DC | PRN
  Filled 2022-02-24: qty 20, 5d supply, fill #0

## 2022-02-24 MED ORDER — OXYCODONE HCL 5 MG PO TABS
5.0000 mg | ORAL_TABLET | ORAL | 0 refills | Status: DC | PRN
  Filled 2022-02-24: qty 30, 5d supply, fill #0

## 2022-02-24 MED ORDER — IBUPROFEN 600 MG PO TABS
600.0000 mg | ORAL_TABLET | Freq: Four times a day (QID) | ORAL | 2 refills | Status: DC | PRN
  Filled 2022-02-24: qty 30, 8d supply, fill #0

## 2022-02-24 MED ORDER — FLUCONAZOLE 150 MG PO TABS
150.0000 mg | ORAL_TABLET | Freq: Every day | ORAL | 0 refills | Status: DC
  Filled 2022-02-24: qty 2, 2d supply, fill #0

## 2022-02-24 NOTE — Discharge Summary (Signed)
Physician Discharge Summary  Patient ID: Victoria Rodriguez MRN: [id number] DOB/AGE: [birth year] 41 y.o.  Admit date: 02/23/2022 Discharge date: 02/24/2022  Admission Diagnoses: AUB  Discharge Diagnoses:  Principal Problem:   S/P vaginal hysterectomy Active Problems:   Fibroids   Discharged Condition: good  Hospital Course: Pt was admitted for total vaginal hysterectomy 2/2 fibroid uterus and abnormal uterine bleeding.  Please see separate op note for details.  Pt was seen the morning following surgery. Her foley catheter had been removed and she had voided successfully.  Pain was well controlled with oral medication  and she was tolerating regular diet with mild nausea.  Pt felt comfortable with afternoon discharge later in the day.  Consults: None  Significant Diagnostic Studies: labs: cbc  Treatments: surgery: total vaginal hysterectomy with bilateral salpingectomy  Discharge Exam: Blood pressure 115/62, pulse 66, temperature 98.4 F (36.9 C), temperature source Oral, resp. rate 17, height '5\' 3"'$  (1.6 m), weight 88.5 kg, last menstrual period 01/25/2022, SpO2 99 %. General appearance: alert, cooperative, and no distress Head: Normocephalic, without obvious abnormality, atraumatic Resp: clear to auscultation bilaterally Cardio: regular rate and rhythm Extremities: Homans sign is negative, no sign of DVT and no edema, redness or tenderness in the calves or thighs Abdominal:  appropriately tender, nondistended, positive bowel sounds Disposition: Discharge disposition: 01-Home or Self Care       Discharge Instructions     Call MD for:  difficulty breathing, headache or visual disturbances   Complete by: As directed    Call MD for:  persistant dizziness or light-headedness   Complete by: As directed    Call MD for:  persistant nausea and vomiting   Complete by: As directed    Call MD for:  severe uncontrolled pain   Complete by: As directed    Call MD for:  temperature  >100.4   Complete by: As directed    Diet - low sodium heart healthy   Complete by: As directed    Driving Restrictions   Complete by: As directed    No driving or heavy lifting for 7-10 days or while still taking narcotic pain medication   Increase activity slowly   Complete by: As directed    Sexual Activity Restrictions   Complete by: As directed    Pelvic rest 6-8 weeks      Allergies as of 02/24/2022       Reactions   Hydrocodone-acetaminophen Nausea Only   Vicodin [hydrocodone-acetaminophen] Nausea Only        Medication List     STOP taking these medications    acetaminophen 650 MG CR tablet Commonly known as: TYLENOL   polyethylene glycol powder 17 GM/SCOOP powder Commonly known as: GLYCOLAX/MIRALAX       TAKE these medications    ferrous gluconate 324 MG tablet Commonly known as: FERGON Take 1 tablet (324 mg total) by mouth daily with breakfast.   fluconazole 150 MG tablet Commonly known as: Diflucan Take 1 tablet (150 mg total) by mouth daily.   ibuprofen 600 MG tablet Commonly known as: ADVIL Take 1 tablet (600 mg total) by mouth every 6 (six) hours as needed for moderate pain. What changed:  when to take this reasons to take this   ondansetron 4 MG tablet Commonly known as: ZOFRAN Take 1 tablet (4 mg total) by mouth every 6 (six) hours as needed for nausea.   oxyCODONE 5 MG immediate release tablet Commonly known as: Oxy IR/ROXICODONE Take 1-2 tablets (5-10 mg total)  by mouth every 4 (four) hours as needed for moderate pain.   V-R GAS RELIEF 80 MG chewable tablet Generic drug: simethicone Chew 1 tablet (80 mg total) by mouth 4 (four) times daily as needed for flatulence.        Follow-up Guy for [facility]. Schedule an appointment as soon as possible for a visit in 1 month(s).   Specialty: Obstetrics and Gynecology Why: post op with Victoria Rodriguez in 1 month Contact  information: [place] [address] [phone]                Signed: Griffin Basil 02/24/2022, 2:05 PM

## 2022-02-24 NOTE — Care Management Obs Status (Cosign Needed)
[place] NOTIFICATION   Patient Details  Name: Victoria Rodriguez MRN: [id number] Date of Birth: 1980/08/15   Medicare Observation Status Notification Given:  Yes    Yong Channel, RN 02/24/2022, 10:45 AM

## 2022-02-24 NOTE — Care Management CC44 (Cosign Needed)
Condition Code 44 Documentation Completed  Patient Details  Name: Victoria Rodriguez MRN: [id number] Date of Birth: [birth year]   Condition Code 44 given:  Yes Patient signature on Condition Code 44 notice:  Yes Documentation of 2 MD's agreement:  Yes Code 44 added to claim:  Yes    Yong Channel, RN 02/24/2022, 10:46 AM

## 2022-02-24 NOTE — Progress Notes (Signed)
Discharge teaching complete  pt ambulated in hall and has eaten well expelling flatus  and is ready to go home  family at bedside

## 2022-03-02 ENCOUNTER — Encounter: Payer: Self-pay | Admitting: Obstetrics and Gynecology

## 2022-03-03 ENCOUNTER — Encounter: Payer: Self-pay | Admitting: *Deleted

## 2022-03-03 ENCOUNTER — Emergency Department (EMERGENCY_DEPARTMENT_HOSPITAL)
Admit: 2022-03-03 | Discharge: 2022-03-03 | Disposition: A | Discharge status: Home / Self Care | Payer: Medicare Other | Attending: Emergency Medicine | Admitting: Emergency Medicine

## 2022-03-03 ENCOUNTER — Emergency Department (HOSPITAL_COMMUNITY): Payer: Medicare Other

## 2022-03-03 ENCOUNTER — Encounter (HOSPITAL_COMMUNITY): Payer: Self-pay | Admitting: Emergency Medicine

## 2022-03-03 ENCOUNTER — Emergency Department (HOSPITAL_COMMUNITY)
Admission: EM | Admit: 2022-03-03 | Discharge: 2022-03-04 | Discharge status: Against Medical Advice | Payer: Medicare Other | Attending: Emergency Medicine | Admitting: Emergency Medicine

## 2022-03-03 DIAGNOSIS — R2240 Localized swelling, mass and lump, unspecified lower limb: Secondary | ICD-10-CM | POA: Diagnosis not present

## 2022-03-03 DIAGNOSIS — R079 Chest pain, unspecified: Secondary | ICD-10-CM | POA: Insufficient documentation

## 2022-03-03 DIAGNOSIS — R0602 Shortness of breath: Secondary | ICD-10-CM | POA: Diagnosis not present

## 2022-03-03 DIAGNOSIS — R29898 Other symptoms and signs involving the musculoskeletal system: Secondary | ICD-10-CM

## 2022-03-03 DIAGNOSIS — R0789 Other chest pain: Secondary | ICD-10-CM | POA: Diagnosis not present

## 2022-03-03 DIAGNOSIS — Z5321 Procedure and treatment not carried out due to patient leaving prior to being seen by health care provider: Secondary | ICD-10-CM | POA: Diagnosis not present

## 2022-03-03 DIAGNOSIS — M79661 Pain in right lower leg: Secondary | ICD-10-CM | POA: Diagnosis not present

## 2022-03-03 DIAGNOSIS — Z9889 Other specified postprocedural states: Secondary | ICD-10-CM | POA: Diagnosis not present

## 2022-03-03 LAB — BASIC METABOLIC PANEL
Anion gap: 9 (ref 5–15)
BUN: 8 mg/dL (ref 6–20)
CO2: 25 mmol/L (ref 22–32)
Calcium: 9.3 mg/dL (ref 8.9–10.3)
Chloride: 103 mmol/L (ref 98–111)
Creatinine, Ser: 0.52 mg/dL (ref 0.44–1.00)
GFR, Estimated: >60 mL/min (ref >60)
Glucose, Bld: 96 mg/dL (ref 70–99)
Potassium: 3.7 mmol/L (ref 3.5–5.1)
Sodium: 137 mmol/L (ref 135–145)

## 2022-03-03 LAB — CBC WITH DIFFERENTIAL/PLATELET
Abs Immature Granulocytes: 0.02 10*3/uL (ref 0.00–0.07)
Basophils Absolute: 0 10*3/uL (ref 0.0–0.1)
Basophils Relative: 0 %
Eosinophils Absolute: 0.1 10*3/uL (ref 0.0–0.5)
Eosinophils Relative: 3 %
HCT: 35 % — ABNORMAL LOW (ref 36.0–46.0)
Hemoglobin: 10.8 g/dL — ABNORMAL LOW (ref 12.0–15.0)
Immature Granulocytes: 0 %
Lymphocytes Relative: 28 %
Lymphs Abs: 1.3 10*3/uL (ref 0.7–4.0)
MCH: 25.1 pg — ABNORMAL LOW (ref 26.0–34.0)
MCHC: 30.9 g/dL (ref 30.0–36.0)
MCV: 81.4 fL (ref 80.0–100.0)
Monocytes Absolute: 0.3 10*3/uL (ref 0.1–1.0)
Monocytes Relative: 7 %
Neutro Abs: 2.8 10*3/uL (ref 1.7–7.7)
Neutrophils Relative %: 62 %
Platelets: 373 10*3/uL (ref 150–400)
RBC: 4.3 MIL/uL (ref 3.87–5.11)
RDW: 19 % — ABNORMAL HIGH (ref 11.5–15.5)
WBC: 4.5 10*3/uL (ref 4.0–10.5)
nRBC: 0 % (ref 0.0–0.2)

## 2022-03-03 LAB — TROPONIN I (HIGH SENSITIVITY): Troponin I (High Sensitivity): 3 ng/L (ref <18)

## 2022-03-03 NOTE — ED Triage Notes (Signed)
Patient here with complaint of chest pain and right lower leg pain that started Thursday, recent hysterectomy. Denies shortness of breath, denies calf tenderness, patient is alert, oriented, and in no apparent distress.

## 2022-03-03 NOTE — ED Notes (Signed)
NA x3 vitals 

## 2022-03-03 NOTE — ED Notes (Signed)
Called pt's name several times over the past hour with no response

## 2022-03-03 NOTE — Progress Notes (Signed)
Right lower extremity venous duplex completed. Refer to "CV Proc" under chart review to view preliminary results.  03/03/2022 2:34 PM Kelby Aline., MHA, RVT, RDCS, RDMS

## 2022-03-03 NOTE — ED Provider Triage Note (Signed)
Emergency Medicine Provider Triage Evaluation Note  Victoria Rodriguez , a 41 y.o. female  was evaluated in triage.  Pt complains of right leg heaviness.  States she recently had hysterectomy on Tuesday.  Symptoms started 2 days after that.  Feels like right leg is heavy and feels like there is pressure.  She denies any overt pain.  She feels like her right calf may be more swollen than normal.  Denies any overt calf pain.  No back pain.  No abdominal pain.  No abnormal vaginal discharge or significant bleeding since surgery.  She also notes she has had some intermittent chest pain.  Nonexertional nonpleuritic in nature.  She has no current pain or shortness of breath.  Review of Systems  Positive: Leg heaviness, intermittent CP Negative:   Physical Exam  BP (!) 140/73 (BP Location: Right Arm)   Pulse 87   Temp 98.9 F (37.2 C) (Oral)   Resp 15   SpO2 99%  Gen:   Awake, no distress   Resp:  Normal effort  MSK:   Moves extremities without difficulty  Other:    Medical Decision Making  Medically screening exam initiated at 1:40 PM.  Appropriate orders placed.  [name] was informed that the remainder of the evaluation will be completed by another provider, this initial triage assessment does not replace that evaluation, and the importance of remaining in the ED until their evaluation is complete.  Right leg swelling, heaviness, intermittent CP   Mauri Tolen A, PA-C 03/03/22 1341

## 2022-03-04 ENCOUNTER — Telehealth: Payer: Self-pay | Admitting: Family Medicine

## 2022-03-05 ENCOUNTER — Telehealth: Payer: Self-pay | Admitting: Family Medicine

## 2022-03-05 NOTE — Telephone Encounter (Signed)
Patient had surgery with Dr.Bass last week she is having some issues and requesting to speak with a nurse.

## 2022-03-05 NOTE — Telephone Encounter (Signed)
Pt called and states she was evaluated for leg pain in ED; doppler study negative. Recommended patient follow up with PCP or urgent care.

## 2022-03-10 ENCOUNTER — Ambulatory Visit: Payer: Medicare Other | Admitting: Dermatology

## 2022-03-13 ENCOUNTER — Encounter: Payer: Self-pay | Admitting: Obstetrics and Gynecology

## 2022-03-19 NOTE — Telephone Encounter (Signed)
Open in error

## 2022-03-29 ENCOUNTER — Ambulatory Visit (INDEPENDENT_AMBULATORY_CARE_PROVIDER_SITE_OTHER): Payer: Medicare Other | Admitting: Family Medicine

## 2022-03-29 ENCOUNTER — Encounter: Payer: Self-pay | Admitting: Family Medicine

## 2022-03-29 VITALS — BP 112/80 | HR 78 | Temp 98.7°F | Ht 63.0 in | Wt 192.8 lb

## 2022-03-29 DIAGNOSIS — L732 Hidradenitis suppurativa: Secondary | ICD-10-CM | POA: Diagnosis not present

## 2022-03-29 DIAGNOSIS — Z23 Encounter for immunization: Secondary | ICD-10-CM

## 2022-03-29 MED ORDER — DOXYCYCLINE HYCLATE 100 MG PO TABS: 100.0000 mg | ORAL_TABLET | Freq: Two times a day (BID) | ORAL | 0 refills | Status: AC

## 2022-03-29 MED ORDER — CHLORHEXIDINE GLUCONATE 4 % EX LIQD: Freq: Every day | CUTANEOUS | 0 refills | Status: DC | PRN

## 2022-03-29 NOTE — Progress Notes (Signed)
    SUBJECTIVE:   CHIEF COMPLAINT: HS flare HPI:   Victoria Rodriguez is a 41 y.o.  with history notable for hydradenitis suppurativa presenting for spots in perineum. Patient reports 1 week ago notice 'boils' in R inguinal area. Has not tried anything for this. Had a vaginal hysterectomy ~4 weeks ago. Has not had intercourse since August. Has just 1 usual partner. No new soaps or triggers.  She reports otherwise she is well. She not back to work just yet from hysterectomy but feels she is doing well.    PERTINENT  PMH / PSH/Family/Social History : Known HS   OBJECTIVE:   BP 112/80   Pulse 78   Temp 98.7 F (37.1 C)   Ht '5\' 3"'$  (1.6 m)   Wt 192 lb 12.8 oz (87.5 kg)   LMP 01/25/2022   SpO2 100%   BMI 34.15 kg/m   Today's weight:  Last Weight  Most recent update: 03/29/2022 12:42 PM    Weight  87.5 kg (192 lb 12.8 oz)            Review of prior weights: Filed Weights   03/29/22 1240  Weight: 192 lb 12.8 oz (87.5 kg)    Cardiac: Regular rate and rhythm. Normal S1/S2. No murmurs, rubs, or gallops appreciated. Lungs: Clear bilaterally to ascultation.  Skin Chaperoned by CMA Simpson L infrainguinal area with multiple scars and healing abscesses R infrainguinal area with draining small scarring abscess 1.5 cm in diameter   ASSESSMENT/PLAN:   Hidradenitis Has upcoming Dermatology visit in months Rx Doxycycline for 7 days Hibiclens Discussed avoiding triggers   HCM Flu shot given     Dorris Singh, MD  [place]

## 2022-03-29 NOTE — Assessment & Plan Note (Signed)
Has upcoming Dermatology visit in months Rx Doxycycline for 7 days Hibiclens Discussed avoiding triggers

## 2022-03-29 NOTE — Patient Instructions (Signed)
It was wonderful to see you today.  Please bring ALL of your medications with you to every visit.   Today we talked about:  --Using Hibiclens when you shower--you can use on the affected area -Starting Doxycycline to control the HS - Going to see the dermatologist    Please follow up in 3-6  months   Thank you for choosing [facility].   Please call [phone] with any questions about today's appointment.  Please be sure to schedule follow up at the front  desk before you leave today.   Dorris Singh, MD  Family Medicine

## 2022-04-02 ENCOUNTER — Encounter: Payer: Self-pay | Admitting: Family Medicine

## 2022-04-02 ENCOUNTER — Encounter: Payer: Self-pay | Admitting: Obstetrics and Gynecology

## 2022-04-02 ENCOUNTER — Ambulatory Visit (INDEPENDENT_AMBULATORY_CARE_PROVIDER_SITE_OTHER): Payer: Medicare Other | Admitting: Obstetrics and Gynecology

## 2022-04-02 ENCOUNTER — Other Ambulatory Visit: Payer: Self-pay

## 2022-04-02 VITALS — BP 121/73 | HR 74 | Ht 63.0 in | Wt 193.2 lb

## 2022-04-02 DIAGNOSIS — Z4889 Encounter for other specified surgical aftercare: Secondary | ICD-10-CM

## 2022-04-02 NOTE — Progress Notes (Signed)
    Subjective:    Victoria Rodriguez is a 41 y.o. female who presents to the clinic status post TVH on 02/23/22. The patient is not having any pain.  Eating a regular diet without difficulty. Bowel movements are normal. No other significant postoperative concerns.  The following portions of the patient's history were reviewed and updated as appropriate: allergies, current medications, past family history, past medical history, past social history, past surgical history, and problem list..  Last pap smear was normal on 09/24/19.  Review of Systems Pertinent items are noted in HPI.   Objective:   BP 121/73   Pulse 74   Ht '5\' 3"'$  (1.6 m)   Wt 193 lb 3.2 oz (87.6 kg)   LMP 01/25/2022   BMI 34.22 kg/m  Constitutional:  Well-developed, well-nourished female in no acute distress.   Skin: Skin is warm and dry, no rash noted, not diaphoretic,no erythema, no pallor.  Cardiovascular: Normal heart rate noted  Respiratory: Effort and breath sounds normal, no problems with respiration noted  Abdomen: Soft, bowel sounds active, non-tender, no abnormal masses  Incision: N/a  Pelvic:    Vaginal cuff intact, no granulation tissue noted    MSK: full motor and sensory in lower extremities noted bilaterally Surgical pathology (uterus, cervix, tubes) Scattered small fibroids, benign cervix, adenomyosis Assessment:   Doing well postoperatively.  Operative findings again reviewed. Pathology report discussed.   Plan:   1. Continue any current medications. 2. Wound care discussed. 3. Activity restrictions: none 4. Anticipated return to work: 1-2 weeks and work Quarry manager provided. 5. Follow up as needed 6.  Routine preventative health maintenance measures emphasized. Please refer to After Visit Summary for other counseling recommendations.    Lynnda Shields, MD, [place] Attending [place] for [facility], [place]

## 2022-04-13 ENCOUNTER — Encounter: Payer: Self-pay | Admitting: Family Medicine

## 2022-05-10 ENCOUNTER — Ambulatory Visit (INDEPENDENT_AMBULATORY_CARE_PROVIDER_SITE_OTHER): Payer: Medicare Other | Admitting: Family Medicine

## 2022-05-10 VITALS — BP 110/68 | HR 79 | Ht 63.0 in | Wt 192.0 lb

## 2022-05-10 DIAGNOSIS — J029 Acute pharyngitis, unspecified: Secondary | ICD-10-CM | POA: Diagnosis not present

## 2022-05-10 LAB — POCT INFLUENZA A/B
Influenza A, POC: NEGATIVE
Influenza B, POC: NEGATIVE

## 2022-05-10 LAB — POCT RAPID STREP A (OFFICE): Rapid Strep A Screen: NEGATIVE

## 2022-05-10 NOTE — Patient Instructions (Addendum)
It was wonderful to see you today.  Please bring ALL of your medications with you to every visit.   Today we talked about:  You tested negative for Flu and Strep.  This is most likely a viral infection. This will take time to get over. The treatment for this is supportive care. You can alternate Tylenol and Ibuprofen for pain or fever every 3 hours (there should be 6 hours in between each dose of Tylenol, and 6 hours in between doses of Ibuprofen). You can take a teaspoon of honey by itself or mixed with water to help with the cough. Steam baths, Vicks vapor rub, a humidifier and nasal saline spray can help with congestion.   It is important to keep hydrated throughout this time!  Frequent hand washing to prevent recurrent illnesses is important.   Please return for recurrent symptoms that are not improving in 1-2 weeks, unable to keep fluids down, or any concerning symptoms to you.    Thank you for coming to your visit as scheduled. We have had a large "no-show" problem lately, and this significantly limits our ability to see and care for patients. As a friendly reminder- if you cannot make your appointment please call to cancel. We do have a no show policy for those who do not cancel within 24 hours. Our policy is that if you miss or fail to cancel an appointment within 24 hours, 3 times in a 54-monthperiod, you may be dismissed from our clinic.   Thank you for choosing [facility]   Please call [phone] any questions about today's appointment.  Please be sure to schedule follow up at the front  desk before you leave today.   ASharion Settler DO PGY-3 Family Medicine

## 2022-05-10 NOTE — Progress Notes (Signed)
    SUBJECTIVE:   CHIEF COMPLAINT / HPI:   CYLA HALUSKA is a 41 y.o. female who presents to the [facility] clinic today to discuss the following concerns:   Sick Symptoms Daughter was recently diagnosed with the Flu on 12/2. Patients symptoms started on Thursday 12/7 with sore throat. She then started to have intermittent chills and non-productive cough. No runny nose, congestion, or fever. She has been taking NyQuil and Theraflu. She has also been using sore throat spray.   She works in [employer] [place].   PERTINENT  PMH / PSH: Hidradenitis   OBJECTIVE:   BP 110/68   Pulse 79   Ht '5\' 3"'$  (1.6 m)   Wt 192 lb (87.1 kg)   LMP 01/25/2022   SpO2 99%   BMI 34.01 kg/m    General: Awake, alert, oriented, in no acute distress, pleasant and cooperative with examination HEENT: Normocephalic, nares patent, dentition is fair, oropharynx without erythema but she did have b/l tonsillar exudates, TM's clear bilaterally, no thyroid nodules palpated Neck: Supple, no cervical LAD  Cardio: RRR without murmur, 2+ radial  pulses b/l Respiratory: CTAB without wheezing/rhonchi/rales Psych: Normal mood and affect  ASSESSMENT/PLAN:   1. Sore throat POCT Influenza A/B and Rapid Strep A testing negative. Likely viral. Reassuring exam. Discussed supportive care measures to help and return precautions.   Sharion Settler, [place]

## 2022-05-18 DIAGNOSIS — L732 Hidradenitis suppurativa: Secondary | ICD-10-CM | POA: Diagnosis not present

## 2022-05-25 ENCOUNTER — Ambulatory Visit: Payer: Medicare Other

## 2022-05-25 NOTE — Patient Instructions (Incomplete)
Victoria Rodriguez , Thank you for taking time to come for your Medicare Wellness Visit. I appreciate your ongoing commitment to your health goals. Please review the following plan we discussed and let me know if I can assist you in the future.   These are the goals we discussed:  Goals   None     This is a list of the screening recommended for you and due dates:  Health Maintenance  Topic Date Due   Medicare Annual Wellness Visit  Never done   COVID-19 Vaccine (4 - 2023-24 season) 01/29/2022   Pap Smear  09/24/2022   DTaP/Tdap/Td vaccine (3 - Td or Tdap) 12/08/2025   Flu Shot  Completed   Hepatitis C Screening: USPSTF Recommendation to screen - Ages 18-79 yo.  Completed   HIV Screening  Completed   HPV Vaccine  Aged Out    Advanced directives: ***  Conditions/risks identified: ***  Next appointment: Follow up in one year for your annual wellness visit. ***  Preventive Care 40-64 Years, Female Preventive care refers to lifestyle choices and visits with your health care provider that can promote health and wellness. What does preventive care include? A yearly physical exam. This is also called an annual well check. Dental exams once or twice a year. Routine eye exams. Ask your health care provider how often you should have your eyes checked. Personal lifestyle choices, including: Daily care of your teeth and gums. Regular physical activity. Eating a healthy diet. Avoiding tobacco and drug use. Limiting alcohol use. Practicing safe sex. Taking low-dose aspirin daily starting at age 26. Taking vitamin and mineral supplements as recommended by your health care provider. What happens during an annual well check? The services and screenings done by your health care provider during your annual well check will depend on your age, overall health, lifestyle risk factors, and family history of disease. Counseling  Your health care provider may ask you questions about your: Alcohol  use. Tobacco use. Drug use. Emotional well-being. Home and relationship well-being. Sexual activity. Eating habits. Work and work Statistician. Method of birth control. Menstrual cycle. Pregnancy history. Screening  You may have the following tests or measurements: Height, weight, and BMI. Blood pressure. Lipid and cholesterol levels. These may be checked every 5 years, or more frequently if you are over 69 years old. Skin check. Lung cancer screening. You may have this screening every year starting at age 77 if you have a 30-pack-year history of smoking and currently smoke or have quit within the past 15 years. Fecal occult blood test (FOBT) of the stool. You may have this test every year starting at age 42. Flexible sigmoidoscopy or colonoscopy. You may have a sigmoidoscopy every 5 years or a colonoscopy every 10 years starting at age 9. Hepatitis C blood test. Hepatitis B blood test. Sexually transmitted disease (STD) testing. Diabetes screening. This is done by checking your blood sugar (glucose) after you have not eaten for a while (fasting). You may have this done every 1-3 years. Mammogram. This may be done every 1-2 years. Talk to your health care provider about when you should start having regular mammograms. This may depend on whether you have a family history of breast cancer. BRCA-related cancer screening. This may be done if you have a family history of breast, ovarian, tubal, or peritoneal cancers. Pelvic exam and Pap test. This may be done every 3 years starting at age 54. Starting at age 35, this may be done every 5 years  if you have a Pap test in combination with an HPV test. Bone density scan. This is done to screen for osteoporosis. You may have this scan if you are at high risk for osteoporosis. Discuss your test results, treatment options, and if necessary, the need for more tests with your health care provider. Vaccines  Your health care provider may recommend  certain vaccines, such as: Influenza vaccine. This is recommended every year. Tetanus, diphtheria, and acellular pertussis (Tdap, Td) vaccine. You may need a Td booster every 10 years. Zoster vaccine. You may need this after age 52. Pneumococcal 13-valent conjugate (PCV13) vaccine. You may need this if you have certain conditions and were not previously vaccinated. Pneumococcal polysaccharide (PPSV23) vaccine. You may need one or two doses if you smoke cigarettes or if you have certain conditions. Talk to your health care provider about which screenings and vaccines you need and how often you need them. This information is not intended to replace advice given to you by your health care provider. Make sure you discuss any questions you have with your health care provider. Document Released: 06/13/2015 Document Revised: 02/04/2016 Document Reviewed: 03/18/2015 Elsevier Interactive Patient Education  2017 [employer] Prevention in the Home Falls can cause injuries. They can happen to people of all ages. There are many things you can do to make your home safe and to help prevent falls. What can I do on the outside of my home? Regularly fix the edges of walkways and driveways and fix any cracks. Remove anything that might make you trip as you walk through a door, such as a raised step or threshold. Trim any bushes or trees on the path to your home. Use bright outdoor lighting. Clear any walking paths of anything that might make someone trip, such as rocks or tools. Regularly check to see if handrails are loose or broken. Make sure that both sides of any steps have handrails. Any raised decks and porches should have guardrails on the edges. Have any leaves, snow, or ice cleared regularly. Use sand or salt on walking paths during winter. Clean up any spills in your garage right away. This includes oil or grease spills. What can I do in the bathroom? Use night lights. Install grab bars  by the toilet and in the tub and shower. Do not use towel bars as grab bars. Use non-skid mats or decals in the tub or shower. If you need to sit down in the shower, use a plastic, non-slip stool. Keep the floor dry. Clean up any water that spills on the floor as soon as it happens. Remove soap buildup in the tub or shower regularly. Attach bath mats securely with double-sided non-slip rug tape. Do not have throw rugs and other things on the floor that can make you trip. What can I do in the bedroom? Use night lights. Make sure that you have a light by your bed that is easy to reach. Do not use any sheets or blankets that are too big for your bed. They should not hang down onto the floor. Have a firm chair that has side arms. You can use this for support while you get dressed. Do not have throw rugs and other things on the floor that can make you trip. What can I do in the kitchen? Clean up any spills right away. Avoid walking on wet floors. Keep items that you use a lot in easy-to-reach places. If you need to reach something  above you, use a strong step stool that has a grab bar. Keep electrical cords out of the way. Do not use floor polish or wax that makes floors slippery. If you must use wax, use non-skid floor wax. Do not have throw rugs and other things on the floor that can make you trip. What can I do with my stairs? Do not leave any items on the stairs. Make sure that there are handrails on both sides of the stairs and use them. Fix handrails that are broken or loose. Make sure that handrails are as long as the stairways. Check any carpeting to make sure that it is firmly attached to the stairs. Fix any carpet that is loose or worn. Avoid having throw rugs at the top or bottom of the stairs. If you do have throw rugs, attach them to the floor with carpet tape. Make sure that you have a light switch at the top of the stairs and the bottom of the stairs. If you do not have them, ask  someone to add them for you. What else can I do to help prevent falls? Wear shoes that: Do not have high heels. Have rubber bottoms. Are comfortable and fit you well. Are closed at the toe. Do not wear sandals. If you use a stepladder: Make sure that it is fully opened. Do not climb a closed stepladder. Make sure that both sides of the stepladder are locked into place. Ask someone to hold it for you, if possible. Clearly mark and make sure that you can see: Any grab bars or handrails. First and last steps. Where the edge of each step is. Use tools that help you move around (mobility aids) if they are needed. These include: Canes. Walkers. Scooters. Crutches. Turn on the lights when you go into a dark area. Replace any light bulbs as soon as they burn out. Set up your furniture so you have a clear path. Avoid moving your furniture around. If any of your floors are uneven, fix them. If there are any pets around you, be aware of where they are. Review your medicines with your doctor. Some medicines can make you feel dizzy. This can increase your chance of falling. Ask your doctor what other things that you can do to help prevent falls. This information is not intended to replace advice given to you by your health care provider. Make sure you discuss any questions you have with your health care provider. Document Released: 03/13/2009 Document Revised: 10/23/2015 Document Reviewed: 06/21/2014 Elsevier Interactive Patient Education  2017 [employer].

## 2022-06-16 ENCOUNTER — Other Ambulatory Visit: Payer: Self-pay | Admitting: Family Medicine

## 2022-06-16 DIAGNOSIS — Z1231 Encounter for screening mammogram for malignant neoplasm of breast: Secondary | ICD-10-CM

## 2022-07-06 ENCOUNTER — Ambulatory Visit (INDEPENDENT_AMBULATORY_CARE_PROVIDER_SITE_OTHER): Payer: Medicare Other | Admitting: Family Medicine

## 2022-07-06 VITALS — BP 136/92 | HR 82 | Temp 99.5°F | Ht 63.0 in | Wt 197.8 lb

## 2022-07-06 DIAGNOSIS — R0981 Nasal congestion: Secondary | ICD-10-CM | POA: Insufficient documentation

## 2022-07-06 LAB — POC SOFIA 2 FLU + SARS ANTIGEN FIA
Influenza A, POC: NEGATIVE
Influenza B, POC: POSITIVE — AB
SARS Coronavirus 2 Ag: NEGATIVE

## 2022-07-06 NOTE — Patient Instructions (Addendum)
It was great seeing you today!  Today we discussed your symptoms, I am sorry that you are unwell. It is likely a GI illness that you have, this is likely viral. Your flu and COVID testing are pending. Honey can help with the cough along with warm fluids. Please drink plenty of water to stay hydrated.   If you experience shortness of breath or cannot tolerate fluids then please go to the emergency department.   Please follow up at your next scheduled appointment, if anything arises between now and then, please don't hesitate to contact our office.   Thank you for allowing Victoria Rodriguez to be a part of your medical care!  Thank you, Dr. Larae Grooms  Also a reminder of our clinic's no-show policy. Please make sure to arrive at least 15 minutes prior to your scheduled appointment time. Please try to cancel before 24 hours if you are not able to make it. If you no-show for 2 appointments then you will be receiving a warning letter. If you no-show after 3 visits, then you may be at risk of being dismissed from our clinic. This is to ensure that everyone is able to be seen in a timely manner. Thank you, we appreciate your assistance with this!

## 2022-07-06 NOTE — Assessment & Plan Note (Signed)
-  no indication for bacterial etiology on history and exam, likely of viral etiology. Also considered gastroenteritis.  -positive for influenza B -instructed to stay hydrated given multiple episodes of diarrhea -supportive measures discussed -reassurance provided -work note provided  -ED precautions discussed

## 2022-07-06 NOTE — Progress Notes (Signed)
    SUBJECTIVE:   CHIEF COMPLAINT / HPI:   Patient presents with rhinorrhea and congestion that started 2 days ago. Today she started to have diarrhea, had had 5 episodes of diarrhea thus far. Reports intermittent headache. Denies eating any new foods or take out. Has had only home cooked meals recently. Only sick contact includes a friend that she was driving with 2 days ago who also had similar symptoms including diarrhea. Denies use of recent antibiotics. Denies fever, chills, myalgias dyspnea or chest pain. Works for the [employer]. Denies any other concerns at this time.   OBJECTIVE:   BP (!) 136/92   Pulse 82   Temp 99.5 F (37.5 C)   Ht '5\' 3"'$  (1.6 m)   Wt 197 lb 12.8 oz (89.7 kg)   LMP 01/25/2022   SpO2 100%   BMI 35.04 kg/m   General: Patient well-appearing, in no acute distress. HEENT: moist mucous membranes, presence of anterior cervical LAD CV: RRR, no murmurs or gallops auscultated Resp: CTAB, no wheezing, rales or rhonchi noted Abdomen: soft, nontender, nondistended, presence of bowel sounds   ASSESSMENT/PLAN:   Nasal congestion -no indication for bacterial etiology on history and exam, likely of viral etiology. Also considered gastroenteritis.  -positive for influenza B -instructed to stay hydrated given multiple episodes of diarrhea -supportive measures discussed -reassurance provided -work note provided  -ED precautions discussed  -PHQ-9 score of 2 with negative question 9 reviewed.   Donney Dice, [place]

## 2022-07-15 ENCOUNTER — Ambulatory Visit (INDEPENDENT_AMBULATORY_CARE_PROVIDER_SITE_OTHER): Payer: Medicare Other | Admitting: Family Medicine

## 2022-07-15 VITALS — BP 130/70 | HR 60 | Ht 63.0 in | Wt 196.4 lb

## 2022-07-15 DIAGNOSIS — G44209 Tension-type headache, unspecified, not intractable: Secondary | ICD-10-CM | POA: Diagnosis not present

## 2022-07-15 DIAGNOSIS — R030 Elevated blood-pressure reading, without diagnosis of hypertension: Secondary | ICD-10-CM | POA: Diagnosis not present

## 2022-07-15 DIAGNOSIS — D509 Iron deficiency anemia, unspecified: Secondary | ICD-10-CM | POA: Diagnosis not present

## 2022-07-15 NOTE — Progress Notes (Signed)
SUBJECTIVE:   CHIEF COMPLAINT / HPI:   Victoria Rodriguez is a 42 y.o. female who presents to the [facility] clinic today to discuss the following concerns:   HEADACHE   Onset: For the last 2 weeks Location: Frontal  Quality: Throbbing Frequency: Typically daily  Precipitating factors: Unclear Prior treatment: Taking Ibuprofen as needed, about 1-2 times a day, 600 mg each time. Sometimes helps.  Associated Symptoms Nausea/vomiting: yes, only in the last two days she has felt nauseous but without vomiting. Feels like "a tummy ache"   Photophobia/phonophobia: no  Tearing of eyes: no  Sinus pain/pressure: no  Family hx migraine: no  Personal stressors: no  Relation to menstrual cycle: no, she had a complete hysterectomy in September   Red Flags Fever: no  Neck pain/stiffness: no  Vision/speech/swallow/hearing difficulty: no  Focal weakness/numbness: no  Altered mental status: no  Trauma: yes  New type of headache: no  Anticoagulant use: no  H/o cancer/HIV/Pregnancy: no   Elevated Blood Pressure Elevated at last visit as well. Without dx of HTN. Not on medications for this.   BP Readings from Last 3 Encounters:  07/15/22 (!) 143/89  07/06/22 (!) 136/92  05/10/22 110/68    PERTINENT  PMH / PSH: Iron deficiency anemia, fibroids  OBJECTIVE:   BP (!) 143/89   Pulse 60   Ht 5' 3"$  (1.6 m)   Wt 196 lb 6.4 oz (89.1 kg)   LMP 01/25/2022   SpO2 100%   BMI 34.79 kg/m   Vitals:   07/15/22 1042 07/15/22 1104  BP: (!) 143/89 130/70  Pulse: 60   SpO2: 100%     General: NAD, pleasant, able to participate in exam Cardiac: RRR, no murmurs. Respiratory: CTAB, normal effort, No wheezes, rales or rhonchi Extremities: no edema or cyanosis. Skin: warm and dry, no rashes noted Neuro: Cranial Nerves: II: Visual Fields are full. PERRL.  III,IV, VI: EOMI without ptosis or diplopia.  V: Facial sensation is symmetric to temperature VII: Facial movement is symmetric.  VIII:  hearing is intact to voice X: Palate elevates symmetrically XI: Shoulder shrug is symmetric. XII: tongue is midline without atrophy or fasciculations.  Motor: Tone is normal. Bulk is normal. 5/5 strength was present in all four extremities.  Sensory: Sensation is symmetric to light touch and temperature in the arms and legs. Cerebellar: FNF and HKS are intact bilaterally Psych: Normal affect and mood  ASSESSMENT/PLAN:   1. Iron deficiency anemia Thought to be due from menorrhagia. She is now s/p hysterectomy. Would like iron levels checked to see if improving.  She is not on iron supplementation. - Ferritin  2. Tension headache History and exam most consistent with tension headaches.  Her symptoms do not suggest migraines or other more serious intracranial pathology.  Neuroexam was very reassuring.  We discussed incorporating more exercise, drinking more fluids, and limiting analgesics to help.  She is provided with a headache diary and more information on her AVS.  She should return if symptoms worsen, headache changes, or any other concerns.   3. Elevated blood pressure reading in office without diagnosis of hypertension Elevated initially but improved on recheck.  She has had elevated blood pressures in the past, would benefit from 24-hour ambulatory monitoring.  She is obese. Encouraged her to try to exercise as this can help her overall health, she seems motivated to do so. -Scheduled with Dr. Valentina Lucks on 2/27   Victoria Rodriguez, [place]

## 2022-07-15 NOTE — Patient Instructions (Addendum)
It was wonderful to see you today.  Please bring ALL of your medications with you to every visit.   Today we talked about:  I believe that you have a tension headache. Typically these are caused by not getting enough sleep, not eating regular meals, not drinking enough water, not exercising, and/or significant stress. Improving in these areas should help!   Additionally, there is such thing as medication overuse headaches. I would limit the use of Tylenol or Ibuprofen to one time weekly only for significant headaches.   I have attached a headache diary below which can help identify triggers as well.   Please return if the headaches cause vision troubles, weakness, nausea/vomiting, gait changes, or change in the way they feel.  Headache Diary   Date         Day of week (Mon-Sun)         Time headache began         Time headache ended         Intensity of pain  (1-10)         Other symptoms  (nausea, vomiting, bothered by light/sound)         Did you take medicine?         Intensity of pain after medication  (1-10)         Hours of sleep night before headache         Activities before headache         Important/stressful events that day           Thank you for coming to your visit as scheduled. We have had a large "no-show" problem lately, and this significantly limits our ability to see and care for patients. As a friendly reminder- if you cannot make your appointment please call to cancel. We do have a no show policy for those who do not cancel within 24 hours. Our policy is that if you miss or fail to cancel an appointment within 24 hours, 3 times in a 69-monthperiod, you may be dismissed from our clinic.   [facility] you [facility]   Please call [phone] any questions about today's appointment.  Please be sure to schedule follow up at the front  desk before you leave today.   ASharion Settler DO PGY-3 Family  Medicine

## 2022-07-16 LAB — FERRITIN: Ferritin: 22 ng/mL (ref 15–150)

## 2022-07-27 ENCOUNTER — Ambulatory Visit (INDEPENDENT_AMBULATORY_CARE_PROVIDER_SITE_OTHER): Payer: Medicare Other | Admitting: Family Medicine

## 2022-07-27 ENCOUNTER — Encounter: Payer: Self-pay | Admitting: Family Medicine

## 2022-07-27 ENCOUNTER — Ambulatory Visit: Payer: Medicare Other | Admitting: Pharmacist

## 2022-07-27 VITALS — BP 124/82 | HR 85 | Temp 99.5°F | Ht 63.0 in | Wt 200.0 lb

## 2022-07-27 DIAGNOSIS — J029 Acute pharyngitis, unspecified: Secondary | ICD-10-CM | POA: Diagnosis not present

## 2022-07-27 NOTE — Progress Notes (Signed)
    SUBJECTIVE:   CHIEF COMPLAINT / HPI:   Patient presents with myalgias, headache, sore throat and chills that first started 2 days ago, she seems to be feeling worse today than she did yesterday. Attempted to take a home COVID test yesterday but that was inconclusive. Sick contacts include 2 people at work who tested positive for [place], she works at a school. Recently had Influenza B earlier in the month. Denies fever or dyspnea. Staying hydrated.   OBJECTIVE:   BP 124/82   Pulse 85   Temp 99.5 F (37.5 C)   Ht 5' 3"$  (1.6 m)   Wt 200 lb (90.7 kg)   LMP 01/25/2022   SpO2 100%   BMI 35.43 kg/m   General: Patient tired appearing, in no acute distress. HEENT: PERRLA, no tonsillar exudate or erythema, presence of anterior cervical LAD CV: RRR, no murmurs or gallops auscultated Resp: CTAB, no wheezing, rales or rhonchi noted Ext: capillary refill less than 2 sec  ASSESSMENT/PLAN:   Sore throat -likely secondary to viral etiology as she has no evidence of bacterial infection on exam  -COVID/influenza testing pending -supportive care measures discussed and reassurance provided -work note provided  -follow up as appropriate     Donney Dice, [place]

## 2022-07-27 NOTE — Assessment & Plan Note (Signed)
-  likely secondary to viral etiology as she has no evidence of bacterial infection on exam  -COVID/influenza testing pending -supportive care measures discussed and reassurance provided -work note provided  -follow up as appropriate

## 2022-07-27 NOTE — Patient Instructions (Signed)
It was great seeing you today!  I am sorry that you are unwell. Your COVID and flu testing are pending, I will let you know of the results when they return. Please stay hydrated and get plenty of rest.   Please follow up at your next scheduled appointment, if anything arises between now and then, please don't hesitate to contact our office.   Thank you for allowing [place] to be a part of your medical care!  Thank you, Dr. Larae Grooms  Also a reminder of our clinic's no-show policy. Please make sure to arrive at least 15 minutes prior to your scheduled appointment time. Please try to cancel before 24 hours if you are not able to make it. If you no-show for 2 appointments then you will be receiving a warning letter. If you no-show after 3 visits, then you may be at risk of being dismissed from our clinic. This is to ensure that everyone is able to be seen in a timely manner. Thank you, we appreciate your assistance with this!

## 2022-07-29 ENCOUNTER — Encounter: Payer: Self-pay | Admitting: Family Medicine

## 2022-07-29 LAB — COVID-19, FLU A+B NAA
Influenza A, NAA: NOT DETECTED
Influenza B, NAA: NOT DETECTED
SARS-CoV-2, NAA: DETECTED — AB

## 2022-08-04 ENCOUNTER — Ambulatory Visit: Payer: Medicare Other

## 2022-08-09 ENCOUNTER — Encounter: Payer: Self-pay | Admitting: Pharmacist

## 2022-08-09 ENCOUNTER — Ambulatory Visit (INDEPENDENT_AMBULATORY_CARE_PROVIDER_SITE_OTHER): Payer: Medicare Other | Admitting: Pharmacist

## 2022-08-09 ENCOUNTER — Ambulatory Visit
Admission: EM | Admit: 2022-08-09 | Discharge: 2022-08-09 | Disposition: A | Discharge status: Home / Self Care | Payer: Medicare Other | Attending: Internal Medicine | Admitting: Internal Medicine

## 2022-08-09 VITALS — BP 158/86 | HR 68 | Wt 200.2 lb

## 2022-08-09 DIAGNOSIS — R030 Elevated blood-pressure reading, without diagnosis of hypertension: Secondary | ICD-10-CM

## 2022-08-09 DIAGNOSIS — L732 Hidradenitis suppurativa: Secondary | ICD-10-CM

## 2022-08-09 DIAGNOSIS — Z202 Contact with and (suspected) exposure to infections with a predominantly sexual mode of transmission: Secondary | ICD-10-CM

## 2022-08-09 DIAGNOSIS — I10 Essential (primary) hypertension: Secondary | ICD-10-CM | POA: Insufficient documentation

## 2022-08-09 MED ORDER — DOXYCYCLINE HYCLATE 100 MG PO CAPS: 100.0000 mg | ORAL_CAPSULE | Freq: Two times a day (BID) | ORAL | 0 refills | Status: DC

## 2022-08-09 NOTE — Progress Notes (Signed)
Reviewed and agree with Dr Koval's plan.   
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
## 2022-08-09 NOTE — Patient Instructions (Signed)
Blood Pressure Activity Diary Time Lying down/ Sleeping Walking/ Exercise Stressed/ Angry Headache/ Pain Dizzy  9 AM       10 AM       11 AM       12 PM       1 PM       2 PM       Time Lying down/ Sleeping Walking/ Exercise Stressed/ Angry Headache/ Pain Dizzy  3 PM       4 PM        5 PM       6 PM       7 PM       8 PM       Time Lying down/ Sleeping Walking/ Exercise Stressed/ Angry Headache/ Pain Dizzy  9 PM       10 PM       11 PM       12 AM       1 AM       2 AM       3 AM       Time Lying down/ Sleeping Walking/ Exercise Stressed/ Angry Headache/ Pain Dizzy  4 AM       5 AM       6 AM       7 AM       8 AM       9 AM       10 AM        Time you woke up: _________                  Time you went to sleep:__________  Come back tomorrow at 8:30 to have the monitor removed Call the Family Medicine Clinic if you have any questions before then ([phone])  Wearing the Blood Pressure Monitor The cuff will inflate every 20 minutes during the day and every 30 minutes while you sleep. Your blood pressure readings will NOT display after cuff inflation Fill out the blood pressure-activity diary during the day, especially during activities that may affect your reading -- such as exercise, stress, walking, taking your blood pressure medications  Important things to know: Avoid taking the monitor off for the next 24 hours, unless it causes you discomfort or pain. Do NOT get the monitor wet and do NOT dry to clean the monitor with any cleaning products. Do NOT put the monitor on anyone else's arm. When the cuff inflates, avoid excess movement. Let the cuffed arm hang loosely, slightly away from the body. Avoid flexing the muscles or moving the hand/fingers. When you go to sleep, make sure that the hose is not kinked. Remember to fill out the blood pressure activity diary. If you experience severe pain or unusual pain (not associated with getting your blood pressure  checked), remove the monitor.  Troubleshooting:  Code  Troubleshooting   1  Check cuff position, tighten cuff   2, 3  Remain still during reading   4, 87  Check air hose connections and make sure cuff is tight   85, 89  Check hose connections and make tubing is not crimped   86  Push START/STOP to restart reading   88, 91  Retry by pushing START/STOP   90  Replace batteries. If problem persists, remove monitor and bring back to   clinic at follow up   97, 98, 99  Service required - Remove monitor and bring back to clinic at   follow up    

## 2022-08-09 NOTE — Assessment & Plan Note (Signed)
History of elevated in-office blood pressure readings.  Patient has family history of hypertension. Goal presssure of <130/80.     Placed ambulatory blood pressure device for 24 hour evaluation. Patient educated on purpose, and proper use.  Following instruction patient verbalized understanding of treatment plan.  Written patient instructions provided.

## 2022-08-09 NOTE — Progress Notes (Signed)
   S:     Chief Complaint  Patient presents with   Medication Management    Ambulatory BP monitoring   42 y.o. female who presents for hypertension evaluation, education, and management.  PMH is significant for anemia and obesity.  Patient was referred and last seen by Primary Care Provider, Dr. Nita Sells, on 07/15/2022.   At last visit, was identified as having persistently elevated in-office blood pressure readings > 007 systolic.   Patient is not currently on any home antihypertensive medications. Reports experiencing headaches at home, checks her blood pressure occasionally and states the highest systolic blood pressure she has seen is 140s-150 and the highest diastolic blood pressure she has seen is 90s.   Discussed procedure for wearing the monitor and gave patient written instructions. Monitor was placed on non-dominant arm with instructions to return in the morning.   Current BP Medications include: none  Antihypertensives tried in the past include: none  O:  Review of Systems  Neurological:  Positive for headaches.   Physical Exam Constitutional:      Appearance: Normal appearance.  Pulmonary:     Effort: Pulmonary effort is normal.  Neurological:     Mental Status: She is alert.  Psychiatric:        Mood and Affect: Mood normal.        Behavior: Behavior normal.        Thought Content: Thought content normal.    Last 3 Office BP readings: BP Readings from Last 3 Encounters:  08/09/22 (!) 152/86  07/27/22 124/82  07/15/22 130/70   Clinical Atherosclerotic Cardiovascular Disease (ASCVD): No   Basic Metabolic Panel    Component Value Date/Time   NA 137 03/03/2022 1355   NA 140 07/07/2017 1214   K 3.7 03/03/2022 1355   CL 103 03/03/2022 1355   CO2 25 03/03/2022 1355   GLUCOSE 96 03/03/2022 1355   BUN 8 03/03/2022 1355   BUN 8 07/07/2017 1214   CREATININE 0.52 03/03/2022 1355   CREATININE 0.61 07/19/2016 1102   CALCIUM 9.3 03/03/2022 1355   GFRNONAA >60  03/03/2022 1355   GFRAA >60 05/07/2019 1549    ABPM Study Data: Arm Placement left arm  For Office Goal Goal BP of <130/80:  ABPM thresholds: Overall BP < 125/75, daytime BP <130/80 mmHg, sleeptime BP <110/65 mmHg   Patient is participating in a Managed Medicaid Plan:  Yes   A/P: History of elevated in-office blood pressure readings.  Patient has family history of hypertension. Goal presssure of <130/80.     Placed ambulatory blood pressure device for 24 hour evaluation. Patient educated on purpose, and proper use.  Following instruction patient verbalized understanding of treatment plan.  Written patient instructions provided.  Total time in face to face counseling 20 minutes.     Follow-up:  Pharmacist in 08/10/2022 Patient seen with Louanne Belton PharmD PGY-1 Pharmacy Resident and Estelle June, PharmD Candidate.

## 2022-08-09 NOTE — Discharge Instructions (Addendum)
Your STD testing has been sent to the lab and will come back in the next 2 to 3 days.  We will call you if any of your results are positive requiring treatment and treat you at that time.   If you do not receive a phone call from [place], this means your testing was negative.  Avoid sexual intercourse until your STD results come back.  If any of your STD results are positive, you will need to avoid sexual intercourse for 7 days while you are being treated to prevent spread of STD.  Condom use is the best way to prevent spread of STDs.  Take doxycycline twice daily for the next 7 days to treat your HS outbreak.   Take epsom salt baths and perform warm compresses to area.   If you develop any new or worsening symptoms or do not improve in the next 2 to 3 days, please return.  If your symptoms are severe, please go to the emergency room.  Follow-up with your primary care provider for further evaluation and management of your symptoms as well as ongoing wellness visits.  I hope you feel better!

## 2022-08-09 NOTE — ED Triage Notes (Signed)
Pt c/o pelvic pain and vaginal irritation onset ~ 2 days ago. Requesting sti screening.   Currently on an intermittent BP device applied by PCP.   Denies pain in triage.

## 2022-08-10 ENCOUNTER — Encounter: Payer: Self-pay | Admitting: Pharmacist

## 2022-08-10 ENCOUNTER — Ambulatory Visit (INDEPENDENT_AMBULATORY_CARE_PROVIDER_SITE_OTHER): Payer: Medicare Other | Admitting: Pharmacist

## 2022-08-10 VITALS — BP 148/89 | Wt 202.0 lb

## 2022-08-10 DIAGNOSIS — I10 Essential (primary) hypertension: Secondary | ICD-10-CM | POA: Diagnosis not present

## 2022-08-10 DIAGNOSIS — R030 Elevated blood-pressure reading, without diagnosis of hypertension: Secondary | ICD-10-CM

## 2022-08-10 LAB — CERVICOVAGINAL ANCILLARY ONLY
Bacterial Vaginitis (gardnerella): POSITIVE — AB
Candida Glabrata: NEGATIVE
Candida Vaginitis: POSITIVE — AB
Chlamydia: NEGATIVE
Comment: NEGATIVE
Comment: NEGATIVE
Comment: NEGATIVE
Comment: NEGATIVE
Comment: NEGATIVE
Comment: NORMAL
Neisseria Gonorrhea: NEGATIVE
Trichomonas: NEGATIVE

## 2022-08-10 MED ORDER — LOSARTAN POTASSIUM 50 MG PO TABS: 50.0000 mg | ORAL_TABLET | Freq: Every day | ORAL | 3 refills | Status: DC

## 2022-08-10 NOTE — Progress Notes (Signed)
   S:     42 y.o. female who presents for hypertension evaluation, education, and management.  No PMH Patient was referred and last seen by Primary Care Provider, Dr. Nita Sells, on 07/15/22  Day #2 - Patient returns to clinic and reports they were almost able to wear the Ambulatory Blood Pressure Cuff for the entire 24 evaluation period. Patient reports taking the cuff off one time.  O:  Review of Systems  All other systems reviewed and are negative.   Physical Exam Constitutional:      Appearance: Normal appearance.  Neurological:     Mental Status: She is alert.  Psychiatric:        Mood and Affect: Mood normal.        Behavior: Behavior normal.        Thought Content: Thought content normal.     Last 3 Office BP readings: BP Readings from Last 3 Encounters:  08/09/22 (!) 174/84  08/09/22 (!) 158/86  07/27/22 233/00    Basic Metabolic Panel    Component Value Date/Time   NA 137 03/03/2022 1355   NA 140 07/07/2017 1214   K 3.7 03/03/2022 1355   CL 103 03/03/2022 1355   CO2 25 03/03/2022 1355   GLUCOSE 96 03/03/2022 1355   BUN 8 03/03/2022 1355   BUN 8 07/07/2017 1214   CREATININE 0.52 03/03/2022 1355   CREATININE 0.61 07/19/2016 1102   CALCIUM 9.3 03/03/2022 1355   GFRNONAA >60 03/03/2022 1355   GFRAA >60 05/07/2019 1549     ABPM Study Data: Arm Placement left arm  Overall Mean 24hr BP:   136/75  mmHg HR: 73  Daytime Mean BP:  148/83 mmHg  HR: 72  Nighttime Mean BP:  122/66 mmHg  HR: 73  Dipping Pattern: Yes.    Sys:   17.4%   Dia: 20.1%   [normal dipping ~10-20%]  For Office Goal Goal BP of <130/80:  ABPM thresholds: Overall BP < 125/75, daytime BP <130/80 mmHg, sleeptime BP <110/65 mmHg   Patient is participating in a Managed Medicaid Plan:  Yes   A/P: History of elevated BP readings outside of clinic consistent with Hypertension; with goal presssure of <130/80. Found to have persistently elevated and uncontrolled blood pressure with 24-hour  ambulatory blood pressure evaluation which demonstrates an average AWAKE blood pressure of 148/83 mmHg. Nocturnal dipping pattern is normal.   Changes to medications -Started 50 mg Losartan once daily. Patient educated on purpose, proper use and potential adverse effects of angioedema.  Following instruction patient verbalized understanding of treatment plan.    Results reviewed and written information provided.    Written patient instructions provided. Patient verbalized understanding of treatment plan.  Total time in face to face counseling 20 minutes.    Follow-up:  Pharmacist 08/19/22 PCP clinic visit in 09/16/22 with Dr. McDiarmid  Patient seen with Louanne Belton PharmD PGY-1 Pharmacy Resident, Estelle June, PharmD Candidate and

## 2022-08-10 NOTE — Patient Instructions (Addendum)
It was great to see you today!  Medication Changes: Start taking losartan 50 mg once daily

## 2022-08-10 NOTE — Assessment & Plan Note (Signed)
History of elevated BP readings outside of clinic consistent with Hypertension; with goal presssure of <130/80. Found to have persistently elevated and uncontrolled blood pressure with 24-hour ambulatory blood pressure evaluation which demonstrates an average AWAKE blood pressure of 148/83 mmHg. Nocturnal dipping pattern is normal.   Changes to medications -Started 50 mg Losartan once daily. Patient educated on purpose, proper use and potential adverse effects of angioedema.  Following instruction patient verbalized understanding of treatment plan.

## 2022-08-11 ENCOUNTER — Telehealth (HOSPITAL_COMMUNITY): Payer: Self-pay | Admitting: Emergency Medicine

## 2022-08-11 MED ORDER — METRONIDAZOLE 500 MG PO TABS: 500.0000 mg | ORAL_TABLET | Freq: Two times a day (BID) | ORAL | 0 refills | Status: DC

## 2022-08-11 MED ORDER — FLUCONAZOLE 150 MG PO TABS: 150.0000 mg | ORAL_TABLET | Freq: Once | ORAL | 0 refills | Status: AC

## 2022-08-13 ENCOUNTER — Emergency Department (HOSPITAL_COMMUNITY): Payer: Medicare Other

## 2022-08-13 ENCOUNTER — Encounter (HOSPITAL_COMMUNITY): Payer: Self-pay

## 2022-08-13 ENCOUNTER — Other Ambulatory Visit: Payer: Self-pay

## 2022-08-13 ENCOUNTER — Emergency Department (HOSPITAL_COMMUNITY)
Admission: EM | Admit: 2022-08-13 | Discharge: 2022-08-13 | Discharge status: Against Medical Advice | Payer: Medicare Other | Attending: Emergency Medicine | Admitting: Emergency Medicine

## 2022-08-13 DIAGNOSIS — Z5321 Procedure and treatment not carried out due to patient leaving prior to being seen by health care provider: Secondary | ICD-10-CM | POA: Insufficient documentation

## 2022-08-13 DIAGNOSIS — R079 Chest pain, unspecified: Secondary | ICD-10-CM | POA: Diagnosis not present

## 2022-08-13 DIAGNOSIS — R519 Headache, unspecified: Secondary | ICD-10-CM | POA: Insufficient documentation

## 2022-08-13 DIAGNOSIS — R0789 Other chest pain: Secondary | ICD-10-CM | POA: Diagnosis not present

## 2022-08-13 DIAGNOSIS — R42 Dizziness and giddiness: Secondary | ICD-10-CM | POA: Diagnosis not present

## 2022-08-13 LAB — BASIC METABOLIC PANEL
Anion gap: 10 (ref 5–15)
BUN: 11 mg/dL (ref 6–20)
CO2: 25 mmol/L (ref 22–32)
Calcium: 9.4 mg/dL (ref 8.9–10.3)
Chloride: 101 mmol/L (ref 98–111)
Creatinine, Ser: 0.65 mg/dL (ref 0.44–1.00)
GFR, Estimated: >60 mL/min (ref >60)
Glucose, Bld: 82 mg/dL (ref 70–99)
Potassium: 3.6 mmol/L (ref 3.5–5.1)
Sodium: 136 mmol/L (ref 135–145)

## 2022-08-13 LAB — CBC
HCT: 36.7 % (ref 36.0–46.0)
Hemoglobin: 11.2 g/dL — ABNORMAL LOW (ref 12.0–15.0)
MCH: 25.4 pg — ABNORMAL LOW (ref 26.0–34.0)
MCHC: 30.5 g/dL (ref 30.0–36.0)
MCV: 83.2 fL (ref 80.0–100.0)
Platelets: 334 10*3/uL (ref 150–400)
RBC: 4.41 MIL/uL (ref 3.87–5.11)
RDW: 16 % — ABNORMAL HIGH (ref 11.5–15.5)
WBC: 5 10*3/uL (ref 4.0–10.5)
nRBC: 0 % (ref 0.0–0.2)

## 2022-08-13 LAB — TROPONIN I (HIGH SENSITIVITY): Troponin I (High Sensitivity): 3 ng/L (ref <18)

## 2022-08-13 NOTE — ED Triage Notes (Signed)
Pt came in via POV d/t Rt sided CP that started yesterday, denies radiation & rates it a 6/10 pressure feeling. Also reports she has been having HA last few weeks & was started on a HTN med 4 days ago, denies Hx of cardiac events or migraines but endorse some light headedness recently.

## 2022-08-13 NOTE — ED Notes (Signed)
Pt called for room x3. Pt no response. Removed from floor.

## 2022-08-13 NOTE — ED Provider Triage Note (Signed)
Emergency Medicine Provider Triage Evaluation Note  Victoria Rodriguez , a 42 y.o. female  was evaluated in triage.  Pt complains of right-sided chest pain and headache.  Pain began last night and has progressed into today.  Rates it as mild.  It does not radiate.  Not exertional.  No shortness of breath.  Endorses some associated dizziness.  Denies nausea, vomiting, diaphoresis.  States she has been having multiple headaches daily for the past couple months.  Was recently started on antihypertensives.  Review of Systems  Positive: As above Negative: As above  Physical Exam  BP (!) 167/89 (BP Location: Right Arm)   Pulse 77   Temp 98.5 F (36.9 C) (Oral)   Resp 19   LMP 01/25/2022   SpO2 100%  Gen:   Awake, no distress   Resp:  Normal effort  MSK:   Moves extremities without difficulty  Other:    Medical Decision Making  Medically screening exam initiated at 6:24 PM.  Appropriate orders placed.  [name] was informed that the remainder of the evaluation will be completed by another provider, this initial triage assessment does not replace that evaluation, and the importance of remaining in the ED until their evaluation is complete.  ACS rule out   Nehemiah Massed 08/13/22 1825

## 2022-08-13 NOTE — ED Notes (Signed)
Called for H16, no answer pt not seen in [place] lobby

## 2022-08-17 ENCOUNTER — Telehealth: Payer: Self-pay

## 2022-08-17 NOTE — Telephone Encounter (Signed)
     Patient  visit on 3/15  at [facility]   Have you been able to follow up with your primary care physician? Yes   The patient was or was not able to obtain any needed medicine or equipment. Yes   Are there diet recommendations that you are having difficulty following? Na   Patient expresses understanding of discharge instructions and education provided has no other needs at this time.  Yes     [facility] [phone] [address], [place], [place] [address] Phone: [phone] Email: [email or web address]

## 2022-08-18 NOTE — Progress Notes (Signed)
Reviewed and agree with Dr Koval's plan.   

## 2022-08-19 ENCOUNTER — Ambulatory Visit (INDEPENDENT_AMBULATORY_CARE_PROVIDER_SITE_OTHER): Payer: Medicare Other | Admitting: Pharmacist

## 2022-08-19 ENCOUNTER — Encounter: Payer: Self-pay | Admitting: Pharmacist

## 2022-08-19 VITALS — BP 124/76 | HR 73 | Wt 198.2 lb

## 2022-08-19 DIAGNOSIS — I10 Essential (primary) hypertension: Secondary | ICD-10-CM

## 2022-08-19 DIAGNOSIS — E669 Obesity, unspecified: Secondary | ICD-10-CM

## 2022-08-19 DIAGNOSIS — R030 Elevated blood-pressure reading, without diagnosis of hypertension: Secondary | ICD-10-CM | POA: Diagnosis not present

## 2022-08-19 NOTE — Assessment & Plan Note (Signed)
Hypertension diagnosed recently, currently at goal on current medications. BP goal < 130/80 mmHg. Medication adherence appropriate. Home BP and clinic BP at goal.  -Continued losartan 50 mg daily.  -Patient educated on purpose, proper use, and potential adverse effects of losartan.  -F/u labs ordered - BMET  -Counseled on lifestyle modifications for blood pressure control including reduced dietary sodium, increased exercise, adequate sleep. -Encouraged patient to check BP at home and bring log of readings to next visit. Counseled on proper use of home BP cuff.

## 2022-08-19 NOTE — Patient Instructions (Signed)
It was nice to see you today!  Your goal blood pressure is <130/80  mmHg.  Medication Changes: Continue losartan 50 mg daily.   Monitor blood pressure at home daily and keep a log (on your phone or piece of paper) to bring with you to your next visit. Write down date, time, blood pressure and pulse.  Keep up the good work with diet and exercise. Aim for a diet full of vegetables, fruit and lean meats (chicken, [place], fish). Try to limit salt intake by eating fresh or frozen vegetables (instead of canned), rinse canned vegetables prior to cooking and do not add any additional salt to meals.

## 2022-08-19 NOTE — Assessment & Plan Note (Signed)
Obesity with BMI of 35.11. Patient would like to work towards losing weight.  -Obtain lipid panel to determine if lipid lowering therapy is appropriate.

## 2022-08-19 NOTE — Progress Notes (Signed)
   S:    Chief Complaint  Patient presents with   Medication Management    Blood Pressure Follow-Up   42 y.o. female who presents for hypertension evaluation, education, and management. PMH is significant for Headaches.  Patient was referred and last seen by Primary Care Provider, Dr. Nita Sells, on 07/15/2022. Last seen by pharmacy clinic for ambulatory BP monitor on 08/09/2022.  Losartan 50 mg daily was started at that visit.   Today, patient arrives in good spirits and presents without assistance. Denies dizziness, headache, blurred vision, swelling. Reports significant improvement in headaches since starting losartan. She reported ~3 headaches/day, now she is having less than 1 headache per day.   Medication adherence reported. Patient took BP medications last night  Current antihypertensives include: losartan 50mg  daily (taking at night)  Reported home BP readings:  131/81, 125/80, 128/83, 107/87, 134/93, 108/97, 150/90, 138/79, 132/89  ASCVD risk factors include: HTN  O:  Review of Systems  Neurological:  Negative for headaches (fewer than in the recent past).    Physical Exam  Last 3 Office BP readings: BP Readings from Last 3 Encounters:  08/19/22 124/76  08/13/22 (!) 167/89  08/10/22 (!) 148/89    BMET    Component Value Date/Time   NA 136 08/13/2022 1824   NA 140 07/07/2017 1214   K 3.6 08/13/2022 1824   CL 101 08/13/2022 1824   CO2 25 08/13/2022 1824   GLUCOSE 82 08/13/2022 1824   BUN 11 08/13/2022 1824   BUN 8 07/07/2017 1214   CREATININE 0.65 08/13/2022 1824   CREATININE 0.61 07/19/2016 1102   CALCIUM 9.4 08/13/2022 1824   GFRNONAA >60 08/13/2022 1824   GFRAA >60 05/07/2019 1549    Renal function: Estimated Creatinine Clearance: 96.5 mL/min (by C-G formula based on SCr of 0.65 mg/dL).  Clinical ASCVD: No  The ASCVD Risk score (Arnett DK, et al., 2019) failed to calculate for the following reasons:   Cannot find a previous HDL lab   Cannot find a  previous total cholesterol lab    A/P: Hypertension diagnosed recently, currently at goal on current medications. BP goal < 130/80 mmHg. Medication adherence appropriate. Home BP and clinic BP at goal.  -Continued losartan 50 mg daily.  -Patient educated on purpose, proper use, and potential adverse effects of losartan.  -F/u labs ordered - BMET  -Counseled on lifestyle modifications for blood pressure control including reduced dietary sodium, increased exercise, adequate sleep. -Encouraged patient to check BP at home and bring log of readings to next visit. Counseled on proper use of home BP cuff.   Obesity with BMI of 35.11. Patient would like to work towards losing weight.  -Obtain lipid panel to determine if lipid lowering therapy is appropriate.   Results reviewed and written information provided.    Written patient instructions provided. Patient verbalized understanding of treatment plan.  Total time in face to face counseling 30 minutes.    Follow-up:  Pharmacist PRN. PCP clinic visit in April 2024.  Patient seen with Joseph Art, PharmD, PGY2 Pharmacy Resident.

## 2022-08-20 LAB — LIPID PANEL
Chol/HDL Ratio: 2.8 ratio (ref 0.0–4.4)
Cholesterol, Total: 133 mg/dL (ref 100–199)
HDL: 47 mg/dL (ref >39)
LDL Chol Calc (NIH): 76 mg/dL (ref 0–99)
Triglycerides: 45 mg/dL (ref 0–149)
VLDL Cholesterol Cal: 10 mg/dL (ref 5–40)

## 2022-08-20 LAB — BASIC METABOLIC PANEL
BUN/Creatinine Ratio: 16 (ref 9–23)
BUN: 10 mg/dL (ref 6–24)
CO2: 21 mmol/L (ref 20–29)
Calcium: 9.4 mg/dL (ref 8.7–10.2)
Chloride: 103 mmol/L (ref 96–106)
Creatinine, Ser: 0.61 mg/dL (ref 0.57–1.00)
Glucose: 73 mg/dL (ref 70–99)
Potassium: 4 mmol/L (ref 3.5–5.2)
Sodium: 140 mmol/L (ref 134–144)
eGFR: 114 mL/min/{1.73_m2} (ref >59)

## 2022-08-20 NOTE — Progress Notes (Signed)
Reviewed and agree with Dr Koval's plan.   

## 2022-09-16 ENCOUNTER — Encounter: Payer: Self-pay | Admitting: Family Medicine

## 2022-09-16 ENCOUNTER — Ambulatory Visit
Admission: RE | Admit: 2022-09-16 | Discharge: 2022-09-16 | Disposition: A | Discharge status: Home / Self Care | Payer: Medicare Other | Source: Ambulatory Visit | Attending: Family Medicine | Admitting: Family Medicine

## 2022-09-16 ENCOUNTER — Ambulatory Visit (INDEPENDENT_AMBULATORY_CARE_PROVIDER_SITE_OTHER): Payer: Medicare Other | Admitting: Family Medicine

## 2022-09-16 VITALS — BP 120/60 | HR 60 | Ht 63.0 in | Wt 195.1 lb

## 2022-09-16 DIAGNOSIS — R739 Hyperglycemia, unspecified: Secondary | ICD-10-CM | POA: Diagnosis not present

## 2022-09-16 DIAGNOSIS — D509 Iron deficiency anemia, unspecified: Secondary | ICD-10-CM | POA: Diagnosis not present

## 2022-09-16 DIAGNOSIS — L02821 Furuncle of head [any part, except face]: Secondary | ICD-10-CM | POA: Diagnosis not present

## 2022-09-16 DIAGNOSIS — Z131 Encounter for screening for diabetes mellitus: Secondary | ICD-10-CM | POA: Insufficient documentation

## 2022-09-16 DIAGNOSIS — I10 Essential (primary) hypertension: Secondary | ICD-10-CM | POA: Diagnosis not present

## 2022-09-16 DIAGNOSIS — Z1231 Encounter for screening mammogram for malignant neoplasm of breast: Secondary | ICD-10-CM | POA: Diagnosis not present

## 2022-09-16 LAB — POCT GLYCOSYLATED HEMOGLOBIN (HGB A1C): Hemoglobin A1C: 5.4 % (ref 4.0–5.6)

## 2022-09-16 MED ORDER — FERROUS GLUCONATE 324 (38 FE) MG PO TABS: 324.0000 mg | ORAL_TABLET | ORAL | 1 refills | Status: DC

## 2022-09-16 NOTE — Assessment & Plan Note (Signed)
Established problem Ferritin of 22 concerning for iron deficiency.   Recommend Ferrous Gluc 324 tab, every other day for 6 months.  Prescription sent Recheck ferritin 6 months

## 2022-09-16 NOTE — Assessment & Plan Note (Signed)
At risk DMT2 (Obesity, Hypertension, ethnicity) Lab Results  Component Value Date   HGBA1C 5.4 09/16/2022   No DMT2 diagnosis

## 2022-09-16 NOTE — Patient Instructions (Signed)
Your blood pressure control looks good.   Keep taking your losartan.  I am glad it is also helping your headaches.   Your blood work showed you were slightly iron deficient, please take one iron tablet every other day with or without food.  If you can, take for 6 months.  This will make sure your body iron stores are filled back up.  We are testing you for diabetes today.  Dr Morrell Fluke will call you if it is abnormal, otherwise he will send you a message through MyChart to let you know it was okay.    Schedule appointment in our [facility] to have your skin tags removed.    Dr Mayreli Alden would like to see you back in 6 months.

## 2022-09-16 NOTE — Progress Notes (Signed)
[name] is alone Sources of clinical information for visit is/are patient. Nursing assessment for this office visit was reviewed with the patient for accuracy and revision.    Previous Report(s) Reviewed: recent labs, cbc, bmet, ferrtin, lipids     09/16/2022    8:21 AM  Depression screen PHQ 2/9  Decreased Interest 0  Down, Depressed, Hopeless 0  PHQ - 2 Score 0  Altered sleeping 0  Tired, decreased energy 0  Change in appetite 0  Feeling bad or failure about yourself  0  Trouble concentrating 0  Moving slowly or fidgety/restless 0  Suicidal thoughts 0  PHQ-9 Score 0   Flowsheet Row Office Visit from 09/16/2022 in [facility] Office Visit from 07/15/2022 in [facility] Office Visit from 05/10/2022 in [facility]  Thoughts that you would be better off dead, or of hurting yourself in some way Not at all Not at all Not at all  PHQ-9 Total Score 0 1 0          10/27/2021    9:28 AM 01/19/2019    2:13 PM 07/17/2018    8:45 AM 01/02/2018    4:28 PM 09/28/2017    3:07 PM  Fall Risk   Falls in the past year? 0 0 0 No No  Number falls in past yr: 0 0     Injury with Fall? 0      Follow up  Falls evaluation completed          09/16/2022    8:21 AM 07/15/2022   10:43 AM 05/10/2022    9:42 AM  PHQ9 SCORE ONLY  PHQ-9 Total Score 0 1 0    There are no preventive care reminders to display for this patient.  Health Maintenance Due  Topic Date Due   Medicare Annual Wellness (AWV)  Never done   PAP SMEAR-Modifier  09/24/2022      History/P.E. limitations: none  There are no preventive care reminders to display for this patient. There are no preventive care reminders to display for this patient.  Health Maintenance Due  Topic Date Due   Medicare Annual Wellness (AWV)  Never done   PAP SMEAR-Modifier  09/24/2022     Chief Complaint  Patient presents with   Follow-up    BP   Hypertension      --------------------------------------------------------------------------------------------------------------------------------------------- Visit Problem List with A/P  Anemia, iron deficiency Established problem Ferritin of 22 concerning for iron deficiency.   Recommend Ferrous Gluc 324 tab, every other day for 6 months.  Prescription sent Recheck ferritin 6 months  Screening for diabetes mellitus At risk DMT2 (Obesity, Hypertension, ethnicity) Lab Results  Component Value Date   HGBA1C 5.4 09/16/2022   No DMT2 diagnosis   30 minutes in time spent in previsit chart review, visit, and post-visit dosumentation

## 2022-09-21 ENCOUNTER — Emergency Department (HOSPITAL_COMMUNITY): Payer: No Typology Code available for payment source

## 2022-09-21 ENCOUNTER — Other Ambulatory Visit: Payer: Self-pay

## 2022-09-21 ENCOUNTER — Emergency Department (HOSPITAL_COMMUNITY)
Admission: EM | Admit: 2022-09-21 | Discharge: 2022-09-21 | Discharge status: Against Medical Advice | Payer: No Typology Code available for payment source | Attending: Emergency Medicine | Admitting: Emergency Medicine

## 2022-09-21 DIAGNOSIS — M25562 Pain in left knee: Secondary | ICD-10-CM | POA: Insufficient documentation

## 2022-09-21 DIAGNOSIS — M545 Low back pain, unspecified: Secondary | ICD-10-CM | POA: Diagnosis not present

## 2022-09-21 DIAGNOSIS — Y92410 Unspecified street and highway as the place of occurrence of the external cause: Secondary | ICD-10-CM | POA: Diagnosis not present

## 2022-09-21 DIAGNOSIS — M542 Cervicalgia: Secondary | ICD-10-CM | POA: Diagnosis not present
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
## 2022-09-25 ENCOUNTER — Encounter (HOSPITAL_COMMUNITY): Payer: Self-pay | Admitting: Emergency Medicine

## 2022-09-25 ENCOUNTER — Emergency Department (HOSPITAL_COMMUNITY): Payer: Medicare Other

## 2022-09-25 ENCOUNTER — Other Ambulatory Visit: Payer: Self-pay

## 2022-09-25 ENCOUNTER — Emergency Department (HOSPITAL_COMMUNITY)
Admission: EM | Admit: 2022-09-25 | Discharge: 2022-09-25 | Disposition: A | Discharge status: Home / Self Care | Payer: Medicare Other | Attending: Emergency Medicine | Admitting: Emergency Medicine

## 2022-09-25 DIAGNOSIS — R9431 Abnormal electrocardiogram [ECG] [EKG]: Secondary | ICD-10-CM | POA: Diagnosis not present

## 2022-09-25 DIAGNOSIS — R109 Unspecified abdominal pain: Secondary | ICD-10-CM | POA: Diagnosis not present

## 2022-09-25 LAB — COMPREHENSIVE METABOLIC PANEL
ALT: 12 U/L (ref 0–44)
AST: 15 U/L (ref 15–41)
Albumin: 3.6 g/dL (ref 3.5–5.0)
Alkaline Phosphatase: 58 U/L (ref 38–126)
Anion gap: 7 (ref 5–15)
BUN: 9 mg/dL (ref 6–20)
CO2: 22 mmol/L (ref 22–32)
Calcium: 8.9 mg/dL (ref 8.9–10.3)
Chloride: 107 mmol/L (ref 98–111)
Creatinine, Ser: 0.62 mg/dL (ref 0.44–1.00)
GFR, Estimated: >60 mL/min (ref >60)
Glucose, Bld: 93 mg/dL (ref 70–99)
Potassium: 3.5 mmol/L (ref 3.5–5.1)
Sodium: 136 mmol/L (ref 135–145)
Total Bilirubin: 0.5 mg/dL (ref 0.3–1.2)
Total Protein: 7.5 g/dL (ref 6.5–8.1)

## 2022-09-25 LAB — CBC
HCT: 36.8 % (ref 36.0–46.0)
Hemoglobin: 12 g/dL (ref 12.0–15.0)
MCH: 26.2 pg (ref 26.0–34.0)
MCHC: 32.6 g/dL (ref 30.0–36.0)
MCV: 80.3 fL (ref 80.0–100.0)
Platelets: 297 10*3/uL (ref 150–400)
RBC: 4.58 MIL/uL (ref 3.87–5.11)
RDW: 14.9 % (ref 11.5–15.5)
WBC: 5.1 10*3/uL (ref 4.0–10.5)
nRBC: 0 % (ref 0.0–0.2)

## 2022-09-25 LAB — URINALYSIS, ROUTINE W REFLEX MICROSCOPIC
Bacteria, UA: NONE SEEN
Bilirubin Urine: NEGATIVE
Glucose, UA: NEGATIVE mg/dL
Ketones, ur: NEGATIVE mg/dL
Leukocytes,Ua: NEGATIVE
Nitrite: NEGATIVE
Protein, ur: NEGATIVE mg/dL
Specific Gravity, Urine: 1.02 (ref 1.005–1.030)
pH: 5 (ref 5.0–8.0)

## 2022-09-25 LAB — LIPASE, BLOOD: Lipase: 39 U/L (ref 11–51)

## 2022-09-25 MED ORDER — NAPROXEN 500 MG PO TABS: 500.0000 mg | ORAL_TABLET | Freq: Two times a day (BID) | ORAL | 0 refills | Status: DC

## 2022-09-25 MED ORDER — METHOCARBAMOL 500 MG PO TABS: 1000.0000 mg | ORAL_TABLET | Freq: Four times a day (QID) | ORAL | 0 refills | Status: DC

## 2022-09-25 MED ORDER — IOHEXOL 350 MG/ML SOLN
75.0000 mL | Freq: Once | INTRAVENOUS | Status: AC | PRN
  Administered 2022-09-25: 75 mL via INTRAVENOUS
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
## 2022-09-25 NOTE — Discharge Instructions (Signed)
Please read and follow all provided instructions.  Your diagnoses today include:  1. Left flank pain     Tests performed today include: Blood cell counts and platelets Kidney and liver function tests Pancreas function test (called lipase) Urine test to look for infection CT scan of your abdomen pelvis: Was essentially normal without any signs of trauma or other intra-abdominal problems, you do have a small pulmonary nodule which is unchanged over the past 10 years and is considered benign Vital signs. See below for your results today.   Medications prescribed:  Robaxin (methocarbamol) - muscle relaxer medication  DO NOT drive or perform any activities that require you to be awake and alert because this medicine can make you drowsy.   Naproxen - anti-inflammatory pain medication Do not exceed 500mg  naproxen every 12 hours, take with food  You have been prescribed an anti-inflammatory medication or NSAID. Take with food. Take smallest effective dose for the shortest duration needed for your pain. Stop taking if you experience stomach pain or vomiting.   Take any prescribed medications only as directed.  Home care instructions:  Follow any educational materials contained in this packet.  Follow-up instructions: Please follow-up with your primary care provider in the next 5-7 days for further evaluation of your symptoms if they are not improving.   Return instructions:  SEEK IMMEDIATE MEDICAL ATTENTION IF: The pain does not go away or becomes severe  A temperature above 101F develops  Repeated vomiting occurs (multiple episodes)  The pain becomes localized to portions of the abdomen. The right side could possibly be appendicitis. In an adult, the left lower portion of the abdomen could be colitis or diverticulitis.  Blood is being passed in stools or vomit (bright red or black tarry stools)  You develop chest pain, difficulty breathing, dizziness or fainting, or become confused,  poorly responsive, or inconsolable (young children) If you have any other emergent concerns regarding your health  Additional Information: Abdominal (belly) pain can be caused by many things. Your caregiver performed an examination and possibly ordered blood/urine tests and imaging (CT scan, x-rays, ultrasound). Many cases can be observed and treated at home after initial evaluation in the emergency department. Even though you are being discharged home, abdominal pain can be unpredictable. Therefore, you need a repeated exam if your pain does not resolve, returns, or worsens. Most patients with abdominal pain don't have to be admitted to the hospital or have surgery, but serious problems like appendicitis and gallbladder attacks can start out as nonspecific pain. Many abdominal conditions cannot be diagnosed in one visit, so follow-up evaluations are very important.  Your vital signs today were: BP 134/85   Pulse 71   Temp 98.2 F (36.8 C)   Resp 14   Ht 5\' 3"  (1.6 m)   Wt 88.9 kg   LMP 01/25/2022   SpO2 100%   BMI 34.72 kg/m  If your blood pressure (bp) was elevated above 135/85 this visit, please have this repeated by your doctor within one month. --------------
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
## 2022-09-30 ENCOUNTER — Ambulatory Visit: Payer: Medicare Other

## 2022-09-30 ENCOUNTER — Telehealth: Payer: Self-pay

## 2022-09-30 NOTE — Telephone Encounter (Signed)
Transition Care Management Unsuccessful Follow-up Telephone Call  Date of discharge and from where:  09/25/2022, [facility]  Attempts:  1st Attempt  Reason for unsuccessful TCM follow-up call:  Left voice message  [facility] [place]  [facility] Population Health Community Resource Care Guide   [email or web address]  ?? [phone]   Website: [email or web address]  [email or web address]      

## 2022-10-01 ENCOUNTER — Telehealth: Payer: Self-pay

## 2022-10-01 NOTE — Telephone Encounter (Signed)
Transition Care Management Follow-up Telephone Call Date of discharge and from where: 09/25/2022, [facility] How have you been since you were released from the hospital? Patient stated she is still experiencing pain and stiffness Any questions or concerns? No  Items Reviewed: Did the pt receive and understand the discharge instructions provided? Yes  Medications obtained and verified? Yes  Other? No  Any new allergies since your discharge? No  Dietary orders reviewed? Yes Do you have support at home? Yes   Follow up appointments reviewed:  PCP Hospital f/u appt confirmed?  Patient plans on calling to schedule an appointment.  Scheduled to see  on  @ . Specialist Hospital f/u appt confirmed? No  Scheduled to see  on  @ . Are transportation arrangements needed? No  If their condition worsens, is the pt aware to call PCP or go to the Emergency Dept.? Yes Was the patient provided with contact information for the PCP's office or ED? Yes Was to pt encouraged to call back with questions or concerns? Yes  [facility]  [facility] Population Health Community Resource Care Guide   ??[email or web address]  ?? [phone]   Website: [email or web address]  [email or web address]

## 2022-10-09 ENCOUNTER — Emergency Department (HOSPITAL_COMMUNITY)
Admission: EM | Admit: 2022-10-09 | Discharge: 2022-10-09 | Discharge status: Against Medical Advice | Payer: Medicare Other | Attending: Emergency Medicine | Admitting: Emergency Medicine

## 2022-10-09 ENCOUNTER — Emergency Department (HOSPITAL_COMMUNITY): Payer: Medicare Other

## 2022-10-09 DIAGNOSIS — R0781 Pleurodynia: Secondary | ICD-10-CM | POA: Diagnosis not present

## 2022-10-09 DIAGNOSIS — Z041 Encounter for examination and observation following transport accident: Secondary | ICD-10-CM | POA: Diagnosis not present

## 2022-10-09 DIAGNOSIS — R10811 Right upper quadrant abdominal tenderness: Secondary | ICD-10-CM | POA: Diagnosis not present

## 2022-10-09 DIAGNOSIS — Y92481 Parking lot as the place of occurrence of the external cause: Secondary | ICD-10-CM | POA: Insufficient documentation

## 2022-10-09 DIAGNOSIS — M549 Dorsalgia, unspecified: Secondary | ICD-10-CM | POA: Insufficient documentation

## 2022-10-09 DIAGNOSIS — M545 Low back pain, unspecified: Secondary | ICD-10-CM | POA: Diagnosis not present

## 2022-10-09 DIAGNOSIS — Z86000 Personal history of in-situ neoplasm of breast: Secondary | ICD-10-CM | POA: Diagnosis not present

## 2022-10-09 MED ORDER — KETOROLAC TROMETHAMINE 60 MG/2ML IM SOLN
60.0000 mg | Freq: Once | INTRAMUSCULAR | Status: AC
  Administered 2022-10-09: 60 mg via INTRAMUSCULAR
  Filled 2022-10-09: qty 2
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
## 2022-10-09 NOTE — ED Notes (Addendum)
Unable to locate patient at room and at ER by RN/Staff. EDP notified.
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
## 2022-10-11 ENCOUNTER — Ambulatory Visit (INDEPENDENT_AMBULATORY_CARE_PROVIDER_SITE_OTHER): Payer: Medicare Other | Admitting: Family Medicine

## 2022-10-11 VITALS — BP 140/79 | HR 68 | Ht 63.0 in | Wt 199.4 lb

## 2022-10-11 DIAGNOSIS — M545 Low back pain, unspecified: Secondary | ICD-10-CM

## 2022-10-11 HISTORY — DX: Low back pain, unspecified: M54.50

## 2022-10-11 MED ORDER — CYCLOBENZAPRINE HCL 5 MG PO TABS
5.0000 mg | ORAL_TABLET | Freq: Every day | ORAL | 0 refills | Status: DC | PRN
Start: 2022-10-11 — End: 2022-10-12
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
## 2022-10-12 ENCOUNTER — Ambulatory Visit
Admission: EM | Admit: 2022-10-12 | Discharge: 2022-10-12 | Disposition: A | Discharge status: Home / Self Care | Payer: Medicare Other | Attending: Emergency Medicine | Admitting: Emergency Medicine

## 2022-10-12 DIAGNOSIS — H8110 Benign paroxysmal vertigo, unspecified ear: Secondary | ICD-10-CM

## 2022-10-12 DIAGNOSIS — N898 Other specified noninflammatory disorders of vagina: Secondary | ICD-10-CM | POA: Diagnosis not present

## 2022-10-12 MED ORDER — METRONIDAZOLE 500 MG PO TABS: 500.0000 mg | ORAL_TABLET | Freq: Two times a day (BID) | ORAL | 0 refills | Status: DC

## 2022-10-12 MED ORDER — MECLIZINE HCL 25 MG PO TABS: 25.0000 mg | ORAL_TABLET | Freq: Three times a day (TID) | ORAL | 0 refills | Status: DC | PRN

## 2022-10-12 NOTE — Discharge Instructions (Signed)
Call your primary care office and let them know you have vertigo and that when you had it before, you needed therapy to help it - see if they can set that up for you.   Don't drive while you are dizzy!  Use the dizzy medicine only if needed.
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
## 2022-10-12 NOTE — ED Triage Notes (Signed)
Patient with c/o dizziness and lightheadedness since last night. Patient states she had vertigo years ago. States when she closes her eyes it's like the room is spinning.

## 2022-10-13 LAB — CERVICOVAGINAL ANCILLARY ONLY
Bacterial Vaginitis (gardnerella): POSITIVE — AB
Candida Glabrata: NEGATIVE
Candida Vaginitis: NEGATIVE
Chlamydia: NEGATIVE
Comment: NEGATIVE
Comment: NEGATIVE
Comment: NEGATIVE
Comment: NEGATIVE
Comment: NEGATIVE
Comment: NORMAL
Neisseria Gonorrhea: NEGATIVE
Trichomonas: NEGATIVE

## 2022-10-14 ENCOUNTER — Ambulatory Visit (INDEPENDENT_AMBULATORY_CARE_PROVIDER_SITE_OTHER): Payer: Medicare Other | Admitting: Family Medicine

## 2022-10-14 VITALS — BP 128/82 | HR 66 | Wt 196.0 lb

## 2022-10-14 DIAGNOSIS — L918 Other hypertrophic disorders of the skin: Secondary | ICD-10-CM

## 2022-10-14 NOTE — Patient Instructions (Addendum)
Ms. Rubottom,  We removed your skin tags today. This should help prevent any more irritation from your collar.  You may have some stinging in this area but that should go away over the next few hours.  Keep Band-Aid on it for the next 2 hours but otherwise you can bathe and go about your daily activities as normal.  J Dorothyann Gibbs, MD

## 2022-10-14 NOTE — Assessment & Plan Note (Signed)
Shared decision making.  Elected to remove the 3 largest skin tags and leave the 2 very small in place as they are not particularly bothersome to her. 3 skin tags were removed using scissors.  No local anesthetic was used.  Hemostasis achieved with pressure and adhesive bandage.  Patient tolerated the procedure well.  Reviewed aftercare.  No follow-up needed at this time.

## 2022-10-14 NOTE — Progress Notes (Addendum)
    SUBJECTIVE:   CHIEF COMPLAINT / HPI:   Skin Tags Multiple skin tags around the collar region of her neck.  These are bothersome to her as they get caught on her clothing and become irritated.  She is here requesting removal.  Not on any blood thinners.    OBJECTIVE:   BP 128/82   Pulse 66   Wt 196 lb (88.9 kg)   LMP 01/25/2022   SpO2 99%   BMI 34.72 kg/m   Gen: In good spirits, NAD Skin: 5 skin tags on the neck, 4 on the patient's left side and 1 on the right side.  The 1 on the right and one of the skin tags on the left are quite small, less than~0.70mm.  The largest is ~81mm.   ASSESSMENT/PLAN:   [employer] Shared decision making.  Elected to remove the 3 largest skin tags and leave the 2 very small in place as they are not particularly bothersome to her. 3 skin tags were removed using scissors.  No local anesthetic was used.  Hemostasis achieved with pressure and adhesive bandage.  Patient tolerated the procedure well.  Reviewed aftercare.  No follow-up needed at this time.     Eliezer Mccoy, MD [facility]   I was present during key history taking and physical exam and any procedures.  I agree with the note above Pauline Good MD
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
## 2022-11-11 NOTE — Patient Instructions (Signed)

## 2022-11-12 ENCOUNTER — Ambulatory Visit (INDEPENDENT_AMBULATORY_CARE_PROVIDER_SITE_OTHER): Payer: Medicare Other

## 2022-11-12 DIAGNOSIS — Z0000 Encounter for general adult medical examination without abnormal findings: Secondary | ICD-10-CM | POA: Diagnosis not present

## 2022-12-11 ENCOUNTER — Emergency Department (HOSPITAL_COMMUNITY): Payer: Medicare Other

## 2022-12-11 ENCOUNTER — Encounter (HOSPITAL_COMMUNITY): Payer: Self-pay | Admitting: *Deleted

## 2022-12-11 ENCOUNTER — Other Ambulatory Visit: Payer: Self-pay

## 2022-12-11 ENCOUNTER — Emergency Department (HOSPITAL_COMMUNITY)
Admission: EM | Admit: 2022-12-11 | Discharge: 2022-12-11 | Disposition: A | Discharge status: Home / Self Care | Payer: Medicare Other | Attending: Student | Admitting: Student

## 2022-12-11 DIAGNOSIS — S199XXA Unspecified injury of neck, initial encounter: Secondary | ICD-10-CM | POA: Diagnosis present

## 2022-12-11 DIAGNOSIS — M542 Cervicalgia: Secondary | ICD-10-CM | POA: Diagnosis not present

## 2022-12-11 DIAGNOSIS — Y92410 Unspecified street and highway as the place of occurrence of the external cause: Secondary | ICD-10-CM | POA: Insufficient documentation

## 2022-12-11 DIAGNOSIS — S134XXA Sprain of ligaments of cervical spine, initial encounter: Secondary | ICD-10-CM | POA: Diagnosis not present

## 2022-12-11 DIAGNOSIS — M47812 Spondylosis without myelopathy or radiculopathy, cervical region: Secondary | ICD-10-CM | POA: Diagnosis not present

## 2022-12-11 MED ORDER — CYCLOBENZAPRINE HCL 5 MG PO TABS: 5.0000 mg | ORAL_TABLET | Freq: Three times a day (TID) | ORAL | 0 refills | Status: DC | PRN

## 2022-12-11 MED ORDER — KETOROLAC TROMETHAMINE 10 MG PO TABS: 10.0000 mg | ORAL_TABLET | Freq: Four times a day (QID) | ORAL | 0 refills | Status: DC | PRN

## 2022-12-11 MED ORDER — KETOROLAC TROMETHAMINE 15 MG/ML IJ SOLN
30.0000 mg | Freq: Once | INTRAMUSCULAR | Status: AC
  Administered 2022-12-11: 30 mg via INTRAMUSCULAR
  Filled 2022-12-11: qty 2

## 2022-12-11 MED ORDER — CYCLOBENZAPRINE HCL 10 MG PO TABS
5.0000 mg | ORAL_TABLET | Freq: Once | ORAL | Status: AC
  Administered 2022-12-11: 5 mg via ORAL
  Filled 2022-12-11: qty 1

## 2022-12-11 NOTE — Discharge Instructions (Addendum)
Please follow-up with your primary care doctor, return if you have any numbness, tingling down bilateral arms, or new onset weakness.  Believe that you have a cervical strain, please take the Toradol for your pain control, do not take it with ibuprofen, naproxen or other NSAIDs.  Also I prescribed you muscle relaxant, do not use this when operating heavy machinery, as it will impair your judgment.
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
## 2022-12-16 ENCOUNTER — Telehealth: Payer: Self-pay

## 2022-12-16 NOTE — Telephone Encounter (Signed)
Transition Care Management Follow-up Telephone Call Date of discharge and from where: [facility] 7/13 How have you been since you were released from the hospital? Doing well Any questions or concerns? No  Items Reviewed: Did the pt receive and understand the discharge instructions provided? Yes  Medications obtained and verified? Yes  Other? No  Any new allergies since your discharge? No  Dietary orders reviewed? No Do you have support at home? Yes     Follow up appointments reviewed:  PCP Hospital f/u appt confirmed? Yes  Scheduled to see  on  @ . Specialist Hospital f/u appt confirmed? No  Scheduled to see  on  @ . Are transportation arrangements needed? No  If their condition worsens, is the pt aware to call PCP or go to the Emergency Dept.? Yes Was the patient provided with contact information for the PCP's office or ED? Yes Was to pt encouraged to call back with questions or concerns? Yes

## 2023-01-20 ENCOUNTER — Ambulatory Visit
Admission: EM | Admit: 2023-01-20 | Discharge: 2023-01-20 | Disposition: A | Discharge status: Home / Self Care | Payer: Medicare Other | Attending: Physician Assistant | Admitting: Physician Assistant

## 2023-01-20 DIAGNOSIS — M25511 Pain in right shoulder: Secondary | ICD-10-CM | POA: Diagnosis not present

## 2023-01-20 MED ORDER — CYCLOBENZAPRINE HCL 10 MG PO TABS: 10.0000 mg | ORAL_TABLET | Freq: Two times a day (BID) | ORAL | 0 refills | Status: DC | PRN

## 2023-01-20 MED ORDER — PREDNISONE 20 MG PO TABS: 40.0000 mg | ORAL_TABLET | Freq: Every day | ORAL | 0 refills | Status: AC
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
## 2023-01-20 NOTE — ED Triage Notes (Signed)
"  My right shoulder has been hurting really bad since Monday". No injury known.

## 2023-03-15 ENCOUNTER — Ambulatory Visit: Payer: Medicare Other

## 2023-03-28 ENCOUNTER — Ambulatory Visit: Payer: Medicare Other | Admitting: Student

## 2023-03-28 VITALS — BP 139/75 | HR 65 | Ht 63.0 in | Wt 200.4 lb

## 2023-03-28 DIAGNOSIS — G44229 Chronic tension-type headache, not intractable: Secondary | ICD-10-CM | POA: Diagnosis not present

## 2023-03-28 MED ORDER — NAPROXEN 500 MG PO TABS
500.0000 mg | ORAL_TABLET | Freq: Two times a day (BID) | ORAL | 0 refills | Status: AC
Start: 2023-03-28 — End: 2023-03-31

## 2023-03-28 NOTE — Progress Notes (Signed)
    SUBJECTIVE:   CHIEF COMPLAINT / HPI:   Victoria Rodriguez is a 42 y.o. female  presenting for headache present for 2 weeks.  She reports prior headaches in the past when her blood pressures been uncontrolled however she has been compliant with losartan 50 mg daily and denies having issues with her blood pressure recently.  She denies recent trauma or medication changes 2 weeks ago on the onset of her headache.  She reports she has taken NSAIDs with mild relief but her headache returns.  She denies nasal congestion, nausea, vomiting, changes in vision.  PERTINENT  PMH / PSH: Reviewed and updated   OBJECTIVE:   BP 139/75   Pulse 65   Ht 5\' 3"  (1.6 m)   Wt 200 lb 6.4 oz (90.9 kg)   LMP 01/25/2022   SpO2 100%   BMI 35.50 kg/m   Well-appearing, no acute distress Cardio: Regular rate, regular rhythm, no murmurs on exam. Pulm: Clear, no wheezing, no crackles. No increased work of breathing Abdominal: bowel sounds present, soft, non-tender, non-distended Extremities: no peripheral edema  Neuro: alert and oriented x3, speech normal in content, no facial asymmetry, strength intact and equal bilaterally in UE and LE, pupils equal and reactive to light.  Psych:  Cognition and judgment appear intact. Alert, communicative  and cooperative with normal attention span and concentration. No apparent delusions, illusions, hallucinations      03/28/2023    3:33 PM 11/12/2022    8:34 AM 11/12/2022    8:26 AM  PHQ9 SCORE ONLY  PHQ-9 Total Score 3 0 0      ASSESSMENT/PLAN:   No problem-specific Assessment & Plan notes found for this encounter.   Headache: Most likely this is tension type headache with the duration and no red flags on physical exam.  Will prescribe a 3-day course of Naprosyn 500 mg twice daily to help break the headache.  Patient to also get her vision checked.  Discussed returning for evaluation in 2 weeks if her headache has not improved.  Can consider head imaging at that  time to rule out intracranial process.  Low suspicion at this time as headache is mild without neurological changes.  With her history of anemia we will check CBC today.  Glendale Chard, DO [facility]

## 2023-03-28 NOTE — Patient Instructions (Signed)
I am prescribing a medication called Naprosyn that you should take twice a day for the next 3 days.  This is intended to break your headache.  I encourage you to get your vision checked as this could be contributing to your symptoms.  If your headache is not better in 2 weeks I recommend you come back and follow-up for additional investigation.  I am checking labs today I will send you a message via MyChart with the results.

## 2023-03-29 LAB — CBC
Hematocrit: 37.3 % (ref 34.0–46.6)
Hemoglobin: 11.7 g/dL (ref 11.1–15.9)
MCH: 26.8 pg (ref 26.6–33.0)
MCHC: 31.4 g/dL — ABNORMAL LOW (ref 31.5–35.7)
MCV: 85 fL (ref 79–97)
Platelets: 298 10*3/uL (ref 150–450)
RBC: 4.37 x10E6/uL (ref 3.77–5.28)
RDW: 14.1 % (ref 11.7–15.4)
WBC: 5.3 10*3/uL (ref 3.4–10.8)

## 2023-03-31 ENCOUNTER — Ambulatory Visit (HOSPITAL_COMMUNITY)
Admission: RE | Admit: 2023-03-31 | Discharge: 2023-03-31 | Disposition: A | Discharge status: Home / Self Care | Payer: Medicare Other | Source: Ambulatory Visit | Attending: Family Medicine | Admitting: Family Medicine

## 2023-03-31 ENCOUNTER — Telehealth: Payer: Self-pay

## 2023-03-31 ENCOUNTER — Ambulatory Visit: Payer: Medicare Other | Admitting: Family Medicine

## 2023-03-31 VITALS — BP 152/98 | HR 83 | Ht 63.0 in | Wt 202.2 lb

## 2023-03-31 DIAGNOSIS — G44201 Tension-type headache, unspecified, intractable: Secondary | ICD-10-CM

## 2023-03-31 DIAGNOSIS — T434X5A Adverse effect of butyrophenone and thiothixene neuroleptics, initial encounter: Secondary | ICD-10-CM | POA: Insufficient documentation

## 2023-03-31 DIAGNOSIS — I10 Essential (primary) hypertension: Secondary | ICD-10-CM

## 2023-03-31 DIAGNOSIS — T887XXA Unspecified adverse effect of drug or medicament, initial encounter: Secondary | ICD-10-CM | POA: Diagnosis not present

## 2023-03-31 NOTE — Telephone Encounter (Signed)
Patient calls nurse line in regards to Naproxen.   She reports she has been taking this for 3 days now for headaches. However, she reports it is not working well for her.   She would like to know what else she can "add on" OTC to help elevate symptoms.   Advised will forward to PCP.

## 2023-03-31 NOTE — Patient Instructions (Signed)
It was wonderful to see you today.  Please bring ALL of your medications with you to every visit.   Today we talked about:  Side effects of wrong medication - I will follow up regarding the labs we got today. The effects of the medication should slowly leave your system as the medicine is cleared by your body.   Please follow up in about 2 weeks to 1 month to follow up for blood pressure   Thank you for choosing [facility].   Please call [phone] with any questions about today's appointment.  Lockie Mola, MD  Family Medicine

## 2023-03-31 NOTE — Progress Notes (Signed)
    SUBJECTIVE:   CHIEF COMPLAINT / HPI:   Medication Ingestion  Patient picked up a medication from the pharmacy. She was given the wrong medication.  The medication was for a different patient. She was given 2 mg haloperidol. She took this medication on Tuesday evening and after putting it in her mouth noticed it was not the right medication. She tried to spit out whatever was left in her mouth.  Since then she has been having Palpitations and headache. No syncope or light headedness. No vomiting. No chest pain or shortness of breath.   Hypertension  Patient was able to return the haldol bottle and get her proper medication (losartan) on Tuesday. She has been taking losartan 50 since then.   PERTINENT  PMH / PSH: HTN  OBJECTIVE:   BP (!) 152/98   Pulse 83   Ht 5\' 3"  (1.6 m)   Wt 202 lb 3.2 oz (91.7 kg)   LMP 01/25/2022   SpO2 100%   BMI 35.82 kg/m   General: well appearing, in no acute distress CV: RRR, radial pulses equal and palpable, no BLE edema  Resp: Normal work of breathing on room air, CTAB Abd: Soft, non tender, non distended  Psyc: anxious    ASSESSMENT/PLAN:   Assessment & Plan Adverse effect of haloperidol, initial encounter Given that partial ingestion of haldol was 2 days ago unlikely that patient is still having side effects. However, could have caused some QT prolongation causing palpitations. Symptoms have been improving according to patient. Symptoms could be multifactorial with side effects and anxiety from taking the medication.  Picture of medication bottle is in media tab  EKG was normal sinus rhythm with normal QT interval  - BMP, Mag  Primary hypertension Hypertension is uncontrolled at this time; however, patient is currently anxious and blood pressure in office today could be elevated for this reason.  - Follow up in one week for BP check  - BMP - Follow up in one month for hypertension fu       Lockie Mola, MD [facility]

## 2023-04-01 LAB — BASIC METABOLIC PANEL
BUN/Creatinine Ratio: 17 (ref 9–23)
BUN: 11 mg/dL (ref 6–24)
CO2: 24 mmol/L (ref 20–29)
Calcium: 9.4 mg/dL (ref 8.7–10.2)
Chloride: 103 mmol/L (ref 96–106)
Creatinine, Ser: 0.66 mg/dL (ref 0.57–1.00)
Glucose: 75 mg/dL (ref 70–99)
Potassium: 3.8 mmol/L (ref 3.5–5.2)
Sodium: 140 mmol/L (ref 134–144)
eGFR: 112 mL/min/{1.73_m2} (ref >59)

## 2023-04-01 LAB — MAGNESIUM: Magnesium: 2.1 mg/dL (ref 1.6–2.3)

## 2023-04-01 MED ORDER — TIZANIDINE HCL 4 MG PO TABS: 4.0000 mg | ORAL_TABLET | Freq: Three times a day (TID) | ORAL | 0 refills | Status: AC

## 2023-04-01 MED ORDER — NAPROXEN 500 MG PO TABS: 500.0000 mg | ORAL_TABLET | Freq: Two times a day (BID) | ORAL | 0 refills | Status: AC

## 2023-04-01 NOTE — Telephone Encounter (Signed)
I spoke with Victoria Rodriguez about her prolonged daily headaches. They do seem to have qualities of tension type headache.  Will try to break this cycle of headaces with an additional 7 days of Naprosyn 500 mg twice a day with Tizanidine 4 mg three times a day for 7 days.    I warned Victoria Rodriguez to try the tizanidine at home prior to driving to see how it effected her.   If Victoria Rodriguez is not improved\ after this trial, I will need to see her in the office.

## 2023-04-04 NOTE — Assessment & Plan Note (Signed)
Hypertension is uncontrolled at this time; however, patient is currently anxious and blood pressure in office today could be elevated for this reason.  - Follow up in one week for BP check  - BMP - Follow up in one month for hypertension fu

## 2023-05-23 DIAGNOSIS — H5200 Hypermetropia, unspecified eye: Secondary | ICD-10-CM | POA: Diagnosis not present

## 2023-06-06 ENCOUNTER — Ambulatory Visit (INDEPENDENT_AMBULATORY_CARE_PROVIDER_SITE_OTHER): Payer: Medicare Other | Admitting: Family Medicine

## 2023-06-06 ENCOUNTER — Other Ambulatory Visit (HOSPITAL_COMMUNITY)
Admission: RE | Admit: 2023-06-06 | Discharge: 2023-06-06 | Disposition: A | Discharge status: Home / Self Care | Payer: Medicare Other | Source: Ambulatory Visit | Attending: Family Medicine | Admitting: Family Medicine

## 2023-06-06 VITALS — BP 121/74 | HR 80 | Ht 63.0 in | Wt 206.0 lb

## 2023-06-06 DIAGNOSIS — N760 Acute vaginitis: Secondary | ICD-10-CM | POA: Diagnosis not present

## 2023-06-06 DIAGNOSIS — Z113 Encounter for screening for infections with a predominantly sexual mode of transmission: Secondary | ICD-10-CM

## 2023-06-06 DIAGNOSIS — Z114 Encounter for screening for human immunodeficiency virus [HIV]: Secondary | ICD-10-CM

## 2023-06-06 DIAGNOSIS — B9689 Other specified bacterial agents as the cause of diseases classified elsewhere: Secondary | ICD-10-CM

## 2023-06-06 DIAGNOSIS — L732 Hidradenitis suppurativa: Secondary | ICD-10-CM | POA: Diagnosis not present

## 2023-06-06 LAB — POCT WET PREP (WET MOUNT)
Clue Cells Wet Prep Whiff POC: POSITIVE
Trichomonas Wet Prep HPF POC: ABSENT
WBC, Wet Prep HPF POC: NONE SEEN

## 2023-06-06 MED ORDER — DOXYCYCLINE HYCLATE 100 MG PO CAPS
100.0000 mg | ORAL_CAPSULE | Freq: Two times a day (BID) | ORAL | 0 refills | Status: AC
Start: 2023-06-06 — End: 2023-06-13

## 2023-06-06 MED ORDER — METRONIDAZOLE 500 MG PO TABS: 500.0000 mg | ORAL_TABLET | Freq: Two times a day (BID) | ORAL | 0 refills | Status: DC

## 2023-06-06 NOTE — Patient Instructions (Addendum)
 Please take doxycycline 2 times daily for 7 days. If no improvement, let us know and we can consider starting suppressive treatment and/or referring you to surgery  I will let you know if any results from today are abnormal

## 2023-06-06 NOTE — Assessment & Plan Note (Signed)
 Recurrent, last flare several months ago -Start doxy 100mg  BID x 7 days -If no improvement, consider suppressive therapy with doxy +/- spironolactone x 3 months

## 2023-06-06 NOTE — Progress Notes (Signed)
    SUBJECTIVE:   CHIEF COMPLAINT / HPI:   Hydradenitis flare - bilateral inguinal folds Has history of hydradenitis requiring surgery in her armpits Has taken doxy and clinda in the past, this feels similar to past flares Has had some recent drainage of pus  Painful, has not been using much for pain  Desires STD screening - no new partners or exposures, no abnormal discharge or pain/itching Hx tubal ligation 2017  PERTINENT  PMH / PSH: hydradenitis suppurativa, recurrent  OBJECTIVE:   BP 121/74   Pulse 80   Ht 5' 3 (1.6 m)   Wt 206 lb (93.4 kg)   LMP 01/25/2022   SpO2 100%   BMI 36.49 kg/m   General: NAD, pleasant, able to participate in exam Respiratory: No respiratory distress GU: 3-4cm boils noted in bilateral inguinal folds, both with some pus drainage and minimal scarring/tunneling, mildly tender to palpation Skin: warm and dry, no rashes noted Psych: Normal affect and mood  Pelvic exam: VULVA: normal appearing vulva with no masses, tenderness or lesions, VAGINA: vaginal discharge - white and creamy, CERVIX: cervical discharge present - white, creamy, and scant, exam chaperoned by Thersia CMA.  ASSESSMENT/PLAN:   Assessment & Plan Hidradenitis Recurrent, last flare several months ago -Start doxy 100mg  BID x 7 days -If no improvement, consider suppressive therapy with doxy +/- spironolactone x 3 months Routine screening for STI (sexually transmitted infection) Asymptomatic. Some discharge noted on exam -Wet prep positive for BV - prescribed flagyl  BID x7d in addition to doxy as above -HIV/RPR -Gonorrhea/chlamydia    Victoria Coward, MD [facility]

## 2023-06-07 LAB — RPR W/REFLEX TO TREPSURE

## 2023-06-07 LAB — HIV ANTIBODY (ROUTINE TESTING W REFLEX): HIV Screen 4th Generation wRfx: NONREACTIVE

## 2023-06-08 LAB — CERVICOVAGINAL ANCILLARY ONLY
Chlamydia: NEGATIVE
Comment: NEGATIVE
Comment: NEGATIVE
Comment: NORMAL
Neisseria Gonorrhea: NEGATIVE
Trichomonas: NEGATIVE

## 2023-06-08 LAB — RPR W/REFLEX TO TREPSURE

## 2023-06-08 LAB — TREPONEMAL ANTIBODIES, TPPA: Treponemal Antibodies, TPPA: NONREACTIVE

## 2023-06-20 ENCOUNTER — Encounter: Payer: Self-pay | Admitting: Emergency Medicine

## 2023-06-20 ENCOUNTER — Ambulatory Visit
Admission: EM | Admit: 2023-06-20 | Discharge: 2023-06-20 | Disposition: A | Discharge status: Home / Self Care | Payer: Medicare Other

## 2023-06-20 DIAGNOSIS — B3731 Acute candidiasis of vulva and vagina: Secondary | ICD-10-CM

## 2023-06-20 MED ORDER — FLUCONAZOLE 150 MG PO TABS: 150.0000 mg | ORAL_TABLET | Freq: Once | ORAL | 0 refills | Status: AC

## 2023-06-20 NOTE — ED Triage Notes (Signed)
Pt reports severe vaginal itching x4 days. Pt recently had STD testing (swab & blood work) performed on 06/05/2022 and was only positive for bacterial vaginosis. Finished metronidazole course 4 days before itching symptoms started. No dysuria or discharge noted. No changes in soaps or detergents in home.

## 2023-06-20 NOTE — Discharge Instructions (Signed)
Get Monostat cream to apply externally as needed
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
## 2023-06-30 ENCOUNTER — Ambulatory Visit: Payer: Medicare Other | Admitting: Family Medicine

## 2023-07-11 ENCOUNTER — Other Ambulatory Visit: Payer: Self-pay

## 2023-07-11 ENCOUNTER — Emergency Department (HOSPITAL_COMMUNITY): Payer: Medicare Other

## 2023-07-11 ENCOUNTER — Encounter: Payer: Self-pay | Admitting: Family Medicine

## 2023-07-11 ENCOUNTER — Emergency Department (HOSPITAL_COMMUNITY)
Admission: EM | Admit: 2023-07-11 | Discharge: 2023-07-11 | Discharge status: Against Medical Advice | Payer: Medicare Other | Attending: Emergency Medicine | Admitting: Emergency Medicine

## 2023-07-11 ENCOUNTER — Ambulatory Visit (INDEPENDENT_AMBULATORY_CARE_PROVIDER_SITE_OTHER): Payer: Medicare Other | Admitting: Family Medicine

## 2023-07-11 ENCOUNTER — Encounter (HOSPITAL_COMMUNITY): Payer: Self-pay | Admitting: *Deleted

## 2023-07-11 VITALS — BP 128/80 | HR 100 | Ht 63.0 in | Wt 204.0 lb

## 2023-07-11 DIAGNOSIS — Z5321 Procedure and treatment not carried out due to patient leaving prior to being seen by health care provider: Secondary | ICD-10-CM | POA: Insufficient documentation

## 2023-07-11 DIAGNOSIS — M7741 Metatarsalgia, right foot: Secondary | ICD-10-CM | POA: Diagnosis not present

## 2023-07-11 DIAGNOSIS — M79674 Pain in right toe(s): Secondary | ICD-10-CM | POA: Diagnosis not present

## 2023-07-11 DIAGNOSIS — J101 Influenza due to other identified influenza virus with other respiratory manifestations: Secondary | ICD-10-CM

## 2023-07-11 DIAGNOSIS — M79671 Pain in right foot: Secondary | ICD-10-CM | POA: Diagnosis not present

## 2023-07-11 LAB — POC SOFIA 2 FLU + SARS ANTIGEN FIA
Influenza A, POC: NEGATIVE
Influenza B, POC: POSITIVE — AB
SARS Coronavirus 2 Ag: NEGATIVE

## 2023-07-11 MED ORDER — OSELTAMIVIR PHOSPHATE 75 MG PO CAPS: 75.0000 mg | ORAL_CAPSULE | Freq: Two times a day (BID) | ORAL | 0 refills | Status: AC

## 2023-07-11 NOTE — ED Triage Notes (Signed)
 The pt had her rt  toes pulled back in November and since then she is unable to bend her toes without pain  the pt had brought her daughter to this ed  to be checked and  decided to check in to have her foot seen  lmp none

## 2023-07-11 NOTE — Assessment & Plan Note (Signed)
 Exam and history consistent with metatarsalgia. Doubt there is additional tendinous injury to flexor or extensor tendons of 1-4th digits. May have some component of 2-4th MTP joint capsule inflammation/OA. Plan below discussed with patient who is agreeable: -Referral to Sports Medicine for metatarsal pads and diagnostic US  -Continue prn Ibuprofen 

## 2023-07-11 NOTE — Progress Notes (Signed)
    SUBJECTIVE:   CHIEF COMPLAINT / HPI: toe broken  Daughter diagnosed with flu on Friday. Started feeling bad yesterday. Felt ill. Cough and chills. No difficulty breathing. No nausea or vomiting.  Toes In November. Friend "popped" her toes akin to "cracking" knuckles. Friend bent toes in plantar flexion. Had pain afterwards. Felt pop at the time. First 3 toe on right foot.  Progressively worsening and not improving. Got xray in ED. Bent toes down.  Difficulty walking.   PERTINENT  PMH / PSH: HTN  OBJECTIVE:   BP 128/80   Pulse 100   Ht 5\' 3"  (1.6 m)   Wt 204 lb (92.5 kg)   LMP 01/25/2022   SpO2 98%   BMI 36.14 kg/m   General: NAD Neuro: A&O HEENT: no posterior oropharyngeal erythema, no exudates Cardiovascular: RRR, no murmurs, no peripheral edema Respiratory: normal WOB on RA, CTAB, no wheezes, ronchi or rales Extremities: Moving all 4 extremities equally  Right foot Inspection: Loss of transverse arch of right foot, No obvious deformity, erythema, swelling, ecchymoses Palpation: NTTP at 2nd 3rd 4th metarsal heads dorsally and laterally, no palpable morton's neuroma ROM: full of toes in dorsiflexion and plantar flexion, does elicit pain Special Tests: metatarsal roll positive Strength: strength decreased toe plantar and dorsiflexion  Right Foot XR - No signs of metatarsal or phalanx fracture across all digits.  ASSESSMENT/PLAN:   Assessment & Plan Influenza B Positive for influenza B. Reassuring exam and vitals, consistent with viral illness. Doubt pneumonia, AOM, strep. Counseled on supportive care.  Plan below discussed with patient is agreeable. -Tamiflu  75mg  for 5 days twice daily Metatarsalgia of right foot Exam and history consistent with metatarsalgia. Doubt there is additional tendinous injury to flexor or extensor tendons of 1-4th digits. May have some component of 2-4th MTP joint capsule inflammation/OA. Plan below discussed with patient who  is agreeable: -Referral to Sports Medicine for metatarsal pads and diagnostic US  -Continue prn Ibuprofen   Return in about 4 weeks (around 08/08/2023).  Ivin Marrow, MD [facility]

## 2023-07-11 NOTE — Patient Instructions (Addendum)
 It was great to see you! Thank you for allowing me to participate in your care!  Our plans for today:  - You have metatarsalgia, this is causing the pain in your foot. - You may continue taking ibuprofen  for pain. - I have placed a referral to sports medicine for your ultrasound. You may call them to schedule at [phone]. - You have a viral illness causing your symptoms (probably the flu). - This will get better in the next several days. - You may use Tylenol  and Ibuprofen  as needed for pain. - Over the counter allergy medicine such as Claritin and Flonase may help with your congestion. - Cough drops may help with your throat and cough. - If you do not get better in the next 5 days please return to care. - It is important to stay hydrated, and continue eating while sick.    Please arrive 15 minutes PRIOR to your next scheduled appointment time! If you do not, this affects OTHER patients' care.  Take care and seek immediate care sooner if you develop any concerns.   Ivin Marrow, MD, PGY-2 [facility] 9:30 AM 07/11/2023  [facility]

## 2023-07-12 ENCOUNTER — Ambulatory Visit: Payer: Medicare Other | Admitting: Family Medicine

## 2023-07-13 ENCOUNTER — Ambulatory Visit: Payer: Medicare Other | Admitting: Sports Medicine

## 2023-07-28 ENCOUNTER — Encounter: Payer: Self-pay | Admitting: Family Medicine

## 2023-07-28 ENCOUNTER — Ambulatory Visit: Payer: Medicare Other | Admitting: Family Medicine

## 2023-07-28 ENCOUNTER — Ambulatory Visit (INDEPENDENT_AMBULATORY_CARE_PROVIDER_SITE_OTHER): Payer: Medicare Other | Admitting: Family Medicine

## 2023-07-28 VITALS — BP 135/80 | Ht 63.0 in | Wt 204.0 lb

## 2023-07-28 VITALS — BP 130/82 | HR 87 | Ht 63.0 in | Wt 203.2 lb

## 2023-07-28 DIAGNOSIS — M7741 Metatarsalgia, right foot: Secondary | ICD-10-CM | POA: Diagnosis not present

## 2023-07-28 DIAGNOSIS — Z23 Encounter for immunization: Secondary | ICD-10-CM

## 2023-07-28 DIAGNOSIS — L732 Hidradenitis suppurativa: Secondary | ICD-10-CM

## 2023-07-28 DIAGNOSIS — D509 Iron deficiency anemia, unspecified: Secondary | ICD-10-CM

## 2023-07-28 DIAGNOSIS — I10 Essential (primary) hypertension: Secondary | ICD-10-CM

## 2023-07-28 MED ORDER — NORGESTIMATE-ETH ESTRADIOL 0.25-35 MG-MCG PO TABS: 1.0000 | ORAL_TABLET | Freq: Every day | ORAL | 3 refills | Status: AC
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
## 2023-07-28 NOTE — Assessment & Plan Note (Signed)
 Right foot pain along metatarsal heads 1, 2, 3 consistent with metatarsalgia -Patient frequently wearing slides or Crocs as her footwear  Plan: -Reviewed office visit notes from 07/11/2023 with Dr. Velna Ochs.  Also reviewed ER triage note from 07/11/2023 -Reviewed right foot x-ray as noted above -Patient would benefit from sports insole with metatarsal pad on the right.  She was fitted with these today.  She did not have regular shoes with her, instructed her on how to appropriately fit these into her shoes.  She can trim around the toebox if needed.  Also explained how to adjust the metatarsal pad if needed -Recommended to wear supportive shoes/sneakers with good arch support. -Advised to avoid walking around barefoot -Heat or ice as needed -Continue ibuprofen as needed -Follow-up 4 to 6 weeks if worsening or not improving, otherwise as needed.  Could consider custom orthotics in the future if continues to have ongoing issues

## 2023-07-28 NOTE — Progress Notes (Unsigned)
 Victoria Rodriguez is {Pc accompanied by:5710} Sources of clinical information for visit is/are {Information source:60032}. Nursing assessment for this office visit was reviewed with the patient for accuracy and revision.     Previous Report(s) Reviewed: {Outside review:15817}     07/11/2023    9:02 AM  Depression screen PHQ 2/9  Decreased Interest 0  Down, Depressed, Hopeless 0  PHQ - 2 Score 0  Altered sleeping 0  Tired, decreased energy 0  Change in appetite 0  Feeling bad or failure about yourself  0  Trouble concentrating 0  Moving slowly or fidgety/restless 0  Suicidal thoughts 0  PHQ-9 Score 0   Flowsheet Row Office Visit from 07/11/2023 in [facility] - [facility] Of [employer]. [facility] Office Visit from 06/06/2023 in [facility] Of [place]. [facility] Office Visit from 03/31/2023 in [facility] - A Dept Of . [facility]  Thoughts that you would be better off dead, or of hurting yourself in some way Not at all Not at all Not at all  PHQ-9 Total Score 0 0 1          11/12/2022    8:26 AM 10/27/2021    9:28 AM 01/19/2019    2:13 PM 07/17/2018    8:45 AM 01/02/2018    4:28 PM  Fall Risk   Falls in the past year? 0 0 0 0 No  Number falls in past yr: 0 0 0    Injury with Fall? 0 0     Risk for fall due to : No Fall Risks      Follow up Falls evaluation completed  Falls evaluation completed         07/11/2023    9:02 AM 06/06/2023    3:41 PM 03/31/2023    2:26 PM  PHQ9 SCORE ONLY  PHQ-9 Total Score 0 0 1    There are no preventive care reminders to display for this patient.  Health Maintenance Due  Topic Date Due   COVID-19 Vaccine (4 - 2024-25 season) 01/30/2023      History/P.E. limitations: {exam; limitations ed:60112}  There are no preventive care reminders to display for this patient. There are no preventive care reminders to display for this patient.  Health  Maintenance Due  Topic Date Due   COVID-19 Vaccine (4 - 2024-25 season) 01/30/2023     No chief complaint on file.    --------------------------------------------------------------------------------------------------------------------------------------------- Visit Problem List with A/P  No problem-specific Assessment & Plan notes found for this encounter.

## 2023-07-28 NOTE — Patient Instructions (Signed)
 Please start Spintec (oral contraceptive pill) to decrease your Hidradenitis.   Should you develop leg swelling, leg redness, significant and persistent leg pain, shortness of breath, or chest pain, then stop the Sprintec and contact Dr Hays Dunnigan's office.   We are checking your hemoglobin and iron levels today..   Dr Pamlea Finder will let you know it they are abnormal.  If they are normal, he will send your a message through your MyChart

## 2023-07-29 ENCOUNTER — Other Ambulatory Visit: Payer: Self-pay | Admitting: Student

## 2023-07-29 ENCOUNTER — Encounter: Payer: Self-pay | Admitting: Family Medicine

## 2023-07-29 ENCOUNTER — Ambulatory Visit: Payer: Self-pay

## 2023-07-29 DIAGNOSIS — M79661 Pain in right lower leg: Secondary | ICD-10-CM

## 2023-07-29 LAB — FERRITIN: Ferritin: 44 ng/mL (ref 15–150)

## 2023-07-29 LAB — CBC
Hematocrit: 41.9 % (ref 34.0–46.6)
Hemoglobin: 13.1 g/dL (ref 11.1–15.9)
MCH: 27.1 pg (ref 26.6–33.0)
MCHC: 31.3 g/dL — ABNORMAL LOW (ref 31.5–35.7)
MCV: 87 fL (ref 79–97)
Platelets: 335 10*3/uL (ref 150–450)
RBC: 4.84 x10E6/uL (ref 3.77–5.28)
RDW: 13.8 % (ref 11.7–15.4)
WBC: 4.1 10*3/uL (ref 3.4–10.8)

## 2023-07-29 NOTE — Assessment & Plan Note (Signed)
 Established problem. Adequate blood pressure control.  No evidence of new end organ damage.  Tolerating medication without significant adverse effects.  Plan to continue current blood pressure medication regiment.

## 2023-07-29 NOTE — Assessment & Plan Note (Addendum)
 Established problem Flare 06/06/23 treated successfully with Doxy Recurrent tender sore buttock crease left side. Currently inactive. Ms Winbush has some Doxy on hand she takes when she feels a HD attack coming on.  Patient is S/P hysterectomy for fibroids No history of clots, liver disease, migraine, CVA/MI, Breast cancer, never smoked She took OCP when young without difficulty Intolerant of low dose metformin  Plan Trial Ortho-Cylen 0.25-0.35 28 day pack. Skip placebo days. To suppress testosterone leading to HD flares.  Reviewed precautions for new OCP including signs snd symptoms of DVT/PE/CVA/MI RTC 3 months to review tolerance and efficancy

## 2023-07-29 NOTE — Assessment & Plan Note (Signed)
    Latest Ref Rng & Units 07/28/2023    9:20 AM 03/28/2023    5:25 PM 09/25/2022    6:40 AM  CBC  WBC 3.4 - 10.8 x10E3/uL 4.1  5.3  5.1   Hemoglobin 11.1 - 15.9 g/dL 47.4  25.9  56.3   Hematocrit 34.0 - 46.6 % 41.9  37.3  36.8   Platelets 150 - 450 x10E3/uL 335  298  297    Iron/TIBC/Ferritin/ %Sat    Component Value Date/Time   IRON 26 (L) 11/21/2017 1029   TIBC 349 11/21/2017 1029   FERRITIN 44 07/28/2023 0920   IRONPCTSAT 7 (LL) 11/21/2017 1029   IDA resolved with good level of iron stores based on ferrtin level. Without ongoing blood losses recommend stopping iron supplementation; Next year will recommend colonoscopy screening CRC

## 2023-09-23 ENCOUNTER — Other Ambulatory Visit: Payer: Self-pay

## 2023-09-23 ENCOUNTER — Emergency Department (HOSPITAL_COMMUNITY)
Admission: EM | Admit: 2023-09-23 | Discharge: 2023-09-23 | Discharge status: Against Medical Advice | Attending: Emergency Medicine | Admitting: Emergency Medicine

## 2023-09-23 DIAGNOSIS — M542 Cervicalgia: Secondary | ICD-10-CM | POA: Diagnosis present

## 2023-09-23 DIAGNOSIS — Z5321 Procedure and treatment not carried out due to patient leaving prior to being seen by health care provider: Secondary | ICD-10-CM | POA: Diagnosis not present

## 2023-09-23 DIAGNOSIS — M549 Dorsalgia, unspecified: Secondary | ICD-10-CM | POA: Diagnosis not present

## 2023-09-23 DIAGNOSIS — Y92410 Unspecified street and highway as the place of occurrence of the external cause: Secondary | ICD-10-CM | POA: Diagnosis not present

## 2023-09-23 DIAGNOSIS — M79602 Pain in left arm: Secondary | ICD-10-CM | POA: Diagnosis not present

## 2023-09-23 NOTE — ED Notes (Signed)
Pt did not answer when called for room 

## 2023-09-23 NOTE — ED Notes (Signed)
Pt did not answer for vitals.  

## 2023-09-23 NOTE — ED Notes (Signed)
 No answer for  Vs recheck

## 2023-09-23 NOTE — ED Notes (Signed)
 Called for vitals no answer
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
## 2023-09-24 ENCOUNTER — Other Ambulatory Visit: Payer: Self-pay

## 2023-09-24 ENCOUNTER — Emergency Department (HOSPITAL_COMMUNITY)
Admission: EM | Admit: 2023-09-24 | Discharge: 2023-09-24 | Disposition: A | Discharge status: Home / Self Care | Attending: Emergency Medicine | Admitting: Emergency Medicine

## 2023-09-24 ENCOUNTER — Encounter (HOSPITAL_COMMUNITY): Payer: Self-pay | Admitting: Emergency Medicine

## 2023-09-24 DIAGNOSIS — I10 Essential (primary) hypertension: Secondary | ICD-10-CM | POA: Insufficient documentation

## 2023-09-24 DIAGNOSIS — M545 Low back pain, unspecified: Secondary | ICD-10-CM | POA: Diagnosis not present

## 2023-09-24 DIAGNOSIS — Y92410 Unspecified street and highway as the place of occurrence of the external cause: Secondary | ICD-10-CM | POA: Diagnosis not present

## 2023-09-24 DIAGNOSIS — Z79899 Other long term (current) drug therapy: Secondary | ICD-10-CM | POA: Insufficient documentation

## 2023-09-24 DIAGNOSIS — M542 Cervicalgia: Secondary | ICD-10-CM | POA: Diagnosis present

## 2023-09-24 DIAGNOSIS — S134XXA Sprain of ligaments of cervical spine, initial encounter: Secondary | ICD-10-CM | POA: Insufficient documentation

## 2023-09-24 MED ORDER — CYCLOBENZAPRINE HCL 5 MG PO TABS: 5.0000 mg | ORAL_TABLET | Freq: Three times a day (TID) | ORAL | 0 refills | Status: DC | PRN

## 2023-09-24 MED ORDER — KETOROLAC TROMETHAMINE 15 MG/ML IJ SOLN
15.0000 mg | Freq: Once | INTRAMUSCULAR | Status: AC
  Administered 2023-09-24: 15 mg via INTRAMUSCULAR
  Filled 2023-09-24: qty 1
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
## 2023-09-24 NOTE — Discharge Instructions (Addendum)
 Please take Tylenol  (acetaminophen ) and Motrin  (ibuprofen ) for your symptoms at home.  You can take 1000 mg of Tylenol  every 6 hours and 600 mg of Motrin  every 6 hours as needed for your symptoms.  You can take these medicines together as needed, either at the same time, or alternating every 3 hours.  We have also prescribed you some muscle relaxer to take as needed for muscle pain.  This can make you drowsy, so do not mix it with alcohol or drive while taking this medication.

## 2023-09-27 ENCOUNTER — Ambulatory Visit: Admitting: Family Medicine

## 2023-09-27 VITALS — BP 127/83 | HR 80 | Ht 63.0 in | Wt 207.2 lb

## 2023-09-27 DIAGNOSIS — M545 Low back pain, unspecified: Secondary | ICD-10-CM | POA: Diagnosis not present

## 2023-09-27 DIAGNOSIS — S161XXD Strain of muscle, fascia and tendon at neck level, subsequent encounter: Secondary | ICD-10-CM

## 2023-09-27 MED ORDER — KETOROLAC TROMETHAMINE 30 MG/ML IJ SOLN
30.0000 mg | Freq: Once | INTRAMUSCULAR | Status: AC
  Administered 2023-09-27: 30 mg via INTRAMUSCULAR

## 2023-09-27 MED ORDER — CYCLOBENZAPRINE HCL 5 MG PO TABS: 5.0000 mg | ORAL_TABLET | Freq: Three times a day (TID) | ORAL | 0 refills | Status: DC | PRN

## 2023-09-27 NOTE — Patient Instructions (Signed)
 It was wonderful to see you today! Thank you for choosing [facility].   Please bring ALL of your medications with you to every visit.   Today we talked about:  For your neck and back pain we are giving you a Toradol  shot today so please do not start using any ibuprofen  until tomorrow.  You can use ibuprofen  600 mg 4 times per day safely for the next week.  You can also use Tylenol  1000 mg up to 4 times per day.  I also refilled the Flexeril  that will be available at your pharmacy as needed.  I also ordered the imaging of your neck and low back, you can complete at [address] at your convenience and I will let you know the results.  Please also provide the work accommodation note but as I mentioned this will likely just take time to get better.  Please follow up as needed for persistent symptoms  Call the clinic at [phone] if your symptoms worsen or you have any concerns.  Please be sure to schedule follow up at the front desk before you leave today.   Jonne Netters, DO Family Medicine
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
## 2023-09-28 ENCOUNTER — Ambulatory Visit
Admission: RE | Admit: 2023-09-28 | Discharge: 2023-09-28 | Disposition: A | Discharge status: Home / Self Care | Source: Ambulatory Visit | Attending: Family Medicine

## 2023-09-28 ENCOUNTER — Telehealth: Payer: Self-pay | Admitting: Family Medicine

## 2023-09-28 DIAGNOSIS — M545 Low back pain, unspecified: Secondary | ICD-10-CM

## 2023-09-28 DIAGNOSIS — S161XXD Strain of muscle, fascia and tendon at neck level, subsequent encounter: Secondary | ICD-10-CM

## 2023-09-28 DIAGNOSIS — M542 Cervicalgia: Secondary | ICD-10-CM | POA: Diagnosis not present

## 2023-09-28 NOTE — Telephone Encounter (Signed)
 Patient dropped off FMLA paperwork to be completed. Last DOS was 09/27/23. Placed in [employer].

## 2023-09-29 ENCOUNTER — Encounter: Payer: Self-pay | Admitting: Family Medicine

## 2023-09-29 NOTE — Telephone Encounter (Signed)
Left some forms in your box that the patient needs completed. 

## 2023-09-30 ENCOUNTER — Telehealth: Payer: Self-pay | Admitting: Family Medicine

## 2023-09-30 ENCOUNTER — Telehealth: Payer: Self-pay

## 2023-09-30 NOTE — Telephone Encounter (Signed)
 FMLA completed and put in pile for faxing to [facility] benefits dept
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
## 2023-09-30 NOTE — Telephone Encounter (Signed)
 Scheduled patient and made an appointment with her. She will see Dr.Maxwell May 16th @350 

## 2023-10-07 ENCOUNTER — Telehealth: Payer: Self-pay

## 2023-10-07 NOTE — Telephone Encounter (Signed)
 Patient calls nurse line in regards to [facility].   She reports her company received the paperwork faxed off by PCP, however reports they are "missing" the return to work date and any possible restrictions.   Advised per note, it appears any modifications would be made at next FU apt for 5/16.  She reports this is fine, however her work note ends for today.   She is requesting an extension to cover her through next week, until she is seen on 5/16.  Advised will forward to PCP.

## 2023-10-10 ENCOUNTER — Ambulatory Visit: Admitting: Student

## 2023-10-10 ENCOUNTER — Telehealth: Payer: Self-pay | Admitting: Family Medicine

## 2023-10-10 DIAGNOSIS — S39012D Strain of muscle, fascia and tendon of lower back, subsequent encounter: Secondary | ICD-10-CM

## 2023-10-10 DIAGNOSIS — S161XXD Strain of muscle, fascia and tendon at neck level, subsequent encounter: Secondary | ICD-10-CM

## 2023-10-10 NOTE — Telephone Encounter (Signed)
 Patient came into office requesting to speak to Dr. McDiarmid. Patient would like doctor to call her ASAP.   Patient is seeing Dr. Telford Feather today 10/10/2023

## 2023-10-10 NOTE — Telephone Encounter (Signed)
 I called Ms Kiehl.
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
## 2023-10-11 ENCOUNTER — Telehealth: Payer: Self-pay

## 2023-10-11 NOTE — Telephone Encounter (Signed)
-----   Message from [facility] McDiarmid sent at 10/10/2023 12:14 PM EDT ----- Regarding: Schedule appointment Kingsley Penny,  Please schedule Ms Merrell an appointment with me on 11/10/23.  It is okay to double book.   Thank you Ena Harries

## 2023-10-11 NOTE — Telephone Encounter (Signed)
 Patient is double booked on 06/12 @10 :00 a.m

## 2023-10-12 ENCOUNTER — Telehealth: Payer: Self-pay | Admitting: Family Medicine

## 2023-10-12 NOTE — Telephone Encounter (Signed)
 Patient came in stating that she received an email stating that her FMLA paperwork was not correct. There seemed to be confusion on the dates. They are asking for a letter, signed by her doctor, stating the dates of leave needed and  confirmation of when she is expected to return to work. She also asks if Dr. McDiarmid could please call her.

## 2023-10-14 ENCOUNTER — Ambulatory Visit: Admitting: Student

## 2023-10-14 ENCOUNTER — Ambulatory Visit (INDEPENDENT_AMBULATORY_CARE_PROVIDER_SITE_OTHER): Admitting: Student

## 2023-10-14 VITALS — BP 127/71 | HR 73 | Ht 63.0 in | Wt 209.4 lb

## 2023-10-14 DIAGNOSIS — S161XXD Strain of muscle, fascia and tendon at neck level, subsequent encounter: Secondary | ICD-10-CM | POA: Diagnosis present

## 2023-10-14 NOTE — Patient Instructions (Signed)
 It was great to see you today! Thank you for choosing [facility] for your primary care.  Today we addressed: Physical therapy is the correct next step.  You may also take Tylenol  1000 mg 4 times daily as needed and ibuprofen  600 mg every 8 hours daily as needed. Consider pre-medicating prior to PT appointments.  If you haven't already, sign up for My Chart to have easy access to your labs results, and communication with your primary care physician.  Please arrive 15 minutes before your appointment to ensure smooth check in process.  We appreciate your efforts in making this happen.  Thank you for allowing me to participate in your care, Veronia Goon, DO 10/14/2023, 3:33 PM PGY-3, [facility]

## 2023-10-14 NOTE — Telephone Encounter (Signed)
 I have fax'd my third attempt at completion of [employer]-380-[employer] FMLA for Victoria Rodriguez.
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
## 2023-10-25 NOTE — Telephone Encounter (Signed)
 Patient presents to clinic today regarding FMLA paperwork.  Provided patient with updated copy of FMLA paperwork.   She needs this to be faxed to [phone]. Patient completed ROI.   She reports that she will also need a letter stating her return to work date and if there are any restrictions.   Current end date on form is 12/04/23.  I will go ahead and re-fax the current paperwork. Forwarding to Dr. McDiarmid for letter.   Once letter is completed, we can then fax this to her employer.   Elsie Halo, RN

## 2023-10-26 ENCOUNTER — Encounter: Payer: Self-pay | Admitting: Family Medicine

## 2023-10-31 ENCOUNTER — Ambulatory Visit
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
## 2023-11-10 ENCOUNTER — Ambulatory Visit (INDEPENDENT_AMBULATORY_CARE_PROVIDER_SITE_OTHER): Admitting: Family Medicine

## 2023-11-10 ENCOUNTER — Encounter: Payer: Self-pay | Admitting: Family Medicine

## 2023-11-10 VITALS — BP 128/82 | HR 86 | Ht 63.0 in | Wt 208.4 lb

## 2023-11-10 DIAGNOSIS — S39012D Strain of muscle, fascia and tendon of lower back, subsequent encounter: Secondary | ICD-10-CM | POA: Diagnosis not present

## 2023-11-10 DIAGNOSIS — S161XXD Strain of muscle, fascia and tendon at neck level, subsequent encounter: Secondary | ICD-10-CM

## 2023-11-10 NOTE — Patient Instructions (Signed)
 Dr Clay Solum will work on your return to work Physicist, medical and the letter for being out of work with [employer].   He will post the letters to you through MyChart
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
## 2023-11-11 ENCOUNTER — Encounter: Payer: Self-pay | Admitting: Family Medicine

## 2023-11-11 DIAGNOSIS — S39012D Strain of muscle, fascia and tendon of lower back, subsequent encounter: Secondary | ICD-10-CM | POA: Insufficient documentation
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
## 2023-11-12 ENCOUNTER — Other Ambulatory Visit: Payer: Self-pay | Admitting: Family Medicine

## 2023-11-12 DIAGNOSIS — R030 Elevated blood-pressure reading, without diagnosis of hypertension: Secondary | ICD-10-CM

## 2023-11-14 ENCOUNTER — Encounter: Payer: Self-pay | Admitting: Family Medicine

## 2023-11-21 ENCOUNTER — Ambulatory Visit

## 2023-11-21 VITALS — Ht 63.0 in | Wt 208.0 lb

## 2023-11-21 DIAGNOSIS — Z0000 Encounter for general adult medical examination without abnormal findings: Secondary | ICD-10-CM
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
## 2023-11-21 NOTE — Patient Instructions (Signed)
 Ms. Everitt , Thank you for taking time out of your busy schedule to complete your Annual Wellness Visit with me. I enjoyed our conversation and look forward to speaking with you again next year. I, as well as your care team,  appreciate your ongoing commitment to your health goals. Please review the following plan we discussed and let me know if I can assist you in the future. Your Game plan/ To Do List     Follow up Visits: Next Medicare AWV with our clinical staff: In 1 year    Have you seen your provider in the last 6 months (3 months if uncontrolled diabetes)? Yes Next Office Visit with your provider: To be scheduled  Clinician Recommendations:  Aim for 30 minutes of exercise or brisk walking, 6-8 glasses of water, and 5 servings of fruits and vegetables each day.       This is a list of the screening recommended for you and due dates:  Health Maintenance  Topic Date Due   HPV Vaccine (1 - Risk 3-dose SCDM series) Never done   Flu Shot  12/30/2023   COVID-19 Vaccine (5 - Pfizer risk 2024-25 season) 01/25/2024   Medicare Annual Wellness Visit  11/20/2024   DTaP/Tdap/Td vaccine (3 - Td or Tdap) 12/08/2025   Hepatitis C Screening  Completed   HIV Screening  Completed   Meningitis B Vaccine  Aged Out    Advanced directives: (ACP Link)Information on Advanced Care Planning can be found at [place]  Secretary of [facility] Advance Health Care Directives Advance Health Care Directives. [email or web address]  Advance Care Planning is important because it:  [x]  Makes sure you receive the medical care that is consistent with your values, goals, and preferences  [x]  It provides guidance to your family and loved ones and reduces their decisional burden about whether or not they are making the right decisions based on your wishes.  Follow the link provided in your after visit summary or read over the paperwork we have mailed to you to help you started getting your Advance Directives in place. If you need  assistance in completing these, please reach out to us  so that we can help you!  See attachments for Preventive Care and Fall Prevention Tips.

## 2023-12-12 ENCOUNTER — Ambulatory Visit (INDEPENDENT_AMBULATORY_CARE_PROVIDER_SITE_OTHER)

## 2023-12-12 VITALS — BP 136/78 | HR 79 | Ht 63.0 in | Wt 209.4 lb

## 2023-12-12 DIAGNOSIS — M542 Cervicalgia: Secondary | ICD-10-CM

## 2023-12-12 MED ORDER — LIDOCAINE 5 % EX PTCH: 1.0000 | MEDICATED_PATCH | CUTANEOUS | 0 refills | Status: DC
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
## 2023-12-12 NOTE — Patient Instructions (Signed)
 Thank you for visiting the clinic today, it was good to see you! In today's visit we discussed:  Neck pain: I have ordered an MRI for you to get at [facility] health, given your recent sudden loss of strength in the legs resulting in a fall, I am concerned about a soft tissue cervical injury.  I have also given you a prescription for pain relief patches, to put on the affected area.  Please follow-up in 4-8 weeks  For any questions, please call the office at [phone] or send me a message in MyChart. Have a great day!  -Fairy Amy, MD  [facility] Family Medicine Resident, PGY-1

## 2023-12-14 ENCOUNTER — Telehealth: Payer: Self-pay

## 2023-12-14 NOTE — Telephone Encounter (Signed)
 Pharmacy Patient Advocate Encounter   Received notification from CoverMyMeds that prior authorization for LIDOCAINE  5% PATCHES is required/requested.   Insurance verification completed.   The patient is insured through [facility] .   PA required; PA submitted to above mentioned insurance via CoverMyMeds Key/confirmation #/EOC [id number]. Status is pending

## 2023-12-15 NOTE — Telephone Encounter (Signed)
 Pharmacy Patient Advocate Encounter  Received notification from [employer] that Prior Authorization for LIDOCAINE  5% PATCHES has been DENIED.  Full denial letter will be uploaded to the media tab. See denial reason below.  This drug used for CERVICALGIA is not an approved use. Medicare Part D rules states the drug must be used for a medically-accepted indication. A medically-accepted indication means a use that is approved by the Food and Drug Administration (FDA), or a use supported by specific resources.   LIDOCAINE  4% PATCHES ARE AVAILABLE OTC  PA #/Case ID/Reference #: [id number]

## 2023-12-22 ENCOUNTER — Encounter: Payer: Self-pay | Admitting: Family Medicine

## 2023-12-22 ENCOUNTER — Telehealth: Payer: Self-pay | Admitting: Family Medicine

## 2023-12-22 NOTE — Telephone Encounter (Signed)
Requested letter sent to patient via MyChart.

## 2023-12-22 NOTE — Telephone Encounter (Signed)
 Patient came in saying she needs a letter from her doctor stating she was our from April 25,2025-July 6,2025 for her FMLA paper. If any question call patient. Please send a copy to My Chart and have one ready for pick up ASAP.  Thank you!

## 2023-12-23 ENCOUNTER — Ambulatory Visit (HOSPITAL_COMMUNITY)
Admission: RE | Admit: 2023-12-23 | Discharge: 2023-12-23 | Disposition: A | Discharge status: Home / Self Care | Source: Ambulatory Visit | Attending: Family Medicine | Admitting: Family Medicine

## 2023-12-23 DIAGNOSIS — M542 Cervicalgia: Secondary | ICD-10-CM | POA: Insufficient documentation

## 2023-12-26 ENCOUNTER — Other Ambulatory Visit: Payer: Self-pay | Admitting: Family Medicine

## 2023-12-30 ENCOUNTER — Ambulatory Visit: Payer: Self-pay

## 2024-01-03 ENCOUNTER — Telehealth: Payer: Self-pay | Admitting: Family Medicine

## 2024-01-03 ENCOUNTER — Encounter: Payer: Self-pay | Admitting: Family Medicine

## 2024-01-03 NOTE — Telephone Encounter (Signed)
 I spoke with Victoria Rodriguez.  She is requesting note to return to work without restriction  starting on 8/19.  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    Her recent cervical MRI was unremarkable.   Work note sent via [employer].

## 2024-01-04 ENCOUNTER — Ambulatory Visit
Admission: EM | Admit: 2024-01-04 | Discharge: 2024-01-04 | Disposition: A | Discharge status: Home / Self Care | Attending: Physician Assistant | Admitting: Physician Assistant

## 2024-01-04 DIAGNOSIS — N898 Other specified noninflammatory disorders of vagina: Secondary | ICD-10-CM | POA: Insufficient documentation

## 2024-01-04 DIAGNOSIS — N3001 Acute cystitis with hematuria: Secondary | ICD-10-CM | POA: Diagnosis not present

## 2024-01-04 DIAGNOSIS — Z114 Encounter for screening for human immunodeficiency virus [HIV]: Secondary | ICD-10-CM | POA: Insufficient documentation

## 2024-01-04 DIAGNOSIS — Z113 Encounter for screening for infections with a predominantly sexual mode of transmission: Secondary | ICD-10-CM | POA: Diagnosis not present

## 2024-01-04 DIAGNOSIS — R102 Pelvic and perineal pain: Secondary | ICD-10-CM | POA: Diagnosis not present

## 2024-01-04 LAB — POCT URINE DIPSTICK
Bilirubin, UA: NEGATIVE
Glucose, UA: NEGATIVE mg/dL
Ketones, POC UA: NEGATIVE mg/dL
Nitrite, UA: NEGATIVE
POC PROTEIN,UA: 30 — AB
Spec Grav, UA: >=1.03 — AB (ref 1.010–1.025)
Urobilinogen, UA: 0.2 U/dL
pH, UA: 6 (ref 5.0–8.0)

## 2024-01-04 LAB — POCT URINE PREGNANCY: Preg Test, Ur: NEGATIVE

## 2024-01-04 MED ORDER — NITROFURANTOIN MONOHYD MACRO 100 MG PO CAPS: 100.0000 mg | ORAL_CAPSULE | Freq: Two times a day (BID) | ORAL | 0 refills | Status: AC
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
## 2024-01-04 NOTE — ED Triage Notes (Signed)
 Starting yesterday it began with vaginal irritation and 1 episode of blood in urine when urinating, after urinating some pain/discomfort. No fever. Some abd pain (lower) after using the bathroom as well. No nausea or vomiting. Stools normal.

## 2024-01-05 ENCOUNTER — Ambulatory Visit: Payer: Self-pay

## 2024-01-05 LAB — CERVICOVAGINAL ANCILLARY ONLY
Bacterial Vaginitis (gardnerella): POSITIVE — AB
Candida Glabrata: NEGATIVE
Candida Vaginitis: POSITIVE — AB
Chlamydia: NEGATIVE
Comment: NEGATIVE
Comment: NEGATIVE
Comment: NEGATIVE
Comment: NEGATIVE
Comment: NEGATIVE
Comment: NORMAL
Neisseria Gonorrhea: NEGATIVE
Trichomonas: NEGATIVE

## 2024-01-05 LAB — HIV ANTIBODY (ROUTINE TESTING W REFLEX): HIV Screen 4th Generation wRfx: NONREACTIVE

## 2024-01-05 LAB — RPR: RPR Ser Ql: NONREACTIVE

## 2024-01-05 MED ORDER — FLUCONAZOLE 150 MG PO TABS: 150.0000 mg | ORAL_TABLET | Freq: Once | ORAL | 0 refills | Status: AC

## 2024-01-05 MED ORDER — METRONIDAZOLE 0.75 % VA GEL: 1.0000 | Freq: Every day | VAGINAL | 0 refills | Status: AC

## 2024-01-05 MED ORDER — METRONIDAZOLE 500 MG PO TABS: 500.0000 mg | ORAL_TABLET | Freq: Two times a day (BID) | ORAL | 0 refills | Status: DC

## 2024-01-06 LAB — URINE CULTURE: Culture: 40000 — AB

## 2024-01-06 MED ORDER — SULFAMETHOXAZOLE-TRIMETHOPRIM 800-160 MG PO TABS: 1.0000 | ORAL_TABLET | Freq: Two times a day (BID) | ORAL | 0 refills | Status: AC

## 2024-01-06 NOTE — Addendum Note (Signed)
 Addended by: ARNALDO ALFONSO RAMAN on: 01/06/2024 01:01 PM   Modules accepted: Orders
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
## 2024-01-10 ENCOUNTER — Ambulatory Visit: Attending: Family Medicine | Admitting: Physical Therapy

## 2024-02-10 ENCOUNTER — Other Ambulatory Visit: Payer: Self-pay | Admitting: Family Medicine

## 2024-02-10 DIAGNOSIS — R030 Elevated blood-pressure reading, without diagnosis of hypertension: Secondary | ICD-10-CM

## 2024-04-05 ENCOUNTER — Ambulatory Visit

## 2024-04-23 ENCOUNTER — Ambulatory Visit

## 2024-05-22 ENCOUNTER — Ambulatory Visit: Admitting: Family Medicine

## 2024-05-22 ENCOUNTER — Ambulatory Visit: Payer: Self-pay | Admitting: Family Medicine

## 2024-05-22 ENCOUNTER — Other Ambulatory Visit (HOSPITAL_COMMUNITY)
Admission: RE | Admit: 2024-05-22 | Discharge: 2024-05-22 | Disposition: A | Discharge status: Home / Self Care | Source: Ambulatory Visit

## 2024-05-22 ENCOUNTER — Encounter: Payer: Self-pay | Admitting: Family Medicine

## 2024-05-22 VITALS — BP 124/85 | HR 65 | Temp 98.1°F | Wt 205.6 lb

## 2024-05-22 DIAGNOSIS — Z113 Encounter for screening for infections with a predominantly sexual mode of transmission: Secondary | ICD-10-CM | POA: Diagnosis not present

## 2024-05-22 DIAGNOSIS — Z114 Encounter for screening for human immunodeficiency virus [HIV]: Secondary | ICD-10-CM

## 2024-05-22 DIAGNOSIS — M545 Low back pain, unspecified: Secondary | ICD-10-CM | POA: Diagnosis present

## 2024-05-22 LAB — POCT URINALYSIS DIP (MANUAL ENTRY)
Bilirubin, UA: NEGATIVE
Glucose, UA: NEGATIVE mg/dL
Leukocytes, UA: NEGATIVE
Nitrite, UA: NEGATIVE
Protein Ur, POC: NEGATIVE mg/dL
Spec Grav, UA: 1.025
Urobilinogen, UA: 0.2 U/dL
pH, UA: 5

## 2024-05-22 LAB — POCT WET PREP (WET MOUNT)
Clue Cells Wet Prep Whiff POC: NEGATIVE
Trichomonas Wet Prep HPF POC: ABSENT
WBC, Wet Prep HPF POC: NONE SEEN

## 2024-05-22 LAB — POCT UA - MICROSCOPIC ONLY: WBC, Ur, HPF, POC: NONE SEEN

## 2024-05-22 MED ORDER — CYCLOBENZAPRINE HCL 5 MG PO TABS: 5.0000 mg | ORAL_TABLET | Freq: Three times a day (TID) | ORAL | 0 refills | Status: AC | PRN

## 2024-05-22 MED ORDER — LIDOCAINE 5 % EX PTCH: 1.0000 | MEDICATED_PATCH | CUTANEOUS | 0 refills | Status: AC
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
## 2024-05-22 NOTE — Patient Instructions (Signed)
 If any of your results from today are abnormal and/or require changes to your medical care, I will give you a call. Otherwise, I will send you a letter in the mail or a message on MyChart.   You can use Tylenol  and ibuprofen  to help with your pain.  I will also send in a refill of Flexeril  and lidocaine  patches.  Use.  Please let us  know if your pain becomes worse, starts to spread, if you have abdominal pain or vomiting or fevers

## 2024-05-23 ENCOUNTER — Other Ambulatory Visit (HOSPITAL_COMMUNITY): Payer: Self-pay

## 2024-05-23 ENCOUNTER — Telehealth: Payer: Self-pay

## 2024-05-23 LAB — CERVICOVAGINAL ANCILLARY ONLY
Chlamydia: NEGATIVE
Comment: NEGATIVE
Comment: NEGATIVE
Comment: NORMAL
Neisseria Gonorrhea: NEGATIVE
Trichomonas: NEGATIVE

## 2024-05-23 LAB — SYPHILIS: RPR W/REFLEX TO RPR TITER AND TREPONEMAL ANTIBODIES, TRADITIONAL SCREENING AND DIAGNOSIS ALGORITHM: RPR Ser Ql: NONREACTIVE

## 2024-05-23 LAB — HIV ANTIBODY (ROUTINE TESTING W REFLEX): HIV Screen 4th Generation wRfx: NONREACTIVE

## 2024-05-23 NOTE — Telephone Encounter (Signed)
 Pharmacy Patient Advocate Encounter   Received notification from [employer] that prior authorization for LIDOCAINE  5% PATCHES is required/requested.   Insurance verification completed.   The patient is insured through [facility] MEDICARE.   Per test claim: PA required; PA submitted to above mentioned insurance via [employer] Key/confirmation #/EOC [id number]. Status is pending

## 2024-05-25 NOTE — Telephone Encounter (Signed)
 Pharmacy Patient Advocate Encounter  Received notification from [employer] that Prior Authorization for LIDOCAINE  5% PATCHES has been DENIED.  Full denial letter will be uploaded to the media tab. See denial reason below.  We denied coverage for this drug because:  This drug used for Low back pain, unspecified is not an approved use. Medicare Part D rules states the drug must be used for a medically-accepted indication. Therefore, this drug cannot be covered under your Medicare Part D benefit.   Lidocaine  4% patches are OTC  PA #/Case ID/Reference #: [id number]

## 2024-06-02 ENCOUNTER — Ambulatory Visit: Admission: EM | Admit: 2024-06-02 | Discharge: 2024-06-02 | Disposition: A | Discharge status: Home / Self Care

## 2024-06-02 ENCOUNTER — Other Ambulatory Visit: Payer: Self-pay

## 2024-06-02 ENCOUNTER — Encounter: Payer: Self-pay | Admitting: Emergency Medicine

## 2024-06-02 DIAGNOSIS — K649 Unspecified hemorrhoids: Secondary | ICD-10-CM | POA: Diagnosis not present

## 2024-06-02 NOTE — ED Triage Notes (Signed)
 Pt sts painful swollen area to right side of anus x 3 days; unsure if could be hemorrhoid; pt denies draining

## 2024-06-02 NOTE — Discharge Instructions (Addendum)
 Hemorrhoids (Non-Thrombosed)  What this is Hemorrhoids are swollen veins in or around the anus. Non-thrombosed hemorrhoids are not clotted and are usually managed with conservative, at-home treatments.  Common symptoms - Rectal itching or irritation - Mild pain or discomfort, especially with bowel movements - Swelling or a soft lump near the anus - Small amounts of bright red blood on toilet paper or in the toilet  Expected recovery - Symptoms often improve within a few days to 1-2 weeks - Regular bowel habits and supportive care help prevent recurrence - Some hemorrhoids may come and go over time  Treatment & Symptom Relief  Bowel habits - Avoid straining during bowel movements - Do not sit on the toilet for long periods - Go as soon as you feel the urge  Fiber and fluids - Increase dietary fiber (fruits, vegetables, whole grains) - Consider a fiber supplement if needed - Drink plenty of fluids to keep stools soft  Sitz baths - Warm water baths for 10-15 minutes, 2-3 times daily - Especially helpful after bowel movements  Topical treatments - Over-the-counter hemorrhoid creams or suppositories as needed - Witch hazel pads can reduce irritation and itching - Use products with steroid ingredients for no more than 7 days unless directed  Pain relief - Acetaminophen  or ibuprofen  as needed for discomfort  When to Seek care - Symptoms do not improve after 1-2 weeks - Increasing pain, swelling, or bleeding - Rectal bleeding that is frequent or heavy - Difficulty with bowel movements despite treatment -Feeling of swelling, redness, warmth around area or any concern for HS flare   When to Go to the Emergency Department - Severe or sudden rectal pain (possible thrombosis) - Heavy rectal bleeding or dizziness - Black or tarry stools - Fever with rectal pain or swelling
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
## 2024-11-26 ENCOUNTER — Encounter
